# Patient Record
Sex: Male | Born: 1950 | Race: Black or African American | Hispanic: No | Smoking: Former smoker
Health system: Southern US, Community
[De-identification: ages and names within clinical notes are randomized; demographics above are authoritative.]

## PROBLEM LIST (undated history)

## (undated) DIAGNOSIS — F1910 Other psychoactive substance abuse, uncomplicated: Secondary | ICD-10-CM

## (undated) DIAGNOSIS — K635 Polyp of colon: Secondary | ICD-10-CM

## (undated) DIAGNOSIS — I351 Nonrheumatic aortic (valve) insufficiency: Secondary | ICD-10-CM

## (undated) DIAGNOSIS — I639 Cerebral infarction, unspecified: Secondary | ICD-10-CM

## (undated) DIAGNOSIS — I10 Essential (primary) hypertension: Secondary | ICD-10-CM

## (undated) DIAGNOSIS — I219 Acute myocardial infarction, unspecified: Secondary | ICD-10-CM

## (undated) DIAGNOSIS — E785 Hyperlipidemia, unspecified: Secondary | ICD-10-CM

## (undated) DIAGNOSIS — R7303 Prediabetes: Secondary | ICD-10-CM

## (undated) DIAGNOSIS — E119 Type 2 diabetes mellitus without complications: Secondary | ICD-10-CM

## (undated) DIAGNOSIS — J189 Pneumonia, unspecified organism: Secondary | ICD-10-CM

## (undated) HISTORY — PX: COLON SURGERY: SHX602

## (undated) HISTORY — DX: Nonrheumatic aortic (valve) insufficiency: I35.1

## (undated) HISTORY — DX: Other psychoactive substance abuse, uncomplicated: F19.10

## (undated) HISTORY — PX: TONSILLECTOMY: SUR1361

## (undated) HISTORY — DX: Hyperlipidemia, unspecified: E78.5

## (undated) HISTORY — DX: Essential (primary) hypertension: I10

## (undated) HISTORY — PX: CAROTID ENDARTERECTOMY: SUR193

## (undated) HISTORY — DX: Polyp of colon: K63.5

## (undated) HISTORY — DX: Acute myocardial infarction, unspecified: I21.9

---
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
## 2001-05-22 ENCOUNTER — Emergency Department (HOSPITAL_COMMUNITY): Admission: EM | Admit: 2001-05-22 | Discharge: 2001-05-22 | Payer: Self-pay | Admitting: *Deleted

## 2001-05-22 ENCOUNTER — Encounter: Payer: Self-pay | Admitting: *Deleted

## 2001-05-24 ENCOUNTER — Encounter: Payer: Self-pay | Admitting: Emergency Medicine

## 2001-05-24 ENCOUNTER — Emergency Department (HOSPITAL_COMMUNITY): Admission: EM | Admit: 2001-05-24 | Discharge: 2001-05-24 | Payer: Self-pay | Admitting: Emergency Medicine

## 2001-05-25 ENCOUNTER — Emergency Department (HOSPITAL_COMMUNITY): Admission: EM | Admit: 2001-05-25 | Discharge: 2001-05-25 | Payer: Self-pay | Admitting: Emergency Medicine

## 2001-09-09 ENCOUNTER — Emergency Department (HOSPITAL_COMMUNITY): Admission: EM | Admit: 2001-09-09 | Discharge: 2001-09-09 | Payer: Self-pay | Admitting: *Deleted

## 2001-09-21 ENCOUNTER — Emergency Department (HOSPITAL_COMMUNITY): Admission: EM | Admit: 2001-09-21 | Discharge: 2001-09-21 | Payer: Self-pay | Admitting: Emergency Medicine

## 2001-09-22 ENCOUNTER — Emergency Department (HOSPITAL_COMMUNITY): Admission: EM | Admit: 2001-09-22 | Discharge: 2001-09-22 | Payer: Self-pay | Admitting: Internal Medicine

## 2001-09-25 ENCOUNTER — Emergency Department (HOSPITAL_COMMUNITY): Admission: EM | Admit: 2001-09-25 | Discharge: 2001-09-25 | Payer: Self-pay | Admitting: Emergency Medicine

## 2001-10-07 ENCOUNTER — Emergency Department (HOSPITAL_COMMUNITY): Admission: EM | Admit: 2001-10-07 | Discharge: 2001-10-07 | Payer: Self-pay | Admitting: Emergency Medicine

## 2001-10-14 ENCOUNTER — Emergency Department (HOSPITAL_COMMUNITY): Admission: EM | Admit: 2001-10-14 | Discharge: 2001-10-14 | Payer: Self-pay | Admitting: Emergency Medicine

## 2001-10-24 ENCOUNTER — Emergency Department (HOSPITAL_COMMUNITY): Admission: EM | Admit: 2001-10-24 | Discharge: 2001-10-24 | Payer: Self-pay | Admitting: Internal Medicine

## 2001-11-12 ENCOUNTER — Emergency Department (HOSPITAL_COMMUNITY): Admission: EM | Admit: 2001-11-12 | Discharge: 2001-11-12 | Payer: Self-pay | Admitting: Emergency Medicine

## 2001-11-14 ENCOUNTER — Emergency Department (HOSPITAL_COMMUNITY): Admission: EM | Admit: 2001-11-14 | Discharge: 2001-11-14 | Payer: Self-pay | Admitting: Emergency Medicine

## 2001-12-15 ENCOUNTER — Encounter: Payer: Self-pay | Admitting: *Deleted

## 2001-12-15 ENCOUNTER — Emergency Department (HOSPITAL_COMMUNITY): Admission: EM | Admit: 2001-12-15 | Discharge: 2001-12-15 | Payer: Self-pay | Admitting: *Deleted

## 2001-12-25 ENCOUNTER — Emergency Department (HOSPITAL_COMMUNITY): Admission: EM | Admit: 2001-12-25 | Discharge: 2001-12-25 | Payer: Self-pay | Admitting: Internal Medicine

## 2002-01-16 ENCOUNTER — Emergency Department (HOSPITAL_COMMUNITY): Admission: EM | Admit: 2002-01-16 | Discharge: 2002-01-16 | Payer: Self-pay | Admitting: *Deleted

## 2002-01-16 ENCOUNTER — Encounter: Payer: Self-pay | Admitting: *Deleted

## 2002-01-29 ENCOUNTER — Emergency Department (HOSPITAL_COMMUNITY): Admission: EM | Admit: 2002-01-29 | Discharge: 2002-01-29 | Payer: Self-pay | Admitting: Internal Medicine

## 2002-01-29 ENCOUNTER — Encounter: Payer: Self-pay | Admitting: Internal Medicine

## 2002-02-06 ENCOUNTER — Emergency Department (HOSPITAL_COMMUNITY): Admission: EM | Admit: 2002-02-06 | Discharge: 2002-02-06 | Payer: Self-pay | Admitting: Emergency Medicine

## 2002-02-14 ENCOUNTER — Emergency Department (HOSPITAL_COMMUNITY): Admission: EM | Admit: 2002-02-14 | Discharge: 2002-02-14 | Payer: Self-pay | Admitting: Emergency Medicine

## 2002-02-26 ENCOUNTER — Emergency Department (HOSPITAL_COMMUNITY): Admission: EM | Admit: 2002-02-26 | Discharge: 2002-02-26 | Payer: Self-pay | Admitting: Internal Medicine

## 2002-03-19 ENCOUNTER — Emergency Department (HOSPITAL_COMMUNITY): Admission: EM | Admit: 2002-03-19 | Discharge: 2002-03-19 | Payer: Self-pay | Admitting: Emergency Medicine

## 2002-04-09 ENCOUNTER — Emergency Department (HOSPITAL_COMMUNITY): Admission: EM | Admit: 2002-04-09 | Discharge: 2002-04-09 | Payer: Self-pay | Admitting: *Deleted

## 2002-04-17 ENCOUNTER — Emergency Department (HOSPITAL_COMMUNITY): Admission: EM | Admit: 2002-04-17 | Discharge: 2002-04-17 | Payer: Self-pay | Admitting: Internal Medicine

## 2002-04-17 ENCOUNTER — Encounter: Payer: Self-pay | Admitting: Internal Medicine

## 2002-07-09 ENCOUNTER — Emergency Department (HOSPITAL_COMMUNITY): Admission: EM | Admit: 2002-07-09 | Discharge: 2002-07-09 | Payer: Self-pay | Admitting: Internal Medicine

## 2002-07-14 ENCOUNTER — Emergency Department (HOSPITAL_COMMUNITY): Admission: EM | Admit: 2002-07-14 | Discharge: 2002-07-14 | Payer: Self-pay | Admitting: Emergency Medicine

## 2002-07-19 ENCOUNTER — Emergency Department (HOSPITAL_COMMUNITY): Admission: EM | Admit: 2002-07-19 | Discharge: 2002-07-19 | Payer: Self-pay | Admitting: *Deleted

## 2002-08-04 ENCOUNTER — Emergency Department (HOSPITAL_COMMUNITY): Admission: EM | Admit: 2002-08-04 | Discharge: 2002-08-04 | Payer: Self-pay | Admitting: *Deleted

## 2002-08-16 ENCOUNTER — Emergency Department (HOSPITAL_COMMUNITY): Admission: EM | Admit: 2002-08-16 | Discharge: 2002-08-16 | Payer: Self-pay | Admitting: Emergency Medicine

## 2002-08-18 ENCOUNTER — Emergency Department (HOSPITAL_COMMUNITY): Admission: EM | Admit: 2002-08-18 | Discharge: 2002-08-18 | Payer: Self-pay | Admitting: Emergency Medicine

## 2002-08-24 ENCOUNTER — Emergency Department (HOSPITAL_COMMUNITY): Admission: EM | Admit: 2002-08-24 | Discharge: 2002-08-24 | Payer: Self-pay | Admitting: Emergency Medicine

## 2002-09-03 ENCOUNTER — Emergency Department (HOSPITAL_COMMUNITY): Admission: EM | Admit: 2002-09-03 | Discharge: 2002-09-03 | Payer: Self-pay | Admitting: *Deleted

## 2002-09-06 ENCOUNTER — Emergency Department (HOSPITAL_COMMUNITY): Admission: EM | Admit: 2002-09-06 | Discharge: 2002-09-06 | Payer: Self-pay | Admitting: *Deleted

## 2002-09-09 ENCOUNTER — Emergency Department (HOSPITAL_COMMUNITY): Admission: EM | Admit: 2002-09-09 | Discharge: 2002-09-09 | Payer: Self-pay | Admitting: Emergency Medicine

## 2002-09-10 ENCOUNTER — Emergency Department (HOSPITAL_COMMUNITY): Admission: EM | Admit: 2002-09-10 | Discharge: 2002-09-10 | Payer: Self-pay

## 2002-09-13 ENCOUNTER — Emergency Department (HOSPITAL_COMMUNITY): Admission: EM | Admit: 2002-09-13 | Discharge: 2002-09-13 | Payer: Self-pay | Admitting: Emergency Medicine

## 2002-10-11 ENCOUNTER — Emergency Department (HOSPITAL_COMMUNITY): Admission: EM | Admit: 2002-10-11 | Discharge: 2002-10-11 | Payer: Self-pay | Admitting: *Deleted

## 2002-10-12 ENCOUNTER — Emergency Department (HOSPITAL_COMMUNITY): Admission: EM | Admit: 2002-10-12 | Discharge: 2002-10-12 | Payer: Self-pay | Admitting: *Deleted

## 2002-10-14 ENCOUNTER — Emergency Department (HOSPITAL_COMMUNITY): Admission: EM | Admit: 2002-10-14 | Discharge: 2002-10-14 | Payer: Self-pay | Admitting: *Deleted

## 2002-11-05 ENCOUNTER — Emergency Department (HOSPITAL_COMMUNITY): Admission: EM | Admit: 2002-11-05 | Discharge: 2002-11-05 | Payer: Self-pay | Admitting: *Deleted

## 2002-11-07 ENCOUNTER — Emergency Department (HOSPITAL_COMMUNITY): Admission: EM | Admit: 2002-11-07 | Discharge: 2002-11-07 | Payer: Self-pay | Admitting: Emergency Medicine

## 2002-11-07 ENCOUNTER — Encounter: Payer: Self-pay | Admitting: Emergency Medicine

## 2002-11-08 ENCOUNTER — Emergency Department (HOSPITAL_COMMUNITY): Admission: EM | Admit: 2002-11-08 | Discharge: 2002-11-08 | Payer: Self-pay | Admitting: Emergency Medicine

## 2002-11-09 ENCOUNTER — Emergency Department (HOSPITAL_COMMUNITY): Admission: EM | Admit: 2002-11-09 | Discharge: 2002-11-09 | Payer: Self-pay | Admitting: Emergency Medicine

## 2002-11-13 ENCOUNTER — Emergency Department (HOSPITAL_COMMUNITY): Admission: EM | Admit: 2002-11-13 | Discharge: 2002-11-13 | Payer: Self-pay | Admitting: Emergency Medicine

## 2002-12-14 ENCOUNTER — Emergency Department (HOSPITAL_COMMUNITY): Admission: EM | Admit: 2002-12-14 | Discharge: 2002-12-15 | Payer: Self-pay | Admitting: *Deleted

## 2003-01-21 ENCOUNTER — Emergency Department (HOSPITAL_COMMUNITY): Admission: EM | Admit: 2003-01-21 | Discharge: 2003-01-21 | Payer: Self-pay | Admitting: Emergency Medicine

## 2003-01-21 ENCOUNTER — Emergency Department (HOSPITAL_COMMUNITY): Admission: EM | Admit: 2003-01-21 | Discharge: 2003-01-22 | Payer: Self-pay | Admitting: Emergency Medicine

## 2003-02-01 ENCOUNTER — Emergency Department (HOSPITAL_COMMUNITY): Admission: EM | Admit: 2003-02-01 | Discharge: 2003-02-01 | Payer: Self-pay | Admitting: Internal Medicine

## 2003-03-04 ENCOUNTER — Emergency Department (HOSPITAL_COMMUNITY): Admission: EM | Admit: 2003-03-04 | Discharge: 2003-03-04 | Payer: Self-pay | Admitting: *Deleted

## 2003-03-08 ENCOUNTER — Emergency Department (HOSPITAL_COMMUNITY): Admission: EM | Admit: 2003-03-08 | Discharge: 2003-03-08 | Payer: Self-pay | Admitting: Emergency Medicine

## 2003-03-09 ENCOUNTER — Emergency Department (HOSPITAL_COMMUNITY): Admission: EM | Admit: 2003-03-09 | Discharge: 2003-03-09 | Payer: Self-pay | Admitting: Emergency Medicine

## 2003-03-11 ENCOUNTER — Emergency Department (HOSPITAL_COMMUNITY): Admission: EM | Admit: 2003-03-11 | Discharge: 2003-03-11 | Payer: Self-pay | Admitting: Emergency Medicine

## 2003-03-15 ENCOUNTER — Emergency Department (HOSPITAL_COMMUNITY): Admission: EM | Admit: 2003-03-15 | Discharge: 2003-03-15 | Payer: Self-pay | Admitting: *Deleted

## 2003-04-02 ENCOUNTER — Emergency Department (HOSPITAL_COMMUNITY): Admission: EM | Admit: 2003-04-02 | Discharge: 2003-04-02 | Payer: Self-pay | Admitting: Emergency Medicine

## 2003-04-03 ENCOUNTER — Emergency Department (HOSPITAL_COMMUNITY): Admission: EM | Admit: 2003-04-03 | Discharge: 2003-04-03 | Payer: Self-pay | Admitting: *Deleted

## 2003-04-23 ENCOUNTER — Emergency Department (HOSPITAL_COMMUNITY): Admission: EM | Admit: 2003-04-23 | Discharge: 2003-04-24 | Payer: Self-pay | Admitting: *Deleted

## 2003-04-28 ENCOUNTER — Emergency Department (HOSPITAL_COMMUNITY): Admission: EM | Admit: 2003-04-28 | Discharge: 2003-04-28 | Payer: Self-pay | Admitting: Emergency Medicine

## 2003-05-21 ENCOUNTER — Emergency Department (HOSPITAL_COMMUNITY): Admission: EM | Admit: 2003-05-21 | Discharge: 2003-05-21 | Payer: Self-pay | Admitting: Emergency Medicine

## 2003-05-24 ENCOUNTER — Emergency Department (HOSPITAL_COMMUNITY): Admission: EM | Admit: 2003-05-24 | Discharge: 2003-05-25 | Payer: Self-pay | Admitting: Emergency Medicine

## 2003-05-29 ENCOUNTER — Emergency Department (HOSPITAL_COMMUNITY): Admission: EM | Admit: 2003-05-29 | Discharge: 2003-05-29 | Payer: Self-pay | Admitting: *Deleted

## 2003-05-30 ENCOUNTER — Emergency Department (HOSPITAL_COMMUNITY): Admission: EM | Admit: 2003-05-30 | Discharge: 2003-05-31 | Payer: Self-pay | Admitting: *Deleted

## 2003-06-06 ENCOUNTER — Emergency Department (HOSPITAL_COMMUNITY): Admission: EM | Admit: 2003-06-06 | Discharge: 2003-06-06 | Payer: Self-pay | Admitting: Emergency Medicine

## 2003-06-27 ENCOUNTER — Emergency Department (HOSPITAL_COMMUNITY): Admission: EM | Admit: 2003-06-27 | Discharge: 2003-06-27 | Payer: Self-pay | Admitting: Emergency Medicine

## 2003-06-29 ENCOUNTER — Emergency Department (HOSPITAL_COMMUNITY): Admission: EM | Admit: 2003-06-29 | Discharge: 2003-06-29 | Payer: Self-pay | Admitting: *Deleted

## 2003-07-15 ENCOUNTER — Emergency Department (HOSPITAL_COMMUNITY): Admission: EM | Admit: 2003-07-15 | Discharge: 2003-07-15 | Payer: Self-pay | Admitting: Emergency Medicine

## 2003-07-17 ENCOUNTER — Emergency Department (HOSPITAL_COMMUNITY): Admission: EM | Admit: 2003-07-17 | Discharge: 2003-07-17 | Payer: Self-pay

## 2003-07-17 ENCOUNTER — Emergency Department (HOSPITAL_COMMUNITY): Admission: EM | Admit: 2003-07-17 | Discharge: 2003-07-18 | Payer: Self-pay | Admitting: *Deleted

## 2003-07-29 ENCOUNTER — Emergency Department (HOSPITAL_COMMUNITY): Admission: EM | Admit: 2003-07-29 | Discharge: 2003-07-29 | Payer: Self-pay | Admitting: *Deleted

## 2003-08-04 ENCOUNTER — Emergency Department (HOSPITAL_COMMUNITY): Admission: EM | Admit: 2003-08-04 | Discharge: 2003-08-05 | Payer: Self-pay | Admitting: Emergency Medicine

## 2003-08-05 ENCOUNTER — Emergency Department (HOSPITAL_COMMUNITY): Admission: EM | Admit: 2003-08-05 | Discharge: 2003-08-05 | Payer: Self-pay | Admitting: Emergency Medicine

## 2003-08-08 ENCOUNTER — Emergency Department (HOSPITAL_COMMUNITY): Admission: EM | Admit: 2003-08-08 | Discharge: 2003-08-08 | Payer: Self-pay | Admitting: *Deleted

## 2003-08-13 ENCOUNTER — Emergency Department (HOSPITAL_COMMUNITY): Admission: EM | Admit: 2003-08-13 | Discharge: 2003-08-13 | Payer: Self-pay | Admitting: Emergency Medicine

## 2003-08-26 ENCOUNTER — Emergency Department (HOSPITAL_COMMUNITY): Admission: EM | Admit: 2003-08-26 | Discharge: 2003-08-26 | Payer: Self-pay | Admitting: Emergency Medicine

## 2003-08-27 ENCOUNTER — Emergency Department (HOSPITAL_COMMUNITY): Admission: EM | Admit: 2003-08-27 | Discharge: 2003-08-27 | Payer: Self-pay | Admitting: Emergency Medicine

## 2003-08-28 ENCOUNTER — Emergency Department (HOSPITAL_COMMUNITY): Admission: EM | Admit: 2003-08-28 | Discharge: 2003-08-28 | Payer: Self-pay | Admitting: Emergency Medicine

## 2003-09-03 ENCOUNTER — Emergency Department (HOSPITAL_COMMUNITY): Admission: EM | Admit: 2003-09-03 | Discharge: 2003-09-04 | Payer: Self-pay | Admitting: Emergency Medicine

## 2003-09-04 ENCOUNTER — Emergency Department (HOSPITAL_COMMUNITY): Admission: EM | Admit: 2003-09-04 | Discharge: 2003-09-04 | Payer: Self-pay | Admitting: Emergency Medicine

## 2003-09-06 ENCOUNTER — Emergency Department (HOSPITAL_COMMUNITY): Admission: EM | Admit: 2003-09-06 | Discharge: 2003-09-06 | Payer: Self-pay | Admitting: *Deleted

## 2003-09-10 ENCOUNTER — Inpatient Hospital Stay (HOSPITAL_COMMUNITY): Admission: AD | Admit: 2003-09-10 | Discharge: 2003-09-15 | Payer: Self-pay | Admitting: Psychiatry

## 2003-09-16 ENCOUNTER — Emergency Department (HOSPITAL_COMMUNITY): Admission: EM | Admit: 2003-09-16 | Discharge: 2003-09-16 | Payer: Self-pay | Admitting: *Deleted

## 2003-09-19 ENCOUNTER — Emergency Department (HOSPITAL_COMMUNITY): Admission: EM | Admit: 2003-09-19 | Discharge: 2003-09-19 | Payer: Self-pay | Admitting: Emergency Medicine

## 2003-09-26 ENCOUNTER — Emergency Department (HOSPITAL_COMMUNITY): Admission: EM | Admit: 2003-09-26 | Discharge: 2003-09-26 | Payer: Self-pay | Admitting: Emergency Medicine

## 2003-09-29 ENCOUNTER — Emergency Department (HOSPITAL_COMMUNITY): Admission: EM | Admit: 2003-09-29 | Discharge: 2003-09-30 | Payer: Self-pay | Admitting: Emergency Medicine

## 2003-09-29 ENCOUNTER — Emergency Department (HOSPITAL_COMMUNITY): Admission: EM | Admit: 2003-09-29 | Discharge: 2003-09-29 | Payer: Self-pay | Admitting: Emergency Medicine

## 2003-09-30 ENCOUNTER — Emergency Department (HOSPITAL_COMMUNITY): Admission: EM | Admit: 2003-09-30 | Discharge: 2003-10-01 | Payer: Self-pay | Admitting: Emergency Medicine

## 2003-10-02 ENCOUNTER — Emergency Department (HOSPITAL_COMMUNITY): Admission: EM | Admit: 2003-10-02 | Discharge: 2003-10-03 | Payer: Self-pay | Admitting: *Deleted

## 2003-10-03 ENCOUNTER — Emergency Department (HOSPITAL_COMMUNITY): Admission: EM | Admit: 2003-10-03 | Discharge: 2003-10-03 | Payer: Self-pay | Admitting: Emergency Medicine

## 2003-10-04 ENCOUNTER — Emergency Department (HOSPITAL_COMMUNITY): Admission: EM | Admit: 2003-10-04 | Discharge: 2003-10-04 | Payer: Self-pay | Admitting: Emergency Medicine

## 2003-10-07 ENCOUNTER — Emergency Department (HOSPITAL_COMMUNITY): Admission: EM | Admit: 2003-10-07 | Discharge: 2003-10-07 | Payer: Self-pay | Admitting: Emergency Medicine

## 2003-10-08 ENCOUNTER — Emergency Department (HOSPITAL_COMMUNITY): Admission: EM | Admit: 2003-10-08 | Discharge: 2003-10-08 | Payer: Self-pay | Admitting: Emergency Medicine

## 2003-10-08 ENCOUNTER — Emergency Department (HOSPITAL_COMMUNITY): Admission: EM | Admit: 2003-10-08 | Discharge: 2003-10-09 | Payer: Self-pay | Admitting: Emergency Medicine

## 2003-10-24 ENCOUNTER — Emergency Department (HOSPITAL_COMMUNITY): Admission: EM | Admit: 2003-10-24 | Discharge: 2003-10-24 | Payer: Self-pay | Admitting: Emergency Medicine

## 2003-10-25 ENCOUNTER — Emergency Department (HOSPITAL_COMMUNITY): Admission: EM | Admit: 2003-10-25 | Discharge: 2003-10-25 | Payer: Self-pay | Admitting: Emergency Medicine

## 2003-10-29 ENCOUNTER — Emergency Department (HOSPITAL_COMMUNITY): Admission: EM | Admit: 2003-10-29 | Discharge: 2003-10-30 | Payer: Self-pay | Admitting: *Deleted

## 2003-10-29 ENCOUNTER — Emergency Department (HOSPITAL_COMMUNITY): Admission: EM | Admit: 2003-10-29 | Discharge: 2003-10-29 | Payer: Self-pay | Admitting: *Deleted

## 2003-10-31 ENCOUNTER — Emergency Department (HOSPITAL_COMMUNITY): Admission: EM | Admit: 2003-10-31 | Discharge: 2003-10-31 | Payer: Self-pay | Admitting: Emergency Medicine

## 2003-11-01 ENCOUNTER — Emergency Department (HOSPITAL_COMMUNITY): Admission: EM | Admit: 2003-11-01 | Discharge: 2003-11-01 | Payer: Self-pay | Admitting: *Deleted

## 2003-11-02 ENCOUNTER — Emergency Department (HOSPITAL_COMMUNITY): Admission: EM | Admit: 2003-11-02 | Discharge: 2003-11-02 | Payer: Self-pay | Admitting: *Deleted

## 2003-11-02 ENCOUNTER — Emergency Department (HOSPITAL_COMMUNITY): Admission: EM | Admit: 2003-11-02 | Discharge: 2003-11-03 | Payer: Self-pay | Admitting: Emergency Medicine

## 2003-11-03 ENCOUNTER — Emergency Department (HOSPITAL_COMMUNITY): Admission: EM | Admit: 2003-11-03 | Discharge: 2003-11-03 | Payer: Self-pay | Admitting: Emergency Medicine

## 2003-11-05 ENCOUNTER — Emergency Department (HOSPITAL_COMMUNITY): Admission: EM | Admit: 2003-11-05 | Discharge: 2003-11-05 | Payer: Self-pay | Admitting: Emergency Medicine

## 2003-11-12 ENCOUNTER — Emergency Department (HOSPITAL_COMMUNITY): Admission: EM | Admit: 2003-11-12 | Discharge: 2003-11-13 | Payer: Self-pay | Admitting: Emergency Medicine

## 2003-11-14 ENCOUNTER — Emergency Department (HOSPITAL_COMMUNITY): Admission: EM | Admit: 2003-11-14 | Discharge: 2003-11-14 | Payer: Self-pay | Admitting: *Deleted

## 2003-12-03 ENCOUNTER — Emergency Department (HOSPITAL_COMMUNITY): Admission: EM | Admit: 2003-12-03 | Discharge: 2003-12-03 | Payer: Self-pay | Admitting: Emergency Medicine

## 2003-12-04 ENCOUNTER — Emergency Department (HOSPITAL_COMMUNITY): Admission: EM | Admit: 2003-12-04 | Discharge: 2003-12-04 | Payer: Self-pay | Admitting: Emergency Medicine

## 2003-12-08 ENCOUNTER — Emergency Department (HOSPITAL_COMMUNITY): Admission: EM | Admit: 2003-12-08 | Discharge: 2003-12-09 | Payer: Self-pay | Admitting: *Deleted

## 2003-12-23 ENCOUNTER — Emergency Department (HOSPITAL_COMMUNITY): Admission: EM | Admit: 2003-12-23 | Discharge: 2003-12-23 | Payer: Self-pay | Admitting: Emergency Medicine

## 2003-12-24 ENCOUNTER — Emergency Department (HOSPITAL_COMMUNITY): Admission: EM | Admit: 2003-12-24 | Discharge: 2003-12-25 | Payer: Self-pay | Admitting: Emergency Medicine

## 2003-12-24 ENCOUNTER — Emergency Department (HOSPITAL_COMMUNITY): Admission: EM | Admit: 2003-12-24 | Discharge: 2003-12-24 | Payer: Self-pay | Admitting: Emergency Medicine

## 2004-01-05 ENCOUNTER — Emergency Department (HOSPITAL_COMMUNITY): Admission: EM | Admit: 2004-01-05 | Discharge: 2004-01-05 | Payer: Self-pay | Admitting: Emergency Medicine

## 2004-01-07 ENCOUNTER — Emergency Department (HOSPITAL_COMMUNITY): Admission: EM | Admit: 2004-01-07 | Discharge: 2004-01-07 | Payer: Self-pay | Admitting: Emergency Medicine

## 2004-01-08 ENCOUNTER — Emergency Department (HOSPITAL_COMMUNITY): Admission: EM | Admit: 2004-01-08 | Discharge: 2004-01-08 | Payer: Self-pay | Admitting: Emergency Medicine

## 2004-01-10 ENCOUNTER — Emergency Department (HOSPITAL_COMMUNITY): Admission: EM | Admit: 2004-01-10 | Discharge: 2004-01-10 | Payer: Self-pay | Admitting: *Deleted

## 2004-01-12 ENCOUNTER — Emergency Department (HOSPITAL_COMMUNITY): Admission: EM | Admit: 2004-01-12 | Discharge: 2004-01-13 | Payer: Self-pay | Admitting: Emergency Medicine

## 2004-01-13 ENCOUNTER — Emergency Department (HOSPITAL_COMMUNITY): Admission: EM | Admit: 2004-01-13 | Discharge: 2004-01-14 | Payer: Self-pay | Admitting: Emergency Medicine

## 2004-01-16 ENCOUNTER — Emergency Department (HOSPITAL_COMMUNITY): Admission: EM | Admit: 2004-01-16 | Discharge: 2004-01-17 | Payer: Self-pay | Admitting: *Deleted

## 2004-01-18 ENCOUNTER — Emergency Department (HOSPITAL_COMMUNITY): Admission: EM | Admit: 2004-01-18 | Discharge: 2004-01-19 | Payer: Self-pay | Admitting: Emergency Medicine

## 2004-01-20 ENCOUNTER — Emergency Department (HOSPITAL_COMMUNITY): Admission: EM | Admit: 2004-01-20 | Discharge: 2004-01-20 | Payer: Self-pay | Admitting: Emergency Medicine

## 2004-02-21 ENCOUNTER — Emergency Department (HOSPITAL_COMMUNITY): Admission: EM | Admit: 2004-02-21 | Discharge: 2004-02-21 | Payer: Self-pay | Admitting: Emergency Medicine

## 2004-02-29 ENCOUNTER — Ambulatory Visit (HOSPITAL_COMMUNITY): Admission: RE | Admit: 2004-02-29 | Discharge: 2004-02-29 | Payer: Self-pay | Admitting: General Surgery

## 2016-06-21 DIAGNOSIS — I639 Cerebral infarction, unspecified: Secondary | ICD-10-CM

## 2016-06-21 HISTORY — DX: Cerebral infarction, unspecified: I63.9

## 2016-06-24 ENCOUNTER — Emergency Department (HOSPITAL_COMMUNITY): Payer: Medicare HMO

## 2016-06-24 ENCOUNTER — Encounter (HOSPITAL_COMMUNITY): Payer: Self-pay | Admitting: *Deleted

## 2016-06-24 ENCOUNTER — Other Ambulatory Visit: Payer: Self-pay | Admitting: Internal Medicine

## 2016-06-24 ENCOUNTER — Inpatient Hospital Stay (HOSPITAL_COMMUNITY)
Admission: EM | Admit: 2016-06-24 | Discharge: 2016-06-26 | DRG: 065 | Disposition: A | Discharge status: Home Health | Payer: Medicare HMO | Attending: Internal Medicine | Admitting: Internal Medicine

## 2016-06-24 DIAGNOSIS — R42 Dizziness and giddiness: Secondary | ICD-10-CM | POA: Diagnosis not present

## 2016-06-24 DIAGNOSIS — Z823 Family history of stroke: Secondary | ICD-10-CM | POA: Diagnosis not present

## 2016-06-24 DIAGNOSIS — G464 Cerebellar stroke syndrome: Secondary | ICD-10-CM | POA: Diagnosis not present

## 2016-06-24 DIAGNOSIS — E785 Hyperlipidemia, unspecified: Secondary | ICD-10-CM | POA: Diagnosis not present

## 2016-06-24 DIAGNOSIS — I6529 Occlusion and stenosis of unspecified carotid artery: Secondary | ICD-10-CM

## 2016-06-24 DIAGNOSIS — Z87891 Personal history of nicotine dependence: Secondary | ICD-10-CM

## 2016-06-24 DIAGNOSIS — Z803 Family history of malignant neoplasm of breast: Secondary | ICD-10-CM

## 2016-06-24 DIAGNOSIS — I63541 Cerebral infarction due to unspecified occlusion or stenosis of right cerebellar artery: Principal | ICD-10-CM | POA: Diagnosis present

## 2016-06-24 DIAGNOSIS — Z8249 Family history of ischemic heart disease and other diseases of the circulatory system: Secondary | ICD-10-CM

## 2016-06-24 DIAGNOSIS — I6522 Occlusion and stenosis of left carotid artery: Secondary | ICD-10-CM | POA: Diagnosis not present

## 2016-06-24 DIAGNOSIS — I429 Cardiomyopathy, unspecified: Secondary | ICD-10-CM | POA: Diagnosis present

## 2016-06-24 DIAGNOSIS — I639 Cerebral infarction, unspecified: Secondary | ICD-10-CM

## 2016-06-24 DIAGNOSIS — I428 Other cardiomyopathies: Secondary | ICD-10-CM | POA: Diagnosis not present

## 2016-06-24 DIAGNOSIS — I6300 Cerebral infarction due to thrombosis of unspecified precerebral artery: Secondary | ICD-10-CM | POA: Diagnosis not present

## 2016-06-24 DIAGNOSIS — I10 Essential (primary) hypertension: Secondary | ICD-10-CM | POA: Diagnosis present

## 2016-06-24 DIAGNOSIS — I672 Cerebral atherosclerosis: Secondary | ICD-10-CM | POA: Diagnosis present

## 2016-06-24 DIAGNOSIS — I6789 Other cerebrovascular disease: Secondary | ICD-10-CM | POA: Diagnosis not present

## 2016-06-24 DIAGNOSIS — R51 Headache: Secondary | ICD-10-CM | POA: Diagnosis not present

## 2016-06-24 HISTORY — DX: Cerebral infarction, unspecified: I63.9

## 2016-06-24 LAB — URINALYSIS, ROUTINE W REFLEX MICROSCOPIC
BILIRUBIN URINE: NEGATIVE
GLUCOSE, UA: NEGATIVE mg/dL
HGB URINE DIPSTICK: NEGATIVE
KETONES UR: NEGATIVE mg/dL
Leukocytes, UA: NEGATIVE
Nitrite: NEGATIVE
PROTEIN: NEGATIVE mg/dL
Specific Gravity, Urine: 1.014 (ref 1.005–1.030)
pH: 5 (ref 5.0–8.0)

## 2016-06-24 LAB — BASIC METABOLIC PANEL
ANION GAP: 9 (ref 5–15)
BUN: 27 mg/dL — ABNORMAL HIGH (ref 6–20)
CALCIUM: 9.4 mg/dL (ref 8.9–10.3)
CO2: 24 mmol/L (ref 22–32)
CREATININE: 1.24 mg/dL (ref 0.61–1.24)
Chloride: 103 mmol/L (ref 101–111)
GFR calc Af Amer: >60 mL/min (ref >60)
GFR, EST NON AFRICAN AMERICAN: 59 mL/min — AB (ref >60)
GLUCOSE: 118 mg/dL — AB (ref 65–99)
Potassium: 4.2 mmol/L (ref 3.5–5.1)
Sodium: 136 mmol/L (ref 135–145)

## 2016-06-24 LAB — CBC WITH DIFFERENTIAL/PLATELET
BASOS ABS: 0.1 10*3/uL (ref 0.0–0.1)
BASOS PCT: 1 %
EOS PCT: 5 %
Eosinophils Absolute: 0.5 10*3/uL (ref 0.0–0.7)
HCT: 38.2 % — ABNORMAL LOW (ref 39.0–52.0)
Hemoglobin: 13.3 g/dL (ref 13.0–17.0)
LYMPHS PCT: 28 %
Lymphs Abs: 2.6 10*3/uL (ref 0.7–4.0)
MCH: 29.9 pg (ref 26.0–34.0)
MCHC: 34.8 g/dL (ref 30.0–36.0)
MCV: 85.8 fL (ref 78.0–100.0)
MONO ABS: 0.8 10*3/uL (ref 0.1–1.0)
MONOS PCT: 8 %
Neutro Abs: 5.6 10*3/uL (ref 1.7–7.7)
Neutrophils Relative %: 58 %
PLATELETS: 294 10*3/uL (ref 150–400)
RBC: 4.45 MIL/uL (ref 4.22–5.81)
RDW: 13 % (ref 11.5–15.5)
WBC: 9.5 10*3/uL (ref 4.0–10.5)

## 2016-06-24 LAB — GLUCOSE, CAPILLARY: GLUCOSE-CAPILLARY: 111 mg/dL — AB (ref 65–99)

## 2016-06-24 LAB — TROPONIN I: Troponin I: <0.03 ng/mL (ref <0.03)

## 2016-06-24 LAB — APTT: aPTT: 35 seconds (ref 24–36)

## 2016-06-24 LAB — RAPID URINE DRUG SCREEN, HOSP PERFORMED
AMPHETAMINES: NOT DETECTED
BARBITURATES: NOT DETECTED
BENZODIAZEPINES: NOT DETECTED
COCAINE: NOT DETECTED
Opiates: NOT DETECTED
TETRAHYDROCANNABINOL: NOT DETECTED

## 2016-06-24 LAB — TSH: TSH: 3.086 u[IU]/mL (ref 0.350–4.500)

## 2016-06-24 LAB — PROTIME-INR
INR: 0.93
Prothrombin Time: 12.5 seconds (ref 11.4–15.2)

## 2016-06-24 MED ORDER — SENNOSIDES-DOCUSATE SODIUM 8.6-50 MG PO TABS
1.0000 | ORAL_TABLET | Freq: Every evening | ORAL | Status: DC | PRN
  Administered 2016-06-26: 1 via ORAL
  Filled 2016-06-24: qty 1

## 2016-06-24 MED ORDER — ACETAMINOPHEN 325 MG PO TABS: 650.0000 mg | ORAL_TABLET | ORAL | Status: DC | PRN

## 2016-06-24 MED ORDER — INSULIN ASPART 100 UNIT/ML ~~LOC~~ SOLN: 0.0000 [IU] | Freq: Three times a day (TID) | SUBCUTANEOUS | Status: DC

## 2016-06-24 MED ORDER — ASPIRIN 325 MG PO TABS
325.0000 mg | ORAL_TABLET | Freq: Every day | ORAL | Status: DC
  Administered 2016-06-24 – 2016-06-26 (×3): 325 mg via ORAL
  Filled 2016-06-24 (×3): qty 1

## 2016-06-24 MED ORDER — INSULIN ASPART 100 UNIT/ML ~~LOC~~ SOLN: 0.0000 [IU] | Freq: Every day | SUBCUTANEOUS | Status: DC

## 2016-06-24 MED ORDER — ENOXAPARIN SODIUM 40 MG/0.4ML ~~LOC~~ SOLN
40.0000 mg | SUBCUTANEOUS | Status: DC
  Administered 2016-06-25 – 2016-06-26 (×2): 40 mg via SUBCUTANEOUS
  Filled 2016-06-24 (×2): qty 0.4

## 2016-06-24 MED ORDER — ACETAMINOPHEN 650 MG RE SUPP: 650.0000 mg | RECTAL | Status: DC | PRN

## 2016-06-24 MED ORDER — ASPIRIN 300 MG RE SUPP: 300.0000 mg | Freq: Every day | RECTAL | Status: DC

## 2016-06-24 MED ORDER — ACETAMINOPHEN 160 MG/5ML PO SOLN: 650.0000 mg | ORAL | Status: DC | PRN

## 2016-06-24 MED ORDER — SODIUM CHLORIDE 0.9 % IV SOLN
INTRAVENOUS | Status: DC
  Administered 2016-06-24 – 2016-06-25 (×2): via INTRAVENOUS

## 2016-06-24 MED ORDER — CLOPIDOGREL BISULFATE 75 MG PO TABS
75.0000 mg | ORAL_TABLET | Freq: Every day | ORAL | Status: DC
  Administered 2016-06-25 – 2016-06-26 (×3): 75 mg via ORAL
  Filled 2016-06-24 (×3): qty 1

## 2016-06-24 MED ORDER — ATORVASTATIN CALCIUM 40 MG PO TABS
40.0000 mg | ORAL_TABLET | Freq: Every day | ORAL | Status: DC
  Administered 2016-06-24 – 2016-06-26 (×3): 40 mg via ORAL
  Filled 2016-06-24 (×3): qty 1

## 2016-06-24 MED ORDER — STROKE: EARLY STAGES OF RECOVERY BOOK
Freq: Once | Status: AC
  Administered 2016-06-24: 23:00:00

## 2016-06-24 NOTE — Progress Notes (Signed)
Tried to called back to get report from the nurse, but the number will not connect to the RN. The number I received to call back is [phone].

## 2016-06-24 NOTE — ED Notes (Signed)
Report given to [facility] 

## 2016-06-24 NOTE — ED Provider Notes (Addendum)
[facility] DEPT Provider Note   CSN: [id number] Arrival date & time: 06/24/16  1309     History   Chief Complaint Chief Complaint  Patient presents with  . Dizziness    HPI Albert Morris is a 66 y.o. male. Complains of dizziness meaning feeling of off balance when he walks intermittently for the past 2 days. Symptoms last approximately 30 minutes this time and come every few hours he is presently asymptomatic. Other associated symptoms include mild headache above his right eyebrow. No visual changes no difficulty speaking. No tinnitus, nausea or vomiting No visual changes No lightheadedness. No weakness in arms or legs. Nothing makes symptoms better or worse. no treatment prior to coming here HPI  History reviewed. No pertinent past medical history. Past medical history negative. Patient has not seen a primary care physician in several years There are no active problems to display for this patient.   Past Surgical History:  Procedure Laterality Date  . [place]   growth removed  . TONSILLECTOMY         Home Medications    Prior to Admission medications   Not on File    Family History No family history on file.  Social History Social History  Substance Use Topics  . Smoking status: Never Smoker  . Smokeless tobacco: Never Used  . Alcohol use No  Ex-smoker quit 12 years ago no alcohol for several years no illicit drug use   Allergies   Patient has no known allergies.   Review of Systems Review of Systems  Constitutional: Negative.   HENT: Negative.   Respiratory: Positive for cough.        Chronic cough since stopped smoking 12 years ago  Cardiovascular: Negative.   Gastrointestinal: Negative.   Musculoskeletal: Negative.   Skin: Negative.   Neurological: Positive for dizziness and headaches.  Psychiatric/Behavioral: Negative.   All other systems reviewed and are negative.    Physical Exam Updated Vital Signs BP (!) 192/105    Pulse 77   Temp 98.2 F (36.8 C) (Oral)   Resp 16   Ht 6\' 2"  (1.88 m)   Wt 222 lb (100.7 kg)   SpO2 96%   BMI 28.50 kg/m   Physical Exam  Constitutional: He is oriented to person, place, and time. He appears well-developed and well-nourished.  HENT:  Head: Normocephalic and atraumatic.  Eyes: Conjunctivae are normal. Pupils are equal, round, and reactive to light.  Neck: Neck supple. No tracheal deviation present. No thyromegaly present.  Cardiovascular: Normal rate and regular rhythm.   No murmur heard. Pulmonary/Chest: Effort normal and breath sounds normal.  Abdominal: Soft. Bowel sounds are normal. He exhibits no distension. There is no tenderness.  Musculoskeletal: Normal range of motion. He exhibits no edema or tenderness.  Neurological: He is alert and oriented to person, place, and time. Coordination normal.  Gait normal Romberg normal pronator drift normal finger to nose normal DTR symmetric bilaterally at knee jerk ankle jerk and biceps toes or going bilaterally  Skin: Skin is warm and dry. No rash noted.  Psychiatric: He has a normal mood and affect.  Nursing note and vitals reviewed.    ED Treatments / Results  Labs (all labs ordered are listed, but only abnormal results are displayed) Labs Reviewed  CBC WITH DIFFERENTIAL/PLATELET - Abnormal; Notable for the following:       Result Value   HCT 38.2 (*)    All other components within normal limits  BASIC  METABOLIC PANEL - Abnormal; Notable for the following:    Glucose, Bld 118 (*)    BUN 27 (*)    GFR calc non Af Amer 59 (*)    All other components within normal limits  TROPONIN I    EKG  EKG Interpretation  Date/Time:  Tuesday Jun 24 2016 13:14:44 EDT Ventricular Rate:  87 PR Interval:  164 QRS Duration: 108 QT Interval:  372 QTC Calculation: 447 R Axis:   77 Text Interpretation:  Normal sinus rhythm Normal ECG No old tracing to compare Confirmed by Winfred Leeds  MD, Alyson Ki [phone]) on 06/24/2016 1:24:00  PM       Radiology No results found.  Procedures Procedures (including critical care time)  Medications Ordered in ED Medications - No data to display  Results for orders placed or performed during the hospital encounter of 06/24/16  CBC with Differential  Result Value Ref Range   WBC 9.5 4.0 - 10.5 K/uL   RBC 4.45 4.22 - 5.81 MIL/uL   Hemoglobin 13.3 13.0 - 17.0 g/dL   HCT 38.2 (L) 39.0 - 52.0 %   MCV 85.8 78.0 - 100.0 fL   MCH 29.9 26.0 - 34.0 pg   MCHC 34.8 30.0 - 36.0 g/dL   RDW 13.0 11.5 - 15.5 %   Platelets 294 150 - 400 K/uL   Neutrophils Relative % 58 %   Neutro Abs 5.6 1.7 - 7.7 K/uL   Lymphocytes Relative 28 %   Lymphs Abs 2.6 0.7 - 4.0 K/uL   Monocytes Relative 8 %   Monocytes Absolute 0.8 0.1 - 1.0 K/uL   Eosinophils Relative 5 %   Eosinophils Absolute 0.5 0.0 - 0.7 K/uL   Basophils Relative 1 %   Basophils Absolute 0.1 0.0 - 0.1 K/uL  Basic metabolic panel  Result Value Ref Range   Sodium 136 135 - 145 mmol/L   Potassium 4.2 3.5 - 5.1 mmol/L   Chloride 103 101 - 111 mmol/L   CO2 24 22 - 32 mmol/L   Glucose, Bld 118 (H) 65 - 99 mg/dL   BUN 27 (H) 6 - 20 mg/dL   Creatinine, Ser 1.24 0.61 - 1.24 mg/dL   Calcium 9.4 8.9 - 10.3 mg/dL   GFR calc non Af Amer 59 (L) >60 mL/min   GFR calc Af Amer >60 >60 mL/min   Anion gap 9 5 - 15  Troponin I  Result Value Ref Range   Troponin I <0.03 <0.03 ng/mL   Dg Chest 2 View  Result Date: 06/24/2016 CLINICAL DATA:  Dizziness for 2 days.  Imbalance. EXAM: CHEST  2 VIEW COMPARISON:  01/12/2004. FINDINGS: The heart size and mediastinal contours are within normal limits. Both lungs are clear. The visualized skeletal structures are unremarkable. Calcified tortuous aorta. IMPRESSION: No active cardiopulmonary disease.  Stable appearance from priors. Electronically Signed   By: Staci Righter M.D.   On: 06/24/2016 15:39   Mr Jodene Nam Head Wo Contrast  Result Date: 06/24/2016 CLINICAL DATA:  Dizziness and unsteady gait.   Right-sided headache. EXAM: MRI HEAD WITHOUT CONTRAST MRA HEAD WITHOUT CONTRAST TECHNIQUE: Multiplanar, multiecho pulse sequences of the brain and surrounding structures were obtained without intravenous contrast. Angiographic images of the head were obtained using MRA technique without contrast. The examination had to be discontinued prior to completion due to patient discomfort. No susceptibility weighted imaging or coronal T2 weighted imaging was obtained. COMPARISON:  None. FINDINGS: MRI HEAD FINDINGS Brain: There is a shallow sella. There are multiple  foci of diffusion restriction within the right cerebellum, within the PICA territory. No other diffusion restriction. There is hyperintense T2 weighted signal within the cerebellum at the site of the above-described diffusion restriction. There is mild multifocal hyperintense T2-weighted signal within the periventricular white matter, most often seen in the setting of chronic microvascular ischemia. No mass lesion. No hydrocephalus, age advanced atrophy or lobar predominant volume loss. No dural abnormality or extra-axial collection. Skull and upper cervical spine: Cervical spine is incompletely visualized but there appears to be moderate stenosis at the C3-C4 level. Sinuses/Orbits: No fluid levels or advanced mucosal thickening. No mastoid effusion. Normal orbits. MRA HEAD FINDINGS Intracranial internal carotid arteries: Symmetric bilateral narrowing of the internal carotid arteries just proximal to the skullbase is suspected to be artifactual. Otherwise, they are normal. Anterior cerebral arteries: Normal. Middle cerebral arteries: There is mild, diffuse narrowing of the left M1 segment. There is multifocal stenosis within the distal MCA distribution bilaterally. Posterior communicating arteries: Present bilaterally, larger on the right. Posterior cerebral arteries: There is multifocal moderate to severe narrowing of both P2 segments. Basilar artery: There is  multifocal narrowing of the basilar artery. Vertebral arteries: Left dominant. There is limited flow related enhancement seen within the diminutive right vertebral artery, which terminates as it gives rise to the PICA. The left vertebral artery is normal. Superior cerebellar arteries: Normal. Anterior inferior cerebellar arteries: Normal. Posterior inferior cerebellar arteries: The right PICA's flow related enhancement is lost just distal to its origin from the right vertebral artery. The left PICA is normal. IMPRESSION: 1. Multifocal acute ischemia within the right cerebellar hemisphere, located within the the right posterior inferior cerebellar artery territory. Mild edema, but no hemorrhage or mass effect. 2. 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 3. Extensive, multifocal intracranial atherosclerosis with moderate stenoses of multiple distal MCA branches, the basilar artery and the bilateral posterior cerebral artery P2 segments. 4. Incompletely visualized moderate spinal canal stenosis at C3-4. Consider further evaluation with MRI of the cervical spine, if clinically warranted. Electronically Signed   By: Ulyses Jarred M.D.   On: 06/24/2016 15:51   Mr Brain Wo Contrast  Result Date: 06/24/2016 CLINICAL DATA:  Dizziness and unsteady gait.  Right-sided headache. EXAM: MRI HEAD WITHOUT CONTRAST MRA HEAD WITHOUT CONTRAST TECHNIQUE: Multiplanar, multiecho pulse sequences of the brain and surrounding structures were obtained without intravenous contrast. Angiographic images of the head were obtained using MRA technique without contrast. The examination had to be discontinued prior to completion due to patient discomfort. No susceptibility weighted imaging or coronal T2 weighted imaging was obtained. COMPARISON:  None. FINDINGS: MRI HEAD FINDINGS Brain: There is a shallow sella. There are multiple foci of diffusion restriction within the right cerebellum, within the PICA territory. No other diffusion  restriction. There is hyperintense T2 weighted signal within the cerebellum at the site of the above-described diffusion restriction. There is mild multifocal hyperintense T2-weighted signal within the periventricular white matter, most often seen in the setting of chronic microvascular ischemia. No mass lesion. No hydrocephalus, age advanced atrophy or lobar predominant volume loss. No dural abnormality or extra-axial collection. Skull and upper cervical spine: Cervical spine is incompletely visualized but there appears to be moderate stenosis at the C3-C4 level. Sinuses/Orbits: No fluid levels or advanced mucosal thickening. No mastoid effusion. Normal orbits. MRA HEAD FINDINGS Intracranial internal carotid arteries: Symmetric bilateral narrowing of the internal carotid arteries just proximal to the skullbase is suspected to be artifactual. Otherwise, they are normal. Anterior cerebral arteries:  Normal. Middle cerebral arteries: There is mild, diffuse narrowing of the left M1 segment. There is multifocal stenosis within the distal MCA distribution bilaterally. Posterior communicating arteries: Present bilaterally, larger on the right. Posterior cerebral arteries: There is multifocal moderate to severe narrowing of both P2 segments. Basilar artery: There is multifocal narrowing of the basilar artery. Vertebral arteries: Left dominant. There is limited flow related enhancement seen within the diminutive right vertebral artery, which terminates as it gives rise to the PICA. The left vertebral artery is normal. Superior cerebellar arteries: Normal. Anterior inferior cerebellar arteries: Normal. Posterior inferior cerebellar arteries: The right PICA's flow related enhancement is lost just distal to its origin from the right vertebral artery. The left PICA is normal. IMPRESSION: 1. Multifocal acute ischemia within the right cerebellar hemisphere, located within the the right posterior inferior cerebellar artery  territory. Mild edema, but no hemorrhage or mass effect. 2. 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 3. Extensive, multifocal intracranial atherosclerosis with moderate stenoses of multiple distal MCA branches, the basilar artery and the bilateral posterior cerebral artery P2 segments. 4. Incompletely visualized moderate spinal canal stenosis at C3-4. Consider further evaluation with MRI of the cervical spine, if clinically warranted. Electronically Signed   By: Ulyses Jarred M.D.   On: 06/24/2016 15:51   Initial Impression / Assessment and Plan / ED Course  I have reviewed the triage vital signs and the nursing notes.  Pertinent labs & imaging results that were available during my care of the patient were reviewed by me and considered in my medical decision making (see chart for details).     Patient signed out to Dr. Dolly Rias 3:40 PM  Final Clinical Impressions(s) / ED Diagnoses  Diagnosis #1cerebellar stroke #2Elevated blood pressure Final diagnoses:  None    New Prescriptions New Prescriptions   No medications on file     Orlie Dakin, MD 06/24/16 [place], [place], MD 06/24/16 [phone]

## 2016-06-24 NOTE — ED Notes (Addendum)
Called 13M, RN not ready to rcv report, will return call

## 2016-06-24 NOTE — Consult Note (Signed)
Neurology Consultation Reason for Consult: Stroke Referring Physician: Mickey Farber  CC: Dizziness  History is obtained from: Patient  HPI: Albert Morris is a 66 y.o. male who has had 2 episodes of dizziness over the past couple of days. He states that the first last month 30 minutes, slightly longer today. He does not feel that he has hadn't any difficulty walking since that time.  He presented [facility] where an MRI was performed today which demonstrated right cerebellar ischemia. He was therefore transferred to [facility] for further evaluation.   LKW: 5/6 prior to bed tpa given?: no, outside of window    ROS: A 14 point ROS was performed and is negative except as noted in the HPI.   Past Medical History:  Diagnosis Date  . CVA (cerebral vascular accident) ([place]) 06/21/2016     Family History  Problem Relation Age of Onset  . Breast cancer Mother     She is 26 years old, in [place]  . CVA Mother   . CAD Mother   . Other Father     gunshot wound     Social History:  reports that he quit smoking about 12 years ago. He has a 9.00 pack-year smoking history. He has never used smokeless tobacco. He reports that he does not drink alcohol or use drugs.   Exam: Current vital signs: BP (!) 190/87 (BP Location: Right Arm)   Pulse 65   Temp 98.6 F (37 C) (Oral)   Resp 20   Ht 6\' 2"  (1.88 m)   Wt 98.6 kg (217 lb 4.8 oz)   SpO2 99%   BMI 27.90 kg/m  Vital signs in last 24 hours: Temp:  [98 F (36.7 C)-98.6 F (37 C)] 98.6 F (37 C) (05/08 2151) Pulse Rate:  [65-83] 65 (05/08 2151) Resp:  [16-20] 20 (05/08 2151) BP: (153-196)/(81-105) 190/87 (05/08 2151) SpO2:  [96 %-100 %] 99 % (05/08 2151) Weight:  [98.6 kg (217 lb 4.8 oz)-100.7 kg (222 lb)] 98.6 kg (217 lb 4.8 oz) (05/08 2151)   Physical Exam  Constitutional: Appears well-developed and well-nourished.  Psych: Affect appropriate to situation Eyes: No scleral injection HENT: No OP obstrucion Head:  Normocephalic.  Cardiovascular: Normal rate and regular rhythm.  Respiratory: Effort normal and breath sounds normal to anterior ascultation GI: Soft.  No distension. There is no tenderness.  Skin: WDI  Neuro: Mental Status: Patient is awake, alert, oriented to person, place, month, year, and situation. Patient is able to give a clear and coherent history. No signs of aphasia or neglect Cranial Nerves: II: Visual Fields are full. Pupils are equal, round, and reactive to light.   III,IV, VI: EOMI without ptosis or diploplia.  V: Facial sensation is symmetric to temperature VII: Facial movement is symmetric.  VIII: hearing is intact to voice X: Uvula elevates symmetrically XI: Shoulder shrug is symmetric. XII: tongue is midline without atrophy or fasciculations.  Motor: Tone is normal. Bulk is normal. 5/5 strength was present in all four extremities.  Sensory: Sensation is symmetric to light touch and temperature in the arms and legs. Deep Tendon Reflexes: 2+ and symmetric in the biceps and patellae.  Plantars: Toes are downgoing bilaterally.  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 I am not certain if his right PICA is truly occluded or if it is a severe stenosis. Given recurrent events, dual antiplatelet therapy may be indicated.  Recommendations: 1. HgbA1c, fasting lipid panel 2. CTA head and neck 3. Frequent neuro checks 4. Echocardiogram 5. Carotid dopplers are not needed given CTA 6. Prophylactic therapy-Antiplatelet med: Aspirin - dose 325mg  PO or 300mg  PR and plavix 75mg  7. Risk factor modification 8. Telemetry monitoring 9. PT consult, OT consult, Speech consult 10. please  page stroke NP  Or  PA  Or MD  from 8am -4 pm as this patient will be followed by the stroke team at this point.   You can look them up on [email or web address]      Roland Rack, MD [facility] [phone]  If 7pm- 7am, please page neurology on call as listed in [place].

## 2016-06-24 NOTE — H&P (Signed)
History and Physical    Albert Morris VFI:[id number] DOB: [birth year] DOA: 06/24/2016  PCP: Patient, No Pcp Per - has not seen a physician in years Consultants:  None Patient coming from: home - lives alone, not in contact with his children who live out of the area; NOK: cousin, Lindaann Slough, [phone]  Chief Complaint: dizziness  HPI: Albert Morris is a 66 y.o. male with no significant past medical history - has not seen a physician in many years.  Patient started feeling dizzy on Monday when he got up.  He also felt dizzy this AM when he got up.  Symptoms lasted about 30 minutes both days and went away completely.  He didn't have any balance, Monday felt like he was pulling to the left.  That also resolved within an hour and returned this AM and has resolved again.  No dysphagia, dysarthria.  Right foot with numbness and tingling which started yesterday AM and is ongoing.  Slight pain over right eye.  No chest pain.  No nausea.  No vision changes.   ED Course: CT with cerebellar stroke  Review of Systems: As per HPI; otherwise review of systems reviewed and negative.   Ambulatory Status:  ambulates without assistance  Past Medical History:  Diagnosis Date  . CVA (cerebral vascular accident) ([place]) 06/21/2016    Past Surgical History:  Procedure Laterality Date  . [place]   growth removed  . TONSILLECTOMY      Social History   Social History  . Marital status: Divorced    Spouse name: N/A  . Number of children: N/A  . Years of education: N/A   Occupational History  . retired    Social History Main Topics  . Smoking status: Former Smoker    Packs/day: 0.50    Years: 18.00    Quit date: 2006  . Smokeless tobacco: Never Used  . Alcohol use No     Comment: h/o heavy use  . Drug use: No     Comment: remote h/o heavy marijuana use, also used cocaine and others  . Sexual activity: Not on file   Other Topics Concern  . Not on file   Social  History Narrative  . No narrative on file    No Known Allergies  Family History  Problem Relation Age of Onset  . Breast cancer Mother     She is 38 years old, in [place]  . CVA Mother   . CAD Mother   . Other Father     gunshot wound    Prior to Admission medications   Not on File    Physical Exam: Vitals:   06/24/16 2000 06/24/16 2030 06/24/16 2105 06/24/16 2151  BP: (!) 166/86 (!) 156/83 (!) 153/93 (!) 190/87  Pulse: 74 74 74 65  Resp:   18 20  Temp:   98.5 F (36.9 C) 98.6 F (37 C)  TempSrc:   Oral Oral  SpO2: 97% 97% 97% 99%  Weight:    98.6 kg (217 lb 4.8 oz)  Height:    6\' 2"  (1.88 m)     General: Appears calm and comfortable and is NAD Eyes:  PERRL, EOMI, normal lids, iris ENT:  grossly normal hearing, lips & tongue, mmm Neck:  no LAD, masses or thyromegaly Cardiovascular:  RRR, no m/r/g. No LE edema.  Respiratory:  CTA bilaterally, no w/r/r. Normal respiratory effort. Abdomen:  soft, ntnd, NABS Skin:  no rash or induration seen on  limited exam Musculoskeletal:  grossly normal tone BUE/BLE, good ROM, no bony abnormality Psychiatric:  grossly normal mood and affect, speech fluent and appropriate, AOx3 Neurologic:  CN 2-12 grossly intact, moves all extremities in coordinated fashion, sensation intact  Labs on Admission: I have personally reviewed following labs and imaging studies  CBC:  Recent Labs Lab 06/24/16 1320  WBC 9.5  NEUTROABS 5.6  HGB 13.3  HCT 38.2*  MCV 85.8  PLT 539   Basic Metabolic Panel:  Recent Labs Lab 06/24/16 1320  NA 136  K 4.2  CL 103  CO2 24  GLUCOSE 118*  BUN 27*  CREATININE 1.24  CALCIUM 9.4   GFR: Estimated Creatinine Clearance: 68.1 mL/min (by C-G formula based on SCr of 1.24 mg/dL). Liver Function Tests: No results for input(s): AST, ALT, ALKPHOS, BILITOT, PROT, ALBUMIN in the last 168 hours. No results for input(s): LIPASE, AMYLASE in the last 168 hours. No results for input(s): AMMONIA in the last 168  hours. Coagulation Profile:  Recent Labs Lab 06/24/16 1330  INR 0.93   Cardiac Enzymes:  Recent Labs Lab 06/24/16 1320  TROPONINI <0.03   BNP (last 3 results) No results for input(s): PROBNP in the last 8760 hours. HbA1C: No results for input(s): HGBA1C in the last 72 hours. CBG: No results for input(s): GLUCAP in the last 168 hours. Lipid Profile: No results for input(s): CHOL, HDL, LDLCALC, TRIG, CHOLHDL, LDLDIRECT in the last 72 hours. Thyroid Function Tests: No results for input(s): TSH, T4TOTAL, FREET4, T3FREE, THYROIDAB in the last 72 hours. Anemia Panel: No results for input(s): VITAMINB12, FOLATE, FERRITIN, TIBC, IRON, RETICCTPCT in the last 72 hours. Urine analysis:    Component Value Date/Time   COLORURINE STRAW (A) 06/24/2016 1600   APPEARANCEUR CLEAR 06/24/2016 1600   LABSPEC 1.014 06/24/2016 1600   PHURINE 5.0 06/24/2016 1600   GLUCOSEU NEGATIVE 06/24/2016 1600   HGBUR NEGATIVE 06/24/2016 1600   BILIRUBINUR NEGATIVE 06/24/2016 1600   KETONESUR NEGATIVE 06/24/2016 1600   PROTEINUR NEGATIVE 06/24/2016 1600   NITRITE NEGATIVE 06/24/2016 1600   LEUKOCYTESUR NEGATIVE 06/24/2016 1600    Creatinine Clearance: Estimated Creatinine Clearance: 68.1 mL/min (by C-G formula based on SCr of 1.24 mg/dL).  Sepsis Labs: @LABRCNTIP (procalcitonin:4,lacticidven:4) )No results found for this or any previous visit (from the past 240 hour(s)).   Radiological Exams on Admission: Dg Chest 2 View  Result Date: 06/24/2016 CLINICAL DATA:  Dizziness for 2 days.  Imbalance. EXAM: CHEST  2 VIEW COMPARISON:  01/12/2004. FINDINGS: The heart size and mediastinal contours are within normal limits. Both lungs are clear. The visualized skeletal structures are unremarkable. Calcified tortuous aorta. IMPRESSION: No active cardiopulmonary disease.  Stable appearance from priors. Electronically Signed   By: Staci Righter M.D.   On: 06/24/2016 15:39   Mr Jodene Nam Head Wo Contrast  Result Date:  06/24/2016 CLINICAL DATA:  Dizziness and unsteady gait.  Right-sided headache. EXAM: MRI HEAD WITHOUT CONTRAST MRA HEAD WITHOUT CONTRAST TECHNIQUE: Multiplanar, multiecho pulse sequences of the brain and surrounding structures were obtained without intravenous contrast. Angiographic images of the head were obtained using MRA technique without contrast. The examination had to be discontinued prior to completion due to patient discomfort. No susceptibility weighted imaging or coronal T2 weighted imaging was obtained. COMPARISON:  None. FINDINGS: MRI HEAD FINDINGS Brain: There is a shallow sella. There are multiple foci of diffusion restriction within the right cerebellum, within the PICA territory. No other diffusion restriction. There is hyperintense T2 weighted signal within the cerebellum at the  site of the above-described diffusion restriction. There is mild multifocal hyperintense T2-weighted signal within the periventricular white matter, most often seen in the setting of chronic microvascular ischemia. No mass lesion. No hydrocephalus, age advanced atrophy or lobar predominant volume loss. No dural abnormality or extra-axial collection. Skull and upper cervical spine: Cervical spine is incompletely visualized but there appears to be moderate stenosis at the C3-C4 level. Sinuses/Orbits: No fluid levels or advanced mucosal thickening. No mastoid effusion. Normal orbits. MRA HEAD FINDINGS Intracranial internal carotid arteries: Symmetric bilateral narrowing of the internal carotid arteries just proximal to the skullbase is suspected to be artifactual. Otherwise, they are normal. Anterior cerebral arteries: Normal. Middle cerebral arteries: There is mild, diffuse narrowing of the left M1 segment. There is multifocal stenosis within the distal MCA distribution bilaterally. Posterior communicating arteries: Present bilaterally, larger on the right. Posterior cerebral arteries: There is multifocal moderate to severe  narrowing of both P2 segments. Basilar artery: There is multifocal narrowing of the basilar artery. Vertebral arteries: Left dominant. There is limited flow related enhancement seen within the diminutive right vertebral artery, which terminates as it gives rise to the PICA. The left vertebral artery is normal. Superior cerebellar arteries: Normal. Anterior inferior cerebellar arteries: Normal. Posterior inferior cerebellar arteries: The right PICA's flow related enhancement is lost just distal to its origin from the right vertebral artery. The left PICA is normal. IMPRESSION: 1. Multifocal acute ischemia within the right cerebellar hemisphere, located within the the right posterior inferior cerebellar artery territory. Mild edema, but no hemorrhage or mass effect. 2. 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 3. Extensive, multifocal intracranial atherosclerosis with moderate stenoses of multiple distal MCA branches, the basilar artery and the bilateral posterior cerebral artery P2 segments. 4. Incompletely visualized moderate spinal canal stenosis at C3-4. Consider further evaluation with MRI of the cervical spine, if clinically warranted. Electronically Signed   By: Ulyses Jarred M.D.   On: 06/24/2016 15:51   Mr Brain Wo Contrast  Result Date: 06/24/2016 CLINICAL DATA:  Dizziness and unsteady gait.  Right-sided headache. EXAM: MRI HEAD WITHOUT CONTRAST MRA HEAD WITHOUT CONTRAST TECHNIQUE: Multiplanar, multiecho pulse sequences of the brain and surrounding structures were obtained without intravenous contrast. Angiographic images of the head were obtained using MRA technique without contrast. The examination had to be discontinued prior to completion due to patient discomfort. No susceptibility weighted imaging or coronal T2 weighted imaging was obtained. COMPARISON:  None. FINDINGS: MRI HEAD FINDINGS Brain: There is a shallow sella. There are multiple foci of diffusion restriction within the right  cerebellum, within the PICA territory. No other diffusion restriction. There is hyperintense T2 weighted signal within the cerebellum at the site of the above-described diffusion restriction. There is mild multifocal hyperintense T2-weighted signal within the periventricular white matter, most often seen in the setting of chronic microvascular ischemia. No mass lesion. No hydrocephalus, age advanced atrophy or lobar predominant volume loss. No dural abnormality or extra-axial collection. Skull and upper cervical spine: Cervical spine is incompletely visualized but there appears to be moderate stenosis at the C3-C4 level. Sinuses/Orbits: No fluid levels or advanced mucosal thickening. No mastoid effusion. Normal orbits. MRA HEAD FINDINGS Intracranial internal carotid arteries: Symmetric bilateral narrowing of the internal carotid arteries just proximal to the skullbase is suspected to be artifactual. Otherwise, they are normal. Anterior cerebral arteries: Normal. Middle cerebral arteries: There is mild, diffuse narrowing of the left M1 segment. There is multifocal stenosis within the distal MCA distribution bilaterally. Posterior communicating arteries:  Present bilaterally, larger on the right. Posterior cerebral arteries: There is multifocal moderate to severe narrowing of both P2 segments. Basilar artery: There is multifocal narrowing of the basilar artery. Vertebral arteries: Left dominant. There is limited flow related enhancement seen within the diminutive right vertebral artery, which terminates as it gives rise to the PICA. The left vertebral artery is normal. Superior cerebellar arteries: Normal. Anterior inferior cerebellar arteries: Normal. Posterior inferior cerebellar arteries: The right PICA's flow related enhancement is lost just distal to its origin from the right vertebral artery. The left PICA is normal. IMPRESSION: 1. Multifocal acute ischemia within the right cerebellar hemisphere, located within  the the right posterior inferior cerebellar artery territory. Mild edema, but no hemorrhage or mass effect. 2. 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 3. Extensive, multifocal intracranial atherosclerosis with moderate stenoses of multiple distal MCA branches, the basilar artery and the bilateral posterior cerebral artery P2 segments. 4. Incompletely visualized moderate spinal canal stenosis at C3-4. Consider further evaluation with MRI of the cervical spine, if clinically warranted. Electronically Signed   By: Ulyses Jarred M.D.   On: 06/24/2016 15:51    EKG: Independently reviewed.  NSR with rate 87; no evidence of acute ischemia  Assessment/Plan Principal Problem:   CVA (cerebral vascular accident) ([place])   -Very mild symptoms with no appreciable deficits on exam -Markedly abnormal MRI/MRA with high concern for recurrent CVA -Will admit to [facility] for further CVA evaluation -Telemetry monitoring -Carotid dopplers -Echo -Risk stratification with FLP, A1c; will also check TSH and UDS (negative) -ASA daily -PT/OT/ST/Nutrition Consults -Neurology consultation - patient discussed with neuro by ER physician and will be paged to see patient upon arrival at [facility] -May also need neurosurgical and/or IR evaluation -Glucose 118 - will follow with daily labs and check A1c -BUN 24, Creatinine 1.24 - will follow with daily labs; it would not be surprisingly for patient to have CKD from chronic untreated HTN  HTN -Allow permissive HTN -Treat BP only if >220/120, and then with goal of 15% reduction  HLD -Check FLP -Will start Lipitor 40 mg empirically for now   DVT prophylaxis:  Lovenox  Code Status: Full - confirmed with patient; he WOULD NOT desire trach/PEG with long-term life support measures Family Communication: None present; he is estranged from his children and names a cousin as NOK Disposition Plan: To be determined Consults called: Neurology Admission status: Admit - It is my  clinical opinion that admission to INPATIENT is reasonable and necessary because this patient will require at least 2 midnights in the hospital to treat this condition based on the medical complexity of the problems presented.  Given the aforementioned information, the predictability of an adverse outcome is felt to be significant.    Karmen Bongo MD [facility]  If 7PM-7AM, please contact night-coverage [email or web address] Password [email or web address]  06/24/2016, 10:13 PM

## 2016-06-24 NOTE — ED Notes (Signed)
[facility] for transport to [place]

## 2016-06-24 NOTE — ED Notes (Signed)
[facility] arrived to transport pt 

## 2016-06-24 NOTE — ED Triage Notes (Signed)
Pt c/o dizziness x 2 days. Denies any other symptoms. Pt reports his balance is off.

## 2016-06-25 ENCOUNTER — Inpatient Hospital Stay (HOSPITAL_COMMUNITY): Payer: Medicare HMO

## 2016-06-25 DIAGNOSIS — I639 Cerebral infarction, unspecified: Secondary | ICD-10-CM

## 2016-06-25 DIAGNOSIS — I63541 Cerebral infarction due to unspecified occlusion or stenosis of right cerebellar artery: Principal | ICD-10-CM

## 2016-06-25 DIAGNOSIS — I6300 Cerebral infarction due to thrombosis of unspecified precerebral artery: Secondary | ICD-10-CM

## 2016-06-25 LAB — GLUCOSE, CAPILLARY
Glucose-Capillary: 115 mg/dL — ABNORMAL HIGH (ref 65–99)
Glucose-Capillary: 113 mg/dL — ABNORMAL HIGH (ref 65–99)
Glucose-Capillary: 89 mg/dL (ref 65–99)
Glucose-Capillary: 109 mg/dL — ABNORMAL HIGH (ref 65–99)

## 2016-06-25 LAB — LIPID PANEL
Cholesterol: 220 mg/dL — ABNORMAL HIGH (ref 0–200)
HDL: 40 mg/dL — ABNORMAL LOW (ref >40)
LDL CALC: 161 mg/dL — AB (ref 0–99)
Total CHOL/HDL Ratio: 5.5 RATIO
Triglycerides: 95 mg/dL (ref <150)
VLDL: 19 mg/dL (ref 0–40)

## 2016-06-25 MED ORDER — IOPAMIDOL (ISOVUE-370) INJECTION 76%
INTRAVENOUS | Status: AC
  Administered 2016-06-25: 50 mL
  Filled 2016-06-25: qty 50

## 2016-06-25 NOTE — Evaluation (Signed)
Physical Therapy Evaluation and Discharge Summary Patient Details Name: Albert Morris MRN: [id number] DOB: 07-08-1950 Today's Date: 06/25/2016   History of Present Illness  Pt is a 66 y/o male presenting to hospital secondary to two episodes of dizziness over the past couple of days; MRI completed and demonstrates right cerebellar ischemia. No pertinent PMH.  Clinical Impression  Pt presented sitting OOB in recliner chair, awake and willing to participate in therapy session. Prior to admission, pt reported that he was independent with all functional mobility and ADLs. Pt ambulated in hallway with supervision with no instability or LOB. Pt with mild memory deficits when thoroughly questioned. However, no further acute PT needs identified at this time. PT signing off.     Follow Up Recommendations Home health PT;Other (comment) (for one time evaluation for safety)    Equipment Recommendations  None recommended by PT    Recommendations for Other Services       Precautions / Restrictions Precautions Precautions: Fall Restrictions Weight Bearing Restrictions: No      Mobility  Bed Mobility Overal bed mobility: Needs Assistance Bed Mobility: Supine to Sit;Sit to Supine     Supine to sit: Supervision Sit to supine: Supervision   General bed mobility comments: pt sitting OOB in recliner chair when therapist entered  Transfers Overall transfer level: Needs assistance Equipment used: None Transfers: Sit to/from Stand Sit to Stand: Supervision         General transfer comment: no physical assistance needd  Ambulation/Gait Ambulation/Gait assistance: Supervision Ambulation Distance (Feet): 500 Feet Assistive device: None Gait Pattern/deviations: Step-through pattern;WFL(Within Functional Limits) Gait velocity: WFL Gait velocity interpretation: at or above normal speed for age/gender General Gait Details: no instability or LOB noted  Stairs             Wheelchair Mobility    Modified Rankin (Stroke Patients Only) Modified Rankin (Stroke Patients Only) Pre-Morbid Rankin Score: No symptoms Modified Rankin: No significant disability     Balance Overall balance assessment: Needs assistance Sitting-balance support: Feet supported Sitting balance-Leahy Scale: Normal     Standing balance support: During functional activity;No upper extremity supported Standing balance-Leahy Scale: Good                               Pertinent Vitals/Pain Pain Assessment: No/denies pain    Home Living Family/patient expects to be discharged to:: Private residence Living Arrangements: Alone Available Help at Discharge: Other (Comment) Type of Home: House Home Access: Stairs to enter Entrance Stairs-Rails: Right;Left;Can reach both Entrance Stairs-Number of Steps: 3 Home Layout: One level Home Equipment: None      Prior Function Level of Independence: Independent         Comments: drives, retired; completes financial and medication management      Hand Dominance   Dominant Hand: Right    Extremity/Trunk Assessment   Upper Extremity Assessment Upper Extremity Assessment: Defer to OT evaluation    Lower Extremity Assessment Lower Extremity Assessment: Overall WFL for tasks assessed    Cervical / Trunk Assessment Cervical / Trunk Assessment: Normal  Communication   Communication: No difficulties  Cognition Arousal/Alertness: Awake/alert Behavior During Therapy: WFL for tasks assessed/performed Overall Cognitive Status: Impaired/Different from baseline Area of Impairment: Memory                     Memory: Decreased short-term memory Following Commands: Follows multi-step commands inconsistently       General  Comments: requires verbal cues/reminders to recall and complete multi-step commands       General Comments      Exercises     Assessment/Plan    PT Assessment Patent does not need any  further PT services  PT Problem List         PT Treatment Interventions      PT Goals (Current goals can be found in the Care Plan section)  Acute Rehab PT Goals Patient Stated Goal: return home and remain independent    Frequency     Barriers to discharge        Co-evaluation               AM-PAC PT "6 Clicks" Daily Activity  Outcome Measure Difficulty turning over in bed (including adjusting bedclothes, sheets and blankets)?: None Difficulty moving from lying on back to sitting on the side of the bed? : None Difficulty sitting down on and standing up from a chair with arms (e.g., wheelchair, bedside commode, etc,.)?: None Help needed moving to and from a bed to chair (including a wheelchair)?: None Help needed walking in hospital room?: None Help needed climbing 3-5 steps with a railing? : A Little 6 Click Score: 23    End of Session Equipment Utilized During Treatment: Gait belt Activity Tolerance: Patient tolerated treatment well Patient left: in chair;with call bell/phone within reach Nurse Communication: Mobility status PT Visit Diagnosis: Other abnormalities of gait and mobility (R26.89);Other symptoms and signs involving the nervous system (R29.898)    Time: 0300-9233 PT Time Calculation (min) (ACUTE ONLY): 15 min   Charges:   PT Evaluation $PT Eval Low Complexity: 1 Procedure     PT G Codes:        Sherie Don, PT, DPT [place] 06/25/2016, 12:25 PM

## 2016-06-25 NOTE — Progress Notes (Addendum)
STROKE TEAM PROGRESS NOTE   HISTORY OF PRESENT ILLNESS (per record) Albert Morris is a 66 y.o. male who has had 2 episodes of dizziness over the past couple of days. He states that the first one lasted 30 minutes, slightly longer today. He does not feel that he has hadn't any difficulty walking since that time.  He presented [facility] where an MRI was performed which demonstrated right cerebellar ischemia. He was therefore transferred to [facility] for further evaluation.  He was LKW 06/22/16 prior to bed. Patient was not administered IV t-PA secondary to being outside of window. He was admitted for further evaluation and treatment.   SUBJECTIVE (INTERVAL HISTORY) OT is at beside working with pt. He stated that he did not smoke, he eats good and doing exercise. He stated that he is better but still feels leaning to the left on walking.    OBJECTIVE Temp:  [97.9 F (36.6 C)-98.6 F (37 C)] 97.9 F (36.6 C) (05/09 0758) Pulse Rate:  [65-83] 66 (05/09 0758) Cardiac Rhythm: Normal sinus rhythm (05/09 0700) Resp:  [16-20] 16 (05/09 0758) BP: (143-196)/(80-105) 161/96 (05/09 0758) SpO2:  [96 %-100 %] 98 % (05/09 0758) Weight:  [98.6 kg (217 lb 4.8 oz)-100.7 kg (222 lb)] 98.6 kg (217 lb 4.8 oz) (05/08 2151)  CBC:  Recent Labs Lab 06/24/16 1320  WBC 9.5  NEUTROABS 5.6  HGB 13.3  HCT 38.2*  MCV 85.8  PLT 409    Basic Metabolic Panel:  Recent Labs Lab 06/24/16 1320  NA 136  K 4.2  CL 103  CO2 24  GLUCOSE 118*  BUN 27*  CREATININE 1.24  CALCIUM 9.4    Lipid Panel:    Component Value Date/Time   CHOL 220 (H) 06/25/2016 0425   TRIG 95 06/25/2016 0425   HDL 40 (L) 06/25/2016 0425   CHOLHDL 5.5 06/25/2016 0425   VLDL 19 06/25/2016 0425   LDLCALC 161 (H) 06/25/2016 0425   HgbA1c: No results found for: HGBA1C Urine Drug Screen:    Component Value Date/Time   LABOPIA NONE DETECTED 06/24/2016 1600   COCAINSCRNUR NONE DETECTED 06/24/2016 1600   LABBENZ NONE  DETECTED 06/24/2016 1600   AMPHETMU NONE DETECTED 06/24/2016 1600   THCU NONE DETECTED 06/24/2016 1600   LABBARB NONE DETECTED 06/24/2016 1600    Alcohol Level No results found for: [place] I have personally reviewed the radiological images below and agree with the radiology interpretations.  CT head 06/25/2016 1. Foci of hypoattenuation in the right cerebellar hemisphere corresponds to acute infarcts on prior MRI. 2. No evidence for new large territory infarct, acute intracranial hemorrhage, or significant mass effect. 3. Mild chronic microvascular ischemic changes and mild parenchymal volume loss of the brain.   Ct Angio Head W Or Wo Contrast 06/25/2016 1. Severe stenosis/near occlusion of the right V3/V4 junction with severe atherosclerotic disease of the right V4 segment. No definite right PICA identified, either hypoplastic or occluded. 2. Severe mid basilar stenosis. 3. Calcified plaque of bilateral internal carotid arteries with moderate to severe right and moderate left paraclinoid stenosis. 4. 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 5. No large vessel occlusion or aneurysm of the circle of Willis identified.   Ct Angio Neck W Or Wo Contrast 06/25/2016 1. 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 2. Otherwise no large vessel occlusion, aneurysm, or significant stenosis of the carotid and vertebral arteries  of the neck is identified.  Mr Brain 53 Contrast Mr Albert Morris Head Wo Contrast 06/24/2016  1. Multifocal acute ischemia within the right cerebellar hemisphere, located within the the right posterior inferior cerebellar artery territory. Mild edema, but no hemorrhage or mass effect. 2. 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 3. Extensive, multifocal intracranial atherosclerosis with moderate stenoses of multiple distal MCA branches, the basilar artery and the bilateral posterior  cerebral artery P2 segments. 4. Incompletely visualized moderate spinal canal stenosis at C3-4. Consider further evaluation with MRI of the cervical spine, if clinically warranted.   TTE pending   PHYSICAL EXAM  Temp:  [97.9 F (36.6 C)-98.6 F (37 C)] 98.5 F (36.9 C) (05/09 1001) Pulse Rate:  [65-83] 72 (05/09 1001) Resp:  [15-20] 15 (05/09 1001) BP: (142-196)/(80-105) 142/81 (05/09 1001) SpO2:  [96 %-100 %] 98 % (05/09 1001) Weight:  [217 lb 4.8 oz (98.6 kg)-222 lb (100.7 kg)] 217 lb 4.8 oz (98.6 kg) (05/08 2151)  General - Well nourished, well developed, in no apparent distress.  Ophthalmologic - Sharp disc margins OU.   Cardiovascular - Regular rate and rhythm.  Mental Status -  Level of arousal and orientation to time, place, and person were intact. Language including expression, naming, repetition, comprehension was assessed and found intact. Fund of Knowledge was assessed and was intact.  Cranial Nerves II - XII - II - Visual field intact OU. III, IV, VI - Extraocular movements intact. V - Facial sensation intact bilaterally. VII - Facial movement intact bilaterally. VIII - Hearing & vestibular intact bilaterally. X - Palate elevates symmetrically. XI - Chin turning & shoulder shrug intact bilaterally. XII - Tongue protrusion intact.  Motor Strength - The patient's strength was normal in all extremities and pronator drift was absent.  Bulk was normal and fasciculations were absent.   Motor Tone - Muscle tone was assessed at the neck and appendages and was normal.  Reflexes - The patient's reflexes were 1+ in all extremities and he had no pathological reflexes.  Sensory - Light touch, temperature/pinprick were assessed and were symmetrical.    Coordination - The patient had normal movements in the hands and feet with no ataxia or dysmetria.  Tremor was absent.  Gait and Station - deferred.   ASSESSMENT/PLAN Albert Morris is a 66 y.o. male with no  significant past medical history presenting with dizziness. He did not receive IV t-PA due to being outside of the window.   Stroke:  Right cerebellar infarct small patchy in setting of severe intracranial and extracranial atherosclerosis  Resultant  No significant residue deficit  MRI  R cerebellar infarct.   MRA head R PICA occlusion vs stenosis. Extensive intracranial atherosclerosis. Moderate stenosis B siphons, B MCAs, BA and B P2s.   CT head right cerebellar hypoattenuation. Mild small vessel disease and atrophy  CTA head severe stenosis/near occlusion R V3/V4 junction. Severe stenosis R V4. R PICA not seen. Severe mid BA stenosis. Moderate to severe right and moderate left paraclinoid stenosis. Extensive atherosclerosis circle of Willis, greatest L M1 and R P2. Right VA origin stenosis  CTA neck L ICA 70-80% stenosis with mixed plaque.  2D Echo  pending  LDL 161  HgbA1c pending  UDS negative  Lovenox 40 mg sq daily for VTE prophylaxis  Diet heart healthy/carb modified Room service appropriate? Yes; Fluid consistency: Thin  No antithrombotic prior to admission, now on aspirin 325 mg daily and clopidogrel 75 mg daily. Continue DAPT for 3  months and then plavix alone due to severe intracranial stenosis.  Patient counseled to be compliant with his antithrombotic medications  Ongoing aggressive stroke risk factor management  Therapy recommendations:  Pending.   Disposition:  pending   Intracranial stenosis  Multi vessel involvement  severe stenosis/near occlusion R V3/V4 junction. Severe stenosis R V4.   Severe mid BA stenosis.   Moderate to severe right and moderate left paraclinoid stenosis.  L M1 and R P2.   Carotid stenosis  Left ICA 70-80% stenosis  Asymptomatic this time  Need outpt follow up with vascular surgery  Hypertension  No documented history of hypertension. On no meds prior to admission.   As high as 196/101   Stable this  am  Permissive hypertension (OK if < 220/120) but gradually normalize in 5-7 days  Long-term BP goal normotensive  Hyperlipidemia  Home meds:  No statin  LDL 161, goal < 70  Add lipitor 40  Continue statin at discharge  Other Stroke Risk Factors  Advanced age  Former Cigarette smoker   Hospital day # 1  Neurology will sign off. Please call with questions. Pt will follow up with carolyn Hassell Done NP at [facility] in about 6 weeks. Thanks for the consult.  Rosalin Hawking, MD PhD Stroke Neurology 06/25/2016 11:09 AM   To contact Stroke Continuity provider, please refer to [email or web address] After hours, contact General Neurology

## 2016-06-25 NOTE — Care Management Note (Signed)
Case Management Note  Patient Details  Name: Albert Morris MRN: [id number] Date of Birth: [birth year]  Subjective/Objective:    Pt admitted with CVA. He is from home alone. Pt does not have a PCP.                 Action/Plan: CM consulted for [facility] services. CM met with the patient and provided him a list of [facility] agencies. He selected [place]. [name] with [facility] notified and accepted the referral.  Pt asked to have a PCP in [place]. CM was able to obtain an appointment at Dr Thera Flake office in [place]. Patient in agreement with this PCP. Information placed on the AVS. Pt with orders for 3 in 1. Pt states he does not need this DME. CM following.   Expected Discharge Date:                  Expected Discharge Plan:  [place]  In-House Referral:     Discharge planning Services  CM Consult  Post Acute Care Choice:  Durable Medical Equipment, Home Health (pt refused 3 in 1) Choice offered to:  Patient  DME Arranged:    DME Agency:     HH Arranged:  PT, OT HH Agency:  [place]  Status of Service:  Completed, signed off  If discussed at [place] of Stay Meetings, dates discussed:    Additional Comments:  Pollie Friar, RN 06/25/2016, 2:39 PM

## 2016-06-25 NOTE — Progress Notes (Signed)
Patient's belongings money and driver's licence locked up in at the security office.

## 2016-06-25 NOTE — Progress Notes (Deleted)
RN came to room to assess patient, patient asleep. RN spoke with patient's mother, pt s mother requesting letter for court stating patient is in the hospital and cannot testify at court tomorrow, RN stated patient would be in hospital for another night, neurology wanted to record seizure like activity. Patient began twitching upper body, eyes closed, patient not responding to RN or patient's mother. EEG button pressed for recording. Activity lasted from 1506 to 1508. Patient's mother came to patient's side at beginning of seizure like activity at 1506, began crying and kissing patient, crying. Patient's bp 116/68, SaO2 remained at 100% for duration of seizure like activity, pulse 102. No bowel or bladder incontinence. md paged.

## 2016-06-25 NOTE — Evaluation (Signed)
Speech Language Pathology Evaluation Patient Details Name: Albert Morris MRN: [id number] DOB: 1950-02-21 Today's Date: 06/25/2016 Time: 8016-5537 SLP Time Calculation (min) (ACUTE ONLY): 27 min  Problem List:  Patient Active Problem List   Diagnosis Date Noted  . CVA (cerebral vascular accident) ([place]) 06/24/2016   Past Medical History:  Past Medical History:  Diagnosis Date  . CVA (cerebral vascular accident) ([place]) 06/21/2016   Past Surgical History:  Past Surgical History:  Procedure Laterality Date  . [place]   growth removed  . TONSILLECTOMY     HPI:  66 yo male adm to [facility] with dizziness and left sided weakness.  Pt found to have right cerebellar ischemia.  Pt PMH per chart + for ETOH, marijuana use, cocaine use, remote smoker, PSH + for tonsillectomy, colon surgery.     Assessment / Plan / Recommendation Clinical Impression  Pt with fluent speech and language with no focal CN deficits.  Of note, pt's voice is hoarse which he reports is baseline.  MOCA 7.2 administered with pt scoring 22/30 points indicating mild deficits.  Areas of strengths included attention, language, abstract thought and orientation.  Word recall was challenging for pt - pt recalled 0/5 words independently, 1/5 with category cue and recognized 4/5 from choice of three.  Pt reports there may be "some memory difficulties" but he attributes this to age related deficits.  SlP provided pt with memory compensation strategies - verbally and in writing for his use.  As all education completed, SLP will sign off.  If pt notes deficits in functional environment at home, [facility] SLP may be helpful but uncertain if he would desire to participate.      SLP Assessment  SLP Recommendation/Assessment: Patient does not need any further Speech Lanaguage Pathology Services    Follow Up Recommendations  None    Frequency and Duration   n/a        SLP Evaluation Cognition  Overall Cognitive Status:  Impaired/Different from baseline Arousal/Alertness: Awake/alert Orientation Level: Oriented X4 Attention: Focused;Sustained;Selective Focused Attention: Appears intact Sustained Attention: Appears intact Selective Attention: Appears intact Memory: Impaired Memory Impairment: Retrieval deficit (pt recalled 0/5 words independently, 1/5 with category cue and recognized 4/5 from choice of 3) Awareness: Appears intact Problem Solving: Impaired Problem Solving Impairment: Functional complex Safety/Judgment: Appears intact       Comprehension  Auditory Comprehension Overall Auditory Comprehension: Appears within functional limits for tasks assessed Yes/No Questions: Not tested Commands: Within Functional Limits Conversation: Complex Visual Recognition/Discrimination Discrimination: Within Function Limits Reading Comprehension Reading Status: Not tested    Expression Expression Primary Mode of Expression: Verbal Verbal Expression Overall Verbal Expression: Appears within functional limits for tasks assessed Initiation: No impairment Repetition: No impairment Naming: No impairment Pragmatics: No impairment Written Expression Dominant Hand: Right   Oral / Motor  Oral Motor/Sensory Function Overall Oral Motor/Sensory Function: Within functional limits Motor Speech Overall Motor Speech: Appears within functional limits for tasks assessed Respiration: Within functional limits Phonation: Low vocal intensity;Hoarse (pt reports this to be normal) Resonance: Within functional limits Articulation: Within functional limitis Intelligibility: Intelligible Motor Planning: Witnin functional limits   GO                    Claudie Fisherman, [facility] SLP [phone]

## 2016-06-25 NOTE — Progress Notes (Signed)
Nutrition Consult/Brief Note  RD consulted for CVA.  Wt Readings from Last 15 Encounters:  06/24/16 217 lb 4.8 oz (98.6 kg)   Body mass index is 27.9 kg/m. Patient meets criteria for Overweight based on current BMI.   Current diet order is Heart Healthy/Carbohydrate Modified.  Pt reports a good appetite.  Consumed 100% of breakfast this AM.  Labs and medications reviewed.  CBG's 111-115.  Nutrition focused physical exam completed.  No muscle or subcutaneous fat depletion noticed.  No nutrition interventions warranted at this time. If nutrition issues arise, please consult RD.   Arthur Holms, RD, LDN Pager #: [phone] After-Hours Pager #: [phone]

## 2016-06-25 NOTE — Progress Notes (Signed)
PT Cancellation Note  Patient Details Name: Albert Morris MRN: [id number] DOB: [birth year]   Cancelled Treatment:    Reason Eval/Treat Not Completed: Medical issues which prohibited therapy. Pt currently with bed rest orders in place. PT will continue to f/u with pt as activity orders are updated.   [place] 06/25/2016, 8:13 AM

## 2016-06-25 NOTE — Progress Notes (Signed)
OT Cancellation Note  Patient Details Name: Albert Morris MRN: [id number] DOB: 06-18-1950   Cancelled Treatment:    Reason Eval/Treat Not Completed: Other (comment). Pt on bedrest. Please update activity orders to initiate therapy.Thanks  [place], OT/L  [phone] 06/25/2016 06/25/2016, 8:18 AM

## 2016-06-25 NOTE — Evaluation (Addendum)
Occupational Therapy Evaluation Patient Details Name: Albert Morris MRN: [id number] DOB: February 04, 1951 Today's Date: 06/25/2016    History of Present Illness Pt is a 66 y/o male presenting to hospital secondary to two episodes of dizziness over the past couple of days; MRI completed and demonstrates right cerebellar ischemia. No pertinent PMH.   Clinical Impression   Pt is a 66 y/o M who presents with the above. Pt lives alone and at baseline completes all ADLs independently. Prior to admission Pt was still driving and completing his IADLs including cooking, financial and medication management. Pt is currently [employer] to supervision for completing ADLs.  Pt with some STM deficits noted during session, with increased difficulty completing multi-step directions. Pt will benefit from continued OT services to address cognition, increase safety and independence with ADLs, IADLs, and functional mobility. Goals are for independent to ModI.     Follow Up Recommendations  Home health OT    Equipment Recommendations  3 in 1 bedside commode           Precautions / Restrictions Precautions Precautions: Fall Restrictions Weight Bearing Restrictions: No      Mobility Bed Mobility Overal bed mobility: Needs Assistance Bed Mobility: Supine to Sit;Sit to Supine     Supine to sit: Supervision Sit to supine: Supervision   General bed mobility comments: pt sitting OOB in recliner chair when therapist entered  Transfers Overall transfer level: Needs assistance Equipment used: None Transfers: Sit to/from Stand Sit to Stand: Supervision         General transfer comment: no physical assistance needd    Balance Overall balance assessment: Needs assistance Sitting-balance support: Feet supported Sitting balance-Leahy Scale: Normal     Standing balance support: During functional activity;No upper extremity supported Standing balance-Leahy Scale: Good                    Standardized Balance Assessment Standardized Balance Assessment : Dynamic Gait Index   Dynamic Gait Index Level Surface: Normal Change in Gait Speed: Normal Gait with Horizontal Head Turns: Normal Gait with Vertical Head Turns: Normal Gait and Pivot Turn: Normal Step Over Obstacle: Normal Step Around Obstacles: Normal Steps: Normal Total Score: 24     ADL either performed or assessed with clinical judgement   ADL Overall ADL's : Needs assistance/impaired Eating/Feeding: Independent;Sitting   Grooming: Wash/dry hands;Supervision/safety;Standing   Upper Body Bathing: Supervision/ safety;Sitting   Lower Body Bathing: Min guard;Sit to/from stand   Upper Body Dressing : Set up;Sitting   Lower Body Dressing: Min guard;Sit to/from stand   Toilet Transfer: Min guard;Ambulation;Regular Museum/gallery exhibitions officer and Hygiene: Min guard;Sit to/from stand       Functional mobility during ADLs: Min guard General ADL Comments: Pt completed room and hallway level      Vision Baseline Vision/History: Wears glasses Wears Glasses: Reading only Eye Alignment: Within Functional Limits     Perception     Praxis      Pertinent Vitals/Pain Pain Assessment: No/denies pain     Hand Dominance Right   Extremity/Trunk Assessment Upper Extremity Assessment Upper Extremity Assessment: Defer to OT evaluation   Lower Extremity Assessment Lower Extremity Assessment: Overall WFL for tasks assessed   Cervical / Trunk Assessment Cervical / Trunk Assessment: Normal   Communication Communication Communication: No difficulties   Cognition Arousal/Alertness: Awake/alert Behavior During Therapy: WFL for tasks assessed/performed Overall Cognitive Status: Impaired/Different from baseline Area of Impairment: Memory  Memory: Decreased short-term memory Following Commands: Follows multi-step commands inconsistently       General Comments:  requires verbal cues/reminders to recall and complete multi-step commands    General Comments                  Home Living Family/patient expects to be discharged to:: Private residence Living Arrangements: Alone Available Help at Discharge: Other (Comment) Type of Home: House Home Access: Stairs to enter [employer] of Steps: 3 Entrance Stairs-Rails: Right;Left;Can reach both Home Layout: One level     Bathroom Shower/Tub: Corporate investment banker: Standard Bathroom Accessibility: Yes How Accessible: Accessible via walker Home Equipment: None      Lives With: Alone    Prior Functioning/Environment Level of Independence: Independent        Comments: drives, retired; completes Museum/gallery curator and medication management         OT Problem List: Decreased cognition      OT Treatment/Interventions: Cognitive remediation/compensation;Self-care/ADL training;Therapeutic activities    OT Goals(Current goals can be found in the care plan section) Acute Rehab OT Goals Patient Stated Goal: return home and remain independent OT Goal Formulation: With patient Time For Goal Achievement: 07/02/16 Potential to Achieve Goals: Good ADL Goals Pt Will Perform Lower Body Dressing: with modified independence;sit to/from stand Pt Will Perform Tub/Shower Transfer: Tub transfer;with modified independence;ambulating;3 in 1 Additional ADL Goal #1: Pt will independently identify three strategies for reducing risk of falls during ADL completion.  Additional ADL Goal #2: Pt will independently identifiy at least three strategies to compensate for STM deficits during ADL/IADL task completion.   OT Frequency: Min 2X/week   Barriers to D/C: Decreased caregiver support                        AM-PAC PT "6 Clicks" Daily Activity     Outcome Measure Help from another person eating meals?: None Help from another person taking care of personal grooming?:  None Help from another person toileting, which includes using toliet, bedpan, or urinal?: A Little Help from another person bathing (including washing, rinsing, drying)?: A Little Help from another person to put on and taking off regular upper body clothing?: None Help from another person to put on and taking off regular lower body clothing?: A Little 6 Click Score: 21   End of Session Equipment Utilized During Treatment: Gait belt Nurse Communication: Mobility status  Activity Tolerance: Patient tolerated treatment well Patient left: in chair;with call bell/phone within reach;with nursing/sitter in room (MD in room )  OT Visit Diagnosis: Other symptoms and signs involving cognitive function                Time: 5176-1607 OT Time Calculation (min): 39 min Charges:  OT General Charges $OT Visit: 1 Procedure OT Evaluation $OT Eval Moderate Complexity: 1 Procedure OT Treatments $Self Care/Home Management : 8-22 mins G-Codes:     Lou Cal, OT Pager [phone] 06/25/2016   Raymondo Band 06/25/2016, 1:50 PM

## 2016-06-25 NOTE — Progress Notes (Signed)
[facility] PROGRESS NOTE  Albert Morris DGL:[id number] DOB: 1950-04-17 DOA: 06/24/2016   PCP: Patient, No Pcp Per     Assessment/Plan: Principal Problem:   CVA (cerebral vascular accident) ([place])   66 y.o.malewho has had 2 episodes of dizziness over the past couple of days. He states that the first one lasted 30 minutes, slightly longer today. He does not feel that he has hadn't any difficulty walking since that time.  He presented [facility] where an MRI was performed which demonstrated right cerebellar ischemia. He was therefore transferred to [facility] for further evaluation.  Assessment and plan Stroke:  Right cerebellar infarct small patchy in setting of severe intracranial and extracranial atherosclerosis  Resultant  No significant residue deficit  MRI  R cerebellar infarct.   MRA head R PICA occlusion vs stenosis. Extensive intracranial atherosclerosis. Moderate stenosis B siphons, B MCAs, BA and B P2s.   CT head right cerebellar hypoattenuation. Mild small vessel disease and atrophy  CTA head severe stenosis/near occlusion R V3/V4 junction. Severe stenosis R V4. R PICA not seen. Severe mid BA stenosis. Moderate to severe right and moderate left paraclinoid stenosis. Extensive atherosclerosis circle of Willis, greatest L M1 and R P2. Right VA origin stenosis  CTA neck L ICA 70-80% stenosis with mixed plaque.  2D Echo  pending  LDL 161  HgbA1c pending  UDS negative  Lovenox 40 mg sq daily for VTE prophylaxis  Diet heart healthy/carb modified Room service appropriate? Yes; Fluid consistency: Thin  No antithrombotic prior to admission, now on aspirin 325 mg daily and clopidogrel 75 mg daily. Continue DAPT for 3 months and then plavix alone due to severe intracranial stenosis.  Home health recommended Pt will follow up with carolyn Hassell Done NP at [facility] in about 6 weeks    HTN -Allow permissive HTN -Treat BP only if >220/120, and then with goal  of 15% reduction  HLD LDL 161 Continue Lipitor     DVT prophylaxsis Lovenox  Code Status:  Full code   Family Communication: Discussed in detail with the patient, all imaging results, lab results explained to the patient   Disposition Plan:  Anticipate discharge in one to 2 days     Consultants:  Neurology  Procedures:  None  Antibiotics: Anti-infectives    None         HPI/Subjective: Still had episodes of imbalance while walking   Objective: Vitals:   06/25/16 0400 06/25/16 0600 06/25/16 0758 06/25/16 1001  BP: (!) 143/80 (!) 147/86 (!) 161/96 (!) 142/81  Pulse: 73 77 66 72  Resp:   16 15  Temp:   97.9 F (36.6 C) 98.5 F (36.9 C)  TempSrc:   Oral Oral  SpO2: 97% 99% 98% 98%  Weight:      Height:        Intake/Output Summary (Last 24 hours) at 06/25/16 1143 Last data filed at 06/25/16 0600  Gross per 24 hour  Intake              100 ml  Output                1 ml  Net               99 ml    Exam:  Examination:  General exam: Appears calm and comfortable  Respiratory system: Clear to auscultation. Respiratory effort normal. Cardiovascular system: S1 & S2 heard, RRR. No JVD, murmurs, rubs, gallops or clicks.  No pedal edema. Gastrointestinal system: Abdomen is nondistended, soft and nontender. No organomegaly or masses felt. Normal bowel sounds heard. Central nervous system: Alert and oriented. No focal neurological deficits. Extremities: Symmetric 5 x 5 power. Skin: No rashes, lesions or ulcers Psychiatry: Judgement and insight appear normal. Mood & affect appropriate.     Data Reviewed: I have personally reviewed following labs and imaging studies  Micro Results No results found for this or any previous visit (from the past 240 hour(s)).  Radiology Reports Ct Angio Head W Or Wo Contrast  Result Date: 06/25/2016 CLINICAL DATA:  66 y/o  M; stroke. EXAM: CT ANGIOGRAPHY HEAD AND NECK TECHNIQUE: Multidetector CT imaging of the head and  neck was performed using the standard protocol during bolus administration of intravenous contrast. Multiplanar CT image reconstructions and MIPs were obtained to evaluate the vascular anatomy. Carotid stenosis measurements (when applicable) are obtained utilizing NASCET criteria, using the distal internal carotid diameter as the denominator. CONTRAST:  50 cc Isovue 370 COMPARISON:  06/24/2016 MRI of the head and MRA of the head. FINDINGS: CT HEAD FINDINGS Brain: Small foci of hypoattenuation within the right cerebellar hemisphere correspond areas of infarction on the prior MRI of the brain. There is no evidence for new large territory infarct, acute intracranial hemorrhage, or focal mass effect. Scattered foci of hypoattenuation in periventricular and subcortical white matter are nonspecific but compatible with mild chronic microvascular ischemic changes. Mild diffuse brain parenchymal volume loss. Vascular: As below. Skull: Normal. Negative for fracture or focal lesion. Sinuses: Imaged portions are clear. Orbits: No acute finding. Review of the MIP images confirms the above findings CTA NECK FINDINGS Aortic arch: Bovine arch, normal variant. Mild calcific atherosclerosis of the arch. Right carotid system: No evidence of dissection, stenosis (50% or greater) or occlusion. Scattered calcified plaque of the common carotid artery and moderate mixed plaque of the carotid bifurcation without significant stenosis. Left carotid system: Moderate to severe mixed plaque of the left carotid bifurcation with severe proximal ICA 70-80% stenosis (series 9, image 161). Vertebral arteries: Left dominant. No evidence of dissection, stenosis (50% or greater) or occlusion. Skeleton: Mild to moderate cervical spondylosis. No high-grade bony canal stenosis. Multiple periapical cysts predominantly of the residual maxillary teeth compatible with odontogenic disease. Other neck: Negative. Upper chest: Negative. Review of the MIP images  confirms the above findings CTA HEAD FINDINGS Anterior circulation: Extensive calcified plaque of the ICA cavernous and paraclinoid segments with moderate to severe right and moderate left paraclinoid stenosis (series 9 image 96). Long segment of moderate left M1 stenosis. Bilateral A1, A2, and right M1 segment irregularity with areas of mild stenosis. Otherwise no large vessel occlusion, aneurysm, or significant stenosis is identified. Posterior circulation: Severe stenosis/ near occlusion of the right V3 4 junction (series 7, image 167) with lumen irregularity and multiple areas of stenosis of the residual right V4 segment. No definite right PICA identified, hypoplastic or occluded. Mild calcified plaque of the left V4 segment with areas of mild stenosis. Severe mid basilar stenosis. Diminutive right P1 segment with large right posterior communicating artery consistent with persistent fetal circulation. Bilateral P2 segments are irregular with multiple areas of mild-to-moderate left and up to severe right stenosis. Otherwise no large vessel occlusion, aneurysm, or significant stenosis is identified. Venous sinuses: As permitted by contrast timing, patent. Anatomic variants: Bilateral posterior communicating arteries and the anterior communicating artery are identified. Delayed phase: No abnormal intracranial enhancement. Review of the MIP images confirms the above findings IMPRESSION: CT  head: 1. Foci of hypoattenuation in the right cerebellar hemisphere corresponds to acute infarcts on prior MRI. 2. No evidence for new large territory infarct, acute intracranial hemorrhage, or significant mass effect. 3. Mild chronic microvascular ischemic changes and mild parenchymal volume loss of the brain. CTA neck: 1. 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 2. Otherwise no large vessel occlusion, aneurysm, or significant stenosis of the carotid and vertebral arteries of the neck is identified. CTA  head: 1. Severe stenosis/near occlusion of the right V3/V4 junction with severe atherosclerotic disease of the right V4 segment. No definite right PICA identified, either hypoplastic or occluded. 2. Severe mid basilar stenosis. 3. Calcified plaque of bilateral internal carotid arteries with moderate to severe right and moderate left paraclinoid stenosis. 4. 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 5. No large vessel occlusion or aneurysm of the circle of Willis identified. Electronically Signed   By: Kristine Garbe M.D.   On: 06/25/2016 02:03   Dg Chest 2 View  Result Date: 06/24/2016 CLINICAL DATA:  Dizziness for 2 days.  Imbalance. EXAM: CHEST  2 VIEW COMPARISON:  01/12/2004. FINDINGS: The heart size and mediastinal contours are within normal limits. Both lungs are clear. The visualized skeletal structures are unremarkable. Calcified tortuous aorta. IMPRESSION: No active cardiopulmonary disease.  Stable appearance from priors. Electronically Signed   By: Staci Righter M.D.   On: 06/24/2016 15:39   Ct Angio Neck W Or Wo Contrast  Result Date: 06/25/2016 CLINICAL DATA:  66 y/o  M; stroke. EXAM: CT ANGIOGRAPHY HEAD AND NECK TECHNIQUE: Multidetector CT imaging of the head and neck was performed using the standard protocol during bolus administration of intravenous contrast. Multiplanar CT image reconstructions and MIPs were obtained to evaluate the vascular anatomy. Carotid stenosis measurements (when applicable) are obtained utilizing NASCET criteria, using the distal internal carotid diameter as the denominator. CONTRAST:  50 cc Isovue 370 COMPARISON:  06/24/2016 MRI of the head and MRA of the head. FINDINGS: CT HEAD FINDINGS Brain: Small foci of hypoattenuation within the right cerebellar hemisphere correspond areas of infarction on the prior MRI of the brain. There is no evidence for new large territory infarct, acute  intracranial hemorrhage, or focal mass effect. Scattered foci of hypoattenuation in periventricular and subcortical white matter are nonspecific but compatible with mild chronic microvascular ischemic changes. Mild diffuse brain parenchymal volume loss. Vascular: As below. Skull: Normal. Negative for fracture or focal lesion. Sinuses: Imaged portions are clear. Orbits: No acute finding. Review of the MIP images confirms the above findings CTA NECK FINDINGS Aortic arch: Bovine arch, normal variant. Mild calcific atherosclerosis of the arch. Right carotid system: No evidence of dissection, stenosis (50% or greater) or occlusion. Scattered calcified plaque of the common carotid artery and moderate mixed plaque of the carotid bifurcation without significant stenosis. Left carotid system: Moderate to severe mixed plaque of the left carotid bifurcation with severe proximal ICA 70-80% stenosis (series 9, image 161). Vertebral arteries: Left dominant. No evidence of dissection, stenosis (50% or greater) or occlusion. Skeleton: Mild to moderate cervical spondylosis. No high-grade bony canal stenosis. Multiple periapical cysts predominantly of the residual maxillary teeth compatible with odontogenic disease. Other neck: Negative. Upper chest: Negative. Review of the MIP images confirms the above findings CTA HEAD FINDINGS Anterior circulation: Extensive calcified plaque of the ICA cavernous and paraclinoid segments with moderate to severe right and moderate left paraclinoid stenosis (series 9 image  96). Long segment of moderate left M1 stenosis. Bilateral A1, A2, and right M1 segment irregularity with areas of mild stenosis. Otherwise no large vessel occlusion, aneurysm, or significant stenosis is identified. Posterior circulation: Severe stenosis/ near occlusion of the right V3 4 junction (series 7, image 167) with lumen irregularity and multiple areas of stenosis of the residual right V4 segment. No definite right PICA  identified, hypoplastic or occluded. Mild calcified plaque of the left V4 segment with areas of mild stenosis. Severe mid basilar stenosis. Diminutive right P1 segment with large right posterior communicating artery consistent with persistent fetal circulation. Bilateral P2 segments are irregular with multiple areas of mild-to-moderate left and up to severe right stenosis. Otherwise no large vessel occlusion, aneurysm, or significant stenosis is identified. Venous sinuses: As permitted by contrast timing, patent. Anatomic variants: Bilateral posterior communicating arteries and the anterior communicating artery are identified. Delayed phase: No abnormal intracranial enhancement. Review of the MIP images confirms the above findings IMPRESSION: CT head: 1. Foci of hypoattenuation in the right cerebellar hemisphere corresponds to acute infarcts on prior MRI. 2. No evidence for new large territory infarct, acute intracranial hemorrhage, or significant mass effect. 3. Mild chronic microvascular ischemic changes and mild parenchymal volume loss of the brain. CTA neck: 1. 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 2. Otherwise no large vessel occlusion, aneurysm, or significant stenosis of the carotid and vertebral arteries of the neck is identified. CTA head: 1. Severe stenosis/near occlusion of the right V3/V4 junction with severe atherosclerotic disease of the right V4 segment. No definite right PICA identified, either hypoplastic or occluded. 2. Severe mid basilar stenosis. 3. Calcified plaque of bilateral internal carotid arteries with moderate to severe right and moderate left paraclinoid stenosis. 4. 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 5. No large vessel occlusion or aneurysm of the circle of Willis identified. Electronically Signed   By: Kristine Garbe M.D.   On: 06/25/2016 02:03   Mr Jodene Nam Head Wo  Contrast  Result Date: 06/24/2016 CLINICAL DATA:  Dizziness and unsteady gait.  Right-sided headache. EXAM: MRI HEAD WITHOUT CONTRAST MRA HEAD WITHOUT CONTRAST TECHNIQUE: Multiplanar, multiecho pulse sequences of the brain and surrounding structures were obtained without intravenous contrast. Angiographic images of the head were obtained using MRA technique without contrast. The examination had to be discontinued prior to completion due to patient discomfort. No susceptibility weighted imaging or coronal T2 weighted imaging was obtained. COMPARISON:  None. FINDINGS: MRI HEAD FINDINGS Brain: There is a shallow sella. There are multiple foci of diffusion restriction within the right cerebellum, within the PICA territory. No other diffusion restriction. There is hyperintense T2 weighted signal within the cerebellum at the site of the above-described diffusion restriction. There is mild multifocal hyperintense T2-weighted signal within the periventricular white matter, most often seen in the setting of chronic microvascular ischemia. No mass lesion. No hydrocephalus, age advanced atrophy or lobar predominant volume loss. No dural abnormality or extra-axial collection. Skull and upper cervical spine: Cervical spine is incompletely visualized but there appears to be moderate stenosis at the C3-C4 level. Sinuses/Orbits: No fluid levels or advanced mucosal thickening. No mastoid effusion. Normal orbits. MRA HEAD FINDINGS Intracranial internal carotid arteries: Symmetric bilateral narrowing of the internal carotid arteries just proximal to the skullbase is suspected to be artifactual. Otherwise, they are normal. Anterior cerebral arteries: Normal. Middle cerebral arteries: There is mild, diffuse narrowing of the left M1 segment.  There is multifocal stenosis within the distal MCA distribution bilaterally. Posterior communicating arteries: Present bilaterally, larger on the right. Posterior cerebral arteries: There is  multifocal moderate to severe narrowing of both P2 segments. Basilar artery: There is multifocal narrowing of the basilar artery. Vertebral arteries: Left dominant. There is limited flow related enhancement seen within the diminutive right vertebral artery, which terminates as it gives rise to the PICA. The left vertebral artery is normal. Superior cerebellar arteries: Normal. Anterior inferior cerebellar arteries: Normal. Posterior inferior cerebellar arteries: The right PICA's flow related enhancement is lost just distal to its origin from the right vertebral artery. The left PICA is normal. IMPRESSION: 1. Multifocal acute ischemia within the right cerebellar hemisphere, located within the the right posterior inferior cerebellar artery territory. Mild edema, but no hemorrhage or mass effect. 2. 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 3. Extensive, multifocal intracranial atherosclerosis with moderate stenoses of multiple distal MCA branches, the basilar artery and the bilateral posterior cerebral artery P2 segments. 4. Incompletely visualized moderate spinal canal stenosis at C3-4. Consider further evaluation with MRI of the cervical spine, if clinically warranted. Electronically Signed   By: Ulyses Jarred M.D.   On: 06/24/2016 15:51   Mr Brain Wo Contrast  Result Date: 06/24/2016 CLINICAL DATA:  Dizziness and unsteady gait.  Right-sided headache. EXAM: MRI HEAD WITHOUT CONTRAST MRA HEAD WITHOUT CONTRAST TECHNIQUE: Multiplanar, multiecho pulse sequences of the brain and surrounding structures were obtained without intravenous contrast. Angiographic images of the head were obtained using MRA technique without contrast. The examination had to be discontinued prior to completion due to patient discomfort. No susceptibility weighted imaging or coronal T2 weighted imaging was obtained. COMPARISON:  None. FINDINGS: MRI HEAD FINDINGS Brain: There is a shallow sella. There are multiple foci of diffusion  restriction within the right cerebellum, within the PICA territory. No other diffusion restriction. There is hyperintense T2 weighted signal within the cerebellum at the site of the above-described diffusion restriction. There is mild multifocal hyperintense T2-weighted signal within the periventricular white matter, most often seen in the setting of chronic microvascular ischemia. No mass lesion. No hydrocephalus, age advanced atrophy or lobar predominant volume loss. No dural abnormality or extra-axial collection. Skull and upper cervical spine: Cervical spine is incompletely visualized but there appears to be moderate stenosis at the C3-C4 level. Sinuses/Orbits: No fluid levels or advanced mucosal thickening. No mastoid effusion. Normal orbits. MRA HEAD FINDINGS Intracranial internal carotid arteries: Symmetric bilateral narrowing of the internal carotid arteries just proximal to the skullbase is suspected to be artifactual. Otherwise, they are normal. Anterior cerebral arteries: Normal. Middle cerebral arteries: There is mild, diffuse narrowing of the left M1 segment. There is multifocal stenosis within the distal MCA distribution bilaterally. Posterior communicating arteries: Present bilaterally, larger on the right. Posterior cerebral arteries: There is multifocal moderate to severe narrowing of both P2 segments. Basilar artery: There is multifocal narrowing of the basilar artery. Vertebral arteries: Left dominant. There is limited flow related enhancement seen within the diminutive right vertebral artery, which terminates as it gives rise to the PICA. The left vertebral artery is normal. Superior cerebellar arteries: Normal. Anterior inferior cerebellar arteries: Normal. Posterior inferior cerebellar arteries: The right PICA's flow related enhancement is lost just distal to its origin from the right vertebral artery. The left PICA is normal. IMPRESSION: 1. Multifocal acute ischemia within the right cerebellar  hemisphere, located within the the right posterior inferior cerebellar artery territory. Mild edema, but no hemorrhage or mass effect.  2. 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 3. Extensive, multifocal intracranial atherosclerosis with moderate stenoses of multiple distal MCA branches, the basilar artery and the bilateral posterior cerebral artery P2 segments. 4. Incompletely visualized moderate spinal canal stenosis at C3-4. Consider further evaluation with MRI of the cervical spine, if clinically warranted. Electronically Signed   By: Ulyses Jarred M.D.   On: 06/24/2016 15:51     CBC  Recent Labs Lab 06/24/16 1320  WBC 9.5  HGB 13.3  HCT 38.2*  PLT 294  MCV 85.8  MCH 29.9  MCHC 34.8  RDW 13.0  LYMPHSABS 2.6  MONOABS 0.8  EOSABS 0.5  BASOSABS 0.1    Chemistries   Recent Labs Lab 06/24/16 1320  NA 136  K 4.2  CL 103  CO2 24  GLUCOSE 118*  BUN 27*  CREATININE 1.24  CALCIUM 9.4   ------------------------------------------------------------------------------------------------------------------ estimated creatinine clearance is 68.1 mL/min (by C-G formula based on SCr of 1.24 mg/dL). ------------------------------------------------------------------------------------------------------------------ No results for input(s): HGBA1C in the last 72 hours. ------------------------------------------------------------------------------------------------------------------  Recent Labs  06/25/16 0425  CHOL 220*  HDL 40*  LDLCALC 161*  TRIG 95  CHOLHDL 5.5   ------------------------------------------------------------------------------------------------------------------  Recent Labs  06/24/16 2249  TSH 3.086   ------------------------------------------------------------------------------------------------------------------ No results for input(s): VITAMINB12, FOLATE, FERRITIN, TIBC, IRON, RETICCTPCT in the last 72 hours.  Coagulation profile  Recent  Labs Lab 06/24/16 1330  INR 0.93    No results for input(s): DDIMER in the last 72 hours.  Cardiac Enzymes  Recent Labs Lab 06/24/16 1320  TROPONINI <0.03   ------------------------------------------------------------------------------------------------------------------ Invalid input(s): POCBNP   CBG:  Recent Labs Lab 06/24/16 2216 06/25/16 0612 06/25/16 1134  GLUCAP 111* 115* 113*       Studies: Ct Angio Head W Or Wo Contrast  Result Date: 06/25/2016 CLINICAL DATA:  66 y/o  M; stroke. EXAM: CT ANGIOGRAPHY HEAD AND NECK TECHNIQUE: Multidetector CT imaging of the head and neck was performed using the standard protocol during bolus administration of intravenous contrast. Multiplanar CT image reconstructions and MIPs were obtained to evaluate the vascular anatomy. Carotid stenosis measurements (when applicable) are obtained utilizing NASCET criteria, using the distal internal carotid diameter as the denominator. CONTRAST:  50 cc Isovue 370 COMPARISON:  06/24/2016 MRI of the head and MRA of the head. FINDINGS: CT HEAD FINDINGS Brain: Small foci of hypoattenuation within the right cerebellar hemisphere correspond areas of infarction on the prior MRI of the brain. There is no evidence for new large territory infarct, acute intracranial hemorrhage, or focal mass effect. Scattered foci of hypoattenuation in periventricular and subcortical white matter are nonspecific but compatible with mild chronic microvascular ischemic changes. Mild diffuse brain parenchymal volume loss. Vascular: As below. Skull: Normal. Negative for fracture or focal lesion. Sinuses: Imaged portions are clear. Orbits: No acute finding. Review of the MIP images confirms the above findings CTA NECK FINDINGS Aortic arch: Bovine arch, normal variant. Mild calcific atherosclerosis of the arch. Right carotid system: No evidence of dissection, stenosis (50% or greater) or occlusion. Scattered calcified plaque of the common  carotid artery and moderate mixed plaque of the carotid bifurcation without significant stenosis. Left carotid system: Moderate to severe mixed plaque of the left carotid bifurcation with severe proximal ICA 70-80% stenosis (series 9, image 161). Vertebral arteries: Left dominant. No evidence of dissection, stenosis (50% or greater) or occlusion. Skeleton: Mild to moderate cervical spondylosis. No high-grade bony canal stenosis. Multiple periapical cysts predominantly of the residual maxillary teeth compatible with odontogenic  disease. Other neck: Negative. Upper chest: Negative. Review of the MIP images confirms the above findings CTA HEAD FINDINGS Anterior circulation: Extensive calcified plaque of the ICA cavernous and paraclinoid segments with moderate to severe right and moderate left paraclinoid stenosis (series 9 image 96). Long segment of moderate left M1 stenosis. Bilateral A1, A2, and right M1 segment irregularity with areas of mild stenosis. Otherwise no large vessel occlusion, aneurysm, or significant stenosis is identified. Posterior circulation: Severe stenosis/ near occlusion of the right V3 4 junction (series 7, image 167) with lumen irregularity and multiple areas of stenosis of the residual right V4 segment. No definite right PICA identified, hypoplastic or occluded. Mild calcified plaque of the left V4 segment with areas of mild stenosis. Severe mid basilar stenosis. Diminutive right P1 segment with large right posterior communicating artery consistent with persistent fetal circulation. Bilateral P2 segments are irregular with multiple areas of mild-to-moderate left and up to severe right stenosis. Otherwise no large vessel occlusion, aneurysm, or significant stenosis is identified. Venous sinuses: As permitted by contrast timing, patent. Anatomic variants: Bilateral posterior communicating arteries and the anterior communicating artery are identified. Delayed phase: No abnormal intracranial  enhancement. Review of the MIP images confirms the above findings IMPRESSION: CT head: 1. Foci of hypoattenuation in the right cerebellar hemisphere corresponds to acute infarcts on prior MRI. 2. No evidence for new large territory infarct, acute intracranial hemorrhage, or significant mass effect. 3. Mild chronic microvascular ischemic changes and mild parenchymal volume loss of the brain. CTA neck: 1. 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 2. Otherwise no large vessel occlusion, aneurysm, or significant stenosis of the carotid and vertebral arteries of the neck is identified. CTA head: 1. Severe stenosis/near occlusion of the right V3/V4 junction with severe atherosclerotic disease of the right V4 segment. No definite right PICA identified, either hypoplastic or occluded. 2. Severe mid basilar stenosis. 3. Calcified plaque of bilateral internal carotid arteries with moderate to severe right and moderate left paraclinoid stenosis. 4. 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 5. No large vessel occlusion or aneurysm of the circle of Willis identified. Electronically Signed   By: Kristine Garbe M.D.   On: 06/25/2016 02:03   Dg Chest 2 View  Result Date: 06/24/2016 CLINICAL DATA:  Dizziness for 2 days.  Imbalance. EXAM: CHEST  2 VIEW COMPARISON:  01/12/2004. FINDINGS: The heart size and mediastinal contours are within normal limits. Both lungs are clear. The visualized skeletal structures are unremarkable. Calcified tortuous aorta. IMPRESSION: No active cardiopulmonary disease.  Stable appearance from priors. Electronically Signed   By: Staci Righter M.D.   On: 06/24/2016 15:39   Ct Angio Neck W Or Wo Contrast  Result Date: 06/25/2016 CLINICAL DATA:  66 y/o  M; stroke. EXAM: CT ANGIOGRAPHY HEAD AND NECK TECHNIQUE: Multidetector CT imaging of the head and neck was performed using the standard protocol  during bolus administration of intravenous contrast. Multiplanar CT image reconstructions and MIPs were obtained to evaluate the vascular anatomy. Carotid stenosis measurements (when applicable) are obtained utilizing NASCET criteria, using the distal internal carotid diameter as the denominator. CONTRAST:  50 cc Isovue 370 COMPARISON:  06/24/2016 MRI of the head and MRA of the head. FINDINGS: CT HEAD FINDINGS Brain: Small foci of hypoattenuation within the right cerebellar hemisphere correspond areas of infarction on the prior MRI of the brain. There is no evidence for new large territory infarct, acute  intracranial hemorrhage, or focal mass effect. Scattered foci of hypoattenuation in periventricular and subcortical white matter are nonspecific but compatible with mild chronic microvascular ischemic changes. Mild diffuse brain parenchymal volume loss. Vascular: As below. Skull: Normal. Negative for fracture or focal lesion. Sinuses: Imaged portions are clear. Orbits: No acute finding. Review of the MIP images confirms the above findings CTA NECK FINDINGS Aortic arch: Bovine arch, normal variant. Mild calcific atherosclerosis of the arch. Right carotid system: No evidence of dissection, stenosis (50% or greater) or occlusion. Scattered calcified plaque of the common carotid artery and moderate mixed plaque of the carotid bifurcation without significant stenosis. Left carotid system: Moderate to severe mixed plaque of the left carotid bifurcation with severe proximal ICA 70-80% stenosis (series 9, image 161). Vertebral arteries: Left dominant. No evidence of dissection, stenosis (50% or greater) or occlusion. Skeleton: Mild to moderate cervical spondylosis. No high-grade bony canal stenosis. Multiple periapical cysts predominantly of the residual maxillary teeth compatible with odontogenic disease. Other neck: Negative. Upper chest: Negative. Review of the MIP images confirms the above findings CTA HEAD FINDINGS  Anterior circulation: Extensive calcified plaque of the ICA cavernous and paraclinoid segments with moderate to severe right and moderate left paraclinoid stenosis (series 9 image 96). Long segment of moderate left M1 stenosis. Bilateral A1, A2, and right M1 segment irregularity with areas of mild stenosis. Otherwise no large vessel occlusion, aneurysm, or significant stenosis is identified. Posterior circulation: Severe stenosis/ near occlusion of the right V3 4 junction (series 7, image 167) with lumen irregularity and multiple areas of stenosis of the residual right V4 segment. No definite right PICA identified, hypoplastic or occluded. Mild calcified plaque of the left V4 segment with areas of mild stenosis. Severe mid basilar stenosis. Diminutive right P1 segment with large right posterior communicating artery consistent with persistent fetal circulation. Bilateral P2 segments are irregular with multiple areas of mild-to-moderate left and up to severe right stenosis. Otherwise no large vessel occlusion, aneurysm, or significant stenosis is identified. Venous sinuses: As permitted by contrast timing, patent. Anatomic variants: Bilateral posterior communicating arteries and the anterior communicating artery are identified. Delayed phase: No abnormal intracranial enhancement. Review of the MIP images confirms the above findings IMPRESSION: CT head: 1. Foci of hypoattenuation in the right cerebellar hemisphere corresponds to acute infarcts on prior MRI. 2. No evidence for new large territory infarct, acute intracranial hemorrhage, or significant mass effect. 3. Mild chronic microvascular ischemic changes and mild parenchymal volume loss of the brain. CTA neck: 1. 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 2. Otherwise no large vessel occlusion, aneurysm, or significant stenosis of the carotid and vertebral arteries of the neck is identified. CTA head: 1. Severe stenosis/near occlusion of  the right V3/V4 junction with severe atherosclerotic disease of the right V4 segment. No definite right PICA identified, either hypoplastic or occluded. 2. Severe mid basilar stenosis. 3. Calcified plaque of bilateral internal carotid arteries with moderate to severe right and moderate left paraclinoid stenosis. 4. 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 5. No large vessel occlusion or aneurysm of the circle of Willis identified. Electronically Signed   By: Kristine Garbe M.D.   On: 06/25/2016 02:03   Mr Jodene Nam Head Wo Contrast  Result Date: 06/24/2016 CLINICAL DATA:  Dizziness and unsteady gait.  Right-sided headache. EXAM: MRI HEAD WITHOUT CONTRAST MRA HEAD WITHOUT CONTRAST TECHNIQUE: Multiplanar, multiecho pulse sequences of the brain and surrounding  structures were obtained without intravenous contrast. Angiographic images of the head were obtained using MRA technique without contrast. The examination had to be discontinued prior to completion due to patient discomfort. No susceptibility weighted imaging or coronal T2 weighted imaging was obtained. COMPARISON:  None. FINDINGS: MRI HEAD FINDINGS Brain: There is a shallow sella. There are multiple foci of diffusion restriction within the right cerebellum, within the PICA territory. No other diffusion restriction. There is hyperintense T2 weighted signal within the cerebellum at the site of the above-described diffusion restriction. There is mild multifocal hyperintense T2-weighted signal within the periventricular white matter, most often seen in the setting of chronic microvascular ischemia. No mass lesion. No hydrocephalus, age advanced atrophy or lobar predominant volume loss. No dural abnormality or extra-axial collection. Skull and upper cervical spine: Cervical spine is incompletely visualized but there appears to be moderate stenosis at the C3-C4 level. Sinuses/Orbits: No fluid  levels or advanced mucosal thickening. No mastoid effusion. Normal orbits. MRA HEAD FINDINGS Intracranial internal carotid arteries: Symmetric bilateral narrowing of the internal carotid arteries just proximal to the skullbase is suspected to be artifactual. Otherwise, they are normal. Anterior cerebral arteries: Normal. Middle cerebral arteries: There is mild, diffuse narrowing of the left M1 segment. There is multifocal stenosis within the distal MCA distribution bilaterally. Posterior communicating arteries: Present bilaterally, larger on the right. Posterior cerebral arteries: There is multifocal moderate to severe narrowing of both P2 segments. Basilar artery: There is multifocal narrowing of the basilar artery. Vertebral arteries: Left dominant. There is limited flow related enhancement seen within the diminutive right vertebral artery, which terminates as it gives rise to the PICA. The left vertebral artery is normal. Superior cerebellar arteries: Normal. Anterior inferior cerebellar arteries: Normal. Posterior inferior cerebellar arteries: The right PICA's flow related enhancement is lost just distal to its origin from the right vertebral artery. The left PICA is normal. IMPRESSION: 1. Multifocal acute ischemia within the right cerebellar hemisphere, located within the the right posterior inferior cerebellar artery territory. Mild edema, but no hemorrhage or mass effect. 2. 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 3. Extensive, multifocal intracranial atherosclerosis with moderate stenoses of multiple distal MCA branches, the basilar artery and the bilateral posterior cerebral artery P2 segments. 4. Incompletely visualized moderate spinal canal stenosis at C3-4. Consider further evaluation with MRI of the cervical spine, if clinically warranted. Electronically Signed   By: Ulyses Jarred M.D.   On: 06/24/2016 15:51   Mr Brain Wo Contrast  Result Date: 06/24/2016 CLINICAL DATA:  Dizziness and  unsteady gait.  Right-sided headache. EXAM: MRI HEAD WITHOUT CONTRAST MRA HEAD WITHOUT CONTRAST TECHNIQUE: Multiplanar, multiecho pulse sequences of the brain and surrounding structures were obtained without intravenous contrast. Angiographic images of the head were obtained using MRA technique without contrast. The examination had to be discontinued prior to completion due to patient discomfort. No susceptibility weighted imaging or coronal T2 weighted imaging was obtained. COMPARISON:  None. FINDINGS: MRI HEAD FINDINGS Brain: There is a shallow sella. There are multiple foci of diffusion restriction within the right cerebellum, within the PICA territory. No other diffusion restriction. There is hyperintense T2 weighted signal within the cerebellum at the site of the above-described diffusion restriction. There is mild multifocal hyperintense T2-weighted signal within the periventricular white matter, most often seen in the setting of chronic microvascular ischemia. No mass lesion. No hydrocephalus, age advanced atrophy or lobar predominant volume loss. No dural abnormality or extra-axial collection. Skull and upper cervical spine: Cervical spine is  incompletely visualized but there appears to be moderate stenosis at the C3-C4 level. Sinuses/Orbits: No fluid levels or advanced mucosal thickening. No mastoid effusion. Normal orbits. MRA HEAD FINDINGS Intracranial internal carotid arteries: Symmetric bilateral narrowing of the internal carotid arteries just proximal to the skullbase is suspected to be artifactual. Otherwise, they are normal. Anterior cerebral arteries: Normal. Middle cerebral arteries: There is mild, diffuse narrowing of the left M1 segment. There is multifocal stenosis within the distal MCA distribution bilaterally. Posterior communicating arteries: Present bilaterally, larger on the right. Posterior cerebral arteries: There is multifocal moderate to severe narrowing of both P2 segments. Basilar  artery: There is multifocal narrowing of the basilar artery. Vertebral arteries: Left dominant. There is limited flow related enhancement seen within the diminutive right vertebral artery, which terminates as it gives rise to the PICA. The left vertebral artery is normal. Superior cerebellar arteries: Normal. Anterior inferior cerebellar arteries: Normal. Posterior inferior cerebellar arteries: The right PICA's flow related enhancement is lost just distal to its origin from the right vertebral artery. The left PICA is normal. IMPRESSION: 1. Multifocal acute ischemia within the right cerebellar hemisphere, located within the the right posterior inferior cerebellar artery territory. Mild edema, but no hemorrhage or mass effect. 2. 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 3. Extensive, multifocal intracranial atherosclerosis with moderate stenoses of multiple distal MCA branches, the basilar artery and the bilateral posterior cerebral artery P2 segments. 4. Incompletely visualized moderate spinal canal stenosis at C3-4. Consider further evaluation with MRI of the cervical spine, if clinically warranted. Electronically Signed   By: Ulyses Jarred M.D.   On: 06/24/2016 15:51      No results found for: HGBA1C Lab Results  Component Value Date   LDLCALC 161 (H) 06/25/2016   CREATININE 1.24 06/24/2016       Scheduled Meds: . aspirin  300 mg Rectal Daily   Or  . aspirin  325 mg Oral Daily  . atorvastatin  40 mg Oral q1800  . clopidogrel  75 mg Oral Daily  . enoxaparin (LOVENOX) injection  40 mg Subcutaneous Q24H  . insulin aspart  0-15 Units Subcutaneous TID WC  . insulin aspart  0-5 Units Subcutaneous QHS   Continuous Infusions: . sodium chloride 50 mL/hr at 06/24/16 2257     LOS: 1 day    Time spent: >30 MINS    Reyne Dumas  [facility] Pager [phone]. If 7PM-7AM, please contact night-coverage at [email or web address], password [facility] 06/25/2016, 11:43 AM  LOS: 1 day

## 2016-06-26 ENCOUNTER — Other Ambulatory Visit: Payer: Self-pay | Admitting: Cardiology

## 2016-06-26 ENCOUNTER — Inpatient Hospital Stay (HOSPITAL_COMMUNITY): Payer: Medicare HMO

## 2016-06-26 ENCOUNTER — Encounter (HOSPITAL_COMMUNITY): Payer: Self-pay | Admitting: Cardiology

## 2016-06-26 DIAGNOSIS — I428 Other cardiomyopathies: Secondary | ICD-10-CM

## 2016-06-26 DIAGNOSIS — I6522 Occlusion and stenosis of left carotid artery: Secondary | ICD-10-CM

## 2016-06-26 DIAGNOSIS — I6789 Other cerebrovascular disease: Secondary | ICD-10-CM

## 2016-06-26 DIAGNOSIS — I429 Cardiomyopathy, unspecified: Secondary | ICD-10-CM

## 2016-06-26 DIAGNOSIS — I6529 Occlusion and stenosis of unspecified carotid artery: Secondary | ICD-10-CM

## 2016-06-26 DIAGNOSIS — R42 Dizziness and giddiness: Secondary | ICD-10-CM

## 2016-06-26 LAB — COMPREHENSIVE METABOLIC PANEL
ALBUMIN: 3.5 g/dL (ref 3.5–5.0)
ALK PHOS: 58 U/L (ref 38–126)
ALT: 21 U/L (ref 17–63)
AST: 30 U/L (ref 15–41)
Anion gap: 10 (ref 5–15)
BILIRUBIN TOTAL: 0.5 mg/dL (ref 0.3–1.2)
BUN: 16 mg/dL (ref 6–20)
CO2: 23 mmol/L (ref 22–32)
CREATININE: 1.11 mg/dL (ref 0.61–1.24)
Calcium: 9 mg/dL (ref 8.9–10.3)
Chloride: 102 mmol/L (ref 101–111)
GFR calc Af Amer: >60 mL/min (ref >60)
GFR calc non Af Amer: >60 mL/min (ref >60)
GLUCOSE: 113 mg/dL — AB (ref 65–99)
POTASSIUM: 4.1 mmol/L (ref 3.5–5.1)
SODIUM: 135 mmol/L (ref 135–145)
TOTAL PROTEIN: 6.8 g/dL (ref 6.5–8.1)

## 2016-06-26 LAB — GLUCOSE, CAPILLARY
Glucose-Capillary: 109 mg/dL — ABNORMAL HIGH (ref 65–99)
Glucose-Capillary: 90 mg/dL (ref 65–99)
Glucose-Capillary: 81 mg/dL (ref 65–99)

## 2016-06-26 LAB — CBC
HCT: 35.3 % — ABNORMAL LOW (ref 39.0–52.0)
Hemoglobin: 12.4 g/dL — ABNORMAL LOW (ref 13.0–17.0)
MCH: 30.1 pg (ref 26.0–34.0)
MCHC: 35.1 g/dL (ref 30.0–36.0)
MCV: 85.7 fL (ref 78.0–100.0)
PLATELETS: 271 10*3/uL (ref 150–400)
RBC: 4.12 MIL/uL — AB (ref 4.22–5.81)
RDW: 13.2 % (ref 11.5–15.5)
WBC: 6.4 10*3/uL (ref 4.0–10.5)

## 2016-06-26 LAB — ECHOCARDIOGRAM COMPLETE
Ao-asc: 37 cm
CHL CUP MV DEC (S): 317
E decel time: 317 msec
EERAT: 7.39
FS: 33 % (ref 28–44)
Height: 74 in
IVS/LV PW RATIO, ED: 1.35
LA ID, A-P, ES: 27 mm
LA diam end sys: 27 mm
LA vol index: 18.9 mL/m2
LADIAMINDEX: 1.2 cm/m2
LAVOL: 42.5 mL
LAVOLA4C: 42.7 mL
LV E/e'average: 7.39
LV TDI E'LATERAL: 7.37
LVEEMED: 7.39
LVELAT: 7.37 cm/s
LVOT VTI: 23.2 cm
LVOT area: 3.14 cm2
LVOT diameter: 20 mm
LVOT peak vel: 106 cm/s
LVOTSV: 73 mL
MVPKAVEL: 84.8 m/s
MVPKEVEL: 54.5 m/s
P 1/2 time: 583 ms
PW: 12.4 mm — AB (ref 0.6–1.1)
RV LATERAL S' VELOCITY: 15 cm/s
RV sys press: 26 mmHg
Reg peak vel: 238 cm/s
TAPSE: 23.8 mm
TDI e' medial: 3.75
TR max vel: 238 cm/s
Weight: 3476.8 oz

## 2016-06-26 LAB — HEMOGLOBIN A1C
Hgb A1c MFr Bld: 6.4 % — ABNORMAL HIGH (ref 4.8–5.6)
Mean Plasma Glucose: 137 mg/dL

## 2016-06-26 MED ORDER — ASPIRIN 325 MG PO TABS: 325.0000 mg | ORAL_TABLET | Freq: Every day | ORAL | 3 refills | Status: DC

## 2016-06-26 MED ORDER — PANTOPRAZOLE SODIUM 40 MG PO TBEC
40.0000 mg | DELAYED_RELEASE_TABLET | Freq: Every day | ORAL | 1 refills | Status: DC
Start: 2016-06-26 — End: 2016-07-08

## 2016-06-26 MED ORDER — LISINOPRIL 5 MG PO TABS
5.0000 mg | ORAL_TABLET | Freq: Every day | ORAL | Status: DC
  Administered 2016-06-26: 5 mg via ORAL
  Filled 2016-06-26: qty 1

## 2016-06-26 MED ORDER — LISINOPRIL 5 MG PO TABS: 5.0000 mg | ORAL_TABLET | Freq: Every day | ORAL | 1 refills | Status: DC

## 2016-06-26 MED ORDER — ATORVASTATIN CALCIUM 40 MG PO TABS: 40.0000 mg | ORAL_TABLET | Freq: Every day | ORAL | 3 refills | Status: DC

## 2016-06-26 MED ORDER — CARVEDILOL 3.125 MG PO TABS
3.1250 mg | ORAL_TABLET | Freq: Two times a day (BID) | ORAL | Status: DC
  Administered 2016-06-26: 3.125 mg via ORAL
  Filled 2016-06-26: qty 1

## 2016-06-26 MED ORDER — CLOPIDOGREL BISULFATE 75 MG PO TABS: 75.0000 mg | ORAL_TABLET | Freq: Every day | ORAL | 3 refills | Status: DC

## 2016-06-26 MED ORDER — CARVEDILOL 3.125 MG PO TABS: 3.1250 mg | ORAL_TABLET | Freq: Two times a day (BID) | ORAL | 1 refills | Status: DC

## 2016-06-26 NOTE — Consult Note (Signed)
[facility] CM Primary Care Navigator  06/26/2016  Albert Morris Oct 07, 1950 [id number]    Met with patient at the bedside to identify possible discharge needs. Patientreports having dizziness, loss of balance and cramping to right leg that had led to this admission. Patient has not seen a physician in years. Inpatient CM has obtained an appointment at [facility] office in [place]. RN reports that patient will follow-up with Dr. Raylene Everts at [facility] as his primary care provider.   [facility] [place] [facility] without any problem.   Patient states that he was not on any regular medications prior to admission. He verbalized that he will be managing his medications at home and plans to use a "pill box" to organize.   Patient mentioned that he was driving prior to admission and hopes to do the same after discharge, however, his neighbor Romie Minus) canprovidetransportation tohisdoctors' appointments when needed.  [employer] transportation benefits discussed with patient as well.  Patient lives alone and independent with self care before this admission. He mentioned that he can depend on his cousin Lindaann Slough) to assist with care needs at home if needed.  Anticipated discharge plan is homewith home health services per therapist recommendation.  Patient voiced understanding to follow-up with primary care providerwhen he returns home,for a post hospital visit within a week or sooner if needed.Patient letter (with PCP's contact number) was provided as a reminder.  Explained to patient about [facility] CM services available for health management at home. Patient verbally agreedand optedEMMI strokecalls to follow-up and help with his recovery.   Referral was made for EMMI stroke calls after discharge.  For questions, please contact:  Dannielle Huh, BSN, RN- [facility] Primary Care Navigator  Telephone:  [phone] [place]

## 2016-06-26 NOTE — Progress Notes (Signed)
Pt discharged at this time.  He has all belongings, including belongings from security with him.  He has discharge instructions and verbalizes that he understands all instructions including medications, and follow-up appointments.  Signed Release of Information given to Medical Records to obtain his records to go to [facility].

## 2016-06-26 NOTE — Care Management Note (Signed)
Case Management Note  Patient Details  Name: Albert Morris MRN: [id number] Date of Birth: [birth year]  Subjective/Objective:                    Action/Plan: Pt discharging home today with [facility] services. Pt already set up with [facility]. Pt refused 3 in 1 and PCP appointment arranged. No further needs per CM.  Expected Discharge Date:  06/26/16               Expected Discharge Plan:  [place]  In-House Referral:     Discharge planning Services  CM Consult  Post Acute Care Choice:  Durable Medical Equipment, Home Health (pt refused 3 in 1) Choice offered to:  Patient  DME Arranged:    DME Agency:     HH Arranged:  PT, OT HH Agency:  [facility]  Status of Service:  Completed, signed off  If discussed at [place] of Stay Meetings, dates discussed:    Additional Comments:  Pollie Friar, RN 06/26/2016, 1:24 PM

## 2016-06-26 NOTE — Progress Notes (Signed)
   Per patient request, Advanced Directive (AD) documentation left at bedside.  If/when patient decides to move forward with AD, please page on-call chaplain or contact the spiritual care department (Mon-Fri 9AM-3PM).   Will follow, as needed.   - Rev. [place] MDiv ThM

## 2016-06-26 NOTE — Consult Note (Signed)
Cardiology Consult    Patient ID: Albert Morris MRN: [id number], DOB/AGE: 09/07/1950   Admit date: 06/24/2016 Date of Consult: 06/26/2016  Primary Physician: Patient, No Pcp Per Reason for Consult: New LV dysfunction Primary Cardiologist: New Requesting Provider: Dr Allyson Sabal  History of Present Illness    Albert Morris is a 66 y.o. male who is being seen today for the evaluation of new LV dysfunction with EF 45-50% at the request of Dr. Allyson Sabal. The patient has no known medical history. He has not had any medical care since about 2004. He has now been set up for primary care post discharge.   He presented to [facility] for evaluation of dizziness. An MRI demonstrated right cerebellar ischemia and he was transferred to [facility] for further evaluation. He was started on dual antiplatelet therapy with plans to continue aspirin 325 mg and Plavix 75 mg for 3 months then Plavix alone due to severe intracranial stenosis and managed by the stroke team.   He has never been diagnosed with hypertension, high cholesterol, heart disease, diabetes, or previous stroke that he knows of. He smoked about a 1/2 PPD from age 25 to 89. He was a heavy alcohol drinker until age 66. Prior to admission he was walking in the park at least a mile a day without any exertional chest discomfort or dyspnea. He denies any orthopnea, PND, palpitations, or edema. He is concerned that he has had high blood pressure, but has not been seen for this. He has no known family cardiac history, his mother is 49 yrs old and has had cardiac issues only in recently in her upper 25's. His father died of a GSW early in life. His 2 half brothers died of cancer.   CTA of the neck showed Severe 70-80% left proximal ICA stenosis secondary to mixed plaque. Otherwise no large vessel occlusion, aneurysm, or significant stenosis of the carotid and vertebral arteries of the neck is identified.  Echocardiogram showed LVEF 45-50%,  moderate LVH, akinesis and aneurysmal deformity of the basalinferoseptal myocardium, grade 1 DD  Troponin on admission ws negative.  Chest Xray showed no active cardiopulmonary disease   Past Medical History   Past Medical History:  Diagnosis Date  . CVA (cerebral vascular accident) ([place]) 06/21/2016    Past Surgical History:  Procedure Laterality Date  . [place]   growth removed  . TONSILLECTOMY       Allergies  No Known Allergies  Inpatient Medications    . aspirin  300 mg Rectal Daily   Or  . aspirin  325 mg Oral Daily  . atorvastatin  40 mg Oral q1800  . carvedilol  3.125 mg Oral BID WC  . clopidogrel  75 mg Oral Daily  . enoxaparin (LOVENOX) injection  40 mg Subcutaneous Q24H  . insulin aspart  0-15 Units Subcutaneous TID WC  . insulin aspart  0-5 Units Subcutaneous QHS  . lisinopril  5 mg Oral Daily    Family History    Family History  Problem Relation Age of Onset  . Breast cancer Mother        She is 54 years old, in [place]  . CVA Mother   . CAD Mother   . Other Father        gunshot wound  . Cancer Brother   . Cancer Brother      Social History    Social History   Social History  . Marital status:  Divorced    Spouse name: N/A  . Number of children: N/A  . Years of education: N/A   Occupational History  . retired    Social History Main Topics  . Smoking status: Former Smoker    Packs/day: 0.50    Years: 18.00    Quit date: 2006  . Smokeless tobacco: Never Used  . Alcohol use No     Comment: h/o heavy use  . Drug use: No     Comment: remote h/o heavy marijuana use, also used cocaine and others  . Sexual activity: Not on file   Other Topics Concern  . Not on file   Social History Narrative  . No narrative on file     Review of Systems    General:  No chills, fever, night sweats or weight changes.  Cardiovascular:  No chest pain, dyspnea on exertion, edema, orthopnea, palpitations, paroxysmal nocturnal  dyspnea. Dermatological: No rash, lesions/masses Respiratory: No cough, dyspnea Urologic: No hematuria, dysuria Abdominal:   No nausea, vomiting, diarrhea, bright red blood per rectum, melena, or hematemesis Neurologic:  No visual changes, wkns, changes in mental status. All other systems reviewed and are otherwise negative except as noted above.  Physical Exam    Blood pressure (!) 147/89, pulse 77, temperature 97.9 F (36.6 C), temperature source Axillary, resp. rate 18, height 6\' 2"  (1.88 m), weight 217 lb 4.8 oz (98.6 kg), SpO2 98 %.  General: Pleasant, NAD Psych: Normal affect. Neuro: Alert and oriented X 3. Moves all extremities spontaneously. HEENT: Normal  Neck: Supple without bruits or JVD. Lungs:  Resp regular and unlabored, CTA. Heart: RRR no s3, s4, or murmurs. Abdomen: Soft, non-tender, non-distended, BS + x 4.  Extremities: No clubbing, cyanosis or edema. DP/PT/Radials 2+ and equal bilaterally.  Labs    Troponin (Point of Care Test) No results for input(s): TROPIPOC in the last 72 hours.  Recent Labs  06/24/16 1320  TROPONINI <0.03   Lab Results  Component Value Date   WBC 6.4 06/26/2016   HGB 12.4 (L) 06/26/2016   HCT 35.3 (L) 06/26/2016   MCV 85.7 06/26/2016   PLT 271 06/26/2016     Recent Labs Lab 06/26/16 0322  NA 135  K 4.1  CL 102  CO2 23  BUN 16  CREATININE 1.11  CALCIUM 9.0  PROT 6.8  BILITOT 0.5  ALKPHOS 58  ALT 21  AST 30  GLUCOSE 113*   Lab Results  Component Value Date   CHOL 220 (H) 06/25/2016   HDL 40 (L) 06/25/2016   LDLCALC 161 (H) 06/25/2016   TRIG 95 06/25/2016   No results found for: [facility]   Radiology Studies    Ct Angio Head W Or Wo Contrast  Result Date: 06/25/2016 CLINICAL DATA:  66 y/o  M; stroke. EXAM: CT ANGIOGRAPHY HEAD AND NECK TECHNIQUE: Multidetector CT imaging of the head and neck was performed using the standard protocol during bolus administration of intravenous contrast. Multiplanar CT image  reconstructions and MIPs were obtained to evaluate the vascular anatomy. Carotid stenosis measurements (when applicable) are obtained utilizing NASCET criteria, using the distal internal carotid diameter as the denominator. CONTRAST:  50 cc Isovue 370 COMPARISON:  06/24/2016 MRI of the head and MRA of the head. FINDINGS: CT HEAD FINDINGS Brain: Small foci of hypoattenuation within the right cerebellar hemisphere correspond areas of infarction on the prior MRI of the brain. There is no evidence for new large territory infarct, acute intracranial hemorrhage, or focal mass effect.  Scattered foci of hypoattenuation in periventricular and subcortical white matter are nonspecific but compatible with mild chronic microvascular ischemic changes. Mild diffuse brain parenchymal volume loss. Vascular: As below. Skull: Normal. Negative for fracture or focal lesion. Sinuses: Imaged portions are clear. Orbits: No acute finding. Review of the MIP images confirms the above findings CTA NECK FINDINGS Aortic arch: Bovine arch, normal variant. Mild calcific atherosclerosis of the arch. Right carotid system: No evidence of dissection, stenosis (50% or greater) or occlusion. Scattered calcified plaque of the common carotid artery and moderate mixed plaque of the carotid bifurcation without significant stenosis. Left carotid system: Moderate to severe mixed plaque of the left carotid bifurcation with severe proximal ICA 70-80% stenosis (series 9, image 161). Vertebral arteries: Left dominant. No evidence of dissection, stenosis (50% or greater) or occlusion. Skeleton: Mild to moderate cervical spondylosis. No high-grade bony canal stenosis. Multiple periapical cysts predominantly of the residual maxillary teeth compatible with odontogenic disease. Other neck: Negative. Upper chest: Negative. Review of the MIP images confirms the above findings CTA HEAD FINDINGS Anterior circulation: Extensive calcified plaque of the ICA cavernous and  paraclinoid segments with moderate to severe right and moderate left paraclinoid stenosis (series 9 image 96). Long segment of moderate left M1 stenosis. Bilateral A1, A2, and right M1 segment irregularity with areas of mild stenosis. Otherwise no large vessel occlusion, aneurysm, or significant stenosis is identified. Posterior circulation: Severe stenosis/ near occlusion of the right V3 4 junction (series 7, image 167) with lumen irregularity and multiple areas of stenosis of the residual right V4 segment. No definite right PICA identified, hypoplastic or occluded. Mild calcified plaque of the left V4 segment with areas of mild stenosis. Severe mid basilar stenosis. Diminutive right P1 segment with large right posterior communicating artery consistent with persistent fetal circulation. Bilateral P2 segments are irregular with multiple areas of mild-to-moderate left and up to severe right stenosis. Otherwise no large vessel occlusion, aneurysm, or significant stenosis is identified. Venous sinuses: As permitted by contrast timing, patent. Anatomic variants: Bilateral posterior communicating arteries and the anterior communicating artery are identified. Delayed phase: No abnormal intracranial enhancement. Review of the MIP images confirms the above findings IMPRESSION: CT head: 1. Foci of hypoattenuation in the right cerebellar hemisphere corresponds to acute infarcts on prior MRI. 2. No evidence for new large territory infarct, acute intracranial hemorrhage, or significant mass effect. 3. Mild chronic microvascular ischemic changes and mild parenchymal volume loss of the brain. CTA neck: 1. 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 2. Otherwise no large vessel occlusion, aneurysm, or significant stenosis of the carotid and vertebral arteries of the neck is identified. CTA head: 1. Severe stenosis/near occlusion of the right V3/V4 junction with severe atherosclerotic disease of the right V4  segment. No definite right PICA identified, either hypoplastic or occluded. 2. Severe mid basilar stenosis. 3. Calcified plaque of bilateral internal carotid arteries with moderate to severe right and moderate left paraclinoid stenosis. 4. 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 5. No large vessel occlusion or aneurysm of the circle of Willis identified. Electronically Signed   By: Kristine Garbe M.D.   On: 06/25/2016 02:03   Dg Chest 2 View  Result Date: 06/24/2016 CLINICAL DATA:  Dizziness for 2 days.  Imbalance. EXAM: CHEST  2 VIEW COMPARISON:  01/12/2004. FINDINGS: The heart size and mediastinal contours are within normal limits. Both lungs are clear. The visualized skeletal structures  are unremarkable. Calcified tortuous aorta. IMPRESSION: No active cardiopulmonary disease.  Stable appearance from priors. Electronically Signed   By: Staci Righter M.D.   On: 06/24/2016 15:39   Ct Angio Neck W Or Wo Contrast  Result Date: 06/25/2016 CLINICAL DATA:  66 y/o  M; stroke. EXAM: CT ANGIOGRAPHY HEAD AND NECK TECHNIQUE: Multidetector CT imaging of the head and neck was performed using the standard protocol during bolus administration of intravenous contrast. Multiplanar CT image reconstructions and MIPs were obtained to evaluate the vascular anatomy. Carotid stenosis measurements (when applicable) are obtained utilizing NASCET criteria, using the distal internal carotid diameter as the denominator. CONTRAST:  50 cc Isovue 370 COMPARISON:  06/24/2016 MRI of the head and MRA of the head. FINDINGS: CT HEAD FINDINGS Brain: Small foci of hypoattenuation within the right cerebellar hemisphere correspond areas of infarction on the prior MRI of the brain. There is no evidence for new large territory infarct, acute intracranial hemorrhage, or focal mass effect. Scattered foci of hypoattenuation in periventricular and subcortical white matter  are nonspecific but compatible with mild chronic microvascular ischemic changes. Mild diffuse brain parenchymal volume loss. Vascular: As below. Skull: Normal. Negative for fracture or focal lesion. Sinuses: Imaged portions are clear. Orbits: No acute finding. Review of the MIP images confirms the above findings CTA NECK FINDINGS Aortic arch: Bovine arch, normal variant. Mild calcific atherosclerosis of the arch. Right carotid system: No evidence of dissection, stenosis (50% or greater) or occlusion. Scattered calcified plaque of the common carotid artery and moderate mixed plaque of the carotid bifurcation without significant stenosis. Left carotid system: Moderate to severe mixed plaque of the left carotid bifurcation with severe proximal ICA 70-80% stenosis (series 9, image 161). Vertebral arteries: Left dominant. No evidence of dissection, stenosis (50% or greater) or occlusion. Skeleton: Mild to moderate cervical spondylosis. No high-grade bony canal stenosis. Multiple periapical cysts predominantly of the residual maxillary teeth compatible with odontogenic disease. Other neck: Negative. Upper chest: Negative. Review of the MIP images confirms the above findings CTA HEAD FINDINGS Anterior circulation: Extensive calcified plaque of the ICA cavernous and paraclinoid segments with moderate to severe right and moderate left paraclinoid stenosis (series 9 image 96). Long segment of moderate left M1 stenosis. Bilateral A1, A2, and right M1 segment irregularity with areas of mild stenosis. Otherwise no large vessel occlusion, aneurysm, or significant stenosis is identified. Posterior circulation: Severe stenosis/ near occlusion of the right V3 4 junction (series 7, image 167) with lumen irregularity and multiple areas of stenosis of the residual right V4 segment. No definite right PICA identified, hypoplastic or occluded. Mild calcified plaque of the left V4 segment with areas of mild stenosis. Severe mid basilar  stenosis. Diminutive right P1 segment with large right posterior communicating artery consistent with persistent fetal circulation. Bilateral P2 segments are irregular with multiple areas of mild-to-moderate left and up to severe right stenosis. Otherwise no large vessel occlusion, aneurysm, or significant stenosis is identified. Venous sinuses: As permitted by contrast timing, patent. Anatomic variants: Bilateral posterior communicating arteries and the anterior communicating artery are identified. Delayed phase: No abnormal intracranial enhancement. Review of the MIP images confirms the above findings IMPRESSION: CT head: 1. Foci of hypoattenuation in the right cerebellar hemisphere corresponds to acute infarcts on prior MRI. 2. No evidence for new large territory infarct, acute intracranial hemorrhage, or significant mass effect. 3. Mild chronic microvascular ischemic changes and mild parenchymal volume loss of the brain. CTA neck: 1. Severe 70-80% left proximal ICA stenosis secondary  to mixed plaque. 2. Otherwise no large vessel occlusion, aneurysm, or significant stenosis of the carotid and vertebral arteries of the neck is identified. CTA head: 1. Severe stenosis/near occlusion of the right V3/V4 junction with severe atherosclerotic disease of the right V4 segment. No definite right PICA identified, either hypoplastic or occluded. 2. Severe mid basilar stenosis. 3. Calcified plaque of bilateral internal carotid arteries with moderate to severe right and moderate left paraclinoid stenosis. 4. 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 5. No large vessel occlusion or aneurysm of the circle of Willis identified. Electronically Signed   By: Kristine Garbe M.D.   On: 06/25/2016 02:03   Mr Jodene Nam Head Wo Contrast  Result Date: 06/24/2016 CLINICAL DATA:  Dizziness and unsteady gait.  Right-sided headache. EXAM: MRI HEAD WITHOUT CONTRAST MRA  HEAD WITHOUT CONTRAST TECHNIQUE: Multiplanar, multiecho pulse sequences of the brain and surrounding structures were obtained without intravenous contrast. Angiographic images of the head were obtained using MRA technique without contrast. The examination had to be discontinued prior to completion due to patient discomfort. No susceptibility weighted imaging or coronal T2 weighted imaging was obtained. COMPARISON:  None. FINDINGS: MRI HEAD FINDINGS Brain: There is a shallow sella. There are multiple foci of diffusion restriction within the right cerebellum, within the PICA territory. No other diffusion restriction. There is hyperintense T2 weighted signal within the cerebellum at the site of the above-described diffusion restriction. There is mild multifocal hyperintense T2-weighted signal within the periventricular white matter, most often seen in the setting of chronic microvascular ischemia. No mass lesion. No hydrocephalus, age advanced atrophy or lobar predominant volume loss. No dural abnormality or extra-axial collection. Skull and upper cervical spine: Cervical spine is incompletely visualized but there appears to be moderate stenosis at the C3-C4 level. Sinuses/Orbits: No fluid levels or advanced mucosal thickening. No mastoid effusion. Normal orbits. MRA HEAD FINDINGS Intracranial internal carotid arteries: Symmetric bilateral narrowing of the internal carotid arteries just proximal to the skullbase is suspected to be artifactual. Otherwise, they are normal. Anterior cerebral arteries: Normal. Middle cerebral arteries: There is mild, diffuse narrowing of the left M1 segment. There is multifocal stenosis within the distal MCA distribution bilaterally. Posterior communicating arteries: Present bilaterally, larger on the right. Posterior cerebral arteries: There is multifocal moderate to severe narrowing of both P2 segments. Basilar artery: There is multifocal narrowing of the basilar artery. Vertebral  arteries: Left dominant. There is limited flow related enhancement seen within the diminutive right vertebral artery, which terminates as it gives rise to the PICA. The left vertebral artery is normal. Superior cerebellar arteries: Normal. Anterior inferior cerebellar arteries: Normal. Posterior inferior cerebellar arteries: The right PICA's flow related enhancement is lost just distal to its origin from the right vertebral artery. The left PICA is normal. IMPRESSION: 1. Multifocal acute ischemia within the right cerebellar hemisphere, located within the the right posterior inferior cerebellar artery territory. Mild edema, but no hemorrhage or mass effect. 2. 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 3. Extensive, multifocal intracranial atherosclerosis with moderate stenoses of multiple distal MCA branches, the basilar artery and the bilateral posterior cerebral artery P2 segments. 4. Incompletely visualized moderate spinal canal stenosis at C3-4. Consider further evaluation with MRI of the cervical spine, if clinically warranted. Electronically Signed   By: Ulyses Jarred M.D.   On: 06/24/2016 15:51   Mr Brain Wo Contrast  Result Date: 06/24/2016 CLINICAL DATA:  Dizziness and unsteady gait.  Right-sided headache. EXAM: MRI HEAD WITHOUT CONTRAST MRA HEAD WITHOUT CONTRAST TECHNIQUE: Multiplanar, multiecho pulse sequences of the brain and surrounding structures were obtained without intravenous contrast. Angiographic images of the head were obtained using MRA technique without contrast. The examination had to be discontinued prior to completion due to patient discomfort. No susceptibility weighted imaging or coronal T2 weighted imaging was obtained. COMPARISON:  None. FINDINGS: MRI HEAD FINDINGS Brain: There is a shallow sella. There are multiple foci of diffusion restriction within the right cerebellum, within the PICA territory. No other diffusion restriction. There is hyperintense T2 weighted signal  within the cerebellum at the site of the above-described diffusion restriction. There is mild multifocal hyperintense T2-weighted signal within the periventricular white matter, most often seen in the setting of chronic microvascular ischemia. No mass lesion. No hydrocephalus, age advanced atrophy or lobar predominant volume loss. No dural abnormality or extra-axial collection. Skull and upper cervical spine: Cervical spine is incompletely visualized but there appears to be moderate stenosis at the C3-C4 level. Sinuses/Orbits: No fluid levels or advanced mucosal thickening. No mastoid effusion. Normal orbits. MRA HEAD FINDINGS Intracranial internal carotid arteries: Symmetric bilateral narrowing of the internal carotid arteries just proximal to the skullbase is suspected to be artifactual. Otherwise, they are normal. Anterior cerebral arteries: Normal. Middle cerebral arteries: There is mild, diffuse narrowing of the left M1 segment. There is multifocal stenosis within the distal MCA distribution bilaterally. Posterior communicating arteries: Present bilaterally, larger on the right. Posterior cerebral arteries: There is multifocal moderate to severe narrowing of both P2 segments. Basilar artery: There is multifocal narrowing of the basilar artery. Vertebral arteries: Left dominant. There is limited flow related enhancement seen within the diminutive right vertebral artery, which terminates as it gives rise to the PICA. The left vertebral artery is normal. Superior cerebellar arteries: Normal. Anterior inferior cerebellar arteries: Normal. Posterior inferior cerebellar arteries: The right PICA's flow related enhancement is lost just distal to its origin from the right vertebral artery. The left PICA is normal. IMPRESSION: 1. Multifocal acute ischemia within the right cerebellar hemisphere, located within the the right posterior inferior cerebellar artery territory. Mild edema, but no hemorrhage or mass effect. 2.  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 3. Extensive, multifocal intracranial atherosclerosis with moderate stenoses of multiple distal MCA branches, the basilar artery and the bilateral posterior cerebral artery P2 segments. 4. Incompletely visualized moderate spinal canal stenosis at C3-4. Consider further evaluation with MRI of the cervical spine, if clinically warranted. Electronically Signed   By: Ulyses Jarred M.D.   On: 06/24/2016 15:51    EKG & Cardiac Imaging   EKG: NSR 87 bpm  Echocardiogram:  ------------------------------------------------------------------- Study Conclusions  - Left ventricle: The cavity size was normal. Wall thickness was increased in a pattern of moderate LVH. Systolic function was mildly reduced. The estimated ejection fraction was in the range of 45% to 50%. There is akinesis and aneurysmal deformity of the basalinferoseptal myocardium. Doppler parameters are consistent with abnormal left ventricular relaxation (grade 1 diastolic dysfunction). - Aortic valve: There was mild regurgitation directed towards the mitral anterior leaflet. - Aorta: Aortic root dimension: 45 mm (ED).  Impressions:  - No cardiac source of emboli was indentified.   Assessment & Plan    Cardiomyopathy -EF 06-26%, grade 1 diastolic dysfunction  -Started on low dose coreg and ACE-I, titrate to blood pressure control as outpatient. Statin initiated. -CAD risk factors include prior smoker, untreated hypertension, hyperlipidemia, possible diabetes -Patient has no symptoms of  fluid overload or activity intolerance, walks at least 1 mile a day without ischemic symptoms. -With newly discovered extensive atherosclerosis and decreased EF with wall motion abnormality, the patient should be evaluated for coronary atherosclerosis with an out patient cardiac perfusion study once he is fully recovered from the stroke. Will arrange for follow up in our [place]  office.   CVA -Right cerebellar infarct -No significant residual deficit -per review of chart: MRA headR PICA occlusion vs stenosis. Extensive intracranial atherosclerosis. Moderate stenosis B siphons, B MCAs, BA and B P2s.  -CTA head severe stenosis/near occlusion R V3/V4 junction. Severe stenosis R V4. R PICA not seen. Severe mid BA stenosis. Moderate to severe right and moderate left paraclinoid stenosis. Extensive atherosclerosis circle of Willis, greatest L M1 and R P2. Right VA origin stenosis -CTA neck L ICA 70-80% stenosis with mixed plaque. May need vascular surgical consult for evaluation. -DAPT with aspirin 325 mg and Plavix 75 mg for 3 months, then Plavix alone due to severe intracranial stenosis  Hypertension -Allowing permissive hypertension, currently controlled. -Titrate medications as outpatient  Hyperlipidemia -TC 220, LDL 161  -Started on Lipitor 40 mg daily  Borderline diabetes -A1c 6.4 -Follow up with PCP  Signed, Daune Perch, NP-C 06/26/2016, 3:15 PM Pager: [phone]

## 2016-06-26 NOTE — Progress Notes (Signed)
  Echocardiogram 2D Echocardiogram has been performed.  Bobbye Charleston 06/26/2016, 11:29 AM

## 2016-06-26 NOTE — Discharge Summary (Addendum)
Physician Discharge Summary  Albert Morris MRN: [id number] DOB/AGE: 1950/09/19 66 y.o.  PCP: Patient, No Pcp Per   Admit date: 06/24/2016 Discharge date: 06/26/2016  Discharge Diagnoses:    Principal Problem:   CVA (cerebral vascular accident) [facility])    Follow-up recommendations Follow-up with PCP in 3-5 days , including all  additional recommended appointments as below Follow-up CBC, CMP in 3-5 days Repeat hemoglobin A1c in 3 months outpatient lexiscan myoview to assess for ischemia and possible prior inferior infarct     Current Discharge Medication List    START taking these medications   Details  aspirin 325 MG tablet Take 1 tablet (325 mg total) by mouth daily. Qty: 30 tablet, Refills: 3    atorvastatin (LIPITOR) 40 MG tablet Take 1 tablet (40 mg total) by mouth daily at 6 PM. Qty: 30 tablet, Refills: 3    carvedilol (COREG) 3.125 MG tablet Take 1 tablet (3.125 mg total) by mouth 2 (two) times daily with a meal. Qty: 60 tablet, Refills: 1    clopidogrel (PLAVIX) 75 MG tablet Take 1 tablet (75 mg total) by mouth daily. Qty: 30 tablet, Refills: 3    lisinopril (PRINIVIL,ZESTRIL) 5 MG tablet Take 1 tablet (5 mg total) by mouth daily. Qty: 30 tablet, Refills: 1    pantoprazole (PROTONIX) 40 MG tablet Take 1 tablet (40 mg total) by mouth daily. Qty: 30 tablet, Refills: 1        Discharge Condition: Stable   Discharge Instructions Get Medicines reviewed and adjusted: Please take all your medications with you for your next visit with your Primary MD  Please request your Primary MD to go over all hospital tests and procedure/radiological results at the follow up, please ask your Primary MD to get all Hospital records sent to his/her office.  If you experience worsening of your admission symptoms, develop shortness of breath, life threatening emergency, suicidal or homicidal thoughts you must seek medical attention immediately by calling 911 or calling your MD  immediately if symptoms less severe.  You must read complete instructions/literature along with all the possible adverse reactions/side effects for all the Medicines you take and that have been prescribed to you. Take any new Medicines after you have completely understood and accpet all the possible adverse reactions/side effects.   Do not drive when taking Pain medications.   Do not take more than prescribed Pain, Sleep and Anxiety Medications  Special Instructions: If you have smoked or chewed Tobacco in the last 2 yrs please stop smoking, stop any regular Alcohol and or any Recreational drug use.  Wear Seat belts while driving.  Please note  You were cared for by a hospitalist during your hospital stay. Once you are discharged, your primary care physician will handle any further medical issues. Please note that NO REFILLS for any discharge medications will be authorized once you are discharged, as it is imperative that you return to your primary care physician (or establish a relationship with a primary care physician if you do not have one) for your aftercare needs so that they can reassess your need for medications and monitor your lab values.  Discharge Instructions    Ambulatory referral to Neurology    Complete by:  As directed    Follow up with stroke clinic Cecille Rubin preferred, if not available, then consider Caesar Chestnut, [facility] or Jaynee Eagles whoever is available) at [facility] in about 6-8 weeks. Thanks.   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 Asymptomatic.       No Known Allergies    Disposition:  home with home health   Consults:  Neurology    Significant Diagnostic Studies:  Ct Angio Head W Or Wo Contrast  Result Date: 06/25/2016 CLINICAL DATA:  66 y/o  M; stroke. EXAM: CT ANGIOGRAPHY HEAD AND NECK TECHNIQUE: Multidetector CT imaging of the head and neck was performed using the standard protocol during bolus  administration of intravenous contrast. Multiplanar CT image reconstructions and MIPs were obtained to evaluate the vascular anatomy. Carotid stenosis measurements (when applicable) are obtained utilizing NASCET criteria, using the distal internal carotid diameter as the denominator. CONTRAST:  50 cc Isovue 370 COMPARISON:  06/24/2016 MRI of the head and MRA of the head. FINDINGS: CT HEAD FINDINGS Brain: Small foci of hypoattenuation within the right cerebellar hemisphere correspond areas of infarction on the prior MRI of the brain. There is no evidence for new large territory infarct, acute intracranial hemorrhage, or focal mass effect. Scattered foci of hypoattenuation in periventricular and subcortical white matter are nonspecific but compatible with mild chronic microvascular ischemic changes. Mild diffuse brain parenchymal volume loss. Vascular: As below. Skull: Normal. Negative for fracture or focal lesion. Sinuses: Imaged portions are clear. Orbits: No acute finding. Review of the MIP images confirms the above findings CTA NECK FINDINGS Aortic arch: Bovine arch, normal variant. Mild calcific atherosclerosis of the arch. Right carotid system: No evidence of dissection, stenosis (50% or greater) or occlusion. Scattered calcified plaque of the common carotid artery and moderate mixed plaque of the carotid bifurcation without significant stenosis. Left carotid system: Moderate to severe mixed plaque of the left carotid bifurcation with severe proximal ICA 70-80% stenosis (series 9, image 161). Vertebral arteries: Left dominant. No evidence of dissection, stenosis (50% or greater) or occlusion. Skeleton: Mild to moderate cervical spondylosis. No high-grade bony canal stenosis. Multiple periapical cysts predominantly of the residual maxillary teeth compatible with odontogenic disease. Other neck: Negative. Upper chest: Negative. Review of the MIP images confirms the above findings CTA HEAD FINDINGS Anterior  circulation: Extensive calcified plaque of the ICA cavernous and paraclinoid segments with moderate to severe right and moderate left paraclinoid stenosis (series 9 image 96). Long segment of moderate left M1 stenosis. Bilateral A1, A2, and right M1 segment irregularity with areas of mild stenosis. Otherwise no large vessel occlusion, aneurysm, or significant stenosis is identified. Posterior circulation: Severe stenosis/ near occlusion of the right V3 4 junction (series 7, image 167) with lumen irregularity and multiple areas of stenosis of the residual right V4 segment. No definite right PICA identified, hypoplastic or occluded. Mild calcified plaque of the left V4 segment with areas of mild stenosis. Severe mid basilar stenosis. Diminutive right P1 segment with large right posterior communicating artery consistent with persistent fetal circulation. Bilateral P2 segments are irregular with multiple areas of mild-to-moderate left and up to severe right stenosis. Otherwise no large vessel occlusion, aneurysm, or significant stenosis is identified. Venous sinuses: As permitted by contrast timing, patent. Anatomic variants: Bilateral posterior communicating arteries and the anterior communicating artery are identified. Delayed phase: No abnormal intracranial enhancement. Review of the MIP images confirms the above findings IMPRESSION: CT head: 1. Foci of hypoattenuation in the right cerebellar hemisphere corresponds to acute infarcts on prior MRI. 2. No evidence for new large territory infarct, acute intracranial hemorrhage, or significant mass effect. 3. Mild chronic microvascular ischemic changes and mild parenchymal volume loss of the brain. CTA  neck: 1. 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 2. Otherwise no large vessel occlusion, aneurysm, or significant stenosis of the carotid and vertebral arteries of the neck is identified. CTA head: 1. Severe stenosis/near occlusion of the right  V3/V4 junction with severe atherosclerotic disease of the right V4 segment. No definite right PICA identified, either hypoplastic or occluded. 2. Severe mid basilar stenosis. 3. Calcified plaque of bilateral internal carotid arteries with moderate to severe right and moderate left paraclinoid stenosis. 4. 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 5. No large vessel occlusion or aneurysm of the circle of Willis identified. Electronically Signed   By: Kristine Garbe M.D.   On: 06/25/2016 02:03   Dg Chest 2 View  Result Date: 06/24/2016 CLINICAL DATA:  Dizziness for 2 days.  Imbalance. EXAM: CHEST  2 VIEW COMPARISON:  01/12/2004. FINDINGS: The heart size and mediastinal contours are within normal limits. Both lungs are clear. The visualized skeletal structures are unremarkable. Calcified tortuous aorta. IMPRESSION: No active cardiopulmonary disease.  Stable appearance from priors. Electronically Signed   By: Staci Righter M.D.   On: 06/24/2016 15:39   Ct Angio Neck W Or Wo Contrast  Result Date: 06/25/2016 CLINICAL DATA:  66 y/o  M; stroke. EXAM: CT ANGIOGRAPHY HEAD AND NECK TECHNIQUE: Multidetector CT imaging of the head and neck was performed using the standard protocol during bolus administration of intravenous contrast. Multiplanar CT image reconstructions and MIPs were obtained to evaluate the vascular anatomy. Carotid stenosis measurements (when applicable) are obtained utilizing NASCET criteria, using the distal internal carotid diameter as the denominator. CONTRAST:  50 cc Isovue 370 COMPARISON:  06/24/2016 MRI of the head and MRA of the head. FINDINGS: CT HEAD FINDINGS Brain: Small foci of hypoattenuation within the right cerebellar hemisphere correspond areas of infarction on the prior MRI of the brain. There is no evidence for new large territory infarct, acute intracranial hemorrhage, or focal mass effect. Scattered foci of  hypoattenuation in periventricular and subcortical white matter are nonspecific but compatible with mild chronic microvascular ischemic changes. Mild diffuse brain parenchymal volume loss. Vascular: As below. Skull: Normal. Negative for fracture or focal lesion. Sinuses: Imaged portions are clear. Orbits: No acute finding. Review of the MIP images confirms the above findings CTA NECK FINDINGS Aortic arch: Bovine arch, normal variant. Mild calcific atherosclerosis of the arch. Right carotid system: No evidence of dissection, stenosis (50% or greater) or occlusion. Scattered calcified plaque of the common carotid artery and moderate mixed plaque of the carotid bifurcation without significant stenosis. Left carotid system: Moderate to severe mixed plaque of the left carotid bifurcation with severe proximal ICA 70-80% stenosis (series 9, image 161). Vertebral arteries: Left dominant. No evidence of dissection, stenosis (50% or greater) or occlusion. Skeleton: Mild to moderate cervical spondylosis. No high-grade bony canal stenosis. Multiple periapical cysts predominantly of the residual maxillary teeth compatible with odontogenic disease. Other neck: Negative. Upper chest: Negative. Review of the MIP images confirms the above findings CTA HEAD FINDINGS Anterior circulation: Extensive calcified plaque of the ICA cavernous and paraclinoid segments with moderate to severe right and moderate left paraclinoid stenosis (series 9 image 96). Long segment of moderate left M1 stenosis. Bilateral A1, A2, and right M1 segment irregularity with areas of mild stenosis. Otherwise no large vessel occlusion, aneurysm, or significant stenosis is identified. Posterior circulation: Severe stenosis/ near occlusion of the right V3 4 junction (series 7, image  167) with lumen irregularity and multiple areas of stenosis of the residual right V4 segment. No definite right PICA identified, hypoplastic or occluded. Mild calcified plaque of the  left V4 segment with areas of mild stenosis. Severe mid basilar stenosis. Diminutive right P1 segment with large right posterior communicating artery consistent with persistent fetal circulation. Bilateral P2 segments are irregular with multiple areas of mild-to-moderate left and up to severe right stenosis. Otherwise no large vessel occlusion, aneurysm, or significant stenosis is identified. Venous sinuses: As permitted by contrast timing, patent. Anatomic variants: Bilateral posterior communicating arteries and the anterior communicating artery are identified. Delayed phase: No abnormal intracranial enhancement. Review of the MIP images confirms the above findings IMPRESSION: CT head: 1. Foci of hypoattenuation in the right cerebellar hemisphere corresponds to acute infarcts on prior MRI. 2. No evidence for new large territory infarct, acute intracranial hemorrhage, or significant mass effect. 3. Mild chronic microvascular ischemic changes and mild parenchymal volume loss of the brain. CTA neck: 1. 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 2. Otherwise no large vessel occlusion, aneurysm, or significant stenosis of the carotid and vertebral arteries of the neck is identified. CTA head: 1. Severe stenosis/near occlusion of the right V3/V4 junction with severe atherosclerotic disease of the right V4 segment. No definite right PICA identified, either hypoplastic or occluded. 2. Severe mid basilar stenosis. 3. Calcified plaque of bilateral internal carotid arteries with moderate to severe right and moderate left paraclinoid stenosis. 4. 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 5. No large vessel occlusion or aneurysm of the circle of Willis identified. Electronically Signed   By: Kristine Garbe M.D.   On: 06/25/2016 02:03   Mr Jodene Nam Head Wo Contrast  Result Date: 06/24/2016 CLINICAL DATA:  Dizziness and unsteady  gait.  Right-sided headache. EXAM: MRI HEAD WITHOUT CONTRAST MRA HEAD WITHOUT CONTRAST TECHNIQUE: Multiplanar, multiecho pulse sequences of the brain and surrounding structures were obtained without intravenous contrast. Angiographic images of the head were obtained using MRA technique without contrast. The examination had to be discontinued prior to completion due to patient discomfort. No susceptibility weighted imaging or coronal T2 weighted imaging was obtained. COMPARISON:  None. FINDINGS: MRI HEAD FINDINGS Brain: There is a shallow sella. There are multiple foci of diffusion restriction within the right cerebellum, within the PICA territory. No other diffusion restriction. There is hyperintense T2 weighted signal within the cerebellum at the site of the above-described diffusion restriction. There is mild multifocal hyperintense T2-weighted signal within the periventricular white matter, most often seen in the setting of chronic microvascular ischemia. No mass lesion. No hydrocephalus, age advanced atrophy or lobar predominant volume loss. No dural abnormality or extra-axial collection. Skull and upper cervical spine: Cervical spine is incompletely visualized but there appears to be moderate stenosis at the C3-C4 level. Sinuses/Orbits: No fluid levels or advanced mucosal thickening. No mastoid effusion. Normal orbits. MRA HEAD FINDINGS Intracranial internal carotid arteries: Symmetric bilateral narrowing of the internal carotid arteries just proximal to the skullbase is suspected to be artifactual. Otherwise, they are normal. Anterior cerebral arteries: Normal. Middle cerebral arteries: There is mild, diffuse narrowing of the left M1 segment. There is multifocal stenosis within the distal MCA distribution bilaterally. Posterior communicating arteries: Present bilaterally, larger on the right. Posterior cerebral arteries: There is multifocal moderate to severe narrowing of both P2 segments. Basilar artery:  There is multifocal narrowing of the basilar artery. Vertebral arteries: Left  dominant. There is limited flow related enhancement seen within the diminutive right vertebral artery, which terminates as it gives rise to the PICA. The left vertebral artery is normal. Superior cerebellar arteries: Normal. Anterior inferior cerebellar arteries: Normal. Posterior inferior cerebellar arteries: The right PICA's flow related enhancement is lost just distal to its origin from the right vertebral artery. The left PICA is normal. IMPRESSION: 1. Multifocal acute ischemia within the right cerebellar hemisphere, located within the the right posterior inferior cerebellar artery territory. Mild edema, but no hemorrhage or mass effect. 2. 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 3. Extensive, multifocal intracranial atherosclerosis with moderate stenoses of multiple distal MCA branches, the basilar artery and the bilateral posterior cerebral artery P2 segments. 4. Incompletely visualized moderate spinal canal stenosis at C3-4. Consider further evaluation with MRI of the cervical spine, if clinically warranted. Electronically Signed   By: Ulyses Jarred M.D.   On: 06/24/2016 15:51   Mr Brain Wo Contrast  Result Date: 06/24/2016 CLINICAL DATA:  Dizziness and unsteady gait.  Right-sided headache. EXAM: MRI HEAD WITHOUT CONTRAST MRA HEAD WITHOUT CONTRAST TECHNIQUE: Multiplanar, multiecho pulse sequences of the brain and surrounding structures were obtained without intravenous contrast. Angiographic images of the head were obtained using MRA technique without contrast. The examination had to be discontinued prior to completion due to patient discomfort. No susceptibility weighted imaging or coronal T2 weighted imaging was obtained. COMPARISON:  None. FINDINGS: MRI HEAD FINDINGS Brain: There is a shallow sella. There are multiple foci of diffusion restriction within the right cerebellum, within the PICA territory. No other  diffusion restriction. There is hyperintense T2 weighted signal within the cerebellum at the site of the above-described diffusion restriction. There is mild multifocal hyperintense T2-weighted signal within the periventricular white matter, most often seen in the setting of chronic microvascular ischemia. No mass lesion. No hydrocephalus, age advanced atrophy or lobar predominant volume loss. No dural abnormality or extra-axial collection. Skull and upper cervical spine: Cervical spine is incompletely visualized but there appears to be moderate stenosis at the C3-C4 level. Sinuses/Orbits: No fluid levels or advanced mucosal thickening. No mastoid effusion. Normal orbits. MRA HEAD FINDINGS Intracranial internal carotid arteries: Symmetric bilateral narrowing of the internal carotid arteries just proximal to the skullbase is suspected to be artifactual. Otherwise, they are normal. Anterior cerebral arteries: Normal. Middle cerebral arteries: There is mild, diffuse narrowing of the left M1 segment. There is multifocal stenosis within the distal MCA distribution bilaterally. Posterior communicating arteries: Present bilaterally, larger on the right. Posterior cerebral arteries: There is multifocal moderate to severe narrowing of both P2 segments. Basilar artery: There is multifocal narrowing of the basilar artery. Vertebral arteries: Left dominant. There is limited flow related enhancement seen within the diminutive right vertebral artery, which terminates as it gives rise to the PICA. The left vertebral artery is normal. Superior cerebellar arteries: Normal. Anterior inferior cerebellar arteries: Normal. Posterior inferior cerebellar arteries: The right PICA's flow related enhancement is lost just distal to its origin from the right vertebral artery. The left PICA is normal. IMPRESSION: 1. Multifocal acute ischemia within the right cerebellar hemisphere, located within the the right posterior inferior cerebellar  artery territory. Mild edema, but no hemorrhage or mass effect. 2. 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 3. Extensive, multifocal intracranial atherosclerosis with moderate stenoses of multiple distal MCA branches, the basilar artery and the bilateral posterior cerebral artery P2 segments. 4. Incompletely visualized moderate spinal canal stenosis at C3-4. Consider further evaluation with MRI  of the cervical spine, if clinically warranted. Electronically Signed   By: Ulyses Jarred M.D.   On: 06/24/2016 15:51    echocardiogram  LV EF: 45% -   50%  ------------------------------------------------------------------- Indications:      CVA 436.  ------------------------------------------------------------------- History:   PMH:  Dizziness. Former alcohol use.  Risk factors: Former tobacco use. Hypertension.  ------------------------------------------------------------------- Study Conclusions  - Left ventricle: The cavity size was normal. Wall thickness was   increased in a pattern of moderate LVH. Systolic function was   mildly reduced. The estimated ejection fraction was in the range   of 45% to 50%. There is akinesis and aneurysmal deformity of the   basalinferoseptal myocardium. Doppler parameters are consistent   with abnormal left ventricular relaxation (grade 1 diastolic   dysfunction). - Aortic valve: There was mild regurgitation directed towards the   mitral anterior leaflet. - Aorta: Aortic root dimension: 45 mm (ED).  Impressions:  - No cardiac source of emboli was indentified.      Filed Weights   06/24/16 1315 06/24/16 2151  Weight: 100.7 kg (222 lb) 98.6 kg (217 lb 4.8 oz)        Labs: Results for orders placed or performed during the hospital encounter of 06/24/16 (from the past 48 hour(s))  CBC with Differential     Status: Abnormal   Collection Time: 06/24/16  1:20 PM  Result Value Ref Range   WBC 9.5 4.0 - 10.5 K/uL   RBC 4.45 4.22 -  5.81 MIL/uL   Hemoglobin 13.3 13.0 - 17.0 g/dL   HCT 38.2 (L) 39.0 - 52.0 %   MCV 85.8 78.0 - 100.0 fL   MCH 29.9 26.0 - 34.0 pg   MCHC 34.8 30.0 - 36.0 g/dL   RDW 13.0 11.5 - 15.5 %   Platelets 294 150 - 400 K/uL   Neutrophils Relative % 58 %   Neutro Abs 5.6 1.7 - 7.7 K/uL   Lymphocytes Relative 28 %   Lymphs Abs 2.6 0.7 - 4.0 K/uL   Monocytes Relative 8 %   Monocytes Absolute 0.8 0.1 - 1.0 K/uL   Eosinophils Relative 5 %   Eosinophils Absolute 0.5 0.0 - 0.7 K/uL   Basophils Relative 1 %   Basophils Absolute 0.1 0.0 - 0.1 K/uL  Basic metabolic panel     Status: Abnormal   Collection Time: 06/24/16  1:20 PM  Result Value Ref Range   Sodium 136 135 - 145 mmol/L   Potassium 4.2 3.5 - 5.1 mmol/L   Chloride 103 101 - 111 mmol/L   CO2 24 22 - 32 mmol/L   Glucose, Bld 118 (H) 65 - 99 mg/dL   BUN 27 (H) 6 - 20 mg/dL   Creatinine, Ser 1.24 0.61 - 1.24 mg/dL   Calcium 9.4 8.9 - 10.3 mg/dL   GFR calc non Af Amer 59 (L) >60 mL/min   GFR calc Af Amer >60 >60 mL/min    Comment: (NOTE) The eGFR has been calculated using the CKD EPI equation. This calculation has not been validated in all clinical situations. eGFR's persistently <60 mL/min signify possible Chronic Kidney Disease.    Anion gap 9 5 - 15  Troponin I     Status: None   Collection Time: 06/24/16  1:20 PM  Result Value Ref Range   Troponin I <0.03 <0.03 ng/mL  Protime-INR     Status: None   Collection Time: 06/24/16  1:30 PM  Result Value Ref Range   Prothrombin  Time 12.5 11.4 - 15.2 seconds   INR 0.93   APTT     Status: None   Collection Time: 06/24/16  1:30 PM  Result Value Ref Range   aPTT 35 24 - 36 seconds  Urine rapid drug screen (hosp performed)not at [facility]     Status: None   Collection Time: 06/24/16  4:00 PM  Result Value Ref Range   Opiates NONE DETECTED NONE DETECTED   Cocaine NONE DETECTED NONE DETECTED   Benzodiazepines NONE DETECTED NONE DETECTED   Amphetamines NONE DETECTED NONE DETECTED    Tetrahydrocannabinol NONE DETECTED NONE DETECTED   Barbiturates NONE DETECTED NONE DETECTED    Comment:        DRUG SCREEN FOR MEDICAL PURPOSES ONLY.  IF CONFIRMATION IS NEEDED FOR ANY PURPOSE, NOTIFY LAB WITHIN 5 DAYS.        LOWEST DETECTABLE LIMITS FOR URINE DRUG SCREEN Drug Class       Cutoff (ng/mL) Amphetamine      1000 Barbiturate      200 Benzodiazepine   789 Tricyclics       381 Opiates          300 Cocaine          300 THC              50   Urinalysis, Routine w reflex microscopic     Status: Abnormal   Collection Time: 06/24/16  4:00 PM  Result Value Ref Range   Color, Urine STRAW (A) YELLOW   APPearance CLEAR CLEAR   Specific Gravity, Urine 1.014 1.005 - 1.030   pH 5.0 5.0 - 8.0   Glucose, UA NEGATIVE NEGATIVE mg/dL   Hgb urine dipstick NEGATIVE NEGATIVE   Bilirubin Urine NEGATIVE NEGATIVE   Ketones, ur NEGATIVE NEGATIVE mg/dL   Protein, ur NEGATIVE NEGATIVE mg/dL   Nitrite NEGATIVE NEGATIVE   Leukocytes, UA NEGATIVE NEGATIVE  Glucose, capillary     Status: Abnormal   Collection Time: 06/24/16 10:16 PM  Result Value Ref Range   Glucose-Capillary 111 (H) 65 - 99 mg/dL   Comment 1 Notify RN    Comment 2 Document in Chart   TSH     Status: None   Collection Time: 06/24/16 10:49 PM  Result Value Ref Range   TSH 3.086 0.350 - 4.500 uIU/mL    Comment: Performed by a 3rd Generation assay with a functional sensitivity of <=0.01 uIU/mL.  Lipid panel     Status: Abnormal   Collection Time: 06/25/16  4:25 AM  Result Value Ref Range   Cholesterol 220 (H) 0 - 200 mg/dL   Triglycerides 95 <150 mg/dL   HDL 40 (L) >40 mg/dL   Total CHOL/HDL Ratio 5.5 RATIO   VLDL 19 0 - 40 mg/dL   LDL Cholesterol 161 (H) 0 - 99 mg/dL    Comment:        Total Cholesterol/HDL:CHD Risk Coronary Heart Disease Risk Table                     Men   Women  1/2 Average Risk   3.4   3.3  Average Risk       5.0   4.4  2 X Average Risk   9.6   7.1  3 X Average Risk  23.4   11.0         Use the calculated Patient Ratio above and the CHD Risk Table to determine the patient's CHD Risk.  ATP III CLASSIFICATION (LDL):  <100     mg/dL   Optimal  100-129  mg/dL   Near or Above                    Optimal  130-159  mg/dL   Borderline  160-189  mg/dL   High  >190     mg/dL   Very High   Hemoglobin A1c     Status: Abnormal   Collection Time: 06/25/16  4:25 AM  Result Value Ref Range   Hgb A1c MFr Bld 6.4 (H) 4.8 - 5.6 %    Comment: (NOTE)         Pre-diabetes: 5.7 - 6.4         Diabetes: >6.4         Glycemic control for adults with diabetes: <7.0    Mean Plasma Glucose 137 mg/dL    Comment: (NOTE) Performed At: [facility] [place], [place] [id number] Lindon Romp MD IO:[id number]   Glucose, capillary     Status: Abnormal   Collection Time: 06/25/16  6:12 AM  Result Value Ref Range   Glucose-Capillary 115 (H) 65 - 99 mg/dL   Comment 1 Notify RN    Comment 2 Document in Chart   Glucose, capillary     Status: Abnormal   Collection Time: 06/25/16 11:34 AM  Result Value Ref Range   Glucose-Capillary 113 (H) 65 - 99 mg/dL  Glucose, capillary     Status: None   Collection Time: 06/25/16  4:29 PM  Result Value Ref Range   Glucose-Capillary 89 65 - 99 mg/dL  Glucose, capillary     Status: Abnormal   Collection Time: 06/25/16  9:20 PM  Result Value Ref Range   Glucose-Capillary 109 (H) 65 - 99 mg/dL   Comment 1 Notify RN    Comment 2 Document in Chart   CBC     Status: Abnormal   Collection Time: 06/26/16  3:22 AM  Result Value Ref Range   WBC 6.4 4.0 - 10.5 K/uL   RBC 4.12 (L) 4.22 - 5.81 MIL/uL   Hemoglobin 12.4 (L) 13.0 - 17.0 g/dL   HCT 35.3 (L) 39.0 - 52.0 %   MCV 85.7 78.0 - 100.0 fL   MCH 30.1 26.0 - 34.0 pg   MCHC 35.1 30.0 - 36.0 g/dL   RDW 13.2 11.5 - 15.5 %   Platelets 271 150 - 400 K/uL  Comprehensive metabolic panel     Status: Abnormal   Collection Time: 06/26/16  3:22 AM  Result Value Ref Range   Sodium  135 135 - 145 mmol/L   Potassium 4.1 3.5 - 5.1 mmol/L   Chloride 102 101 - 111 mmol/L   CO2 23 22 - 32 mmol/L   Glucose, Bld 113 (H) 65 - 99 mg/dL   BUN 16 6 - 20 mg/dL   Creatinine, Ser 1.11 0.61 - 1.24 mg/dL   Calcium 9.0 8.9 - 10.3 mg/dL   Total Protein 6.8 6.5 - 8.1 g/dL   Albumin 3.5 3.5 - 5.0 g/dL   AST 30 15 - 41 U/L   ALT 21 17 - 63 U/L   Alkaline Phosphatase 58 38 - 126 U/L   Total Bilirubin 0.5 0.3 - 1.2 mg/dL   GFR calc non Af Amer >60 >60 mL/min   GFR calc Af Amer >60 >60 mL/min    Comment: (NOTE) The eGFR has been calculated using the CKD EPI equation. This  calculation has not been validated in all clinical situations. eGFR's persistently <60 mL/min signify possible Chronic Kidney Disease.    Anion gap 10 5 - 15  Glucose, capillary     Status: Abnormal   Collection Time: 06/26/16  6:03 AM  Result Value Ref Range   Glucose-Capillary 109 (H) 65 - 99 mg/dL   Comment 1 Notify RN    Comment 2 Document in Chart      Lipid Panel     Component Value Date/Time   CHOL 220 (H) 06/25/2016 0425   TRIG 95 06/25/2016 0425   HDL 40 (L) 06/25/2016 0425   CHOLHDL 5.5 06/25/2016 0425   VLDL 19 06/25/2016 0425   LDLCALC 161 (H) 06/25/2016 0425     Lab Results  Component Value Date   HGBA1C 6.4 (H) 06/25/2016       HPI :  66 y.o. male with no significant past medical history - has not seen a physician in many years.  Patient started feeling dizzy on Monday when he got up.  He also felt dizzy this AM when he got up.  Symptoms lasted about 30 minutes both days and went away completely.  He didn't have any balance issues.He presented [facility] where an MRI was performed which demonstrated right cerebellar ischemia. He was therefore transferred to [facility] for further evaluation.He was LKW 06/22/16 prior to bed. Patient was not administered IV t-PA secondary to being outside of window. He was admitted for further evaluation and treatment.  HOSPITAL COURSE:   Stroke: Right  cerebellarinfarct small patchy in setting of severe intracranial and extracranial atherosclerosis  Resultant No significant residue deficit  MRIR cerebellar infarct.   MRA headR PICA occlusion vs stenosis. Extensive intracranial atherosclerosis. Moderate stenosis B siphons, B MCAs, BA and B P2s.   CT head right cerebellar hypoattenuation.Mild small vessel disease and atrophy  CTA head severe stenosis/near occlusion R V3/V4 junction. Severe stenosis R V4. R PICA not seen. Severe mid BA stenosis. Moderate to severe right and moderate left paraclinoid stenosis. Extensive atherosclerosis circle of Willis, greatest L M1 and R P2. Right VA origin stenosis  CTA neck L ICA 70-80% stenosis with mixed plaque.  2D Echoresults as above  LDL161  HgbA1c 6.4  UDS negative  Lovenox 40 mg sq daily for VTE prophylaxis  Diet heart healthy/carb modified Room service appropriate? Yes; Fluid consistency: Thin  No antithromboticprior to admission, now on aspirin 325 mg daily and clopidogrel 75 mg daily. Continue DAPT for 3 months and then plavix alone due to severe intracranial stenosis.  Home health recommended Pt will follow up with carolyn Hassell Done [facility] in about 6 weeks    HTN -Allow permissive HTN, currently controlled    HLD LDL 161 Continue Lipitor  cardiomyopathy Ef reduced 45-50% Will start low dose coreg and ACE and cardiology consulted prior to dc  Cards recommend outpatient lexiscan myoview to assess for ischemia and possible prior inferior infarct   Discharge Exam:  Blood pressure 136/78, pulse 75, temperature 98.5 F (36.9 C), temperature source Oral, resp. rate 20, height 6' 2"  (1.88 m), weight 98.6 kg (217 lb 4.8 oz), SpO2 98 %.  General exam: Appears calm and comfortable  Respiratory system: Clear to auscultation. Respiratory effort normal. Cardiovascular system: S1 & S2 heard, RRR. No JVD, murmurs, rubs, gallops or clicks. No pedal  edema. Gastrointestinal system: Abdomen is nondistended, soft and nontender. No organomegaly or masses felt. Normal bowel sounds heard. Central nervous system: Alert and oriented. No  focal neurological deficits. Extremities: Symmetric 5 x 5 power. Skin: No rashes, lesions or ulcers Psychiatry: Judgement and insight appear normal. Mood & affect appropriate.      Follow-up Information    Dennie Bible, NP. Schedule an appointment as soon as possible for a visit in 6 week(s).   Specialty:  Family Medicine Contact information: [address] [place] [address] [phone]        Raylene Everts, MD Follow up on 07/08/2016.   Specialty:  Family Medicine Why:  Your appointment time is 3 pm.  Contact information: [address] [place] [address] [phone]           Signed: Reyne Dumas 06/26/2016, 9:26 AM        Time spent >45 mins

## 2016-06-26 NOTE — Discharge Instructions (Signed)
° °  You have a Stress Test scheduled at [facility]. Your doctor has ordered this test to check the blood flow in your heart arteries. ° °Please arrive 15 minutes early for paperwork. The whole test will take several hours. You may want to bring reading material to remain occupied while undergoing different parts of the test. ° °Instructions: °· No food/drink after midnight the night before. °· It is OK to take your morning meds with a sip of water EXCEPT for those types of medicines listed below or otherwise instructed. °· No caffeine/decaf products 24 hours before, including medicines such as Excedrin or Goody Powders. Call if there are any questions.  °· Wear comfortable clothes and shoes.  ° °Special Medication Instructions: °· Beta blockers such as metoprolol (Lopressor/Toprol XL), atenolol (Tenormin), carvedilol (Coreg), nebivolol (Bystolic), bisoprolol (Zebeta), propranolol (Inderal) should not be taken for 24 hours before the test. °· Calcium channel blockers such as diltiazem (Cardizem) or verapmil (Calan) should not be taken for 24 hours before the test. °· Remove nitroglycerin patches and do not take nitrate preparations such as Imdur/isosorbide the day of your test. °· No Persantine/Theophylline or Aggrenox medicines should be used within 24 hours of the test.  °· If you are diabetic, please ask which medications to hold the day of the test ° °What To Expect: °When you arrive in the lab, the technician will inject a small amount of radioactive tracer into your arm through an IV while you are resting quietly. This helps us to form pictures of your heart. You will likely only feel a sting from the IV. After a waiting period, resting pictures will be obtained under a big camera. These are the "before" pictures. ° °Next, you will be prepped for the stress portion of the test. This may include either walking on a treadmill or receiving a medicine that helps to dilate blood vessels in  your heart to simulate the effect of exercise on your heart. If you are walking on a treadmill, you will walk at different paces to try to get your heart rate to a goal number that is based on your age. If your doctor has chosen the pharmacologic test, then you will receive a medicine through your IV that may cause temporary nausea, flushing, shortness of breath and sometimes chest discomfort or vomiting. This is typically short-lived and usually resolves quickly. If you experience symptoms, that does not automatically mean the test is abnormal. Some patients do not experience any symptoms at all. Your blood pressure and heart rate will be monitored, and we will be watching your EKG on a computer screen for any changes. During this portion of the test, the radiologist will inject another small amount of radioactive tracer into your IV. After a waiting period, you will undergo a second set of pictures. These are the "after" pictures. ° °The doctor reading the test will compare the before-and-after images to look for evidence of heart blockages or heart weakness. The test usually takes 1 day to complete, but in certain instances (for example, if a patient is over a certain weight limit), the test may be done over the span of 2 days. ° ° °

## 2016-06-26 NOTE — Clinical Social Work Note (Signed)
CSW consulted for SNF placement. P/T rec HHPT and O/T rec HHOT. RNCM aware. CSW signing off as no further SW needs identified. Please reconsult if new SW needs arise.   Oretha Ellis, [place], [place] Work [phone]

## 2016-06-26 NOTE — Progress Notes (Signed)
Patient slept well last pm.  No complaints of pain or discomfort voiced.  Cardiac rhythm remained unchanged.  No neurological changes noted.  Pt stated he usually experiences dizziness in the morning that lasts for a short period then resolves.  He denied experiencing  dizziness as the day progressed.  All questions and concerns addressed.  Will continue to monitor.

## 2016-06-27 ENCOUNTER — Other Ambulatory Visit: Payer: Self-pay

## 2016-07-01 ENCOUNTER — Other Ambulatory Visit: Payer: Self-pay

## 2016-07-01 DIAGNOSIS — R079 Chest pain, unspecified: Secondary | ICD-10-CM

## 2016-07-03 ENCOUNTER — Encounter (HOSPITAL_COMMUNITY): Payer: Self-pay

## 2016-07-03 ENCOUNTER — Encounter (HOSPITAL_BASED_OUTPATIENT_CLINIC_OR_DEPARTMENT_OTHER)
Admission: RE | Admit: 2016-07-03 | Discharge: 2016-07-03 | Disposition: A | Discharge status: Home / Self Care | Payer: Medicare HMO | Source: Ambulatory Visit | Attending: Cardiology | Admitting: Cardiology

## 2016-07-03 ENCOUNTER — Encounter (HOSPITAL_COMMUNITY)
Admission: RE | Admit: 2016-07-03 | Discharge: 2016-07-03 | Disposition: A | Discharge status: Home / Self Care | Payer: Medicare HMO | Source: Ambulatory Visit | Attending: Cardiology | Admitting: Cardiology

## 2016-07-03 DIAGNOSIS — R079 Chest pain, unspecified: Secondary | ICD-10-CM | POA: Insufficient documentation

## 2016-07-03 LAB — NM MYOCAR MULTI W/SPECT W/WALL MOTION / EF
LV dias vol: 142 mL (ref 62–150)
LV sys vol: 74 mL
Peak HR: 84 {beats}/min
RATE: 0.35
Rest HR: 59 {beats}/min
SDS: 1
SRS: 10
SSS: 10
TID: 0.97

## 2016-07-03 MED ORDER — REGADENOSON 0.4 MG/5ML IV SOLN
INTRAVENOUS | Status: AC
  Administered 2016-07-03: 0.4 mg via INTRAVENOUS
  Filled 2016-07-03: qty 5

## 2016-07-03 MED ORDER — TECHNETIUM TC 99M TETROFOSMIN IV KIT
10.0000 | PACK | Freq: Once | INTRAVENOUS | Status: AC | PRN
  Administered 2016-07-03: 10 via INTRAVENOUS

## 2016-07-03 MED ORDER — SODIUM CHLORIDE 0.9% FLUSH
INTRAVENOUS | Status: AC
  Administered 2016-07-03: 10 mL via INTRAVENOUS
  Filled 2016-07-03: qty 10

## 2016-07-03 MED ORDER — TECHNETIUM TC 99M TETROFOSMIN IV KIT
30.0000 | PACK | Freq: Once | INTRAVENOUS | Status: AC | PRN
  Administered 2016-07-03: 30 via INTRAVENOUS

## 2016-07-07 ENCOUNTER — Ambulatory Visit (INDEPENDENT_AMBULATORY_CARE_PROVIDER_SITE_OTHER): Payer: Medicare HMO | Admitting: Cardiology

## 2016-07-07 ENCOUNTER — Encounter: Payer: Self-pay | Admitting: Cardiology

## 2016-07-07 VITALS — BP 138/82 | HR 69 | Ht 74.0 in | Wt 216.0 lb

## 2016-07-07 DIAGNOSIS — I10 Essential (primary) hypertension: Secondary | ICD-10-CM

## 2016-07-07 DIAGNOSIS — R42 Dizziness and giddiness: Secondary | ICD-10-CM

## 2016-07-07 DIAGNOSIS — I639 Cerebral infarction, unspecified: Secondary | ICD-10-CM

## 2016-07-07 DIAGNOSIS — I255 Ischemic cardiomyopathy: Secondary | ICD-10-CM

## 2016-07-07 DIAGNOSIS — E785 Hyperlipidemia, unspecified: Secondary | ICD-10-CM

## 2016-07-07 DIAGNOSIS — I6522 Occlusion and stenosis of left carotid artery: Secondary | ICD-10-CM

## 2016-07-07 NOTE — Assessment & Plan Note (Signed)
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

## 2016-07-07 NOTE — Progress Notes (Signed)
07/07/2016 Albert Morris   [birth year]  [id number]  Primary Physician Raylene Everts, MD Primary Cardiologist: Einar Grad cardiologist  HPI:  Albert Morris is a 66 yo AA male with little medical care in the past 10+ years. 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 He has was noted to be hypertensive on admission and was a former smoker, but quit 12 years ago. CTA of the neck showed 70-80% left proximal ICA stenosis -it was not felt to be causing his symptoms but needs to be addressed by vascular surgery as an outpatient. An echo was performed, which shows an EF of 45-50% with inferior akinesis. He denies prior MI. No history of chest pain. The pt was placed on Coreg, Lisinopril, Lipitor, ASA, and Plavix. He is to see the neurologist in a few weeks, the plan was for duel anti DAPT for at least three months, then Plavix alone.   He is in the office today for cardiology follow up. He denies any chest pain or unusual dyspnea. He is still dizzy, though not as bad. He had a Myoview 07/03/16 that showed inferior scar without ischemia.    Current Outpatient Prescriptions  Medication Sig Dispense Refill  . aspirin 325 MG tablet Take 1 tablet (325 mg total) by mouth daily. 30 tablet 3  . atorvastatin (LIPITOR) 40 MG tablet Take 1 tablet (40 mg total) by mouth daily at 6 PM. 30 tablet 3  . carvedilol (COREG) 3.125 MG tablet Take 1 tablet (3.125 mg total) by mouth 2 (two) times daily with a meal. 60 tablet 1  . clopidogrel (PLAVIX) 75 MG tablet Take 1 tablet (75 mg total) by mouth daily. 30 tablet 3  . lisinopril (PRINIVIL,ZESTRIL) 5 MG tablet Take 1 tablet (5 mg total) by mouth daily. 30 tablet 1  . pantoprazole (PROTONIX) 40 MG tablet Take 1 tablet (40 mg total) by mouth daily. 30 tablet 1   No current facility-administered medications for this visit.     No Known  Allergies  Past Medical History:  Diagnosis Date  . CVA (cerebral vascular accident) ([place]) 06/21/2016  . Hypertension     Social History   Social History  . Marital status: Divorced    Spouse name: N/A  . Number of children: N/A  . Years of education: N/A   Occupational History  . retired    Social History Main Topics  . Smoking status: Former Smoker    Packs/day: 0.50    Years: 18.00    Quit date: 2006  . Smokeless tobacco: Never Used  . Alcohol use No     Comment: h/o heavy use  . Drug use: No     Comment: remote h/o heavy marijuana use, also used cocaine and others  . Sexual activity: Not on file   Other Topics Concern  . Not on file   Social History Narrative  . No narrative on file     Family History  Problem Relation Age of Onset  . Breast cancer Mother        She is 16 years old, in [place]  . CVA Mother   . CAD Mother   . Other Father        gunshot wound  . Cancer Brother   . Cancer Brother      Review of Systems: General: negative for chills, fever, night sweats or weight changes.  Cardiovascular:  negative for chest pain, dyspnea on exertion, edema, orthopnea, palpitations, paroxysmal nocturnal dyspnea or shortness of breath Dermatological: negative for rash Respiratory: negative for cough or wheezing Urologic: negative for hematuria Abdominal: negative for nausea, vomiting, diarrhea, bright red blood per rectum, melena, or hematemesis Neurologic: negative for visual changes, syncope All other systems reviewed and are otherwise negative except as noted above.    Blood pressure 138/82, pulse 69, height 6\' 2"  (1.88 m), weight 216 lb (98 kg), SpO2 93 %.  General appearance: alert, cooperative and no distress Neck: no JVD and LCA bruit Lungs: clear to auscultation bilaterally Heart: regular rate and rhythm Extremities: extremities normal, atraumatic, no cyanosis or edema Skin: Skin color, texture, turgor normal. No rashes or lesions Neurologic:  Grossly normal   ASSESSMENT AND PLAN:   Cerebellar stroke ([place]) Admitted with dizziness and "falling to the left" 06/24/16. Work up revealed multifocal acute ischemia within the right cerebellar hemisphere with occlusion versus severe stenosis of the right PICA and extensive, multifocal intracranial atherosclerosis with moderate stenoses of multiple distal MCA branches.  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 Not as bad as when he was admitted   PLAN  Same cardiac Rx. Consider decreasing ASA to 81 mg daily- will defer to Neurology. I will facilitate vascular evaluation. No further cardiac work up unless he develops angina. F/U with MD in [place] in 6 weeks or so.   Kerin Ransom PA-C 07/07/2016 3:27 PM

## 2016-07-07 NOTE — Assessment & Plan Note (Signed)
Persistent dizziness, he thought it may be from medication (listed side effect) but I suspect it's secondary to his posterior circulation disease. Not as bad as when he was admitted

## 2016-07-07 NOTE — Assessment & Plan Note (Signed)
BP under better control.

## 2016-07-07 NOTE — Patient Instructions (Signed)
Your physician recommends that you schedule a follow-up appointment in: 3 Months  Your physician recommends that you continue on your current medications as directed. Please refer to the Current Medication list given to you today.  You have been referred to Vascular Surgery   If you need a refill on your cardiac medications before your next appointment, please call your pharmacy.  Thank you for choosing [facility]!

## 2016-07-07 NOTE — Assessment & Plan Note (Signed)
Admitted with dizziness and "falling to the left" 06/24/16. Work up revealed multifocal acute ischemia within the right cerebellar hemisphere with occlusion versus severe stenosis of the right PICA and extensive, multifocal intracranial atherosclerosis with moderate stenoses of multiple distal MCA branches.

## 2016-07-07 NOTE — Assessment & Plan Note (Signed)
89-38% LICA by CTA, not felt to be symptomatic at this time but further vascular evaluation recomended

## 2016-07-08 ENCOUNTER — Ambulatory Visit (INDEPENDENT_AMBULATORY_CARE_PROVIDER_SITE_OTHER): Payer: Medicare HMO | Admitting: Family Medicine

## 2016-07-08 ENCOUNTER — Encounter: Payer: Self-pay | Admitting: Family Medicine

## 2016-07-08 VITALS — BP 136/76 | HR 68 | Temp 97.5°F | Resp 16 | Ht 74.0 in | Wt 216.0 lb

## 2016-07-08 DIAGNOSIS — F1911 Other psychoactive substance abuse, in remission: Secondary | ICD-10-CM

## 2016-07-08 DIAGNOSIS — Z87898 Personal history of other specified conditions: Secondary | ICD-10-CM | POA: Diagnosis not present

## 2016-07-08 DIAGNOSIS — I25118 Atherosclerotic heart disease of native coronary artery with other forms of angina pectoris: Secondary | ICD-10-CM | POA: Insufficient documentation

## 2016-07-08 DIAGNOSIS — R7303 Prediabetes: Secondary | ICD-10-CM | POA: Diagnosis not present

## 2016-07-08 DIAGNOSIS — I2510 Atherosclerotic heart disease of native coronary artery without angina pectoris: Secondary | ICD-10-CM | POA: Diagnosis not present

## 2016-07-08 DIAGNOSIS — I252 Old myocardial infarction: Secondary | ICD-10-CM | POA: Diagnosis not present

## 2016-07-08 DIAGNOSIS — R399 Unspecified symptoms and signs involving the genitourinary system: Secondary | ICD-10-CM | POA: Diagnosis not present

## 2016-07-08 DIAGNOSIS — Z1159 Encounter for screening for other viral diseases: Secondary | ICD-10-CM

## 2016-07-08 NOTE — Patient Instructions (Signed)
You are due for some health testing that we will do over the next year as you recover You need shots for tetanus, pneumonia, and in the fall a flu shot You can get shingles shot at the pharmacy  STOP the pantoprazole.  This is for heartburn  Need blood work in three months See me after blood testing

## 2016-07-08 NOTE — Progress Notes (Signed)
Chief Complaint  Patient presents with  . Hypertension  . Diabetes   Quit going to the doctor in 2004 because of insurance difficulty. Prior alcoholic and substance abuse stopped in 2006 He was unaware he had blood pressure or cholesterol issues until he had a stroke. hospitalized for dizziness and found to have posterior circulation blockage and occipital stroke  Admit date: 06/24/2016 Discharge date: 06/26/2016  Discharge Diagnoses:    Principal Problem:   CVA (cerebral vascular accident) [facility])  He was found to have cerebrovascular disease, cardiovascular disease, MI, pre diabetes, CVA, hypertension and dyslipidemia.  He is home recovering Following medical instructions Trying to eat a heart healthy diet Walking every day to tolerance Follow up with cardiology yesterday Follow up with neurology and vascular surgery confirmed.  No health maintenance in years Due for: Colonoscopy AAA screen Immunizations-tetanus, flu, pneumoniaX2, shingles Eye exam   Patient Active Problem List   Diagnosis Date Noted  . History of MI (myocardial infarction) 07/08/2016  . CAD in native artery 07/08/2016  . Pre-diabetes 07/08/2016  . History of substance abuse 07/08/2016  . Essential hypertension 07/07/2016  . Dyslipidemia 07/07/2016  . Dizziness   . Stenosis of left carotid artery   . Cardiomyopathy ([place])   . Cerebellar stroke ([place]) 06/24/2016    Outpatient Encounter Prescriptions as of 07/08/2016  Medication Sig  . aspirin 325 MG tablet Take 1 tablet (325 mg total) by mouth daily.  [name] atorvastatin (LIPITOR) 40 MG tablet Take 1 tablet (40 mg total) by mouth daily at 6 PM.  . carvedilol (COREG) 3.125 MG tablet Take 1 tablet (3.125 mg total) by mouth 2 (two) times daily with a meal.  . clopidogrel (PLAVIX) 75 MG tablet Take 1 tablet (75 mg total) by mouth daily.  [name] lisinopril (PRINIVIL,ZESTRIL) 5 MG tablet Take 1 tablet (5 mg total) by mouth daily.  . [DISCONTINUED] pantoprazole  (PROTONIX) 40 MG tablet Take 1 tablet (40 mg total) by mouth daily.   No facility-administered encounter medications on file as of 07/08/2016.     Past Medical History:  Diagnosis Date  . CVA (cerebral vascular accident) ([place]) 06/21/2016  . Hyperlipidemia   . Hypertension   . Myocardial infarction ([place])   . Substance abuse     Past Surgical History:  Procedure Laterality Date  . [place]   growth removed  . TONSILLECTOMY      Social History   Social History  . Marital status: Divorced    Spouse name: N/A  . Number of children: 2  . Years of education: 12   Occupational History  . disabled     alcoholic   Social History Main Topics  . Smoking status: Former Smoker    Packs/day: 0.50    Years: 29.00    Quit date: 2006  . Smokeless tobacco: Never Used  . Alcohol use No     Comment: quit 2006  . Drug use: No     Comment: remote h/o heavy marijuana use, also used cocaine and others  . Sexual activity: Not Currently   Other Topics Concern  . Not on file   Social History Narrative   Divorced   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army for 8 years, EMT certified      Lives alone   Likes to walk      2 sons -in [place] and [place] (does not see)       Family History  Problem Relation Age of Onset  . Breast cancer  Mother        She is 9 years old, in [place]  . CVA Mother   . CAD Mother   . Arthritis Mother   . Cancer Mother   . Hypertension Mother   . Other Father        gunshot wound  . Early death Father        GSW  . Cancer Brother   . Cancer Brother   . Stroke Maternal Aunt   . Alcohol abuse Maternal Uncle   . Stroke Maternal Grandfather     Review of Systems  Constitutional: Positive for malaise/fatigue. Negative for chills, fever and weight loss.  HENT: Negative for congestion and hearing loss.   Eyes: Negative for blurred vision and pain.  Respiratory: Positive for cough. Negative for shortness of breath.        Some white sputum - for years    Cardiovascular: Negative for chest pain and leg swelling.  Gastrointestinal: Negative for abdominal pain, constipation, diarrhea and heartburn.  Genitourinary: Negative for dysuria and frequency.  Musculoskeletal: Negative for falls, joint pain and myalgias.  Neurological: Positive for dizziness and weakness. Negative for speech change, focal weakness, seizures and headaches.  Psychiatric/Behavioral: Negative for depression. The patient is not nervous/anxious and does not have insomnia.    BP 136/76 (BP Location: Right Arm, Patient Position: Sitting, Cuff Size: Large)   Pulse 68   Temp 97.5 F (36.4 C) (Temporal)   Resp 16   Ht 6\' 2"  (1.88 m)   Wt 216 lb (98 kg)   SpO2 97%   BMI 27.73 kg/m   Physical Exam  Constitutional: He is oriented to person, place, and time. He appears well-developed and well-nourished.  HENT:  Head: Normocephalic and atraumatic.  Right Ear: External ear normal.  Left Ear: External ear normal.  Mouth/Throat: Oropharynx is clear and moist.  Plaque and periodontal disease  Eyes: Conjunctivae are normal. Pupils are equal, round, and reactive to light.  Neck: Normal range of motion. Neck supple.  Cardiovascular: Normal rate, regular rhythm and normal heart sounds.   Pulmonary/Chest: Effort normal. No respiratory distress.  Scattered rhonchi  Abdominal: Soft. Bowel sounds are normal.  Musculoskeletal: Normal range of motion. He exhibits no edema.  Lymphadenopathy:    He has no cervical adenopathy.  Neurological: He is alert and oriented to person, place, and time.  Gait normal  Skin: Skin is warm and dry.  Psychiatric: He has a normal mood and affect. Thought content normal.  Poor eye contact  Nursing note and vitals reviewed.  ASSESSMENT/PLAN:  HOSPITAL DISCHARGE FOLLOW UP 1. History of MI (myocardial infarction) UNDER CARE CARDIOLOGY.  NEEDS ADDITIONAL TESTING.  ASYMPTOMATIC  2. CAD in native artery - CBC - COMPLETE METABOLIC PANEL WITH GFR -  Lipid panel  3. Pre-diabetes DISCUSSED DIET - Hemoglobin A1c  4. History of substance abuse QUIT - VITAMIN D 25 Hydroxy (Vit-D Deficiency, Fractures)  5. Lower urinary tract symptoms (LUTS) DISCUSSED PSA TESTING.  HE DESIRES PSA - Urinalysis, Routine w reflex microscopic - PSA  6. Need for hepatitis C screening test - Hepatitis C antibody Greater than 50% of this visit was spent in counseling and coordinating care.  Total face to face time:  60 minutes .  Reviewed the hospital tests and results.  The diagnoses and recommendations.  Medicines reconciled. Health maintenance schedule.  Patient Instructions  You are due for some health testing that we will do over the next year as you recover You need  shots for tetanus, pneumonia, and in the fall a flu shot You can get shingles shot at the pharmacy  STOP the pantoprazole.  This is for heartburn  Need blood work in three months See me after blood testing   Raylene Everts, MD

## 2016-07-11 ENCOUNTER — Other Ambulatory Visit: Payer: Self-pay

## 2016-07-11 NOTE — Patient Outreach (Signed)
[place] [facility]) Care Management  07/11/2016  Albert Morris [birth year] [id number]     EMMI-STROKE RED ON EMMI ALERT Day # 13 Date: 07/10/16 Red Alert Reason: "Feeling worse overall? Yes"      Outreach attempt # 1 to patient. No answer at present. RN CM left HIPAA compliant voicemail message along with contact info.     Plan: RN CM will make outreach attempt to patient within three business days.    Enzo Montgomery, RN,BSN,CCM [place] Management Telephonic Care Management Coordinator Direct Phone: [phone] Toll Free: [phone] Fax: [phone]

## 2016-07-15 ENCOUNTER — Other Ambulatory Visit: Payer: Self-pay

## 2016-07-15 NOTE — Patient Outreach (Signed)
[place] [facility]) Care Management  07/15/2016  Albert Morris October 02, 1950 [id number]   EMMI-STROKE RED ON EMMI ALERT Day # 13 Date: 07/10/16 Red Alert Reason: "Feeling worse overall? Yes"    Outreach attempt #2 to patient. Spoke with patient. Reviewed and addressed red alert. Patient states that machine often times misunderstood and recorded his responses incorrectly. He voices that he is not feeling worse. States he has been doing well since discharge home. He reports that he has some tiredness in the mornings when first waking up. Patient states he read up on his new med and noted that this was a normal side effect and is hopeful that this will improve once his body gets used to med. He has completed MD f/u appt. Denies any issues with transportation and/ or meds. Patient has completed EMMI-Stroke post discharge automated calls. He voices no further RN CM needs or concerns at this time.    Plan: RN CM will notify [facility] administrative assistant of case status.   Enzo Montgomery, RN,BSN,CCM [place] Management Telephonic Care Management Coordinator Direct Phone: [phone] Toll Free: [phone] Fax: [phone]

## 2016-07-21 ENCOUNTER — Encounter: Payer: Self-pay | Admitting: Vascular Surgery

## 2016-07-23 ENCOUNTER — Ambulatory Visit (HOSPITAL_COMMUNITY)
Admission: RE | Admit: 2016-07-23 | Discharge: 2016-07-23 | Disposition: A | Discharge status: Home / Self Care | Payer: Medicare HMO | Source: Ambulatory Visit | Attending: Vascular Surgery | Admitting: Vascular Surgery

## 2016-07-23 ENCOUNTER — Encounter: Payer: Self-pay | Admitting: Vascular Surgery

## 2016-07-23 ENCOUNTER — Other Ambulatory Visit: Payer: Self-pay | Admitting: Vascular Surgery

## 2016-07-23 ENCOUNTER — Telehealth: Payer: Self-pay | Admitting: Vascular Surgery

## 2016-07-23 ENCOUNTER — Ambulatory Visit (INDEPENDENT_AMBULATORY_CARE_PROVIDER_SITE_OTHER): Payer: Medicare HMO | Admitting: Vascular Surgery

## 2016-07-23 VITALS — BP 158/87 | HR 69 | Temp 97.8°F | Resp 20 | Ht 74.0 in | Wt 215.5 lb

## 2016-07-23 DIAGNOSIS — I6522 Occlusion and stenosis of left carotid artery: Secondary | ICD-10-CM

## 2016-07-23 DIAGNOSIS — I6523 Occlusion and stenosis of bilateral carotid arteries: Secondary | ICD-10-CM | POA: Insufficient documentation

## 2016-07-23 NOTE — Telephone Encounter (Signed)
Faxed form for cardiac clearance to [facility] [phone] for patient seen today by Dr.Dickson. He is tentatively scheduled for a L CEA on 08/01/16. awt

## 2016-07-23 NOTE — Progress Notes (Signed)
Patient name: Albert Morris MRN: [id number] DOB: August 31, 1950 Sex: male   REASON FOR CONSULT:    Left carotid stenosis. The consult is requested by Albert Ransom, PA  HPI:   Albert Morris is a 66 y.o. male, who was referred for evaluation of a left carotid stenosis. He presented to [facility] 06/24/16 for dizziness and was found to have right cerebellar ischemia and posterior circulation disease. He has was noted to be hypertensive on admission. CTA of the neck showed 70-80% left proximal ICA stenosis -it was not felt to be causing his symptoms but needs to be addressed by vascular surgery as an outpatient.   The patient is right-handed. He developed sudden dizziness prior to his admission which resolved and then returned. He was therefore admitted and underwent extensive workup. Prior to this event he was not on any medication. He was started on aspirin, Plavix, and a statin. He denies any focal weakness or paresthesias. He denies expressive or receptive aphasia or amaurosis fugax.  His risk factors for peripheral vascular disease include hypertension, hypercholesterolemia, and a remote history of tobacco use.  He tells that he had a silent myocardial infarction in the remote past. He denies any recent chest pain or chest pressure.  Past Medical History:  Diagnosis Date  . CVA (cerebral vascular accident) ([place]) 06/21/2016  . Hyperlipidemia   . Hypertension   . Myocardial infarction ([place])   . Substance abuse     Family History  Problem Relation Age of Onset  . Breast cancer Mother        She is 75 years old, in [place]  . CVA Mother   . CAD Mother   . Arthritis Mother   . Cancer Mother   . Hypertension Mother   . Other Father        gunshot wound  . Early death Father        GSW  . Cancer Brother   . Cancer Brother   . Stroke Maternal Aunt   . Alcohol abuse Maternal Uncle   . Stroke Maternal Grandfather     SOCIAL HISTORY: Social History   Social History  .  Marital status: Divorced    Spouse name: N/A  . Number of children: 2  . Years of education: 12   Occupational History  . disabled     alcoholic   Social History Main Topics  . Smoking status: Former Smoker    Packs/day: 0.50    Years: 29.00    Quit date: 2006  . Smokeless tobacco: Never Used  . Alcohol use No     Comment: quit 2006  . Drug use: No     Comment: remote h/o heavy marijuana use, also used cocaine and others  . Sexual activity: Not Currently   Other Topics Concern  . Not on file   Social History Narrative   Divorced   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army for 8 years, EMT certified      Lives alone   Likes to walk      2 sons -in [place]       No Known Allergies  Current Outpatient Prescriptions  Medication Sig Dispense Refill  . aspirin 325 MG tablet Take 1 tablet (325 mg total) by mouth daily. 30 tablet 3  . atorvastatin (LIPITOR) 40 MG tablet Take 1 tablet (40 mg total) by mouth daily at 6 PM. 30 tablet 3  . carvedilol (COREG) 3.125 MG tablet Take 1 tablet (3.125  mg total) by mouth 2 (two) times daily with a meal. 60 tablet 1  . clopidogrel (PLAVIX) 75 MG tablet Take 1 tablet (75 mg total) by mouth daily. 30 tablet 3  . lisinopril (PRINIVIL,ZESTRIL) 5 MG tablet Take 1 tablet (5 mg total) by mouth daily. 30 tablet 1   No current facility-administered medications for this visit.     REVIEW OF SYSTEMS:  [X]  denotes positive finding, [ ]  denotes negative finding Cardiac  Comments:  Chest pain or chest pressure:    Shortness of breath upon exertion:    Short of breath when lying flat:    Irregular heart rhythm:        Vascular    Pain in calf, thigh, or hip brought on by ambulation:    Pain in feet at night that wakes you up from your sleep:     Blood clot in your veins:    Leg swelling:         Pulmonary    Oxygen at home:    Productive cough:     Wheezing:         Neurologic    Sudden weakness in arms or legs:     Sudden numbness in arms or  legs:     Sudden onset of difficulty speaking or slurred speech:    Temporary loss of vision in one eye:     Problems with dizziness:  X       Gastrointestinal    Blood in stool:     Vomited blood:         Genitourinary    Burning when urinating:     Blood in urine:        Psychiatric    Major depression:         Hematologic    Bleeding problems:    Problems with blood clotting too easily:        Skin    Rashes or ulcers:        Constitutional    Fever or chills:     PHYSICAL EXAM:   Vitals:   07/23/16 1027 07/23/16 1028  BP: (!) 161/91 (!) 158/87  Pulse: 69   Resp: 20   Temp: 97.8 F (36.6 C)   TempSrc: Oral   SpO2: 96%   Weight: 215 lb 8 oz (97.8 kg)   Height: 6\' 2"  (1.88 m)     GENERAL: The patient is a well-nourished male, in no acute distress. The vital signs are documented above. CARDIAC: There is a regular rate and rhythm.  VASCULAR: He does have a left carotid bruit. On the right side he has a palpable femoral and popliteal pulse. I cannot palpate pedal pulses. On the left side, he has a palpable femoral, popliteal, and posterior tibial pulse. He has no significant lower extremity swelling.  PULMONARY: There is good air exchange bilaterally without wheezing or rales. ABDOMEN: Soft and non-tender with normal pitched bowel sounds.  MUSCULOSKELETAL: There are no major deformities or cyanosis. NEUROLOGIC: No focal weakness or paresthesias are detected. SKIN: There are no ulcers or rashes noted. PSYCHIATRIC: The patient has a normal affect.  DATA:    CAROTID DUPLEX: I haven't been interpreted his carotid duplex scan today.  On the right side, he has a less than 39% stenosis.  On the left side, he has a greater than 80% stenosis.  CT ANGIOGRAM NECK: This showed a 70-80% proximal left internal carotid artery stenosis. I have reviewed these images, there is a  calcific plaque in both carotid arteries. These plaques are heavily calcified  MRI BRAIN: I  have reviewed the MRI of the brain that was done on 06/24/2016. This showed multifocal acute ischemic infarcts in the right cerebellar hemisphere within the right posterior inferior cerebellar artery distribution. The patient was also noted to have extensive multifocal intracranial atherosclerotic disease with moderate stenosis of multiple distal middle cerebral artery branches. There was also spinal canal stenosis at C3-C4.  ECHO: This patient had a neck on 06/26/2016. This showed that the left ventricular cavity size was normal. The patient had moderate left ventricular hypertrophy. Systolic function was mildly reduced. Estimated ejection fraction was 45-50%. 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 The patient had grade 1 diastolic dysfunction. There was mild regurgitation of the aortic valve. The aortic root measured 4.5 cm.  MYOCARDIAL PERFUSION STUDY: This study is pending.  MEDICAL ISSUES:   BILATERAL CAROTID STENOSES:   By CT angiogram, this patient had bilateral carotid plaques. The stenosis on the left was estimated at 70-80%. However, because of the calcific disease It is sometimes difficult to accurately determine the severity of the stenosis by CT scan. For this reason I obtained a carotid duplex scan in the office today. This shows a greater than 80% left carotid stenosis. I have recommended left carotid endarterectomy in order to lower his risk of future stroke. I have also reviewed the potential complications of surgery, including but not limited to: bleeding, stroke (perioperative risk 1-2%), MI, nerve injury of other unpredictable medical problems. All of the patients questions were answered and they are agreeable to proceed with surgery.  Given his abnormal ECHO and his pending myocardial perfusion study, we will obtain preoperative cardiac clearance before scheduling his surgery. He will stay on his aspirin and Plavix and statin until then. I  would then like to hold his Plavix for 5 days prior to his surgery.  Deitra Mayo [facility] [phone]

## 2016-07-24 ENCOUNTER — Telehealth: Payer: Self-pay | Admitting: Physician Assistant

## 2016-07-24 ENCOUNTER — Telehealth: Payer: Self-pay | Admitting: *Deleted

## 2016-07-24 NOTE — Telephone Encounter (Signed)
Spoke with Hailey at [facility] who states that Kerin Ransom PA-C is out of the office until 08/04/16 and will not be able to complete the pt's surgical clearance form. Nurse called and notified pt of appt on Mon. 07/28/16 @ 2:00 pm with Dr. Dwana Curd. Pt voiced understanding and states that his mother is not doing well at this time. Pt does not know if he will still have surgery done. Notified pt. That he would receive a phone call on Friday to remind him of his appt.

## 2016-07-24 NOTE — Telephone Encounter (Signed)
Informed my our nursing staff regarding the need for preoperative clearance. Patient was seen by Kerin Ransom, PA-C, he was stable at the time with recommendation of no further cardiac workup given no ischemia. I called the patient myself, he says there have been no changes. He denies any recent chest pain and SOB, he has been working in the yard without any exertional issue. I have discussed with Dr. Bronson Ing, given mildly low EF with possible prior MI, he is an intermediate risk patient for carotid endarterectomy. Otherwise he is cleared from cardiac perspective without further workup.  Hilbert Corrigan PA Pager: [phone]

## 2016-07-24 NOTE — Telephone Encounter (Signed)
Surgical Clearance faxed to [phone]

## 2016-07-25 ENCOUNTER — Encounter: Payer: Self-pay | Admitting: Cardiology

## 2016-07-25 ENCOUNTER — Other Ambulatory Visit: Payer: Self-pay

## 2016-07-28 ENCOUNTER — Ambulatory Visit: Payer: Medicare HMO | Admitting: Cardiovascular Disease

## 2016-07-30 ENCOUNTER — Encounter (HOSPITAL_COMMUNITY): Payer: Self-pay

## 2016-07-30 ENCOUNTER — Encounter (HOSPITAL_COMMUNITY)
Admission: RE | Admit: 2016-07-30 | Discharge: 2016-07-30 | Disposition: A | Discharge status: Home / Self Care | Payer: Medicare HMO | Source: Ambulatory Visit | Attending: Vascular Surgery | Admitting: Vascular Surgery

## 2016-07-30 DIAGNOSIS — Z8249 Family history of ischemic heart disease and other diseases of the circulatory system: Secondary | ICD-10-CM | POA: Diagnosis not present

## 2016-07-30 DIAGNOSIS — I429 Cardiomyopathy, unspecified: Secondary | ICD-10-CM | POA: Diagnosis not present

## 2016-07-30 DIAGNOSIS — E785 Hyperlipidemia, unspecified: Secondary | ICD-10-CM | POA: Diagnosis present

## 2016-07-30 DIAGNOSIS — Z79899 Other long term (current) drug therapy: Secondary | ICD-10-CM | POA: Diagnosis not present

## 2016-07-30 DIAGNOSIS — I10 Essential (primary) hypertension: Secondary | ICD-10-CM | POA: Diagnosis present

## 2016-07-30 DIAGNOSIS — Z7982 Long term (current) use of aspirin: Secondary | ICD-10-CM | POA: Diagnosis not present

## 2016-07-30 DIAGNOSIS — Z8261 Family history of arthritis: Secondary | ICD-10-CM | POA: Diagnosis not present

## 2016-07-30 DIAGNOSIS — Z803 Family history of malignant neoplasm of breast: Secondary | ICD-10-CM | POA: Diagnosis not present

## 2016-07-30 DIAGNOSIS — I6523 Occlusion and stenosis of bilateral carotid arteries: Secondary | ICD-10-CM | POA: Diagnosis present

## 2016-07-30 DIAGNOSIS — Z8673 Personal history of transient ischemic attack (TIA), and cerebral infarction without residual deficits: Secondary | ICD-10-CM | POA: Diagnosis not present

## 2016-07-30 DIAGNOSIS — Z823 Family history of stroke: Secondary | ICD-10-CM | POA: Diagnosis not present

## 2016-07-30 DIAGNOSIS — I2510 Atherosclerotic heart disease of native coronary artery without angina pectoris: Secondary | ICD-10-CM | POA: Diagnosis not present

## 2016-07-30 DIAGNOSIS — Z7902 Long term (current) use of antithrombotics/antiplatelets: Secondary | ICD-10-CM | POA: Diagnosis not present

## 2016-07-30 DIAGNOSIS — I6522 Occlusion and stenosis of left carotid artery: Secondary | ICD-10-CM | POA: Diagnosis not present

## 2016-07-30 DIAGNOSIS — I252 Old myocardial infarction: Secondary | ICD-10-CM | POA: Diagnosis not present

## 2016-07-30 DIAGNOSIS — Z87891 Personal history of nicotine dependence: Secondary | ICD-10-CM | POA: Diagnosis not present

## 2016-07-30 LAB — COMPREHENSIVE METABOLIC PANEL
ALK PHOS: 66 U/L (ref 38–126)
ALT: 21 U/L (ref 17–63)
ANION GAP: 6 (ref 5–15)
AST: 25 U/L (ref 15–41)
Albumin: 4.2 g/dL (ref 3.5–5.0)
BILIRUBIN TOTAL: 0.5 mg/dL (ref 0.3–1.2)
BUN: 18 mg/dL (ref 6–20)
CALCIUM: 9.5 mg/dL (ref 8.9–10.3)
CO2: 27 mmol/L (ref 22–32)
Chloride: 104 mmol/L (ref 101–111)
Creatinine, Ser: 1.2 mg/dL (ref 0.61–1.24)
GLUCOSE: 108 mg/dL — AB (ref 65–99)
POTASSIUM: 4.1 mmol/L (ref 3.5–5.1)
Sodium: 137 mmol/L (ref 135–145)
TOTAL PROTEIN: 7.6 g/dL (ref 6.5–8.1)

## 2016-07-30 LAB — CBC
HEMATOCRIT: 39.4 % (ref 39.0–52.0)
HEMOGLOBIN: 12.9 g/dL — AB (ref 13.0–17.0)
MCH: 28.7 pg (ref 26.0–34.0)
MCHC: 32.7 g/dL (ref 30.0–36.0)
MCV: 87.6 fL (ref 78.0–100.0)
Platelets: 278 10*3/uL (ref 150–400)
RBC: 4.5 MIL/uL (ref 4.22–5.81)
RDW: 13.2 % (ref 11.5–15.5)
WBC: 7.7 10*3/uL (ref 4.0–10.5)

## 2016-07-30 LAB — URINALYSIS, ROUTINE W REFLEX MICROSCOPIC
Bilirubin Urine: NEGATIVE
GLUCOSE, UA: NEGATIVE mg/dL
Hgb urine dipstick: NEGATIVE
Ketones, ur: NEGATIVE mg/dL
Leukocytes, UA: NEGATIVE
NITRITE: NEGATIVE
PH: 5 (ref 5.0–8.0)
PROTEIN: NEGATIVE mg/dL
Specific Gravity, Urine: 1.021 (ref 1.005–1.030)

## 2016-07-30 LAB — NO BLOOD PRODUCTS

## 2016-07-30 LAB — PROTIME-INR
INR: 1.04
PROTHROMBIN TIME: 13.6 s (ref 11.4–15.2)

## 2016-07-30 LAB — SURGICAL PCR SCREEN
MRSA, PCR: NEGATIVE
Staphylococcus aureus: NEGATIVE

## 2016-07-30 LAB — APTT: APTT: 35 s (ref 24–36)

## 2016-07-30 NOTE — Pre-Procedure Instructions (Addendum)
KEDRICK MCNAMEE  07/30/2016      [facility] [place], [place] - [place] [address] [place] [address] Phone: [phone] Fax: [phone]    Your procedure is scheduled on Fri. June 15  Report to [facility] Admitting at 5:30 A.M.  Call this number if you have problems the morning of surgery:  [phone]   Remember:  Do not eat food or drink liquids after midnight.  Take these medicines the morning of surgery with A SIP OF WATER : carvedilol (coreg), aspirin,             Stop: aleve, ibuprofen, motrin, BC Powders,Goody's, vitamins/herbal medicines   Do not wear jewelry.  Do not wear lotions, powders, or perfumes, or deoderant.  Do not shave 48 hours prior to surgery.  Men may shave face and neck.  Do not bring valuables to the hospital.  [facility] is not responsible for any belongings or valuables.  Contacts, dentures or bridgework may not be worn into surgery.  Leave your suitcase in the car.  After surgery it may be brought to your room.  For patients admitted to the hospital, discharge time will be determined by your treatment team.  Patients discharged the day of surgery will not be allowed to drive home.    Special instructions:   [facility]- Preparing For Surgery  Before surgery, you can play an important role. Because skin is not sterile, your skin needs to be as free of germs as possible. You can reduce the number of germs on your skin by washing with CHG (chlorahexidine gluconate) Soap before surgery.  CHG is an antiseptic cleaner which kills germs and bonds with the skin to continue killing germs even after washing.  Please do not use if you have an allergy to CHG or antibacterial soaps. If your skin becomes reddened/irritated stop using the CHG.  Do not shave (including legs and underarms) for at least 48 hours prior to first CHG shower. It is OK to shave your face.  Please follow these instructions  carefully.   1. Shower the NIGHT BEFORE SURGERY and the MORNING OF SURGERY with CHG.   2. If you chose to wash your hair, wash your hair first as usual with your normal shampoo.  3. After you shampoo, rinse your hair and body thoroughly to remove the shampoo.  4. Use CHG as you would any other liquid soap. You can apply CHG directly to the skin and wash gently with a scrungie or a clean washcloth.   5. Apply the CHG Soap to your body ONLY FROM THE NECK DOWN.  Do not use on open wounds or open sores. Avoid contact with your eyes, ears, mouth and genitals (private parts). Wash genitals (private parts) with your normal soap.  6. Wash thoroughly, paying special attention to the area where your surgery will be performed.  7. Thoroughly rinse your body with warm water from the neck down.  8. DO NOT shower/wash with your normal soap after using and rinsing off the CHG Soap.  9. Pat yourself dry with a CLEAN TOWEL.   10. Wear CLEAN PAJAMAS   11. Place CLEAN SHEETS on your bed the night of your first shower and DO NOT SLEEP WITH PETS.    Day of Surgery: Do not apply any deodorants/lotions. Please wear clean clothes to the hospital/surgery center.      Please read over the following fact sheets that you were  given. Coughing and Deep Breathing, MRSA Information and Surgical Site Infection Prevention

## 2016-07-30 NOTE — Progress Notes (Addendum)
PCP:Dr. Lysle Morales  Cardiologist: Kerin Ransom, PA  Pt. Stopped plavix 07/26/16. Instructed to continue aspirin.  Pt. Refusing to have any blood products except albumin. It's not  Religous, but personal preference. Gave information to [employer] at Dr. Nicole Cella office and she will inform him.

## 2016-07-31 NOTE — Progress Notes (Signed)
Anesthesia Chart Review: Patient is a 66 year old male scheduled for left carotid endarterectomy on 08/01/16 by Dr. Scot Dock.  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   PCP is Dr. Raylene Everts. Cardiologist is with [facility] (newly seen). He was seen by Kerin Ransom, PA-C on 07/07/16 for hospital follow-up. It was suspected that patient has ischemic cardiomyopathy based on inferior scar and decreased EF on recent stress test. No further cardiac testing recommended unless patient develops angina. On 07/24/16, Fannie Knee, PA wrote, "I have discussed with Dr. Bronson Ing, given mildly low EF with possible prior MI, he is an intermediate risk patient for carotid endarterectomy. Otherwise he is cleared from cardiac perspective without further workup."  Meds include ASA 325 mg, Plavix (held starting 07/26/16), Lipitor, Coreg, lisinopril.    BP (!) 172/84   Pulse 69   Temp 36.8 C   Resp 20   Ht 6\' 2"  (1.88 m)   Wt 214 lb 8 oz (97.3 kg)   SpO2 98%   BMI 27.54 kg/m   EKG 06/24/16: NSR.  Echo 06/26/16: Study Conclusions - Left ventricle: The cavity size was normal. Wall thickness was increased in a pattern of moderate LVH. Systolic function was mildly reduced. The estimated ejection fraction was in the range of 45% to 50%. There is akinesis and aneurysmal deformity of the basalinferoseptal myocardium. Doppler parameters are consistent with abnormal left ventricular relaxation (grade 1 diastolic dysfunction). - Aortic valve: There was mild regurgitation directed towards the mitral anterior leaflet. - Aorta: Aortic root dimension: 45 mm (ED). Impressions: - No cardiac source of emboli was indentified.  Nuclear stress test 07/03/16:  No diagnostic ST segment changes over baseline abnormalities. Rare  PVC.  Medium sized, moderate intensity, inferior defect from apex to base that is fixed, more prominent on rest imaging than stress. Most consistent with scar, although there could be a component of soft tissue attenuation. There are no large ischemic territories noted.  This is an intermediate risk study.  Nuclear stress EF: 48%.  Carotid U/S 07/23/16: Impression: 1. Doppler velocities suggest 1-39% right proximal ICA stenosis. Doppler velocities suggest 80-99% left proximal ICA stenosis.  CTA neck/head 06/25/16: CTA neck: 1. 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 2. Otherwise no large vessel occlusion, aneurysm, or significant stenosis of the carotid and vertebral arteries of the neck is identified. CTA head: 1. Severe stenosis/near occlusion of the right V3/V4 junction with severe atherosclerotic disease of the right V4 segment. No definite right PICA identified, either hypoplastic or occluded. 2. Severe mid basilar stenosis. 3. Calcified plaque of bilateral internal carotid arteries with moderate to severe right and moderate left paraclinoid stenosis. 4. 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 5. No large vessel occlusion or aneurysm of the circle of Willis identified.  CXR 06/24/16: IMPRESSION: No active cardiopulmonary disease.  Stable appearance from priors.  Preoperative labs noted. Cr 1.20. Glucose 108. H/H 12.9/39.4. While at PAT, he refused blood products except albumin (for personal, not religious preference). PAT RN notified [facility] RN Arbie Cookey. A1c 6.4 on 06/25/16.   If no acute changes then I would anticipate that he can proceed as planned.  [facility] Short Stay Center/Anesthesiology Phone [phone] 07/31/2016 10:03 AM

## 2016-08-01 ENCOUNTER — Telehealth: Payer: Self-pay | Admitting: Vascular Surgery

## 2016-08-01 ENCOUNTER — Encounter (HOSPITAL_COMMUNITY): Payer: Self-pay | Admitting: *Deleted

## 2016-08-01 ENCOUNTER — Inpatient Hospital Stay (HOSPITAL_COMMUNITY): Payer: Medicare HMO | Admitting: Certified Registered"

## 2016-08-01 ENCOUNTER — Encounter (HOSPITAL_COMMUNITY)
Admission: RE | Disposition: A | Discharge status: Home / Self Care | Payer: Self-pay | Source: Ambulatory Visit | Attending: Vascular Surgery

## 2016-08-01 ENCOUNTER — Inpatient Hospital Stay (HOSPITAL_COMMUNITY): Payer: Medicare HMO | Admitting: Vascular Surgery

## 2016-08-01 ENCOUNTER — Inpatient Hospital Stay (HOSPITAL_COMMUNITY)
Admission: RE | Admit: 2016-08-01 | Discharge: 2016-08-03 | DRG: 039 | Disposition: A | Discharge status: Home / Self Care | Payer: Medicare HMO | Source: Ambulatory Visit | Attending: Vascular Surgery | Admitting: Vascular Surgery

## 2016-08-01 DIAGNOSIS — Z8673 Personal history of transient ischemic attack (TIA), and cerebral infarction without residual deficits: Secondary | ICD-10-CM | POA: Diagnosis not present

## 2016-08-01 DIAGNOSIS — Z8249 Family history of ischemic heart disease and other diseases of the circulatory system: Secondary | ICD-10-CM

## 2016-08-01 DIAGNOSIS — I252 Old myocardial infarction: Secondary | ICD-10-CM

## 2016-08-01 DIAGNOSIS — Z803 Family history of malignant neoplasm of breast: Secondary | ICD-10-CM

## 2016-08-01 DIAGNOSIS — Z87891 Personal history of nicotine dependence: Secondary | ICD-10-CM

## 2016-08-01 DIAGNOSIS — I6522 Occlusion and stenosis of left carotid artery: Secondary | ICD-10-CM | POA: Diagnosis not present

## 2016-08-01 DIAGNOSIS — Z7982 Long term (current) use of aspirin: Secondary | ICD-10-CM | POA: Diagnosis not present

## 2016-08-01 DIAGNOSIS — Z79899 Other long term (current) drug therapy: Secondary | ICD-10-CM | POA: Diagnosis not present

## 2016-08-01 DIAGNOSIS — Z823 Family history of stroke: Secondary | ICD-10-CM | POA: Diagnosis not present

## 2016-08-01 DIAGNOSIS — Z8261 Family history of arthritis: Secondary | ICD-10-CM | POA: Diagnosis not present

## 2016-08-01 DIAGNOSIS — I6523 Occlusion and stenosis of bilateral carotid arteries: Secondary | ICD-10-CM | POA: Diagnosis present

## 2016-08-01 DIAGNOSIS — I10 Essential (primary) hypertension: Secondary | ICD-10-CM | POA: Diagnosis present

## 2016-08-01 DIAGNOSIS — E785 Hyperlipidemia, unspecified: Secondary | ICD-10-CM | POA: Diagnosis present

## 2016-08-01 DIAGNOSIS — Z7902 Long term (current) use of antithrombotics/antiplatelets: Secondary | ICD-10-CM

## 2016-08-01 HISTORY — PX: ENDARTERECTOMY: SHX5162

## 2016-08-01 HISTORY — PX: PATCH ANGIOPLASTY: SHX6230

## 2016-08-01 SURGERY — ENDARTERECTOMY, CAROTID
Anesthesia: General | Site: Neck | Laterality: Left

## 2016-08-01 MED ORDER — HEPARIN SODIUM (PORCINE) 1000 UNIT/ML IJ SOLN
INTRAMUSCULAR | Status: DC | PRN
  Administered 2016-08-01: 9000 [IU] via INTRAVENOUS

## 2016-08-01 MED ORDER — LACTATED RINGERS IV SOLN
INTRAVENOUS | Status: DC | PRN
  Administered 2016-08-01 (×2): via INTRAVENOUS

## 2016-08-01 MED ORDER — DEXTRAN 40 IN SALINE 10-0.9 % IV SOLN
INTRAVENOUS | Status: AC
  Filled 2016-08-01: qty 500

## 2016-08-01 MED ORDER — ONDANSETRON HCL 4 MG/2ML IJ SOLN
INTRAMUSCULAR | Status: AC
  Filled 2016-08-01: qty 2

## 2016-08-01 MED ORDER — ALUM & MAG HYDROXIDE-SIMETH 200-200-20 MG/5ML PO SUSP: 15.0000 mL | ORAL | Status: DC | PRN

## 2016-08-01 MED ORDER — MAGNESIUM SULFATE 2 GM/50ML IV SOLN
2.0000 g | Freq: Every day | INTRAVENOUS | Status: DC | PRN
  Filled 2016-08-01: qty 50

## 2016-08-01 MED ORDER — ROCURONIUM BROMIDE 100 MG/10ML IV SOLN
INTRAVENOUS | Status: DC | PRN
  Administered 2016-08-01: 20 mg via INTRAVENOUS
  Administered 2016-08-01: 50 mg via INTRAVENOUS

## 2016-08-01 MED ORDER — LABETALOL HCL 5 MG/ML IV SOLN: 10.0000 mg | INTRAVENOUS | Status: DC | PRN

## 2016-08-01 MED ORDER — CLOPIDOGREL BISULFATE 75 MG PO TABS
75.0000 mg | ORAL_TABLET | Freq: Every day | ORAL | Status: DC
  Administered 2016-08-02 – 2016-08-03 (×2): 75 mg via ORAL
  Filled 2016-08-01 (×2): qty 1

## 2016-08-01 MED ORDER — POTASSIUM CHLORIDE CRYS ER 20 MEQ PO TBCR: 20.0000 meq | EXTENDED_RELEASE_TABLET | Freq: Every day | ORAL | Status: DC | PRN

## 2016-08-01 MED ORDER — ONDANSETRON HCL 4 MG/2ML IJ SOLN
INTRAMUSCULAR | Status: DC | PRN
  Administered 2016-08-01: 4 mg via INTRAVENOUS

## 2016-08-01 MED ORDER — PHENOL 1.4 % MT LIQD: 1.0000 | OROMUCOSAL | Status: DC | PRN

## 2016-08-01 MED ORDER — PROPOFOL 10 MG/ML IV BOLUS
INTRAVENOUS | Status: DC | PRN
  Administered 2016-08-01: 50 mg via INTRAVENOUS
  Administered 2016-08-01: 150 mg via INTRAVENOUS

## 2016-08-01 MED ORDER — PHENYLEPHRINE HCL 10 MG/ML IJ SOLN
INTRAVENOUS | Status: DC | PRN
  Administered 2016-08-01: 30 ug/min via INTRAVENOUS

## 2016-08-01 MED ORDER — BISACODYL 10 MG RE SUPP: 10.0000 mg | Freq: Every day | RECTAL | Status: DC | PRN

## 2016-08-01 MED ORDER — 0.9 % SODIUM CHLORIDE (POUR BTL) OPTIME
TOPICAL | Status: DC | PRN
  Administered 2016-08-01: 2000 mL

## 2016-08-01 MED ORDER — SUGAMMADEX SODIUM 200 MG/2ML IV SOLN
INTRAVENOUS | Status: DC | PRN
  Administered 2016-08-01: 200 mg via INTRAVENOUS

## 2016-08-01 MED ORDER — DOCUSATE SODIUM 100 MG PO CAPS
100.0000 mg | ORAL_CAPSULE | Freq: Every day | ORAL | Status: DC
  Administered 2016-08-02 – 2016-08-03 (×2): 100 mg via ORAL
  Filled 2016-08-01 (×2): qty 1

## 2016-08-01 MED ORDER — ACETAMINOPHEN 325 MG RE SUPP
325.0000 mg | RECTAL | Status: DC | PRN
  Filled 2016-08-01: qty 2

## 2016-08-01 MED ORDER — LISINOPRIL 5 MG PO TABS: 5.0000 mg | ORAL_TABLET | Freq: Every day | ORAL | Status: DC

## 2016-08-01 MED ORDER — SODIUM CHLORIDE 0.9 % IV SOLN
500.0000 mL | Freq: Once | INTRAVENOUS | Status: AC | PRN
  Administered 2016-08-01 (×2): 500 mL via INTRAVENOUS

## 2016-08-01 MED ORDER — ATORVASTATIN CALCIUM 40 MG PO TABS
40.0000 mg | ORAL_TABLET | Freq: Every day | ORAL | Status: DC
  Administered 2016-08-01 – 2016-08-02 (×2): 40 mg via ORAL
  Filled 2016-08-01: qty 1

## 2016-08-01 MED ORDER — SUCCINYLCHOLINE CHLORIDE 200 MG/10ML IV SOSY
PREFILLED_SYRINGE | INTRAVENOUS | Status: AC
  Filled 2016-08-01: qty 10

## 2016-08-01 MED ORDER — ASPIRIN 325 MG PO TABS
325.0000 mg | ORAL_TABLET | Freq: Every day | ORAL | Status: DC
  Administered 2016-08-02 – 2016-08-03 (×2): 325 mg via ORAL
  Filled 2016-08-01 (×2): qty 1

## 2016-08-01 MED ORDER — GLYCOPYRROLATE 0.2 MG/ML IJ SOLN
INTRAMUSCULAR | Status: DC | PRN
  Administered 2016-08-01: 0.2 mg via INTRAVENOUS

## 2016-08-01 MED ORDER — ONDANSETRON HCL 4 MG/2ML IJ SOLN: 4.0000 mg | Freq: Once | INTRAMUSCULAR | Status: DC | PRN

## 2016-08-01 MED ORDER — THROMBIN 20000 UNITS EX SOLR
CUTANEOUS | Status: AC
  Filled 2016-08-01: qty 20000

## 2016-08-01 MED ORDER — MORPHINE SULFATE (PF) 2 MG/ML IV SOLN: 2.0000 mg | INTRAVENOUS | Status: DC | PRN

## 2016-08-01 MED ORDER — METOPROLOL TARTRATE 5 MG/5ML IV SOLN: 2.0000 mg | INTRAVENOUS | Status: DC | PRN

## 2016-08-01 MED ORDER — PROPOFOL 10 MG/ML IV BOLUS
INTRAVENOUS | Status: AC
Start: 2016-08-01 — End: ?
  Filled 2016-08-01: qty 40

## 2016-08-01 MED ORDER — LIDOCAINE HCL (PF) 1 % IJ SOLN
INTRAMUSCULAR | Status: AC
  Filled 2016-08-01: qty 30

## 2016-08-01 MED ORDER — ACETAMINOPHEN 325 MG PO TABS: 325.0000 mg | ORAL_TABLET | ORAL | Status: DC | PRN

## 2016-08-01 MED ORDER — PANTOPRAZOLE SODIUM 40 MG PO TBEC
40.0000 mg | DELAYED_RELEASE_TABLET | Freq: Every day | ORAL | Status: DC
  Administered 2016-08-01 – 2016-08-03 (×3): 40 mg via ORAL
  Filled 2016-08-01 (×3): qty 1

## 2016-08-01 MED ORDER — LIDOCAINE-EPINEPHRINE (PF) 1 %-1:200000 IJ SOLN
INTRAMUSCULAR | Status: AC
  Filled 2016-08-01: qty 30

## 2016-08-01 MED ORDER — HYDROMORPHONE HCL 1 MG/ML IJ SOLN
0.2500 mg | INTRAMUSCULAR | Status: DC | PRN
  Administered 2016-08-01: 0.5 mg via INTRAVENOUS

## 2016-08-01 MED ORDER — DEXTRAN 40 IN SALINE 10-0.9 % IV SOLN
INTRAVENOUS | Status: AC | PRN
  Administered 2016-08-01: 500 mL

## 2016-08-01 MED ORDER — CARVEDILOL 3.125 MG PO TABS
3.1250 mg | ORAL_TABLET | Freq: Two times a day (BID) | ORAL | Status: DC
  Administered 2016-08-01: 3.125 mg via ORAL
  Filled 2016-08-01: qty 1

## 2016-08-01 MED ORDER — SODIUM CHLORIDE 0.9 % IV SOLN
INTRAVENOUS | Status: DC
  Administered 2016-08-01 – 2016-08-02 (×2): via INTRAVENOUS

## 2016-08-01 MED ORDER — HYDRALAZINE HCL 20 MG/ML IJ SOLN
INTRAMUSCULAR | Status: AC
  Administered 2016-08-01: 5 mg via INTRAVENOUS
  Filled 2016-08-01: qty 1

## 2016-08-01 MED ORDER — LIDOCAINE HCL (CARDIAC) 20 MG/ML IV SOLN
INTRAVENOUS | Status: DC | PRN
  Administered 2016-08-01: 100 mg via INTRAVENOUS

## 2016-08-01 MED ORDER — DOPAMINE-DEXTROSE 3.2-5 MG/ML-% IV SOLN
2.0000 ug/kg/min | INTRAVENOUS | Status: DC
  Filled 2016-08-01: qty 250

## 2016-08-01 MED ORDER — OXYCODONE-ACETAMINOPHEN 5-325 MG PO TABS: 1.0000 | ORAL_TABLET | ORAL | Status: DC | PRN

## 2016-08-01 MED ORDER — MEPERIDINE HCL 25 MG/ML IJ SOLN: 6.2500 mg | INTRAMUSCULAR | Status: DC | PRN

## 2016-08-01 MED ORDER — HYDROMORPHONE HCL 1 MG/ML IJ SOLN
INTRAMUSCULAR | Status: AC
  Filled 2016-08-01: qty 0.5

## 2016-08-01 MED ORDER — PROTAMINE SULFATE 10 MG/ML IV SOLN
INTRAVENOUS | Status: DC | PRN
  Administered 2016-08-01: 40 mg via INTRAVENOUS

## 2016-08-01 MED ORDER — ONDANSETRON HCL 4 MG/2ML IJ SOLN: 4.0000 mg | Freq: Four times a day (QID) | INTRAMUSCULAR | Status: DC | PRN

## 2016-08-01 MED ORDER — POLYETHYLENE GLYCOL 3350 17 G PO PACK: 17.0000 g | PACK | Freq: Every day | ORAL | Status: DC | PRN

## 2016-08-01 MED ORDER — CEFUROXIME SODIUM 1.5 G IV SOLR
1.5000 g | Freq: Two times a day (BID) | INTRAVENOUS | Status: AC
  Administered 2016-08-01 – 2016-08-02 (×2): 1.5 g via INTRAVENOUS
  Filled 2016-08-01 (×2): qty 1.5

## 2016-08-01 MED ORDER — GUAIFENESIN-DM 100-10 MG/5ML PO SYRP: 15.0000 mL | ORAL_SOLUTION | ORAL | Status: DC | PRN

## 2016-08-01 MED ORDER — LIDOCAINE-EPINEPHRINE (PF) 1 %-1:200000 IJ SOLN
INTRAMUSCULAR | Status: DC | PRN
  Administered 2016-08-01: 30 mL

## 2016-08-01 MED ORDER — HYDRALAZINE HCL 20 MG/ML IJ SOLN
5.0000 mg | INTRAMUSCULAR | Status: AC | PRN
  Administered 2016-08-01 (×2): 5 mg via INTRAVENOUS

## 2016-08-01 MED ORDER — FENTANYL CITRATE (PF) 250 MCG/5ML IJ SOLN
INTRAMUSCULAR | Status: AC
  Filled 2016-08-01: qty 5

## 2016-08-01 MED ORDER — OXYCODONE-ACETAMINOPHEN 5-325 MG PO TABS: 1.0000 | ORAL_TABLET | Freq: Four times a day (QID) | ORAL | 0 refills | Status: DC | PRN

## 2016-08-01 MED ORDER — EPHEDRINE SULFATE 50 MG/ML IJ SOLN
INTRAMUSCULAR | Status: DC | PRN
  Administered 2016-08-01: 10 mg via INTRAVENOUS
  Administered 2016-08-01: 5 mg via INTRAVENOUS
  Administered 2016-08-01: 10 mg via INTRAVENOUS

## 2016-08-01 MED ORDER — DEXTROSE 5 % IV SOLN
1.5000 g | INTRAVENOUS | Status: AC
  Administered 2016-08-01: 1.5 g via INTRAVENOUS
  Filled 2016-08-01: qty 1.5

## 2016-08-01 MED ORDER — LIDOCAINE 2% (20 MG/ML) 5 ML SYRINGE
INTRAMUSCULAR | Status: AC
  Filled 2016-08-01: qty 5

## 2016-08-01 MED ORDER — ROCURONIUM BROMIDE 10 MG/ML (PF) SYRINGE
PREFILLED_SYRINGE | INTRAVENOUS | Status: AC
  Filled 2016-08-01: qty 5

## 2016-08-01 MED ORDER — SODIUM CHLORIDE 0.9 % IV SOLN
INTRAVENOUS | Status: DC | PRN
  Administered 2016-08-01: 07:00:00

## 2016-08-01 MED ORDER — FENTANYL CITRATE (PF) 100 MCG/2ML IJ SOLN
INTRAMUSCULAR | Status: DC | PRN
  Administered 2016-08-01: 100 ug via INTRAVENOUS
  Administered 2016-08-01: 50 ug via INTRAVENOUS

## 2016-08-01 SURGICAL SUPPLY — 42 items
ADH SKN CLS APL DERMABOND .7 (GAUZE/BANDAGES/DRESSINGS) ×1
BAG DECANTER FOR FLEXI CONT (MISCELLANEOUS) ×2 IMPLANT
CANISTER SUCT 3000ML PPV (MISCELLANEOUS) ×2 IMPLANT
CANNULA VESSEL 3MM 2 BLNT TIP (CANNULA) ×6 IMPLANT
CATH ROBINSON RED A/P 18FR (CATHETERS) ×2 IMPLANT
CLIP TI MEDIUM 24 (CLIP) ×2 IMPLANT
CLIP TI WIDE RED SMALL 24 (CLIP) ×2 IMPLANT
CRADLE DONUT ADULT HEAD (MISCELLANEOUS) ×2 IMPLANT
DERMABOND ADVANCED (GAUZE/BANDAGES/DRESSINGS) ×1
DERMABOND ADVANCED .7 DNX12 (GAUZE/BANDAGES/DRESSINGS) ×1 IMPLANT
ELECT REM PT RETURN 9FT ADLT (ELECTROSURGICAL) ×2
ELECTRODE REM PT RTRN 9FT ADLT (ELECTROSURGICAL) ×1 IMPLANT
GLOVE BIO SURGEON STRL SZ 6.5 (GLOVE) ×4 IMPLANT
GLOVE BIO SURGEON STRL SZ7.5 (GLOVE) ×2 IMPLANT
GLOVE BIOGEL PI IND STRL 6.5 (GLOVE) IMPLANT
GLOVE BIOGEL PI IND STRL 8 (GLOVE) ×1 IMPLANT
GLOVE BIOGEL PI INDICATOR 6.5 (GLOVE) ×4
GLOVE BIOGEL PI INDICATOR 8 (GLOVE) ×1
GLOVE SURG SS PI 7.0 STRL IVOR (GLOVE) ×1 IMPLANT
GOWN STRL REUS W/ TWL LRG LVL3 (GOWN DISPOSABLE) ×3 IMPLANT
GOWN STRL REUS W/ TWL XL LVL3 (GOWN DISPOSABLE) IMPLANT
GOWN STRL REUS W/TWL LRG LVL3 (GOWN DISPOSABLE) ×8
GOWN STRL REUS W/TWL XL LVL3 (GOWN DISPOSABLE) ×2
KIT BASIN OR (CUSTOM PROCEDURE TRAY) ×2 IMPLANT
KIT ROOM TURNOVER OR (KITS) ×2 IMPLANT
KIT SHUNT ARGYLE CAROTID ART 6 (VASCULAR PRODUCTS) ×1 IMPLANT
NDL HYPO 25GX1X1/2 BEV (NEEDLE) ×1 IMPLANT
NEEDLE HYPO 25GX1X1/2 BEV (NEEDLE) ×2 IMPLANT
NS IRRIG 1000ML POUR BTL (IV SOLUTION) ×4 IMPLANT
PACK CAROTID (CUSTOM PROCEDURE TRAY) ×2 IMPLANT
PAD ARMBOARD 7.5X6 YLW CONV (MISCELLANEOUS) ×4 IMPLANT
PATCH VASC XENOSURE 1CMX6CM (Vascular Products) ×2 IMPLANT
PATCH VASC XENOSURE 1X6 (Vascular Products) IMPLANT
SUT PROLENE 6 0 BV (SUTURE) ×3 IMPLANT
SUT SILK 3 0 (SUTURE) ×2
SUT SILK 3-0 18XBRD TIE 12 (SUTURE) IMPLANT
SUT VIC AB 3-0 SH 27 (SUTURE) ×2
SUT VIC AB 3-0 SH 27X BRD (SUTURE) ×1 IMPLANT
SUT VICRYL 4-0 PS2 18IN ABS (SUTURE) ×2 IMPLANT
SYR 20CC LL (SYRINGE) ×2 IMPLANT
SYR CONTROL 10ML LL (SYRINGE) ×2 IMPLANT
WATER STERILE IRR 1000ML POUR (IV SOLUTION) ×2 IMPLANT

## 2016-08-01 NOTE — Anesthesia Postprocedure Evaluation (Signed)
Anesthesia Post Note  Patient: Albert Morris  Procedure(s) Performed: Procedure(s) (LRB): ENDARTERECTOMY LEFT CAROTID (Left) PATCH ANGIOPLASTY LEFT CAROTID ARTERY USING ZENOSURE BIOLOGIC PATCH (Left)     Anesthesia Post Evaluation  Last Vitals:  Vitals:   08/01/16 1500 08/01/16 1530  BP: (!) 97/56 110/60  Pulse: (!) 53 (!) 53  Resp: 15 15  Temp:      Last Pain:  Vitals:   08/01/16 1445  TempSrc:   PainSc: 0-No pain                 Dinia Joynt DAVID

## 2016-08-01 NOTE — Progress Notes (Signed)
Late Entry  Patient arrived from PACU at 1425 with BP 88/55, MAP 65, HR 57. Patient states he feels lightheaded and very sleepy; had just rec'd prn dilaudid prior to transfer. Patient had already rec'd both 500cc NS boluses in PACU so no additional bolus was available. Patient also noted to have a VERY slight left lip droop, but only while smiling/frowning with mouth closed. Dr. Scot Dock notified of both findings.   When Dr. Scot Dock arrived to unit the patient's BP had already increased to 97/56, MAP 69. New orders received for dopamine drip with parameters. Patient's BP was already maintaining above listed parameters so dopamine drip not started at this time. Will continue to monitor q1h.  Joellen Jersey, RN

## 2016-08-01 NOTE — Transfer of Care (Signed)
Immediate Anesthesia Transfer of Care Note  Patient: Albert Morris  Procedure(s) Performed: Procedure(s): ENDARTERECTOMY LEFT CAROTID (Left) PATCH ANGIOPLASTY LEFT CAROTID ARTERY USING ZENOSURE BIOLOGIC PATCH (Left)  Patient Location: PACU  Anesthesia Type:General  Level of Consciousness: awake, alert , oriented and patient cooperative  Airway & Oxygen Therapy: Patient Spontanous Breathing  Post-op Assessment: Report given to RN and Post -op Vital signs reviewed and stable  Post vital signs: Reviewed and stable  Last Vitals:  Vitals:   08/01/16 0553 08/01/16 0555  BP: (!) 211/110 (!) 214/106  Pulse: 67   Resp: 20   Temp: 36.6 C     Last Pain:  Vitals:   08/01/16 0636  TempSrc:   PainSc: 3          Complications: No apparent anesthesia complications

## 2016-08-01 NOTE — H&P (View-Only) (Signed)
Patient name: Albert Morris MRN: [id number] DOB: March 11, 1950 Sex: male   REASON FOR CONSULT:    Left carotid stenosis. The consult is requested by Kerin Ransom, PA  HPI:   Albert Morris is a 66 y.o. male, who was referred for evaluation of a left carotid stenosis. He presented to [facility] 06/24/16 for dizziness and was found to have right cerebellar ischemia and posterior circulation disease. He has was noted to be hypertensive on admission. CTA of the neck showed 70-80% left proximal ICA stenosis -it was not felt to be causing his symptoms but needs to be addressed by vascular surgery as an outpatient.   The patient is right-handed. He developed sudden dizziness prior to his admission which resolved and then returned. He was therefore admitted and underwent extensive workup. Prior to this event he was not on any medication. He was started on aspirin, Plavix, and a statin. He denies any focal weakness or paresthesias. He denies expressive or receptive aphasia or amaurosis fugax.  His risk factors for peripheral vascular disease include hypertension, hypercholesterolemia, and a remote history of tobacco use.  He tells that he had a silent myocardial infarction in the remote past. He denies any recent chest pain or chest pressure.  Past Medical History:  Diagnosis Date  . CVA (cerebral vascular accident) ([place]) 06/21/2016  . Hyperlipidemia   . Hypertension   . Myocardial infarction ([place])   . Substance abuse     Family History  Problem Relation Age of Onset  . Breast cancer Mother        She is 33 years old, in [place]  . CVA Mother   . CAD Mother   . Arthritis Mother   . Cancer Mother   . Hypertension Mother   . Other Father        gunshot wound  . Early death Father        GSW  . Cancer Brother   . Cancer Brother   . Stroke Maternal Aunt   . Alcohol abuse Maternal Uncle   . Stroke Maternal Grandfather     SOCIAL HISTORY: Social History   Social History  .  Marital status: Divorced    Spouse name: N/A  . Number of children: 2  . Years of education: 12   Occupational History  . disabled     alcoholic   Social History Main Topics  . Smoking status: Former Smoker    Packs/day: 0.50    Years: 29.00    Quit date: 2006  . Smokeless tobacco: Never Used  . Alcohol use No     Comment: quit 2006  . Drug use: No     Comment: remote h/o heavy marijuana use, also used cocaine and others  . Sexual activity: Not Currently   Other Topics Concern  . Not on file   Social History Narrative   Divorced   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army for 8 years, EMT certified      Lives alone   Likes to walk      2 sons -in [place]       No Known Allergies  Current Outpatient Prescriptions  Medication Sig Dispense Refill  . aspirin 325 MG tablet Take 1 tablet (325 mg total) by mouth daily. 30 tablet 3  . atorvastatin (LIPITOR) 40 MG tablet Take 1 tablet (40 mg total) by mouth daily at 6 PM. 30 tablet 3  . carvedilol (COREG) 3.125 MG tablet Take 1 tablet (3.125  mg total) by mouth 2 (two) times daily with a meal. 60 tablet 1  . clopidogrel (PLAVIX) 75 MG tablet Take 1 tablet (75 mg total) by mouth daily. 30 tablet 3  . lisinopril (PRINIVIL,ZESTRIL) 5 MG tablet Take 1 tablet (5 mg total) by mouth daily. 30 tablet 1   No current facility-administered medications for this visit.     REVIEW OF SYSTEMS:  [X]  denotes positive finding, [ ]  denotes negative finding Cardiac  Comments:  Chest pain or chest pressure:    Shortness of breath upon exertion:    Short of breath when lying flat:    Irregular heart rhythm:        Vascular    Pain in calf, thigh, or hip brought on by ambulation:    Pain in feet at night that wakes you up from your sleep:     Blood clot in your veins:    Leg swelling:         Pulmonary    Oxygen at home:    Productive cough:     Wheezing:         Neurologic    Sudden weakness in arms or legs:     Sudden numbness in arms or  legs:     Sudden onset of difficulty speaking or slurred speech:    Temporary loss of vision in one eye:     Problems with dizziness:  X       Gastrointestinal    Blood in stool:     Vomited blood:         Genitourinary    Burning when urinating:     Blood in urine:        Psychiatric    Major depression:         Hematologic    Bleeding problems:    Problems with blood clotting too easily:        Skin    Rashes or ulcers:        Constitutional    Fever or chills:     PHYSICAL EXAM:   Vitals:   07/23/16 1027 07/23/16 1028  BP: (!) 161/91 (!) 158/87  Pulse: 69   Resp: 20   Temp: 97.8 F (36.6 C)   TempSrc: Oral   SpO2: 96%   Weight: 215 lb 8 oz (97.8 kg)   Height: 6\' 2"  (1.88 m)     GENERAL: The patient is a well-nourished male, in no acute distress. The vital signs are documented above. CARDIAC: There is a regular rate and rhythm.  VASCULAR: He does have a left carotid bruit. On the right side he has a palpable femoral and popliteal pulse. I cannot palpate pedal pulses. On the left side, he has a palpable femoral, popliteal, and posterior tibial pulse. He has no significant lower extremity swelling.  PULMONARY: There is good air exchange bilaterally without wheezing or rales. ABDOMEN: Soft and non-tender with normal pitched bowel sounds.  MUSCULOSKELETAL: There are no major deformities or cyanosis. NEUROLOGIC: No focal weakness or paresthesias are detected. SKIN: There are no ulcers or rashes noted. PSYCHIATRIC: The patient has a normal affect.  DATA:    CAROTID DUPLEX: I haven't been interpreted his carotid duplex scan today.  On the right side, he has a less than 39% stenosis.  On the left side, he has a greater than 80% stenosis.  CT ANGIOGRAM NECK: This showed a 70-80% proximal left internal carotid artery stenosis. I have reviewed these images, there is a  calcific plaque in both carotid arteries. These plaques are heavily calcified  MRI BRAIN: I  have reviewed the MRI of the brain that was done on 06/24/2016. This showed multifocal acute ischemic infarcts in the right cerebellar hemisphere within the right posterior inferior cerebellar artery distribution. The patient was also noted to have extensive multifocal intracranial atherosclerotic disease with moderate stenosis of multiple distal middle cerebral artery branches. There was also spinal canal stenosis at C3-C4.  ECHO: This patient had a neck on 06/26/2016. This showed that the left ventricular cavity size was normal. The patient had moderate left ventricular hypertrophy. Systolic function was mildly reduced. Estimated ejection fraction was 45-50%. 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 The patient had grade 1 diastolic dysfunction. There was mild regurgitation of the aortic valve. The aortic root measured 4.5 cm.  MYOCARDIAL PERFUSION STUDY: This study is pending.  MEDICAL ISSUES:   BILATERAL CAROTID STENOSES:   By CT angiogram, this patient had bilateral carotid plaques. The stenosis on the left was estimated at 70-80%. However, because of the calcific disease It is sometimes difficult to accurately determine the severity of the stenosis by CT scan. For this reason I obtained a carotid duplex scan in the office today. This shows a greater than 80% left carotid stenosis. I have recommended left carotid endarterectomy in order to lower his risk of future stroke. I have also reviewed the potential complications of surgery, including but not limited to: bleeding, stroke (perioperative risk 1-2%), MI, nerve injury of other unpredictable medical problems. All of the patients questions were answered and they are agreeable to proceed with surgery.  Given his abnormal ECHO and his pending myocardial perfusion study, we will obtain preoperative cardiac clearance before scheduling his surgery. He will stay on his aspirin and Plavix and statin until then. I  would then like to hold his Plavix for 5 days prior to his surgery.  Deitra Mayo [facility] [phone]

## 2016-08-01 NOTE — Anesthesia Postprocedure Evaluation (Signed)
Anesthesia Post Note  Patient: BERNARDO BRAYMAN  Procedure(s) Performed: Procedure(s) (LRB): ENDARTERECTOMY LEFT CAROTID (Left) PATCH ANGIOPLASTY LEFT CAROTID ARTERY USING ZENOSURE BIOLOGIC PATCH (Left)     Patient location during evaluation: PACU Anesthesia Type: General Level of consciousness: awake and alert Pain management: pain level controlled Vital Signs Assessment: post-procedure vital signs reviewed and stable Respiratory status: spontaneous breathing, nonlabored ventilation, respiratory function stable and patient connected to nasal cannula oxygen Cardiovascular status: blood pressure returned to baseline and stable Postop Assessment: no signs of nausea or vomiting Anesthetic complications: no    Last Vitals:  Vitals:   08/01/16 1500 08/01/16 1530  BP: (!) 97/56 110/60  Pulse: (!) 53 (!) 53  Resp: 15 15  Temp:      Last Pain:  Vitals:   08/01/16 1445  TempSrc:   PainSc: 0-No pain                 Tani Virgo DAVID

## 2016-08-01 NOTE — Interval H&P Note (Signed)
History and Physical Interval Note:  08/01/2016 7:21 AM  Albert Morris  has presented today for surgery, with the diagnosis of Left Internal Carotid Artery Stenosis  I65.22  The various methods of treatment have been discussed with the patient and family. After consideration of risks, benefits and other options for treatment, the patient has consented to  Procedure(s): ENDARTERECTOMY LEFT CAROTID (Left) as a surgical intervention .  The patient's history has been reviewed, patient examined, no change in status, stable for surgery.  I have reviewed the patient's chart and labs.  Questions were answered to the patient's satisfaction.     Deitra Mayo

## 2016-08-01 NOTE — Op Note (Signed)
    NAME: Albert Morris    MRN: [id number] DOB: [birth year]    DATE OF OPERATION: 08/01/2016  PREOP DIAGNOSIS:    Greater than 80% left carotid stenosis  POSTOP DIAGNOSIS:    Same  PROCEDURE:    LEFT CAROTID ENDARTERECTOMY WITH BOVINE PERICARDIAL PATCH ANGIOPLASTY   SURGEON: Judeth Cornfield. Scot Dock, MD, FACS  ASSIST: Leontine Locket, PA  ANESTHESIA: Gen.   EBL: Minimal  INDICATIONS:    Albert Morris is a 67 y.o. male who was admitted in May with a right cerebellar and posterior circulation infarct. He was found to have a 70-80% left carotid stenosis and was referred for vascular consultation. Our duplex suggest a greater than 80% left carotid stenosis and left carotid endarterectomy was recommended in order to lower his risk of future stroke. He had a less than 39% right carotid stenosis.  FINDINGS:    Greater than 80% left carotid stenosis  TECHNIQUE:    The patient was taken to the operating room and received a general anesthetic. The neck and upper chest were prepped and draped in the usual sterile fashion. An incision was made along the anterior border of the sternocleidomastoid and the dissection carried down to the common carotid artery which was dissected free and controlled with Rummel tourniquet. Of note there was significant inflammation around the artery which was unusual. I was able to identify a soft spot on the common carotid artery. There were multiple branches of the facial vein which were all individually suture ligated and divided. This allowed exposure of the carotid bifurcation. The internal carotid artery was controlled above the plaque and the superior thyroid artery and external carotid arteries were controlled. The patient was heparinized.  Clamps were then placed on the internal then the external then the common carotid artery. A longitudinal arteriotomy was made in the common carotid artery and this was extended through the plaque into the internal  carotid artery. A 10 shunt was placed into the internal carotid artery, backbled and then placed into the common carotid artery and secured with Rummel tourniquet. Flow was reestablished through the shunt. An endarterectomy plane was established proximally and the plaque was sharply divided. Eversion endarterectomy was performed of the external carotid artery. Distally gives a nice taper and the plaque and no tacking sutures were required. The artery was irrigated with Heparin and Dextran and All Loose Debris Removed. A Bovine Pericardial Patch Was Then Sewn Using Continuous 6-0 Prolene Suture.  Prior to completing the patch closure, the shunt was removed, the artery was backbled and flushed appropriately and the anastomosis completed. Flow was reestablished first to the external carotid artery and into the internal carotid artery. At the completion there was an excellent Doppler signal distal to the patch with good diastolic flow. Hemostasis was obtained in the wound and the heparin was partially reversed with protamine. The wound was closed the deep layer 3-0 Vicryl, the platysma was closed with running 3-0 Vicryl, the skin was closed with 4-0 Vicryl. Dermabond was applied. The patient tolerated the procedure well and awoke neurologically intact. All needle and sponge counts were correct. He was transferred to the recovery room in stable condition.  Deitra Mayo, MD, FACS [facility]  DATE OF DICTATION:   08/01/2016

## 2016-08-01 NOTE — Telephone Encounter (Signed)
Sched appt 08/28/16 at 8:30. Lm on hm# for pt to confirm appt.

## 2016-08-01 NOTE — Discharge Summary (Signed)
Discharge Summary     Albert Morris [birth year] 66 y.o. 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 male who was referred for evaluation of a left carotid stenosis. 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 He has was noted to be hypertensive on admission. CTA of the neck showed 70-80% left proximal ICA stenosis -it was not felt to be causing his symptoms butneeds to be addressed by vascular surgery as an outpatient.   The patient is right-handed. He developed sudden dizziness prior to his admission which resolved and then returned. He was therefore admitted and underwent extensive workup. Prior to this event he was not on any medication. He was started on aspirin, Plavix, and a statin. He denies any focal weakness or paresthesias. He denies expressive or receptive aphasia or amaurosis fugax.  His risk factors for peripheral vascular disease include hypertension, hypercholesterolemia, and a remote history of tobacco use.  He tells that he had a silent myocardial infarction in the remote past. He denies any recent chest pain or chest pressure.  Hospital Course:  The patient was admitted to the hospital and taken to the operating room on 08/01/2016 and underwent left carotid endarterectomy.  The pt tolerated the procedure well and was transported to the PACU in good condition.   By POD 1, the pt neuro status was in tact.  He was feeling weak all over.  His blood pressure was a little soft and his am BP meds were held.   He was transferred to the telemetry floor.    On POD 2, he was still weak but no focal deficits.  He all of his breakfast without difficulty.  He was discharged home later that afternoon.  The remainder of the  hospital course consisted of increasing mobilization and increasing intake of solids without difficulty.    Recent Labs  07/30/16 1405  NA 137  K 4.1  CL 104  CO2 27  GLUCOSE 108*  BUN 18  CALCIUM 9.5    Recent Labs  07/30/16 1405  WBC 7.7  HGB 12.9*  HCT 39.4  PLT 278    Recent Labs  07/30/16 1405  INR 1.04     Discharge Instructions    CAROTID Sugery: Call MD for difficulty swallowing or speaking; weakness in arms or legs that is a new symtom; severe headache.  If you have increased swelling in the neck and/or  are having difficulty breathing, CALL 911    Complete by:  As directed    Call MD for:  redness, tenderness, or signs of infection (pain, swelling, bleeding, redness, odor or green/yellow discharge around incision site)    Complete by:  As directed    Call MD for:  severe or increased pain, loss or decreased feeling  in affected limb(s)    Complete by:  As directed    Call MD for:  temperature >100.5    Complete by:  As directed    Discharge wound care:    Complete by:  As directed    Shower daily with soap and water starting 08/03/16   Driving Restrictions    Complete by:  As directed    No driving for 2 weeks & while taking pain medication.   Lifting restrictions  Complete by:  As directed    No heavy lifting for 2 weeks   Resume previous diet    Complete by:  As directed       Discharge Diagnosis:  Left Internal Carotid Artery Stenosis  I65.22  Secondary Diagnosis: Patient Active Problem List   Diagnosis Date Noted  . Left carotid artery stenosis 08/01/2016  . History of MI (myocardial infarction) 07/08/2016  . CAD in native artery 07/08/2016  . Pre-diabetes 07/08/2016  . History of substance abuse 07/08/2016  . Essential hypertension 07/07/2016  . Dyslipidemia 07/07/2016  . Dizziness   . Stenosis of left carotid artery   . Cardiomyopathy ([place])   . Cerebellar stroke ([place]) 06/24/2016   Past Medical History:  Diagnosis Date  . CVA  (cerebral vascular accident) ([place]) 06/21/2016  . Hyperlipidemia   . Hypertension   . Myocardial infarction ([place])   . Substance abuse     Allergies as of 08/01/2016      Reactions   No Known Allergies       Medication List    TAKE these medications   aspirin 325 MG tablet Take 1 tablet (325 mg total) by mouth daily.   atorvastatin 40 MG tablet Commonly known as:  LIPITOR Take 1 tablet (40 mg total) by mouth daily at 6 PM.   carvedilol 3.125 MG tablet Commonly known as:  COREG Take 1 tablet (3.125 mg total) by mouth 2 (two) times daily with a meal.   clopidogrel 75 MG tablet Commonly known as:  PLAVIX Take 1 tablet (75 mg total) by mouth daily.   lisinopril 5 MG tablet Commonly known as:  PRINIVIL,ZESTRIL Take 1 tablet (5 mg total) by mouth daily.   oxyCODONE-acetaminophen 5-325 MG tablet Commonly known as:  ROXICET Take 1 tablet by mouth every 6 (six) hours as needed for severe pain.       Prescriptions given: Oxycodone #8 No Refill  Instructions: 1.  No driving x 2 weeks & while taking pain medication 2.  No heavy lifting x 2 weeks 3.  Shower daily with soap and water starting 08/03/16  Disposition: home  Patient's condition: is Good  Follow up: 1. Dr. Scot Dock in 2 weeks.   Leontine Locket, PA-C [facility] [phone]   --- For [place] use ---   Modified Rankin score at D/C (0-6): 0  IV medication needed for:  1. Hypertension: No 2. Hypotension: No  Post-op Complications: No  1. Post-op CVA or TIA: No  If yes: Event classification (right eye, left eye, right cortical, left cortical, verterobasilar, other): n/a  If yes: Timing of event (intra-op, <6 hrs post-op, >=6 hrs post-op, unknown): n/a  2. CN injury: No  If yes: CN n/a injuried   3. Myocardial infarction: No  If yes: Dx by (EKG or clinical, Troponin): n/a  4.  CHF: No  5.  Dysrhythmia (new): No  6. Wound infection: No  7. Reperfusion symptoms:  No  8. Return to OR: No  If yes: return to OR for (bleeding, neurologic, other CEA incision, other): n/a  Discharge medications: Statin use:  Yes   If No:   ASA use:  Yes  If No:   Beta blocker use:  Yes ACE-Inhibitor use:  Yes  ARB use:  No CCB use: No P2Y12 Antagonist use: Yes, [x ] Plavix, [ ]  Plasugrel, [ ]  Ticlopinine, [ ]  Ticagrelor, [ ]  Other, [ ]  No for medical reason, [ ]  Non-compliant, [ ]  Not-indicated Anti-coagulant use:  No, [ ]  Warfarin, [ ]  Rivaroxaban, [ ]  Dabigatran,

## 2016-08-01 NOTE — Anesthesia Preprocedure Evaluation (Signed)
Anesthesia Evaluation  Patient identified by MRN, date of birth, ID band Patient awake    Reviewed: Allergy & Precautions, NPO status , Patient's Chart, lab work & pertinent test results  Airway Mallampati: I  TM Distance: >3 FB Neck ROM: Full    Dental   Pulmonary former smoker,    Pulmonary exam normal        Cardiovascular hypertension, Pt. on medications + Past MI  Normal cardiovascular exam     Neuro/Psych    GI/Hepatic   Endo/Other    Renal/GU      Musculoskeletal   Abdominal   Peds  Hematology   Anesthesia Other Findings   Reproductive/Obstetrics                             Anesthesia Physical Anesthesia Plan  ASA: III  Anesthesia Plan: General   Post-op Pain Management:    Induction: Intravenous  PONV Risk Score and Plan: 2 and Ondansetron and Dexamethasone  Airway Management Planned: Oral ETT  Additional Equipment: Arterial line  Intra-op Plan:   Post-operative Plan: Extubation in OR  Informed Consent: I have reviewed the patients History and Physical, chart, labs and discussed the procedure including the risks, benefits and alternatives for the proposed anesthesia with the patient or authorized representative who has indicated his/her understanding and acceptance.     Plan Discussed with: CRNA and Surgeon  Anesthesia Plan Comments:         Anesthesia Quick Evaluation

## 2016-08-01 NOTE — Telephone Encounter (Signed)
-----   Message from Mena Goes, RN sent at 08/01/2016 10:35 AM EDT ----- Regarding: 2-3 weeks   ----- Message ----- From: Gabriel Earing, PA-C Sent: 08/01/2016   9:39 AM To: Vvs Charge Pool  Left CEA 08/01/16.  F/u with Dr. Scot Dock in 2-3 weeks.  Thanks

## 2016-08-01 NOTE — Anesthesia Procedure Notes (Addendum)
Procedure Name: Intubation Date/Time: 08/01/2016 7:42 AM Performed by: Lance Coon Pre-anesthesia Checklist: Patient identified, Emergency Drugs available, Suction available, Patient being monitored and Timeout performed Patient Re-evaluated:Patient Re-evaluated prior to inductionOxygen Delivery Method: Circle system utilized Preoxygenation: Pre-oxygenation with 100% oxygen Intubation Type: IV induction Ventilation: Mask ventilation without difficulty Laryngoscope Size: Mac and 4 Grade View: Grade IV Tube type: Oral Tube size: 7.5 mm Number of attempts: 2 Airway Equipment and Method: Stylet Placement Confirmation: ETT inserted through vocal cords under direct vision,  positive ETCO2 and breath sounds checked- equal and bilateral Secured at: 22 cm Tube secured with: Tape Dental Injury: Teeth and Oropharynx as per pre-operative assessment  Comments: SRNA attempt x1, Dr Conrad Garrett attempt x1 successful

## 2016-08-01 NOTE — Care Management Note (Signed)
Case Management Note  Patient Details  Name: Albert Morris MRN: [id number] Date of Birth: [birth year]  Subjective/Objective:  From home alone, s/p L CEA, patient for dc , no needs.  He has [employer]  PCP   Cristela Blue               Action/Plan: NCM will follow for dc needs.   Expected Discharge Date:  08/01/16               Expected Discharge Plan:  Home/Self Care  In-House Referral:     Discharge planning Services  CM Consult  Post Acute Care Choice:    Choice offered to:     DME Arranged:    DME Agency:     HH Arranged:    HH Agency:     Status of Service:  Completed, signed off  If discussed at [employer] of Stay Meetings, dates discussed:    Additional Comments:  Zenon Mayo, RN 08/01/2016, 3:30 PM

## 2016-08-01 NOTE — Progress Notes (Signed)
   VASCULAR SURGERY POSTOP CHECK:   Stable post op  BP responded to fluid. BP now 116/60  Anticipate discharge in AM.   SUBJECTIVE:   No complaints.   PHYSICAL EXAM:   Vitals:   08/01/16 1426 08/01/16 1445 08/01/16 1455 08/01/16 1500  BP: (!) 88/55 (!) 88/54 (!) 89/54 (!) 97/56  Pulse: (!) 57 (!) 53 (!) 55 (!) 53  Resp: 18 (!) 23 15 15   Temp: 98.8 F (37.1 C)     TempSrc: Oral     SpO2: 97% 98% 97% 96%  Weight: 214 lb 1.1 oz (97.1 kg)     Height: 6\' 2"  (1.88 m)      Neuro intact Incision looks fine.   LABS:   Lab Results  Component Value Date   WBC 7.7 07/30/2016   HGB 12.9 (L) 07/30/2016   HCT 39.4 07/30/2016   MCV 87.6 07/30/2016   PLT 278 07/30/2016   Lab Results  Component Value Date   CREATININE 1.20 07/30/2016   Lab Results  Component Value Date   INR 1.04 07/30/2016    PROBLEM LIST:    Active Problems:   Left carotid artery stenosis   CURRENT MEDS:   . [START ON 08/02/2016] aspirin  325 mg Oral Daily  . atorvastatin  40 mg Oral q1800  . carvedilol  3.125 mg Oral BID WC  . [START ON 08/02/2016] clopidogrel  75 mg Oral Daily  . [START ON 08/02/2016] docusate sodium  100 mg Oral Daily  . HYDROmorphone      . lisinopril  5 mg Oral Daily  . pantoprazole  40 mg Oral Daily    Gae Gallop Beeper: [phone] Office: [phone] 08/01/2016

## 2016-08-02 LAB — CBC
HEMATOCRIT: 30.7 % — AB (ref 39.0–52.0)
Hemoglobin: 10.3 g/dL — ABNORMAL LOW (ref 13.0–17.0)
MCH: 29.1 pg (ref 26.0–34.0)
MCHC: 33.6 g/dL (ref 30.0–36.0)
MCV: 86.7 fL (ref 78.0–100.0)
Platelets: 213 10*3/uL (ref 150–400)
RBC: 3.54 MIL/uL — ABNORMAL LOW (ref 4.22–5.81)
RDW: 13.1 % (ref 11.5–15.5)
WBC: 8.7 10*3/uL (ref 4.0–10.5)

## 2016-08-02 LAB — BASIC METABOLIC PANEL
ANION GAP: 7 (ref 5–15)
BUN: 18 mg/dL (ref 6–20)
CALCIUM: 8 mg/dL — AB (ref 8.9–10.3)
CO2: 22 mmol/L (ref 22–32)
Chloride: 106 mmol/L (ref 101–111)
Creatinine, Ser: 1.2 mg/dL (ref 0.61–1.24)
GFR calc Af Amer: >60 mL/min (ref >60)
GLUCOSE: 109 mg/dL — AB (ref 65–99)
Potassium: 3.8 mmol/L (ref 3.5–5.1)
SODIUM: 135 mmol/L (ref 135–145)

## 2016-08-02 NOTE — Progress Notes (Signed)
Called report to RN on [facility], I will let pt eat lunch then transfer in wheelchair on tele.

## 2016-08-02 NOTE — Progress Notes (Signed)
  Progress Note    08/02/2016 7:21 AM 1 Day Post-Op  Subjective:  "I'm feeling weak all over and I feel warm".  (When specifically asked if he felt weak on one side of the body, he said no, I just feel weak all over).  Denies any difficulty swallowing.  Tm 99.7 now 99.5 HR 50's-70's  55'H-741'U systolic (98 systolic this am) 38% RA   Vitals:   08/02/16 0000 08/02/16 0416  BP: 124/70 131/81  Pulse: 76 76  Resp: (!) 21 (!) 21  Temp: 99.7 F (37.6 C) 99.5 F (37.5 C)     Physical Exam: Neuro:  In tact and tongue is midline; moving all extremities equally. Lungs:  Non labored Incision:  Clean and dry without hematoma  CBC    Component Value Date/Time   WBC 8.7 08/02/2016 0348   RBC 3.54 (L) 08/02/2016 0348   HGB 10.3 (L) 08/02/2016 0348   HCT 30.7 (L) 08/02/2016 0348   PLT 213 08/02/2016 0348   MCV 86.7 08/02/2016 0348   MCH 29.1 08/02/2016 0348   MCHC 33.6 08/02/2016 0348   RDW 13.1 08/02/2016 0348   LYMPHSABS 2.6 06/24/2016 1320   MONOABS 0.8 06/24/2016 1320   EOSABS 0.5 06/24/2016 1320   BASOSABS 0.1 06/24/2016 1320    BMET    Component Value Date/Time   NA 135 08/02/2016 0348   K 3.8 08/02/2016 0348   CL 106 08/02/2016 0348   CO2 22 08/02/2016 0348   GLUCOSE 109 (H) 08/02/2016 0348   BUN 18 08/02/2016 0348   CREATININE 1.20 08/02/2016 0348   CALCIUM 8.0 (L) 08/02/2016 0348   GFRNONAA >60 08/02/2016 0348   GFRAA >60 08/02/2016 0348     Intake/Output Summary (Last 24 hours) at 08/02/16 0721 Last data filed at 08/02/16 0700  Gross per 24 hour  Intake          3041.67 ml  Output             1150 ml  Net          1891.67 ml     Assessment/Plan:  This is a 66 y.o. male who is s/p left CEA 1 Day Post-Op  -pt feels weak this am-his weakness is generalized and not one side of the body; he is neurologically intact, tongue is midline and moving all extremities equally. -his systolic BP is in the 45'X this am.  Will hold his am BP medications and  especially his coreg given his HR is in the 50's.   -he has not ambulated this morning. -he is voiding without difficulty -he may need another day and possibly PT today    Leontine Locket, PA-C [facility] [phone]

## 2016-08-03 NOTE — Progress Notes (Signed)
Subjective: Interval History: none.. Reports that he still feels weak all over. 8 all of his breakfast without difficulty.  Objective: Vital signs in last 24 hours: Temp:  [98.4 F (36.9 C)-98.9 F (37.2 C)] 98.5 F (36.9 C) (06/17 0440) Pulse Rate:  [55-67] 67 (06/17 0440) Resp:  [11-20] 18 (06/17 0440) BP: (100-137)/(48-75) 137/75 (06/17 0440) SpO2:  [95 %-99 %] 97 % (06/17 0440)  Intake/Output from previous day: 06/16 0701 - 06/17 0700 In: 1111.7 [P.O.:960; I.V.:151.7] Out: 1900 [Urine:1900] Intake/Output this shift: No intake/output data recorded.  neck incision healing nicely. Neurologically intact.  Lab Results:  Recent Labs  08/02/16 0348  WBC 8.7  HGB 10.3*  HCT 30.7*  PLT 213   BMET  Recent Labs  08/02/16 0348  NA 135  K 3.8  CL 106  CO2 22  GLUCOSE 109*  BUN 18  CREATININE 1.20  CALCIUM 8.0*    Studies/Results: No results found. Anti-infectives: Anti-infectives    Start     Dose/Rate Route Frequency Ordered Stop   08/01/16 2000  cefUROXime (ZINACEF) 1.5 g in dextrose 5 % 50 mL IVPB     1.5 g 100 mL/hr over 30 Minutes Intravenous Every 12 hours 08/01/16 0951 08/02/16 0932   08/01/16 0623  cefUROXime (ZINACEF) 1.5 g in dextrose 5 % 50 mL IVPB     1.5 g 100 mL/hr over 30 Minutes Intravenous 30 min pre-op 08/01/16 0623 08/01/16 0750      Assessment/Plan: s/p Procedure(s): ENDARTERECTOMY LEFT CAROTID (Left) PATCH ANGIOPLASTY LEFT CAROTID ARTERY USING ZENOSURE BIOLOGIC PATCH (Left) stable overall. Postop day 2 from carotid. Reports she still feels weak all over. No focal deficits. We'll mobilize furtrning. Possible home this afternoon if comfortable   LOS: 2 days   Albert Morris 08/03/2016, 9:30 AM

## 2016-08-04 ENCOUNTER — Encounter (HOSPITAL_COMMUNITY): Payer: Self-pay | Admitting: Vascular Surgery

## 2016-08-04 DIAGNOSIS — I252 Old myocardial infarction: Secondary | ICD-10-CM | POA: Diagnosis not present

## 2016-08-04 DIAGNOSIS — I2510 Atherosclerotic heart disease of native coronary artery without angina pectoris: Secondary | ICD-10-CM | POA: Diagnosis not present

## 2016-08-04 DIAGNOSIS — I10 Essential (primary) hypertension: Secondary | ICD-10-CM | POA: Diagnosis not present

## 2016-08-04 DIAGNOSIS — Z48812 Encounter for surgical aftercare following surgery on the circulatory system: Secondary | ICD-10-CM | POA: Diagnosis not present

## 2016-08-08 ENCOUNTER — Telehealth: Payer: Self-pay | Admitting: *Deleted

## 2016-08-08 DIAGNOSIS — I10 Essential (primary) hypertension: Secondary | ICD-10-CM | POA: Diagnosis not present

## 2016-08-08 DIAGNOSIS — I252 Old myocardial infarction: Secondary | ICD-10-CM | POA: Diagnosis not present

## 2016-08-08 DIAGNOSIS — Z48812 Encounter for surgical aftercare following surgery on the circulatory system: Secondary | ICD-10-CM | POA: Diagnosis not present

## 2016-08-08 DIAGNOSIS — I2510 Atherosclerotic heart disease of native coronary artery without angina pectoris: Secondary | ICD-10-CM | POA: Diagnosis not present

## 2016-08-08 NOTE — Telephone Encounter (Signed)
Joelene Millin, [facility] nurse [phone]), called to report that she has not been able to reach patient and start care. In the past 2 days, she has made numerous phone calls. She has been to the house several times but no one answers the door. Patient had a left CEA on 08-01-16 by Dr. Scot Dock. She will continue to try and contact patient.

## 2016-08-11 ENCOUNTER — Encounter: Payer: Self-pay | Admitting: Nurse Practitioner

## 2016-08-11 ENCOUNTER — Ambulatory Visit (INDEPENDENT_AMBULATORY_CARE_PROVIDER_SITE_OTHER): Payer: Medicare HMO | Admitting: Nurse Practitioner

## 2016-08-11 VITALS — BP 138/85 | HR 70 | Ht 74.0 in | Wt 210.8 lb

## 2016-08-11 DIAGNOSIS — E785 Hyperlipidemia, unspecified: Secondary | ICD-10-CM

## 2016-08-11 DIAGNOSIS — I10 Essential (primary) hypertension: Secondary | ICD-10-CM

## 2016-08-11 DIAGNOSIS — I639 Cerebral infarction, unspecified: Secondary | ICD-10-CM

## 2016-08-11 NOTE — Progress Notes (Signed)
[facility]  PATIENT: Albert Morris DOB: 03-16-1950   REASON FOR VISIT: Hospital follow-up for right cerebellar infarct HISTORY FROM: Patient    HISTORY OF PRESENT ILLNESS:FROM RECORDWallace L Gallowayis a 66 y.o.malewho has had 2 episodes of dizziness over the past couple of days. He states that the first one lasted 30 minutes, slightly longer today. He does not feel that he has hadn't any difficulty walking since that time.  He presented [facility] where an MRI was performed which demonstrated right cerebellar ischemia. He was therefore transferred to [facility] for further evaluation.  He was LKW 06/22/16 prior to bed. Patient was not administered IV t-PA secondary to being outside of window. He was admitted for further evaluation and treatment. MRI right cerebellar infarct. MRA right PICA occlusion versus stenosis. Extensive intracranial atherosclerosis. CT of the head mild small vessel disease and atrophy and right cerebellar attenuation. CTA of the head severe stenosis or occlusion right V3/V4  Junction. Severe mid BA stenosis. Moderate to severe right and moderate left paraclinoid stenosis. Extensive atherosclerosis circle of Willis. CTA of the neck left ICA 70-80% stenosis with mixed plaque. 2-D echo 45-50% EF. LDL 161. Hemoglobin A1c 6.4 Interval history 06/25/2018CM Albert Morris, 66 year old male returns for follow-up stroke clinic today after an admission for stroke right cerebellar infarct in May 2018. He is currently on Plavix and aspirin for secondary stroke prevention for 3 months post discharge. He was not on anti- thrombotic prior to admission He is also on Lipitor for hyperlipidemia without complaints of myalgias.  Blood pressure in the office today 138/85 . He had left carotid endarterectomy on 08/01/2016 by Dr. Scot Dock. He is no longer taking pain medication and is back to driving. He returns for reevaluation    REVIEW OF SYSTEMS: Full 14  system review of systems performed and notable only for those listed, all others are neg:  Constitutional: neg  Cardiovascular: neg Ear/Nose/Throat: neg  Skin: neg Eyes: neg Respiratory: neg Gastroitestinal: neg  Hematology/Lymphatic: neg  Endocrine: neg Musculoskeletal:neg Allergy/Immunology: neg Neurological: neg Psychiatric: neg Sleep : neg   ALLERGIES: Allergies  Allergen Reactions  . No Known Allergies     HOME MEDICATIONS: Outpatient Medications Prior to Visit  Medication Sig Dispense Refill  . aspirin 325 MG tablet Take 1 tablet (325 mg total) by mouth daily. 30 tablet 3  . atorvastatin (LIPITOR) 40 MG tablet Take 1 tablet (40 mg total) by mouth daily at 6 PM. 30 tablet 3  . carvedilol (COREG) 3.125 MG tablet Take 1 tablet (3.125 mg total) by mouth 2 (two) times daily with a meal. 60 tablet 1  . clopidogrel (PLAVIX) 75 MG tablet Take 1 tablet (75 mg total) by mouth daily. 30 tablet 3  . lisinopril (PRINIVIL,ZESTRIL) 5 MG tablet Take 1 tablet (5 mg total) by mouth daily. 30 tablet 1  . oxyCODONE-acetaminophen (ROXICET) 5-325 MG tablet Take 1 tablet by mouth every 6 (six) hours as needed for severe pain. 8 tablet 0   No facility-administered medications prior to visit.     PAST MEDICAL HISTORY: Past Medical History:  Diagnosis Date  . CVA (cerebral vascular accident) ([place]) 06/21/2016  . Hyperlipidemia   . Hypertension   . Myocardial infarction ([place])   . Substance abuse     PAST SURGICAL HISTORY: Past Surgical History:  Procedure Laterality Date  . [place]   growth removed  . ENDARTERECTOMY Left 08/01/2016   Procedure: ENDARTERECTOMY LEFT CAROTID;  Surgeon: [name],  MD;  Location: [place];  Service: Vascular;  Laterality: Left;  . PATCH ANGIOPLASTY Left 08/01/2016   Procedure: PATCH ANGIOPLASTY LEFT CAROTID ARTERY USING ZENOSURE BIOLOGIC PATCH;  Surgeon: [name], MD;  Location: [place];  Service: Vascular;  Laterality:  Left;  . TONSILLECTOMY      FAMILY HISTORY: Family History  Problem Relation Age of Onset  . Breast cancer Mother        She is 71 years old, in [place]  . CVA Mother   . CAD Mother   . Arthritis Mother   . Cancer Mother   . Hypertension Mother   . Other Father        gunshot wound  . Early death Father        GSW  . Cancer Brother   . Cancer Brother   . Stroke Maternal Aunt   . Alcohol abuse Maternal Uncle   . Stroke Maternal Grandfather     SOCIAL HISTORY: Social History   Social History  . Marital status: Divorced    Spouse name: N/A  . Number of children: 2  . Years of education: 12   Occupational History  . disabled     alcoholic   Social History Main Topics  . Smoking status: Former Smoker    Packs/day: 0.50    Years: 29.00    Quit date: 2006  . Smokeless tobacco: Never Used  . Alcohol use No     Comment: quit 2006  . Drug use: No     Comment: remote h/o heavy marijuana use, also used cocaine and others  . Sexual activity: Not Currently   Other Topics Concern  . Not on file   Social History Narrative   Divorced   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army for 8 years, EMT certified      Lives alone   Likes to walk      2 sons -in [place] and [place]:   08/11/16 1341  BP: 138/85  Pulse: 70  Weight: 210 lb 12.8 oz (95.6 kg)  Height: 6\' 2"  (1.88 m)   Body mass index is 27.07 kg/m.  Generalized: Well developed, in no acute distress  Head: normocephalic and atraumatic,. Oropharynx benign  Neck: Supple, no carotid bruits Left carotid endarterectomy incision clean and dry without signs of infection. Cardiac: Regular rate rhythm, no murmur  Musculoskeletal: No deformity   Neurological examination   Mentation: Alert oriented to time, place, history taking. Attention span and concentration appropriate. Recent and remote memory intact.  Follows all commands speech and language fluent.   Cranial nerve II-XII: Pupils were equal round  reactive to light extraocular movements were full, visual field were full on confrontational test. Facial sensation and strength were normal. hearing was intact to finger rubbing bilaterally. Uvula tongue midline. head turning and shoulder shrug were normal and symmetric.Tongue protrusion into cheek strength was normal. Motor: normal bulk and tone, full strength in the BUE, BLE, fine finger movements normal, no pronator drift. No focal weakness Sensory: normal and symmetric to light touch, pinprick, and  Vibration, in the upper and lower extremities Coordination: finger-nose-finger, heel-to-shin bilaterally, no dysmetria, no tremor Reflexes: 1+ upper lower and symmetric plantar responses were flexor bilaterally. Gait and Station: Rising up from seated position without assistance, normal stance,  moderate stride, good arm swing, smooth turning, able to perform tiptoe, and heel walking without difficulty. Tandem gait is steady  DIAGNOSTIC DATA (LABS, IMAGING,  TESTING) - I reviewed patient records, labs, notes, testing and imaging myself where available.  Lab Results  Component Value Date   WBC 8.7 08/02/2016   HGB 10.3 (L) 08/02/2016   HCT 30.7 (L) 08/02/2016   MCV 86.7 08/02/2016   PLT 213 08/02/2016      Component Value Date/Time   NA 135 08/02/2016 0348   K 3.8 08/02/2016 0348   CL 106 08/02/2016 0348   CO2 22 08/02/2016 0348   GLUCOSE 109 (H) 08/02/2016 0348   BUN 18 08/02/2016 0348   CREATININE 1.20 08/02/2016 0348   CALCIUM 8.0 (L) 08/02/2016 0348   PROT 7.6 07/30/2016 1405   ALBUMIN 4.2 07/30/2016 1405   AST 25 07/30/2016 1405   ALT 21 07/30/2016 1405   ALKPHOS 66 07/30/2016 1405   BILITOT 0.5 07/30/2016 1405   GFRNONAA >60 08/02/2016 0348   GFRAA >60 08/02/2016 0348   Lab Results  Component Value Date   CHOL 220 (H) 06/25/2016   HDL 40 (L) 06/25/2016   LDLCALC 161 (H) 06/25/2016   TRIG 95 06/25/2016   CHOLHDL 5.5 06/25/2016   Lab Results  Component Value Date    HGBA1C 6.4 (H) 06/25/2016    Lab Results  Component Value Date   TSH 3.086 06/24/2016      ASSESSMENT AND PLAN  66 y.o. year old male  has a past medical history of CVA (cerebral vascular accident) ([place]) (06/21/2016); Hyperlipidemia; Hypertension; Myocardial infarction [facility]); and Substance abuse. here for hospital follow-up for right cerebellar infarct.MRA right PICA occlusion versus stenosis. Extensive intracranial atherosclerosis. CT of the head mild small vessel disease and atrophy and right cerebellar attenuation. CTA of the head severe stenosis or occlusion right V3/V4  Junction. Severe mid BA stenosis. Moderate to severe right and moderate left paraclinoid stenosis. Extensive atherosclerosis circle of Willis. CTA of the neck left ICA 70-80% stenosis with mixed plaque. 2-D echo 45-50% EF. LDL 161. Hemoglobin A1c 6.4  PLAN: Stressed the importance of management of risk factors to prevent further stroke Continue Aspirin and Plavix for 3 months then Plavix alone for secondary stroke prevention around 09/24/16 Maintain strict control of hypertension with blood pressure goal below 130/90, today's reading 138/85 continue antihypertensive medications Control of diabetes with hemoglobin A1c below 6.5 followed by primary care most recent hemoglobin A1c 6.4 Cholesterol with LDL cholesterol less than 70, followed by primary care,  most recent 161 continue statin drugs continue Lipitor Exercise by walking, slowly increase , Eat healthy diet with whole grains,  fresh fruits and vegetables F/U in 6 months Continue to follow up with primary care regarding stroke risk factor management Discussed risk for recurrent stroke/ TIA and answered additional questions This was a visit requiring 30 minutes of medical decision making of high complexity with extensive review of history, hospital chart, counseling and answering questions Dennie Bible, [facility], [facility], APRN  [facility] [address], [place], [place] [address] [phone]

## 2016-08-11 NOTE — Patient Instructions (Signed)
Stressed the importance of management of risk factors to prevent further stroke Continue Aspirin and Plavix for 3 months then Plavix alone for secondary stroke prevention around 09/24/16 Maintain strict control of hypertension with blood pressure goal below 130/90, today's reading 138/85 continue antihypertensive medications Control of diabetes with hemoglobin A1c below 6.5 followed by primary care most recent hemoglobin A1c 6.4 Cholesterol with LDL cholesterol less than 70, followed by primary care,  most recent 161 continue statin drugs continue Lipitor Exercise by walking, slowly increase , Eat healthy diet with whole grains,  fresh fruits and vegetables F/U in 6 months

## 2016-08-12 DIAGNOSIS — I10 Essential (primary) hypertension: Secondary | ICD-10-CM | POA: Diagnosis not present

## 2016-08-12 DIAGNOSIS — I252 Old myocardial infarction: Secondary | ICD-10-CM | POA: Diagnosis not present

## 2016-08-12 DIAGNOSIS — I2510 Atherosclerotic heart disease of native coronary artery without angina pectoris: Secondary | ICD-10-CM | POA: Diagnosis not present

## 2016-08-12 DIAGNOSIS — Z48812 Encounter for surgical aftercare following surgery on the circulatory system: Secondary | ICD-10-CM | POA: Diagnosis not present

## 2016-08-13 NOTE — Progress Notes (Signed)
I reviewed above note and agree with the assessment and plan.  Rosalin Hawking, MD PhD Stroke Neurology 08/13/2016 7:15 AM

## 2016-08-18 ENCOUNTER — Encounter: Payer: Self-pay | Admitting: Vascular Surgery

## 2016-08-19 ENCOUNTER — Other Ambulatory Visit: Payer: Self-pay

## 2016-08-19 DIAGNOSIS — Z48812 Encounter for surgical aftercare following surgery on the circulatory system: Secondary | ICD-10-CM | POA: Diagnosis not present

## 2016-08-19 DIAGNOSIS — I252 Old myocardial infarction: Secondary | ICD-10-CM | POA: Diagnosis not present

## 2016-08-19 DIAGNOSIS — I2510 Atherosclerotic heart disease of native coronary artery without angina pectoris: Secondary | ICD-10-CM | POA: Diagnosis not present

## 2016-08-19 DIAGNOSIS — I10 Essential (primary) hypertension: Secondary | ICD-10-CM | POA: Diagnosis not present

## 2016-08-19 MED ORDER — ATORVASTATIN CALCIUM 40 MG PO TABS: 40.0000 mg | ORAL_TABLET | Freq: Every day | ORAL | 1 refills | Status: DC

## 2016-08-19 MED ORDER — LISINOPRIL 5 MG PO TABS: 5.0000 mg | ORAL_TABLET | Freq: Every day | ORAL | 1 refills | Status: DC

## 2016-08-19 MED ORDER — CLOPIDOGREL BISULFATE 75 MG PO TABS: 75.0000 mg | ORAL_TABLET | Freq: Every day | ORAL | 0 refills | Status: DC

## 2016-08-19 MED ORDER — CARVEDILOL 3.125 MG PO TABS: 3.1250 mg | ORAL_TABLET | Freq: Two times a day (BID) | ORAL | 1 refills | Status: DC

## 2016-08-19 NOTE — Telephone Encounter (Signed)
Seen 6 25 18 

## 2016-08-28 ENCOUNTER — Encounter: Payer: Self-pay | Admitting: Vascular Surgery

## 2016-08-28 ENCOUNTER — Ambulatory Visit (INDEPENDENT_AMBULATORY_CARE_PROVIDER_SITE_OTHER): Payer: Self-pay | Admitting: Vascular Surgery

## 2016-08-28 VITALS — BP 159/92 | HR 66 | Temp 97.3°F | Resp 18 | Ht 74.0 in | Wt 208.0 lb

## 2016-08-28 DIAGNOSIS — I6522 Occlusion and stenosis of left carotid artery: Secondary | ICD-10-CM

## 2016-08-28 NOTE — Progress Notes (Signed)
   Patient name: Albert Morris MRN: [id number] DOB: 05-16-1950 Sex: male  REASON FOR VISIT:    Follow up after left carotid endarterectomy  HPI:   Albert Morris is a pleasant 66 y.o. male who was admitted in May with a right cerebellar and posterior circulation infarct. He was found to have a 70-80% left carotid stenosis and duplex suggested a greater than 80% stenosis. Left carotid endarterectomy was recommended in order to lower his risk of future stroke. On 08/01/2016, he underwent a left carotid endarterectomy with bovine pericardial patch angioplasty. He did well postoperatively and returns for his first postoperative visit.  He denies any focal weakness or paresthesias. He has no problems swallowing and denies hoarseness. His blood pressure has been slightly elevated as was a death in the family right before his surgery and he has been under some stress.  He is on aspirin, Plavix, and a statin.  Current Outpatient Prescriptions  Medication Sig Dispense Refill  . aspirin 325 MG tablet Take 1 tablet (325 mg total) by mouth daily. 30 tablet 3  . atorvastatin (LIPITOR) 40 MG tablet Take 1 tablet (40 mg total) by mouth daily at 6 PM. 90 tablet 1  . carvedilol (COREG) 3.125 MG tablet Take 1 tablet (3.125 mg total) by mouth 2 (two) times daily with a meal. 180 tablet 1  . clopidogrel (PLAVIX) 75 MG tablet Take 1 tablet (75 mg total) by mouth daily. 30 tablet 0  . lisinopril (PRINIVIL,ZESTRIL) 5 MG tablet Take 1 tablet (5 mg total) by mouth daily. 90 tablet 1   No current facility-administered medications for this visit.     REVIEW OF SYSTEMS:  [X]  denotes positive finding, [ ]  denotes negative finding Cardiac  Comments:  Chest pain or chest pressure:    Shortness of breath upon exertion:    Short of breath when lying flat:    Irregular heart rhythm:    Constitutional    Fever or chills:     PHYSICAL EXAM:   Vitals:   08/28/16 0829 08/28/16 0830 08/28/16 0832  BP: (!)  174/92 (!) 176/96 (!) 159/92  Pulse: 66 66 66  Resp: 18    Temp: (!) 97.3 F (36.3 C)    SpO2: 97%    Weight: 208 lb (94.3 kg)    Height: 6\' 2"  (1.88 m)      GENERAL: The patient is a well-nourished male, in no acute distress. The vital signs are documented above. CARDIOVASCULAR: There is a regular rate and rhythm. PULMONARY: There is good air exchange bilaterally without wheezing or rales. His left neck incision looks fine. NEURO: He has no focal weakness or paresthesias.  DATA:   No new data.  MEDICAL ISSUES:   STATUS POST LEFT CAROTID ENDARTERECTOMY: The patient is doing well status post left carotid endarterectomy. He is on aspirin, Plavix, and a statin. He is not a smoker. He has a less than 39% right carotid stenosis. I ordered a follow up carotid duplex scan in 6 months I'll see him back that time. He knows to call sooner if he has problems.  Deitra Mayo [facility] [phone]

## 2016-08-28 NOTE — Progress Notes (Signed)
Vitals:   08/28/16 0829 08/28/16 0830  BP: (!) 174/92 (!) 176/96  Pulse: 66 66  Resp: 18   Temp: (!) 97.3 F (36.3 C)   SpO2: 97%   Weight: 208 lb (94.3 kg)   Height: 6\' 2"  (1.88 m)

## 2016-08-29 DIAGNOSIS — Z48812 Encounter for surgical aftercare following surgery on the circulatory system: Secondary | ICD-10-CM | POA: Diagnosis not present

## 2016-08-29 DIAGNOSIS — I2510 Atherosclerotic heart disease of native coronary artery without angina pectoris: Secondary | ICD-10-CM | POA: Diagnosis not present

## 2016-08-29 DIAGNOSIS — I252 Old myocardial infarction: Secondary | ICD-10-CM | POA: Diagnosis not present

## 2016-08-29 DIAGNOSIS — I10 Essential (primary) hypertension: Secondary | ICD-10-CM | POA: Diagnosis not present

## 2016-09-01 NOTE — Addendum Note (Signed)
Addended by: Lianne Cure A on: 09/01/2016 03:30 PM   Modules accepted: Orders

## 2016-09-17 DIAGNOSIS — Z87898 Personal history of other specified conditions: Secondary | ICD-10-CM | POA: Diagnosis not present

## 2016-09-17 DIAGNOSIS — Z1159 Encounter for screening for other viral diseases: Secondary | ICD-10-CM | POA: Diagnosis not present

## 2016-09-17 DIAGNOSIS — I2510 Atherosclerotic heart disease of native coronary artery without angina pectoris: Secondary | ICD-10-CM | POA: Diagnosis not present

## 2016-09-17 DIAGNOSIS — R399 Unspecified symptoms and signs involving the genitourinary system: Secondary | ICD-10-CM | POA: Diagnosis not present

## 2016-09-17 DIAGNOSIS — E559 Vitamin D deficiency, unspecified: Secondary | ICD-10-CM | POA: Diagnosis not present

## 2016-09-17 DIAGNOSIS — R7303 Prediabetes: Secondary | ICD-10-CM | POA: Diagnosis not present

## 2016-09-17 LAB — COMPLETE METABOLIC PANEL WITH GFR
ALK PHOS: 75 U/L (ref 40–115)
ALT: 17 U/L (ref 9–46)
AST: 17 U/L (ref 10–35)
Albumin: 4 g/dL (ref 3.6–5.1)
BILIRUBIN TOTAL: 0.5 mg/dL (ref 0.2–1.2)
BUN: 17 mg/dL (ref 7–25)
CALCIUM: 9 mg/dL (ref 8.6–10.3)
CO2: 24 mmol/L (ref 20–31)
CREATININE: 1.17 mg/dL (ref 0.70–1.25)
Chloride: 103 mmol/L (ref 98–110)
GFR, EST AFRICAN AMERICAN: 74 mL/min (ref >=60)
GFR, Est Non African American: 64 mL/min (ref >=60)
Glucose, Bld: 103 mg/dL — ABNORMAL HIGH (ref 65–99)
Potassium: 4.8 mmol/L (ref 3.5–5.3)
Sodium: 138 mmol/L (ref 135–146)
TOTAL PROTEIN: 7 g/dL (ref 6.1–8.1)

## 2016-09-17 LAB — URINALYSIS, ROUTINE W REFLEX MICROSCOPIC
BILIRUBIN URINE: NEGATIVE
Glucose, UA: NEGATIVE
Hgb urine dipstick: NEGATIVE
Ketones, ur: NEGATIVE
LEUKOCYTES UA: NEGATIVE
Nitrite: NEGATIVE
PROTEIN: NEGATIVE
Specific Gravity, Urine: 1.015 (ref 1.001–1.035)
pH: 6 (ref 5.0–8.0)

## 2016-09-17 LAB — LIPID PANEL
Cholesterol: 154 mg/dL (ref <200)
HDL: 49 mg/dL (ref >40)
LDL Cholesterol: 92 mg/dL (ref <100)
Total CHOL/HDL Ratio: 3.1 Ratio (ref <5.0)
Triglycerides: 64 mg/dL (ref <150)
VLDL: 13 mg/dL (ref <30)

## 2016-09-17 LAB — CBC
HEMATOCRIT: 36.8 % — AB (ref 38.5–50.0)
HEMOGLOBIN: 12.7 g/dL — AB (ref 13.2–17.1)
MCH: 29.6 pg (ref 27.0–33.0)
MCHC: 34.5 g/dL (ref 32.0–36.0)
MCV: 85.8 fL (ref 80.0–100.0)
MPV: 8.5 fL (ref 7.5–12.5)
Platelets: 307 10*3/uL (ref 140–400)
RBC: 4.29 MIL/uL (ref 4.20–5.80)
RDW: 13.7 % (ref 11.0–15.0)
WBC: 6.4 10*3/uL (ref 3.8–10.8)

## 2016-09-17 LAB — HEPATITIS C ANTIBODY: HCV Ab: NONREACTIVE

## 2016-09-18 ENCOUNTER — Encounter: Payer: Self-pay | Admitting: Family Medicine

## 2016-09-18 LAB — HEMOGLOBIN A1C
HEMOGLOBIN A1C: 6.2 % — AB (ref <5.7)
MEAN PLASMA GLUCOSE: 131 mg/dL

## 2016-09-18 LAB — VITAMIN D 25 HYDROXY (VIT D DEFICIENCY, FRACTURES): VIT D 25 HYDROXY: 15 ng/mL — AB (ref 30–100)

## 2016-09-18 LAB — PSA: PSA: 1.6 ng/mL (ref <=4.0)

## 2016-09-30 ENCOUNTER — Other Ambulatory Visit: Payer: Self-pay

## 2016-09-30 MED ORDER — CLOPIDOGREL BISULFATE 75 MG PO TABS
75.0000 mg | ORAL_TABLET | Freq: Every day | ORAL | 3 refills | Status: DC
Start: 2016-09-30 — End: 2017-04-17

## 2016-09-30 MED ORDER — CLOPIDOGREL BISULFATE 75 MG PO TABS: 75.0000 mg | ORAL_TABLET | Freq: Every day | ORAL | 0 refills | Status: DC

## 2016-09-30 NOTE — Telephone Encounter (Signed)
How long should pt be taking this?

## 2016-09-30 NOTE — Telephone Encounter (Signed)
Long term stroke prevention

## 2016-09-30 NOTE — Addendum Note (Signed)
Addended by: Ova Freshwater on: 09/30/2016 10:04 AM   Modules accepted: Orders

## 2016-10-07 ENCOUNTER — Encounter: Payer: Self-pay | Admitting: Cardiovascular Disease

## 2016-10-07 ENCOUNTER — Ambulatory Visit (INDEPENDENT_AMBULATORY_CARE_PROVIDER_SITE_OTHER): Payer: Medicare HMO | Admitting: Cardiovascular Disease

## 2016-10-07 VITALS — BP 136/84 | HR 69 | Ht 74.0 in | Wt 212.0 lb

## 2016-10-07 DIAGNOSIS — I25118 Atherosclerotic heart disease of native coronary artery with other forms of angina pectoris: Secondary | ICD-10-CM

## 2016-10-07 DIAGNOSIS — E785 Hyperlipidemia, unspecified: Secondary | ICD-10-CM

## 2016-10-07 DIAGNOSIS — I739 Peripheral vascular disease, unspecified: Secondary | ICD-10-CM

## 2016-10-07 DIAGNOSIS — I10 Essential (primary) hypertension: Secondary | ICD-10-CM | POA: Diagnosis not present

## 2016-10-07 DIAGNOSIS — I255 Ischemic cardiomyopathy: Secondary | ICD-10-CM

## 2016-10-07 DIAGNOSIS — I639 Cerebral infarction, unspecified: Secondary | ICD-10-CM | POA: Diagnosis not present

## 2016-10-07 MED ORDER — ATORVASTATIN CALCIUM 80 MG PO TABS: 80.0000 mg | ORAL_TABLET | Freq: Every day | ORAL | 3 refills | Status: DC

## 2016-10-07 NOTE — Progress Notes (Signed)
SUBJECTIVE: 66 yr old male with a h/o CVA in 06/2016, left CEA in 07/2016, cardiomyopathy (LVEF 45-50% with basal inferoseptal akinesis, mild aortic regurgitation, grade 1 diastolic dysfunction), with abnormal nuclear stress test on 07/03/16 demonstrating inferior infarct and no large ischemic zones.   This is my first time meeting him.  The patient denies any symptoms of chest pain, palpitations, shortness of breath, lightheadedness, dizziness, leg swelling, orthopnea, PND, and syncope.  Quit smoking 12 yrs ago. Never hospitalized for COPD.  Lipids 09/17/16: LDL 92 (down from 161), TC 154 (down from 220), TG 64, LDL 49 (up from 40).  He walks more than one mile daily.  He was told by neurology on 08/11/16 to stop ASA on 09/24/16 and to continue Plavix.   Soc Hx: Retired Curator.    Review of Systems: As per "subjective", otherwise negative.  Allergies  Allergen Reactions  . No Known Allergies     Current Outpatient Prescriptions  Medication Sig Dispense Refill  . aspirin 325 MG tablet Take 1 tablet (325 mg total) by mouth daily. 30 tablet 3  . atorvastatin (LIPITOR) 40 MG tablet Take 1 tablet (40 mg total) by mouth daily at 6 PM. 90 tablet 1  . carvedilol (COREG) 3.125 MG tablet Take 1 tablet (3.125 mg total) by mouth 2 (two) times daily with a meal. 180 tablet 1  . clopidogrel (PLAVIX) 75 MG tablet Take 1 tablet (75 mg total) by mouth daily. 90 tablet 3  . lisinopril (PRINIVIL,ZESTRIL) 5 MG tablet Take 1 tablet (5 mg total) by mouth daily. 90 tablet 1   No current facility-administered medications for this visit.     Past Medical History:  Diagnosis Date  . CVA (cerebral vascular accident) ([place]) 06/21/2016  . Hyperlipidemia   . Hypertension   . Myocardial infarction ([place])   . Substance abuse     Past Surgical History:  Procedure Laterality Date  . CAROTID ENDARTERECTOMY Left   . [place]   growth removed  . ENDARTERECTOMY Left 08/01/2016   Procedure:  ENDARTERECTOMY LEFT CAROTID;  Surgeon: [name], MD;  Location: [place];  Service: Vascular;  Laterality: Left;  . PATCH ANGIOPLASTY Left 08/01/2016   Procedure: PATCH ANGIOPLASTY LEFT CAROTID ARTERY USING ZENOSURE BIOLOGIC PATCH;  Surgeon: [name], MD;  Location: [place];  Service: Vascular;  Laterality: Left;  . TONSILLECTOMY      Social History   Social History  . Marital status: Divorced    Spouse name: N/A  . Number of children: 2  . Years of education: 12   Occupational History  . disabled     alcoholic   Social History Main Topics  . Smoking status: Former Smoker    Packs/day: 0.50    Years: 29.00    Quit date: 2006  . Smokeless tobacco: Never Used  . Alcohol use No     Comment: quit 2006  . Drug use: No     Comment: remote h/o heavy marijuana use, also used cocaine and others  . Sexual activity: Not Currently   Other Topics Concern  . Not on file   Social History Narrative   Divorced   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army for 8 years, EMT certified      Lives alone   Likes to walk      2 sons -in [place] and [place]:   10/07/16 0823  BP: 136/84  Pulse: 69  SpO2: 96%  Weight: 212 lb (96.2 kg)  Height: 6\' 2"  (1.88 m)    Wt Readings from Last 3 Encounters:  10/07/16 212 lb (96.2 kg)  08/28/16 208 lb (94.3 kg)  08/11/16 210 lb 12.8 oz (95.6 kg)     PHYSICAL EXAM General: NAD HEENT: Normal. Neck: No JVD, no thyromegaly. Lungs: Clear to auscultation bilaterally with normal respiratory effort. CV: Nondisplaced PMI.  Regular rate and rhythm, normal S1/S2, no S3/S4, no murmur. No pretibial or periankle edema.  No carotid bruit.   Abdomen: Soft, nontender, no distention.  Neurologic: Alert and oriented.  Psych: Normal affect. Skin: Normal. Musculoskeletal: No gross deformities.    ECG: Most recent ECG reviewed.   Labs: Lab Results  Component Value Date/Time   K 4.8 09/17/2016 07:20 AM   BUN 17 09/17/2016 07:20 AM    CREATININE 1.17 09/17/2016 07:20 AM   ALT 17 09/17/2016 07:20 AM   TSH 3.086 06/24/2016 10:49 PM   HGB 12.7 (L) 09/17/2016 07:20 AM     Lipids: Lab Results  Component Value Date/Time   LDLCALC 92 09/17/2016 07:20 AM   CHOL 154 09/17/2016 07:20 AM   TRIG 64 09/17/2016 07:20 AM   HDL 49 09/17/2016 07:20 AM       ASSESSMENT AND PLAN: 1. CAD: Symptomatically stable. He was told by neurology on 08/11/16 to stop ASA on 09/24/16 and to continue Plavix. He is still taking both ASA 325 mg and Plavix. I will stop ASA as per neurology's recommendations. I will increase Lipitor to 80 mg. Continue Coreg and lisinopril.  2. HTN: Controlled. No changes.  3. HLD: Lipids reviewed above. Will aim to reduce LDL further by increasing Lipitor to 80 mg.  4. CVA: He was told by neurology on 08/11/16 to stop ASA on 09/24/16 and to continue Plavix. He is still taking both ASA 325 mg and Plavix. I will stop ASA as per neurology's recommendations.  5. PVD: Continue antiplatelet therapy and statin.    Disposition: Follow up 6 months.  Time spent: 40 minutes, of which greater than 50% was spent reviewing symptoms, relevant blood tests and studies, and discussing management plan with the patient.    Kate Sable, M.D., F.A.C.C.

## 2016-10-07 NOTE — Patient Instructions (Signed)
Your physician wants you to follow-up in: 6 months with Dr Virgina Jock will receive a reminder letter in the mail two months in advance. If you don't receive a letter, please call our office to schedule the follow-up appointment.      STOP Aspirin   INCREASE Lipitor to 80 mg daily    If you need a refill on your cardiac medications before your next appointment, please call your pharmacy.       Thank you for choosing [facility] !

## 2016-10-14 ENCOUNTER — Encounter: Payer: Self-pay | Admitting: Family Medicine

## 2016-10-14 ENCOUNTER — Ambulatory Visit (INDEPENDENT_AMBULATORY_CARE_PROVIDER_SITE_OTHER): Payer: Medicare HMO | Admitting: Family Medicine

## 2016-10-14 VITALS — BP 150/92 | HR 68 | Temp 96.9°F | Resp 18 | Ht 74.0 in | Wt 210.1 lb

## 2016-10-14 DIAGNOSIS — I10 Essential (primary) hypertension: Secondary | ICD-10-CM

## 2016-10-14 DIAGNOSIS — E785 Hyperlipidemia, unspecified: Secondary | ICD-10-CM | POA: Diagnosis not present

## 2016-10-14 DIAGNOSIS — I2510 Atherosclerotic heart disease of native coronary artery without angina pectoris: Secondary | ICD-10-CM

## 2016-10-14 DIAGNOSIS — R7303 Prediabetes: Secondary | ICD-10-CM | POA: Diagnosis not present

## 2016-10-14 DIAGNOSIS — E559 Vitamin D deficiency, unspecified: Secondary | ICD-10-CM | POA: Diagnosis not present

## 2016-10-14 DIAGNOSIS — Z9889 Other specified postprocedural states: Secondary | ICD-10-CM | POA: Diagnosis not present

## 2016-10-14 MED ORDER — LISINOPRIL 10 MG PO TABS: 10.0000 mg | ORAL_TABLET | Freq: Every day | ORAL | 3 refills | Status: DC

## 2016-10-14 NOTE — Progress Notes (Signed)
Chief Complaint  Patient presents with  . Follow-up    3 month  he had a carotid endarterectomy and recovered well. BP not well controlled.  Discussed increasing the lisinopril and he agrees He takes  His BP at home and it has been up. He refuses flu shot and prevnar today.  Reasons for immunization and safety reviewed,  Shots are reccommended. He is on lipitor 80.  Last LDL is 94. Is walking daily.  Is encouraged to continue Denies chest pain or DOE.  No calf pain. Recent cardiology note reviewed with patient Recent neck MRI reviewed as well as he expresses concern about finding of DJD cervical spine.  Minimal pain and no limitation.  I explained it is not unusual for his age. Recent labs reviewed.  A1c is up.  Lipids are OK.  Vitamin D low.  Patient Active Problem List   Diagnosis Date Noted  . Vitamin D deficiency 10/14/2016  . History of carotid endarterectomy 10/14/2016  . Left carotid artery stenosis 08/01/2016  . History of MI (myocardial infarction) 07/08/2016  . CAD in native artery 07/08/2016  . Pre-diabetes 07/08/2016  . History of substance abuse 07/08/2016  . Essential hypertension 07/07/2016  . Dyslipidemia 07/07/2016  . Dizziness   . Stenosis of left carotid artery   . Cardiomyopathy ([place])   . 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 atorvastatin (LIPITOR) 80 MG tablet Take 1 tablet (80 mg total) by mouth daily.  . carvedilol (COREG) 3.125 MG tablet Take 1 tablet (3.125 mg total) by mouth 2 (two) times daily with a meal.  . clopidogrel (PLAVIX) 75 MG tablet Take 1 tablet (75 mg total) by mouth daily.  [name] lisinopril (PRINIVIL,ZESTRIL) 10 MG tablet Take 1 tablet (10 mg total) by mouth daily.  . [DISCONTINUED] lisinopril (PRINIVIL,ZESTRIL) 5 MG tablet Take 1 tablet (5 mg total) by mouth daily.   No facility-administered encounter medications on file as of 10/14/2016.     Allergies  Allergen  Reactions  . No Known Allergies     Review of Systems  Constitutional: Negative for activity change, appetite change and unexpected weight change.  HENT: Negative for congestion and dental problem.   Eyes: Negative for photophobia and visual disturbance.  Respiratory: Negative for cough and shortness of breath.   Cardiovascular: Negative for chest pain, palpitations and leg swelling.  Gastrointestinal: Negative for abdominal distention, abdominal pain, constipation and diarrhea.  Genitourinary: Negative for difficulty urinating and frequency.  Musculoskeletal: Positive for neck pain. Negative for arthralgias and back pain.       Occasional left neck pain and popping  Neurological: Negative for dizziness and headaches.  Psychiatric/Behavioral: Negative for decreased concentration. The patient is not nervous/anxious.     BP (!) 150/92   Pulse 68   Temp (!) 96.9 F (36.1 C) (Temporal)   Resp 18   Ht 6\' 2"  (1.88 m)   Wt 210 lb 1.9 oz (95.3 kg)   SpO2 96%   BMI 26.98 kg/m   Physical Exam  Constitutional: He is oriented to person, place, and time. He appears well-developed and well-nourished.  HENT:  Head: Normocephalic and atraumatic.  Right Ear: External ear normal.  Left Ear: External ear normal.  Mouth/Throat: Oropharynx is clear and moist.  Plaque and periodontal disease  Eyes: Pupils are equal, round, and reactive to light. Conjunctivae are normal.  Neck: Normal range of motion. Neck supple.  Well healed neck  scar.  No bruit.  Neck FROM and nontender  Cardiovascular: Normal rate, regular rhythm and normal heart sounds.   Pulmonary/Chest: Effort normal. No respiratory distress.  Scattered rhonchi  Abdominal: Soft. Bowel sounds are normal.  Musculoskeletal: Normal range of motion. He exhibits no edema.  Lymphadenopathy:    He has no cervical adenopathy.  Neurological: He is alert and oriented to person, place, and time.  Gait normal  Skin: Skin is warm and dry.    Psychiatric: He has a normal mood and affect. Thought content normal.  Poor eye contact  Nursing note and vitals reviewed.   ASSESSMENT/PLAN:  1. Vitamin D deficiency Discussed replacement and diet changes  2. CAD in native artery Sees cardiology.  No symptoms  3. Pre-diabetes Discussed diet, sweets, carbohydrates  4. Essential hypertension Not controlled.  Increase th elisinopril  5. Dyslipidemia LDL 94.  Optimal for secondary prevention would be under 70  Discussed diet.  6. History of carotid endarterectomy Healed well   Patient Instructions  Increase the lisinopril to 10 mg a day Continue other medicines Walk every day that you are able I do recommend a flu shot  See me in 6 months Call sooner for problems   Raylene Everts, MD

## 2016-10-14 NOTE — Patient Instructions (Signed)
Increase the lisinopril to 10 mg a day Continue other medicines Walk every day that you are able I do recommend a flu shot  See me in 6 months Call sooner for problems

## 2016-10-24 ENCOUNTER — Telehealth: Payer: Self-pay | Admitting: Family Medicine

## 2016-10-24 NOTE — Telephone Encounter (Signed)
Called [facility], pt has refills, [place] aware.

## 2016-10-24 NOTE — Telephone Encounter (Signed)
Patient is requesting a refill for clopidogrel 75mg ,  [facility]   Cb#: [phone]

## 2017-03-04 ENCOUNTER — Ambulatory Visit (HOSPITAL_COMMUNITY)
Admission: RE | Admit: 2017-03-04 | Discharge: 2017-03-04 | Disposition: A | Discharge status: Home / Self Care | Payer: Medicare HMO | Source: Ambulatory Visit | Attending: Vascular Surgery | Admitting: Vascular Surgery

## 2017-03-04 ENCOUNTER — Ambulatory Visit: Payer: Medicare HMO | Admitting: Vascular Surgery

## 2017-03-04 ENCOUNTER — Encounter: Payer: Self-pay | Admitting: Vascular Surgery

## 2017-03-04 VITALS — BP 142/85 | HR 66 | Temp 98.5°F | Resp 16 | Ht 74.0 in | Wt 215.0 lb

## 2017-03-04 DIAGNOSIS — Z9889 Other specified postprocedural states: Secondary | ICD-10-CM | POA: Diagnosis not present

## 2017-03-04 DIAGNOSIS — I6521 Occlusion and stenosis of right carotid artery: Secondary | ICD-10-CM | POA: Insufficient documentation

## 2017-03-04 DIAGNOSIS — I6522 Occlusion and stenosis of left carotid artery: Secondary | ICD-10-CM

## 2017-03-04 DIAGNOSIS — Z48812 Encounter for surgical aftercare following surgery on the circulatory system: Secondary | ICD-10-CM | POA: Diagnosis not present

## 2017-03-04 LAB — VAS US CAROTID
LCCAPSYS: 98 cm/s
LEFT ECA DIAS: -9 cm/s
LEFT VERTEBRAL DIAS: -23 cm/s
LICAPDIAS: -33 cm/s
LICAPSYS: -72 cm/s
Left CCA dist dias: 19 cm/s
Left CCA dist sys: 64 cm/s
Left CCA prox dias: 28 cm/s
Left ICA dist dias: -38 cm/s
Left ICA dist sys: -82 cm/s
RCCAPDIAS: 20 cm/s
RCCAPSYS: 92 cm/s
RIGHT CCA MID DIAS: 18 cm/s
RIGHT ECA DIAS: -9 cm/s
RIGHT VERTEBRAL DIAS: -1 cm/s
Right cca dist sys: -72 cm/s

## 2017-03-04 NOTE — Progress Notes (Signed)
HISTORY AND PHYSICAL     CC:  Follow up after carotid surgery Requesting Provider:  Raylene Everts, MD  HPI: This is a 67 y.o. male who was admitted in May with a right cerebellar and posterior circulation infarct. He was found to have a 70-80% left carotid stenosis and duplex suggested a greater than 80% stenosis. Left carotid endarterectomy was recommended in order to lower his risk of future stroke. On 08/01/2016, he underwent a left carotid endarterectomy with bovine pericardial patch angioplasty.  He states that he has done well since we saw him in June.  He denies any weakness/numbness/clumsiness in his extremities.  He denies any amaurosis fugax or speech difficulty.  He has not had any chest pain or pain in his legs.   The pt is on a beta blocker and ACEI for blood pressure control.  He takes Plavix daily.  The pt is on a statin for cholesterol management.   He quit smoking 11 years ago.  Past Medical History:  Diagnosis Date  . CVA (cerebral vascular accident) ([employer]) 06/21/2016  . Hyperlipidemia   . Hypertension   . Myocardial infarction ([place])   . Substance abuse [facility])     Past Surgical History:  Procedure Laterality Date  . CAROTID ENDARTERECTOMY Left   . [place]   growth removed  . ENDARTERECTOMY Left 08/01/2016   Procedure: ENDARTERECTOMY LEFT CAROTID;  Surgeon: [name], MD;  Location: [place];  Service: Vascular;  Laterality: Left;  . PATCH ANGIOPLASTY Left 08/01/2016   Procedure: PATCH ANGIOPLASTY LEFT CAROTID ARTERY USING ZENOSURE BIOLOGIC PATCH;  Surgeon: [name], MD;  Location: [place];  Service: Vascular;  Laterality: Left;  . TONSILLECTOMY      Allergies  Allergen Reactions  . No Known Allergies     Current Outpatient Medications  Medication Sig Dispense Refill  . carvedilol (COREG) 3.125 MG tablet Take 1 tablet (3.125 mg total) by mouth 2 (two) times daily with a meal. 180 tablet 1  . clopidogrel (PLAVIX) 75 MG  tablet Take 1 tablet (75 mg total) by mouth daily. 90 tablet 3  . lisinopril (PRINIVIL,ZESTRIL) 10 MG tablet Take 1 tablet (10 mg total) by mouth daily. 90 tablet 3  . atorvastatin (LIPITOR) 80 MG tablet Take 1 tablet (80 mg total) by mouth daily. 90 tablet 3   No current facility-administered medications for this visit.     Family History  Problem Relation Age of Onset  . Breast cancer Mother        She is 51 years old, in [place]  . CVA Mother   . CAD Mother   . Arthritis Mother   . Cancer Mother   . Hypertension Mother   . Other Father        gunshot wound  . Early death Father        GSW  . Cancer Brother   . Cancer Brother   . Stroke Maternal Aunt   . Alcohol abuse Maternal Uncle   . Stroke Maternal Grandfather     Social History   Socioeconomic History  . Marital status: Divorced    Spouse name: Not on file  . Number of children: 2  . Years of education: 1  . Highest education level: Not on file  Social Needs  . Financial resource strain: Not on file  . Food insecurity - worry: Not on file  . Food insecurity - inability: Not on file  . Transportation needs - medical:  Not on file  . Transportation needs - non-medical: Not on file  Occupational History  . Occupation: disabled    Comment: alcoholic  Tobacco Use  . Smoking status: Former Smoker    Packs/day: 0.50    Years: 29.00    Pack years: 14.50    Last attempt to quit: 2006    Years since quitting: 13.0  . Smokeless tobacco: Never Used  Substance and Sexual Activity  . Alcohol use: No    Comment: quit 2006  . Drug use: No    Comment: remote h/o heavy marijuana use, also used cocaine and others  . Sexual activity: Not Currently  Other Topics Concern  . Not on file  Social History Narrative   Divorced   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army for 8 years, EMT certified      Lives alone   Likes to walk      2 sons -in [place] and [place]:   [X]  denotes positive finding, [ ]  denotes negative  finding Cardiac  Comments:  Chest pain or chest pressure:    Shortness of breath upon exertion:    Short of breath when lying flat:    Irregular heart rhythm:        Vascular    Pain in calf, thigh, or hip brought on by ambulation:    Pain in feet at night that wakes you up from your sleep:     Blood clot in your veins:    Leg swelling:         Pulmonary    Oxygen at home:    Productive cough:     Wheezing:         Neurologic    Sudden weakness in arms or legs:     Sudden numbness in arms or legs:     Sudden onset of difficulty speaking or slurred speech:    Temporary loss of vision in one eye:     Problems with dizziness:         Gastrointestinal    Blood in stool:     Vomited blood:         Genitourinary    Burning when urinating:     Blood in urine:        Psychiatric    Major depression:         Hematologic    Bleeding problems:    Problems with blood clotting too easily:        Skin    Rashes or ulcers:        Constitutional    Fever or chills:      PHYSICAL EXAMINATION:  Vitals:   03/04/17 1302  BP: (!) 154/88  Pulse: 66  Resp: 16  Temp: 98.5 F (36.9 C)  SpO2: 97%   Body mass index is 27.6 kg/m.  General:  WDWN in NAD; vital signs documented above Gait: Not observed HENT: WNL, normocephalic Pulmonary: normal non-labored breathing , without Rales, rhonchi,  wheezing Cardiac: regular HR, without  Murmurs, rubs or gallops; without carotid bruits Abdomen: soft, NT, no masses Skin: without rashes; well healed left neck scar Vascular Exam/Pulses:  Right Left  Radial 2+ (normal) 2+ (normal)  Ulnar 1+ (weak) 1+ (weak)  DP 1+ (weak) 1+ (weak)  PT Unable to palpate  Unable to palpate   Extremities: without ischemic changes, without Gangrene , without cellulitis; without open wounds;  Musculoskeletal: no muscle wasting or atrophy  Neurologic: A&O X 3;  No focal weakness or paresthesias are detected Psychiatric:  The pt has flat  affect.   Non-Invasive Vascular Imaging:   Carotid Duplex on 03/04/17: 1.  1-39% right ICA stenosis 2.  Patent left endarterectomy site with no evidence for restenosis  3.  multiphasic subclavian arteries  Pt meds includes: Statin:  Yes.   Beta Blocker:  Yes.   Aspirin:  No. ACEI:  Yes.   ARB:  No. CCB use:  No Other Antiplatelet/Anticoagulant:  No   ASSESSMENT/PLAN:: 67 y.o. male who is s/p left carotid artery stenosis here for 6 month follow up   -pt has done well since surgery and has been asymptomatic.  He is not on aspirin, but does take plavix daily.  Continue statin. -his carotid duplex today reveals that he has a left patent left carotid endarterectomy site and the right side is 1-39%.  We will see him back in 6 months and if no change, then yearly.  He will contact us if he has any issues.    Leontine Locket, PA-C [facility] [phone]  Clinic MD:  Pt seen and examined with Dr. Scot Dock

## 2017-03-09 ENCOUNTER — Telehealth: Payer: Self-pay | Admitting: Family Medicine

## 2017-03-09 MED ORDER — CARVEDILOL 3.125 MG PO TABS: 3.1250 mg | ORAL_TABLET | Freq: Two times a day (BID) | ORAL | 1 refills | Status: DC

## 2017-03-09 NOTE — Telephone Encounter (Signed)
Patient requesting carvedilol refill [facility] in [place] Cb#: [phone]

## 2017-03-10 NOTE — Addendum Note (Signed)
Addended by: Lianne Cure A on: 03/10/2017 11:12 AM   Modules accepted: Orders

## 2017-04-16 ENCOUNTER — Encounter: Payer: Self-pay | Admitting: Family Medicine

## 2017-04-16 ENCOUNTER — Other Ambulatory Visit: Payer: Self-pay

## 2017-04-16 ENCOUNTER — Ambulatory Visit (INDEPENDENT_AMBULATORY_CARE_PROVIDER_SITE_OTHER): Payer: Medicare HMO | Admitting: Family Medicine

## 2017-04-16 VITALS — BP 132/82 | HR 72 | Temp 97.7°F | Resp 16 | Ht 74.0 in | Wt 219.1 lb

## 2017-04-16 DIAGNOSIS — I2510 Atherosclerotic heart disease of native coronary artery without angina pectoris: Secondary | ICD-10-CM | POA: Diagnosis not present

## 2017-04-16 DIAGNOSIS — Z1211 Encounter for screening for malignant neoplasm of colon: Secondary | ICD-10-CM

## 2017-04-16 DIAGNOSIS — R399 Unspecified symptoms and signs involving the genitourinary system: Secondary | ICD-10-CM | POA: Diagnosis not present

## 2017-04-16 DIAGNOSIS — Z87891 Personal history of nicotine dependence: Secondary | ICD-10-CM

## 2017-04-16 DIAGNOSIS — R7303 Prediabetes: Secondary | ICD-10-CM

## 2017-04-16 DIAGNOSIS — I10 Essential (primary) hypertension: Secondary | ICD-10-CM

## 2017-04-16 DIAGNOSIS — Z23 Encounter for immunization: Secondary | ICD-10-CM | POA: Diagnosis not present

## 2017-04-16 DIAGNOSIS — E559 Vitamin D deficiency, unspecified: Secondary | ICD-10-CM

## 2017-04-16 NOTE — Progress Notes (Signed)
Chief Complaint  Patient presents with  . Follow-up    6 months   Patient is here for routine follow-up. He is due for health maintenance. He has never had an aorta screen for aneurysm.  Prior smoker.  Known cerebrovascular disease. He had a colon cancer screen at approximately age 67.  None since.  It was negative.  He does not want another colonoscopy.  He will agree to a cologuard.  He understands if his cologuard is positive that he would need a colonoscopy.  He is agreeable. He refuses all immunizations.  This is discussed with him.  He does not feel like he needs them.  He feels like they are going to make him sick.  I tried to educate him on the risks and benefits of vaccinations.  He chooses not to get shots. He is compliant with his Lipitor. He walks a mile and a half every day.  He asked if he can start some light weight lifting.  I told him I am certain that that would be a good idea. He has good blood pressure. History of vitamin D deficiency.  He is not taking vitamin D.  We will check his current status.  Patient Active Problem List   Diagnosis Date Noted  . Vitamin D deficiency 10/14/2016  . History of carotid endarterectomy 10/14/2016  . Left carotid artery stenosis 08/01/2016  . History of MI (myocardial infarction) 07/08/2016  . CAD in native artery 07/08/2016  . Pre-diabetes 07/08/2016  . History of substance abuse 07/08/2016  . Essential hypertension 07/07/2016  . Dyslipidemia 07/07/2016  . Dizziness   . Stenosis of left carotid artery   . Cardiomyopathy ([place])   . 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 atorvastatin (LIPITOR) 80 MG tablet Take 1 tablet (80 mg total) by mouth daily.  . carvedilol (COREG) 3.125 MG tablet Take 1 tablet (3.125 mg total) by mouth 2 (two) times daily with a meal.  . clopidogrel (PLAVIX) 75 MG tablet Take 1 tablet (75 mg total) by mouth daily. (Patient taking  differently: Take 80 mg by mouth daily. )  . lisinopril (PRINIVIL,ZESTRIL) 10 MG tablet Take 1 tablet (10 mg total) by mouth daily.   No facility-administered encounter medications on file as of 04/16/2017.     Allergies  Allergen Reactions  . No Known Allergies     Review of Systems  Constitutional: Negative for activity change, appetite change and unexpected weight change.  HENT: Negative for congestion and dental problem.   Eyes: Negative for photophobia and visual disturbance.  Respiratory: Negative for cough and shortness of breath.   Cardiovascular: Negative for chest pain, palpitations and leg swelling.  Gastrointestinal: Negative for abdominal distention, abdominal pain, constipation and diarrhea.  Genitourinary: Positive for difficulty urinating. Negative for frequency.       Some nocturia, occasional difficulty emptying bladder  Musculoskeletal: Positive for neck pain. Negative for arthralgias, back pain and gait problem.  Neurological: Negative for dizziness, weakness and headaches.  Psychiatric/Behavioral: Negative for decreased concentration. The patient is not nervous/anxious.     BP 132/82 (BP Location: Left Arm, Patient Position: Sitting, Cuff Size: Large)   Pulse 72   Temp 97.7 F (36.5 C) (Temporal)   Resp 16   Ht 6\' 2"  (1.88 m)   Wt 219 lb 1.9 oz (99.4 kg)   SpO2 96%   BMI 28.13 kg/m   Physical Exam  Constitutional: He is  oriented to person, place, and time. He appears well-developed and well-nourished.  HENT:  Head: Normocephalic and atraumatic.  Right Ear: External ear normal.  Left Ear: External ear normal.  Mouth/Throat: Oropharynx is clear and moist.  Plaque and periodontal disease  Eyes: Conjunctivae are normal. Pupils are equal, round, and reactive to light.  Neck: Normal range of motion. Neck supple.  Well healed neck scar.  No bruit.  Neck FROM and nontender  Cardiovascular: Normal rate, regular rhythm and normal heart sounds.    Pulmonary/Chest: Effort normal. No respiratory distress.  Scattered rhonchi  Abdominal: Soft. Bowel sounds are normal.  Musculoskeletal: Normal range of motion. He exhibits no edema.  Lymphadenopathy:    He has no cervical adenopathy.  Neurological: He is alert and oriented to person, place, and time.  Gait normal  Skin: Skin is warm and dry.  Psychiatric: He has a normal mood and affect. Thought content normal.  Poor eye contact  Nursing note and vitals reviewed.   ASSESSMENT/PLAN:  1. Need for influenza vaccination Discussed.  Declined - Flu Vaccine QUAD 36+ mos IM  2. Vitamin D deficiency Not on replacement  3. CAD in native artery No chest pain or DOE - Lipid panel  4. Essential hypertension Well-controlled  5. History of prior cigarette smoking Discussed recommendations - VAS [place] AAA DUPLEX; Future  6. Screen for colon cancer Discussed need for colonoscopy if cologuard is positive - Cologuard  7. Pre-diabetes Discussed importance of diet and exercise - COMPLETE METABOLIC PANEL WITH GFR - CBC - Hemoglobin A1c - Urinalysis, Routine w reflex microscopic  8. Lower urinary tract symptoms (LUTS) Discussed the choice of whether or not to get a PSA.  PSA is very accurate, but leads to increased instrumentation.  Patient is interested in a PSA.  No prostate cancer in his family.  He does admit to some urinary symptoms. - PSA Greater than 50% of this visit was spent in counseling and coordinating care.  Total face to face time:   40 minutes spent discussing issues addressed at the HPI   Patient Instructions  Need colon cancer test I will order a cologuard Need to check the aorta in your abdomen I have ordered an ultrasound Watch the sweets and carbohydrates in your diet ( bread and pasta ) Continue your good exercise habits  Need blood work and follow up in 6 months  You will see Dr Mannie Stabile in follow up     Raylene Everts, MD

## 2017-04-16 NOTE — Patient Instructions (Addendum)
Need colon cancer test I will order a cologuard Need to check the aorta in your abdomen I have ordered an ultrasound Watch the sweets and carbohydrates in your diet ( bread and pasta ) Continue your good exercise habits  Need blood work and follow up in 6 months  You will see Dr Mannie Stabile in follow up

## 2017-04-17 ENCOUNTER — Encounter: Payer: Self-pay | Admitting: Cardiovascular Disease

## 2017-04-17 ENCOUNTER — Ambulatory Visit: Payer: Medicare HMO | Admitting: Cardiovascular Disease

## 2017-04-17 VITALS — BP 138/78 | HR 66 | Ht 75.0 in | Wt 221.0 lb

## 2017-04-17 DIAGNOSIS — I6522 Occlusion and stenosis of left carotid artery: Secondary | ICD-10-CM | POA: Diagnosis not present

## 2017-04-17 DIAGNOSIS — I255 Ischemic cardiomyopathy: Secondary | ICD-10-CM

## 2017-04-17 DIAGNOSIS — I739 Peripheral vascular disease, unspecified: Secondary | ICD-10-CM

## 2017-04-17 DIAGNOSIS — I25118 Atherosclerotic heart disease of native coronary artery with other forms of angina pectoris: Secondary | ICD-10-CM

## 2017-04-17 DIAGNOSIS — I639 Cerebral infarction, unspecified: Secondary | ICD-10-CM | POA: Diagnosis not present

## 2017-04-17 DIAGNOSIS — I10 Essential (primary) hypertension: Secondary | ICD-10-CM

## 2017-04-17 DIAGNOSIS — E785 Hyperlipidemia, unspecified: Secondary | ICD-10-CM | POA: Diagnosis not present

## 2017-04-17 MED ORDER — ASPIRIN EC 81 MG PO TBEC: 81.0000 mg | DELAYED_RELEASE_TABLET | Freq: Every day | ORAL | 3 refills | Status: AC

## 2017-04-17 NOTE — Patient Instructions (Signed)
Medication Instructions:  START ASPIRIN 81 MG DAILY   Labwork: FASTING LIPIDS   Testing/Procedures: NONE  Follow-Up: Your physician wants you to follow-up in: 1 YEAR.  You will receive a reminder letter in the mail two months in advance. If you don't receive a letter, please call our office to schedule the follow-up appointment.   Any Other Special Instructions Will Be Listed Below (If Applicable).     If you need a refill on your cardiac medications before your next appointment, please call your pharmacy.

## 2017-04-17 NOTE — Progress Notes (Signed)
SUBJECTIVE: The patient presents for routine follow-up.  He has a history of CVA in 06/2016, left CEA in 07/2016, cardiomyopathy (LVEF 45-50% with basal inferoseptal akinesis, mild aortic regurgitation, grade 1 diastolic dysfunction), with abnormal nuclear stress test on 07/03/16 demonstrating inferior infarct and no large ischemic zones.  He had been on Plavix as prescribed by neurology at his last visit with me in August 2018 but is now no longer on either aspirin or Plavix.  The patient denies any symptoms of chest pain, palpitations, shortness of breath, lightheadedness, dizziness, leg swelling, orthopnea, PND, and syncope.  He walks a mile and a half on most days of the week.  He would like to start lifting light weights.  He has questions about supplemental testosterone.    Soc Hx: Retired Curator.     Review of Systems: As per "subjective", otherwise negative.  Allergies  Allergen Reactions  . No Known Allergies     Current Outpatient Medications  Medication Sig Dispense Refill  . atorvastatin (LIPITOR) 80 MG tablet Take 1 tablet (80 mg total) by mouth daily. 90 tablet 3  . carvedilol (COREG) 3.125 MG tablet Take 1 tablet (3.125 mg total) by mouth 2 (two) times daily with a meal. 180 tablet 1  . lisinopril (PRINIVIL,ZESTRIL) 10 MG tablet Take 10 mg by mouth daily.     No current facility-administered medications for this visit.     Past Medical History:  Diagnosis Date  . CVA (cerebral vascular accident) ([place]) 06/21/2016  . Hyperlipidemia   . Hypertension   . Myocardial infarction ([place])   . Substance abuse [facility])     Past Surgical History:  Procedure Laterality Date  . CAROTID ENDARTERECTOMY Left   . [place]   growth removed  . ENDARTERECTOMY Left 08/01/2016   Procedure: ENDARTERECTOMY LEFT CAROTID;  Surgeon: [name], MD;  Location: [place];  Service: Vascular;  Laterality: Left;  . PATCH ANGIOPLASTY Left 08/01/2016   Procedure:  PATCH ANGIOPLASTY LEFT CAROTID ARTERY USING ZENOSURE BIOLOGIC PATCH;  Surgeon: [name], MD;  Location: [place];  Service: Vascular;  Laterality: Left;  . TONSILLECTOMY      Social History   Socioeconomic History  . Marital status: Divorced    Spouse name: Not on file  . Number of children: 2  . Years of education: 49  . Highest education level: Not on file  Social Needs  . Financial resource strain: Not on file  . Food insecurity - worry: Not on file  . Food insecurity - inability: Not on file  . Transportation needs - medical: Not on file  . Transportation needs - non-medical: Not on file  Occupational History  . Occupation: disabled    Comment: alcoholic  Tobacco Use  . Smoking status: Former Smoker    Packs/day: 0.50    Years: 29.00    Pack years: 14.50    Last attempt to quit: 2006    Years since quitting: 13.1  . Smokeless tobacco: Never Used  Substance and Sexual Activity  . Alcohol use: No    Comment: quit 2006  . Drug use: No    Comment: remote h/o heavy marijuana use, also used cocaine and others  . Sexual activity: Not Currently  Other Topics Concern  . Not on file  Social History Narrative   Divorced   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army for 8 years, EMT certified      Lives alone   Likes to walk  2 sons -in [place]:   04/17/17 1307  BP: 138/78  Pulse: 66  SpO2: 97%  Weight: 221 lb (100.2 kg)  Height: 6\' 3"  (1.905 m)    Wt Readings from Last 3 Encounters:  04/17/17 221 lb (100.2 kg)  04/16/17 219 lb 1.9 oz (99.4 kg)  03/04/17 215 lb (97.5 kg)     PHYSICAL EXAM General: NAD HEENT: Normal. Neck: No JVD, no thyromegaly. Lungs: Clear to auscultation bilaterally with normal respiratory effort. CV: Regular rate and rhythm, normal S1/S2, no E0/P2, soft systolic murmur over right upper sternal border. No pretibial or periankle edema.  No carotid bruit.   Abdomen: Soft, nontender, no distention.  Neurologic: Alert and  oriented.  Psych: Normal affect. Skin: Normal. Musculoskeletal: No gross deformities.    ECG: Most recent ECG reviewed.   Labs: Lab Results  Component Value Date/Time   K 4.8 09/17/2016 07:20 AM   BUN 17 09/17/2016 07:20 AM   CREATININE 1.17 09/17/2016 07:20 AM   ALT 17 09/17/2016 07:20 AM   TSH 3.086 06/24/2016 10:49 PM   HGB 12.7 (L) 09/17/2016 07:20 AM     Lipids: Lab Results  Component Value Date/Time   LDLCALC 92 09/17/2016 07:20 AM   CHOL 154 09/17/2016 07:20 AM   TRIG 64 09/17/2016 07:20 AM   HDL 49 09/17/2016 07:20 AM       ASSESSMENT AND PLAN:  1.  Coronary artery disease with ischemic cardiomyopathy: Symptomatically stable.  I told him to start taking aspirin 81 mg daily as he is not on any form of antiplatelet therapy and requires it given both presumed coronary artery disease as well as history of CVA and peripheral vascular disease.  Continue carvedilol, Lipitor, and lisinopril.  I will obtain a lipid panel to see if LDL is at goal.  2.  Hypertension: Controlled on present therapy which includes carvedilol and lisinopril.  No changes.  3.  Hyperlipidemia: I will obtain a lipid panel to see if LDL is at goal (less than 70).  Continue Lipitor 80 mg.  4. CVA: I told him to start taking aspirin 81 mg daily as he is not on any form of antiplatelet therapy and requires it given both presumed coronary artery disease as well as history of CVA and peripheral vascular disease.  Continue Lipitor 80 mg.  5.  Peripheral vascular disease: Continue Lipitor 80 mg.  I instructed him to start taking aspirin 81 mg daily.     Disposition: Follow up 1 year.   Kate Sable, M.D., F.A.C.C.

## 2017-04-22 ENCOUNTER — Other Ambulatory Visit: Payer: Self-pay | Admitting: Cardiovascular Disease

## 2017-04-27 ENCOUNTER — Encounter: Payer: Self-pay | Admitting: Family Medicine

## 2017-04-29 ENCOUNTER — Other Ambulatory Visit: Payer: Self-pay

## 2017-04-29 ENCOUNTER — Telehealth: Payer: Self-pay | Admitting: Family Medicine

## 2017-04-29 MED ORDER — CARVEDILOL 3.125 MG PO TABS: 3.1250 mg | ORAL_TABLET | Freq: Two times a day (BID) | ORAL | 0 refills | Status: DC

## 2017-04-29 NOTE — Telephone Encounter (Signed)
Patient states he lost is carvedilol, he is requesting a new prescription. Cb#: [phone] Pharmacy: [facility] [address]

## 2017-04-29 NOTE — Telephone Encounter (Signed)
Submitted in for a 90 day supply

## 2017-04-30 DIAGNOSIS — Z1211 Encounter for screening for malignant neoplasm of colon: Secondary | ICD-10-CM | POA: Diagnosis not present

## 2017-04-30 DIAGNOSIS — Z1212 Encounter for screening for malignant neoplasm of rectum: Secondary | ICD-10-CM | POA: Diagnosis not present

## 2017-05-09 LAB — COLOGUARD: COLOGUARD: POSITIVE

## 2017-05-12 ENCOUNTER — Other Ambulatory Visit: Payer: Self-pay

## 2017-05-12 ENCOUNTER — Telehealth: Payer: Self-pay

## 2017-05-12 DIAGNOSIS — Z1211 Encounter for screening for malignant neoplasm of colon: Secondary | ICD-10-CM

## 2017-05-12 NOTE — Telephone Encounter (Signed)
Left message asking patient to call back for recent testing results.

## 2017-05-12 NOTE — Progress Notes (Signed)
ref

## 2017-05-13 ENCOUNTER — Telehealth: Payer: Self-pay | Admitting: Family Medicine

## 2017-05-13 NOTE — Telephone Encounter (Signed)
Returned patients call regarding recent cologuard results. Asked patient to call back for results.

## 2017-05-13 NOTE — Telephone Encounter (Signed)
Please call the pt with test results from Cologuard

## 2017-05-14 ENCOUNTER — Encounter: Payer: Self-pay | Admitting: Family Medicine

## 2017-05-21 ENCOUNTER — Encounter (INDEPENDENT_AMBULATORY_CARE_PROVIDER_SITE_OTHER): Payer: Self-pay | Admitting: Internal Medicine

## 2017-05-21 ENCOUNTER — Ambulatory Visit: Payer: Medicare HMO | Admitting: Family Medicine

## 2017-05-21 ENCOUNTER — Ambulatory Visit (INDEPENDENT_AMBULATORY_CARE_PROVIDER_SITE_OTHER): Payer: Medicare HMO | Admitting: Internal Medicine

## 2017-05-21 VITALS — BP 180/90 | HR 60 | Temp 98.0°F | Ht 74.0 in | Wt 219.3 lb

## 2017-05-21 DIAGNOSIS — R195 Other fecal abnormalities: Secondary | ICD-10-CM | POA: Diagnosis not present

## 2017-05-21 NOTE — Patient Instructions (Signed)
The risks of bleeding, perforation and infection were reviewed with patient.  

## 2017-05-21 NOTE — Progress Notes (Signed)
   Subjective:    Patient ID: Albert Morris, male    DOB: 15-Jan-1951, 67 y.o.   MRN: [id number]  HPI Referred by Dr. Meda Coffee for positive Cologuard. His last colonoscopy was at age 61yrs in [place] and was normal per patient. He has not seen any change in his stools. No melena or BRRB. He ha a BM daily or twice a day. Appetite is good. No weight loss. No abdominal pain. No GI complaints.    Hx of CVA and MI and maintained on Plavix and ASA. Hx of left carotid endarterectomy.  Hx of colon surgery when he was 3 yrs for a growth in the 32s.     Review of Systems  Past Medical History:  Diagnosis Date  . CVA (cerebral vascular accident) ([place]) 06/21/2016  . Hyperlipidemia   . Hypertension   . Myocardial infarction ([place])   . Substance abuse [facility])     Past Surgical History:  Procedure Laterality Date  . CAROTID ENDARTERECTOMY Left   . [place]   growth removed  . ENDARTERECTOMY Left 08/01/2016   Procedure: ENDARTERECTOMY LEFT CAROTID;  Surgeon: [name], MD;  Location: [place];  Service: Vascular;  Laterality: Left;  . PATCH ANGIOPLASTY Left 08/01/2016   Procedure: PATCH ANGIOPLASTY LEFT CAROTID ARTERY USING ZENOSURE BIOLOGIC PATCH;  Surgeon: [name], MD;  Location: [facility];  Service: Vascular;  Laterality: Left;  . TONSILLECTOMY      Allergies  Allergen Reactions  . No Known Allergies     Current Outpatient Medications on File Prior to Visit  Medication Sig Dispense Refill  . aspirin EC 81 MG tablet Take 1 tablet (81 mg total) by mouth daily. 90 tablet 3  . atorvastatin (LIPITOR) 80 MG tablet TAKE 1 TABLET BY MOUTH EVERY DAY 90 tablet 0  . carvedilol (COREG) 3.125 MG tablet Take 1 tablet (3.125 mg total) by mouth 2 (two) times daily with a meal. 180 tablet 0  . clopidogrel (PLAVIX) 75 MG tablet Take 75 mg by mouth daily.    [name] lisinopril (PRINIVIL,ZESTRIL) 10 MG tablet Take 10 mg by mouth daily.     No current  facility-administered medications on file prior to visit.         Objective:   Physical Exam Blood pressure (!) 180/90, pulse 60, temperature 98 F (36.7 C), height 6\' 2"  (1.88 m), weight 219 lb 4.8 oz (99.5 kg). Alert and oriented. Skin warm and dry. Oral mucosa is moist.   . Sclera anicteric, conjunctivae is pink. Thyroid not enlarged. No cervical lymphadenopathy. Lungs clear. Heart regular rate and rhythm.  Abdomen is soft. Bowel sounds are positive. No hepatomegaly. No abdominal masses felt. No tenderness.  No edema to lower extremities.           Assessment & Plan:  Positive cologuard. Needs colonoscopy to rule out colonic cancer, polyps. The risks of bleeding, perforation and infection were reviewed with patient.

## 2017-05-21 NOTE — Telephone Encounter (Signed)
Spoke with patient and advised him of Cologuard results and Dr.Nelson's recommendations and need for referral to see Dr. Laural Golden with verbal understanding.

## 2017-05-25 ENCOUNTER — Other Ambulatory Visit: Payer: Self-pay | Admitting: Family Medicine

## 2017-05-28 ENCOUNTER — Encounter (INDEPENDENT_AMBULATORY_CARE_PROVIDER_SITE_OTHER): Payer: Self-pay | Admitting: *Deleted

## 2017-05-28 ENCOUNTER — Telehealth (INDEPENDENT_AMBULATORY_CARE_PROVIDER_SITE_OTHER): Payer: Self-pay | Admitting: *Deleted

## 2017-05-28 DIAGNOSIS — R195 Other fecal abnormalities: Secondary | ICD-10-CM | POA: Insufficient documentation

## 2017-05-28 MED ORDER — PEG 3350-KCL-NA BICARB-NACL 420 G PO SOLR: 4000.0000 mL | Freq: Once | ORAL | 0 refills | Status: AC

## 2017-05-28 NOTE — Telephone Encounter (Signed)
Patient is scheduled for colonoscopy 06/26/17 -- he needs to stop Plavix 5 days & ASA 2 days prior -- please advise if ok to stop, thanks

## 2017-05-28 NOTE — Telephone Encounter (Signed)
Yes, he may stop prior

## 2017-05-28 NOTE — Telephone Encounter (Signed)
Patient needs trilyte 

## 2017-05-28 NOTE — Telephone Encounter (Signed)
Patient aware he may stop medication

## 2017-06-22 ENCOUNTER — Other Ambulatory Visit: Payer: Self-pay | Admitting: Family Medicine

## 2017-06-22 ENCOUNTER — Other Ambulatory Visit: Payer: Self-pay

## 2017-06-22 MED ORDER — LISINOPRIL 10 MG PO TABS: 10.0000 mg | ORAL_TABLET | Freq: Every day | ORAL | 0 refills | Status: DC

## 2017-06-26 ENCOUNTER — Other Ambulatory Visit: Payer: Self-pay

## 2017-06-26 ENCOUNTER — Encounter (HOSPITAL_COMMUNITY): Payer: Self-pay | Admitting: *Deleted

## 2017-06-26 ENCOUNTER — Ambulatory Visit (HOSPITAL_COMMUNITY): Payer: Medicare HMO

## 2017-06-26 ENCOUNTER — Ambulatory Visit (HOSPITAL_COMMUNITY)
Admission: RE | Admit: 2017-06-26 | Discharge: 2017-06-26 | Disposition: A | Discharge status: Home / Self Care | Payer: Medicare HMO | Source: Ambulatory Visit | Attending: Internal Medicine | Admitting: Internal Medicine

## 2017-06-26 ENCOUNTER — Encounter (HOSPITAL_COMMUNITY)
Admission: RE | Disposition: A | Discharge status: Home / Self Care | Payer: Self-pay | Source: Ambulatory Visit | Attending: Internal Medicine

## 2017-06-26 DIAGNOSIS — Z8673 Personal history of transient ischemic attack (TIA), and cerebral infarction without residual deficits: Secondary | ICD-10-CM | POA: Insufficient documentation

## 2017-06-26 DIAGNOSIS — Z7982 Long term (current) use of aspirin: Secondary | ICD-10-CM | POA: Diagnosis not present

## 2017-06-26 DIAGNOSIS — E785 Hyperlipidemia, unspecified: Secondary | ICD-10-CM | POA: Insufficient documentation

## 2017-06-26 DIAGNOSIS — D123 Benign neoplasm of transverse colon: Secondary | ICD-10-CM | POA: Diagnosis not present

## 2017-06-26 DIAGNOSIS — Z79899 Other long term (current) drug therapy: Secondary | ICD-10-CM | POA: Diagnosis not present

## 2017-06-26 DIAGNOSIS — Z7902 Long term (current) use of antithrombotics/antiplatelets: Secondary | ICD-10-CM | POA: Diagnosis not present

## 2017-06-26 DIAGNOSIS — D126 Benign neoplasm of colon, unspecified: Secondary | ICD-10-CM

## 2017-06-26 DIAGNOSIS — I10 Essential (primary) hypertension: Secondary | ICD-10-CM | POA: Diagnosis not present

## 2017-06-26 DIAGNOSIS — K5730 Diverticulosis of large intestine without perforation or abscess without bleeding: Secondary | ICD-10-CM | POA: Insufficient documentation

## 2017-06-26 DIAGNOSIS — I252 Old myocardial infarction: Secondary | ICD-10-CM | POA: Insufficient documentation

## 2017-06-26 DIAGNOSIS — D124 Benign neoplasm of descending colon: Secondary | ICD-10-CM | POA: Diagnosis not present

## 2017-06-26 DIAGNOSIS — Z1211 Encounter for screening for malignant neoplasm of colon: Secondary | ICD-10-CM | POA: Insufficient documentation

## 2017-06-26 DIAGNOSIS — R195 Other fecal abnormalities: Secondary | ICD-10-CM | POA: Diagnosis not present

## 2017-06-26 DIAGNOSIS — Z87891 Personal history of nicotine dependence: Secondary | ICD-10-CM | POA: Insufficient documentation

## 2017-06-26 DIAGNOSIS — Z9889 Other specified postprocedural states: Secondary | ICD-10-CM | POA: Diagnosis not present

## 2017-06-26 HISTORY — PX: COLONOSCOPY: SHX5424

## 2017-06-26 SURGERY — COLONOSCOPY
Anesthesia: Moderate Sedation

## 2017-06-26 MED ORDER — STERILE WATER FOR IRRIGATION IR SOLN
Status: DC | PRN
  Administered 2017-06-26: 15 mL

## 2017-06-26 MED ORDER — MIDAZOLAM HCL 5 MG/5ML IJ SOLN
INTRAMUSCULAR | Status: DC | PRN
  Administered 2017-06-26 (×2): 2 mg via INTRAVENOUS
  Administered 2017-06-26: 1 mg via INTRAVENOUS
  Administered 2017-06-26: 2 mg via INTRAVENOUS
  Administered 2017-06-26: 1 mg via INTRAVENOUS

## 2017-06-26 MED ORDER — MIDAZOLAM HCL 5 MG/5ML IJ SOLN
INTRAMUSCULAR | Status: AC
  Filled 2017-06-26: qty 10

## 2017-06-26 MED ORDER — MEPERIDINE HCL 50 MG/ML IJ SOLN
INTRAMUSCULAR | Status: DC | PRN
  Administered 2017-06-26 (×2): 25 mg via INTRAVENOUS

## 2017-06-26 MED ORDER — ENALAPRILAT 1.25 MG/ML IV SOLN
INTRAVENOUS | Status: AC
  Filled 2017-06-26: qty 2

## 2017-06-26 MED ORDER — MEPERIDINE HCL 50 MG/ML IJ SOLN
INTRAMUSCULAR | Status: AC
  Filled 2017-06-26: qty 1

## 2017-06-26 MED ORDER — SODIUM CHLORIDE 0.9 % IV SOLN
INTRAVENOUS | Status: DC
  Administered 2017-06-26: 07:00:00 via INTRAVENOUS

## 2017-06-26 NOTE — Discharge Instructions (Signed)
Resume aspirin today and clopidogrel tomorrow. Resume other medications as before   High-fiber diet. Patient will call with biopsy results and further recommendations.   Colonoscopy, Adult, Care After This sheet gives you information about how to care for yourself after your procedure. Your health care provider may also give you more specific instructions. If you have problems or questions, contact your health care provider. Dr Laural Golden:  [phone]; after hours and weekends call the hospital and ask for the GI doctor on call paged; they will call you back. What can I expect after the procedure? After the procedure, it is common to have:  A small amount of blood in your stool for 24 hours after the procedure.  Some gas.  Mild abdominal cramping or bloating.  Follow these instructions at home: General instructions   For the first 24 hours after the procedure: ? Do not drive or use machinery. ? Do not sign important documents. ? Do not drink alcohol. ? Rest often.  Take over-the-counter or prescription medicines only as told by your health care provider.  It is up to you to get the results of your procedure. Ask your health care provider, or the department performing the procedure, when your results will be ready. Relieving cramping and bloating  Try walking around when you have cramps or feel bloated. Eating and drinking  Drink enough fluid to keep your urine clear or pale yellow.  Resume your normal diet as instructed by your health care provider.  C care provider.ontact a health care provider if:  You have blood in your stool 2-3 days after the procedure. Get help right away if:  You have more than a small spotting of blood in your stool.  You pass large blood clots in your stool.  Your abdomen is swollen.  You have nausea or vomiting.  You have a fever.  You have increasing abdominal pain that is not relieved with medicine. This information is not intended to  replace advice given to you by your health care provider. Make sure you discuss any questions you have with your health care provider. Document Released: 09/18/2003 Document Revised: 10/29/2015 Document Reviewed: 04/17/2015 Elsevier Interactive Patient Education  2018 [employer].   High-Fiber Diet Fiber, also called dietary fiber, is a type of carbohydrate found in fruits, vegetables, whole grains, and beans. A high-fiber diet can have many health benefits. Your health care provider may recommend a high-fiber diet to help:  Prevent constipation. Fiber can make your bowel movements more regular.  Lower your cholesterol.  Relieve hemorrhoids, uncomplicated diverticulosis, or irritable bowel syndrome.  Prevent overeating as part of a weight-loss plan.  Prevent heart disease, type 2 diabetes, and certain cancers.  What is my plan? The recommended daily intake of fiber includes:  38 grams for men under age 55.  55 grams for men over age 26.  58 grams for women under age 16.  20 grams for women over age 32.  You can get the recommended daily intake of dietary fiber by eating a variety of fruits, vegetables, grains, and beans. Your health care provider may also recommend a fiber supplement if it is not possible to get enough fiber through your diet. What do I need to know about a high-fiber diet?  Fiber supplements have not been widely studied for their effectiveness, so it is better to get fiber through food sources.  Always check the fiber content on thenutrition facts label of any prepackaged food. Look for foods that contain at  least 5 grams of fiber per serving.  Ask your dietitian if you have questions about specific foods that are related to your condition, especially if those foods are not listed in the following section.  Increase your daily fiber consumption gradually. Increasing your intake of dietary fiber too quickly may cause bloating, cramping, or gas.  Drink  plenty of water. Water helps you to digest fiber. What foods can I eat? Grains Whole-grain breads. Multigrain cereal. Oats and oatmeal. Brown rice. Barley. Bulgur wheat. [place]. Bran muffins. Popcorn. Rye wafer crackers. Vegetables Sweet potatoes. Spinach. Kale. Artichokes. Cabbage. Broccoli. Green peas. Carrots. Squash. Fruits Berries. Pears. Apples. Oranges. Avocados. Prunes and raisins. Dried figs. Meats and Other Protein Sources Navy, kidney, pinto, and soy beans. Split peas. Lentils. Nuts and seeds. Dairy Fiber-fortified yogurt. Beverages Fiber-fortified soy milk. Fiber-fortified orange juice. Other Fiber bars. The items listed above may not be a complete list of recommended foods or beverages. Contact your dietitian for more options. What foods are not recommended? Grains White bread. Pasta made with refined flour. White rice. Vegetables Fried potatoes. Canned vegetables. Well-cooked vegetables. Fruits Fruit juice. Cooked, strained fruit. Meats and Other Protein Sources Fatty cuts of meat. Fried Sales executive or fried fish. Dairy Milk. Yogurt. Cream cheese. Sour cream. Beverages Soft drinks. Other Cakes and pastries. Butter and oils. The items listed above may not be a complete list of foods and beverages to avoid. Contact your dietitian for more information. What are some tips for including high-fiber foods in my diet?  Eat a wide variety of high-fiber foods.  Make sure that half of all grains consumed each day are whole grains.  Replace breads and cereals made from refined flour or white flour with whole-grain breads and cereals.  Replace white rice with brown rice, bulgur wheat, or millet.  Start the day with a breakfast that is high in fiber, such as a cereal that contains at least 5 grams of fiber per serving.  Use beans in place of meat in soups, salads, or pasta.  Eat high-fiber snacks, such as berries, raw vegetables, nuts, or popcorn. This information is not  intended to replace advice given to you by your health care provider. Make sure you discuss any questions you have with your health care provider. Document Released: 02/03/2005 Document Revised: 07/12/2015 Document Reviewed: 07/19/2013 Elsevier Interactive Patient Education  [employer].

## 2017-06-26 NOTE — H&P (Signed)
Albert Morris is an 67 y.o. male.   Chief Complaint: Patient is here for colonoscopy. HPI: Patient is 67 year old Afro-American male who was screened with Cologuard it came back positive.  He denies abdominal pain change in bowel habits rectal bleeding or melena.  His last colonoscopy reportedly was normal 27 years ago.  Past history significant for removal of large growth or polyp when he was 67 years old.  He states he had bleeding from it.  He recalls this growth or polyp was size of a golf ball and was pedunculated. Family history is negative for CRC. He has been off aspirin for 2 days and Plavix for 5 days.  Past Medical History:  Diagnosis Date  . CVA (cerebral vascular accident) ([place]) 06/21/2016  . Hyperlipidemia   . Hypertension   . Myocardial infarction ([place])   . Substance abuse [facility])     Past Surgical History:  Procedure Laterality Date  . CAROTID ENDARTERECTOMY Left   . [place]   growth removed  . ENDARTERECTOMY Left 08/01/2016   Procedure: ENDARTERECTOMY LEFT CAROTID;  Surgeon: Albert Mould, MD;  Location: [facility];  Service: Vascular;  Laterality: Left;  . PATCH ANGIOPLASTY Left 08/01/2016   Procedure: PATCH ANGIOPLASTY LEFT CAROTID ARTERY USING ZENOSURE BIOLOGIC PATCH;  Surgeon: Albert Mould, MD;  Location: [facility];  Service: Vascular;  Laterality: Left;  . TONSILLECTOMY      Family History  Problem Relation Age of Onset  . Breast cancer Mother        She is 10 years old, in [place]  . CVA Mother   . CAD Mother   . Arthritis Mother   . Cancer Mother   . Hypertension Mother   . Other Father        gunshot wound  . Early death Father        GSW  . Cancer Brother   . Cancer Brother   . Stroke Maternal Aunt   . Alcohol abuse Maternal Uncle   . Stroke Maternal Grandfather   . Colon cancer Neg Hx    Social History:  reports that he quit smoking about 13 years ago. He has a 14.50 pack-year smoking history. He has never used smokeless  tobacco. He reports that he does not drink alcohol or use drugs.  Allergies:  Allergies  Allergen Reactions  . No Known Allergies     Medications Prior to Admission  Medication Sig Dispense Refill  . aspirin EC 81 MG tablet Take 1 tablet (81 mg total) by mouth daily. 90 tablet 3  . atorvastatin (LIPITOR) 80 MG tablet TAKE 1 TABLET BY MOUTH EVERY DAY (Patient taking differently: TAKE 1 TABLET BY MOUTH EVERY DAY AT 6 PM) 90 tablet 0  . carvedilol (COREG) 3.125 MG tablet Take 1 tablet (3.125 mg total) by mouth 2 (two) times daily with a meal. 180 tablet 0  . clopidogrel (PLAVIX) 75 MG tablet Take 75 mg by mouth daily.    [name] lisinopril (PRINIVIL,ZESTRIL) 10 MG tablet Take 1 tablet (10 mg total) by mouth daily. 30 tablet 0  . polyethylene glycol-electrolytes (NULYTELY/GOLYTELY) 420 g solution Take 4,000 mLs by mouth once.  0    No results found for this or any previous visit (from the past 48 hour(s)). No results found.  ROS  Blood pressure (!) 168/98, pulse 70, temperature 98.3 F (36.8 C), temperature source Oral, resp. rate 20, height 6\' 2"  (1.88 m), weight 219 lb (99.3 kg), SpO2 100 %.  Physical Exam  Constitutional: He appears well-developed and well-nourished.  HENT:  Mouth/Throat: Oropharynx is clear and moist.  Eyes: Conjunctivae are normal. No scleral icterus.  Neck: No thyromegaly present.  Cardiovascular: Normal rate, regular rhythm and normal heart sounds.  No murmur heard. Respiratory: Effort normal and breath sounds normal.  GI:  Abdomen is full with a left paramedian scar.  It is soft and nontender without organomegaly or masses.  Musculoskeletal: He exhibits no edema.  Lymphadenopathy:    He has no cervical adenopathy.  Neurological: He is alert.  Skin: Skin is warm and dry.     Assessment/Plan Positive Cologuard test. Diagnostic colonoscopy.  Hildred Laser, MD 06/26/2017, 7:27 AM

## 2017-06-26 NOTE — Op Note (Signed)
[facility] Patient Name: Albert Morris Procedure Date: 06/26/2017 7:16 AM MRN: [id number] Date of Birth: [birth year] Attending MD: Hildred Laser , MD CSN: [id number] Age: 67 Admit Type: Outpatient Procedure:                Colonoscopy Indications:              Positive Cologuard test Providers:                Hildred Laser, MD, Charlsie Quest. Theda Sers RN, RN, Nelma Rothman, Technician Referring MD:             Lysle Morales, MD Medicines:                Meperidine 50 mg IV, Midazolam 7 mg IV Complications:            No immediate complications. Estimated Blood Loss:     Estimated blood loss was minimal. Procedure:                Pre-Anesthesia Assessment:                           - Prior to the procedure, a History and Physical                            was performed, and patient medications and                            allergies were reviewed. The patient's tolerance of                            previous anesthesia was also reviewed. The risks                            and benefits of the procedure and the sedation                            options and risks were discussed with the patient.                            All questions were answered, and informed consent                            was obtained. Prior Anticoagulants: The patient                            last took aspirin 2 days and Plavix (clopidogrel) 5                            days prior to the procedure. ASA Grade Assessment:                            III - A patient with severe systemic disease. After  reviewing the risks and benefits, the patient was                            deemed in satisfactory condition to undergo the                            procedure.                           After obtaining informed consent, the colonoscope                            was passed under direct vision. Throughout the                            procedure, the  patient's blood pressure, pulse, and                            oxygen saturations were monitored continuously. The                            [id number] ([id number]) scope was introduced through                            the anus and advanced to the the cecum, identified                            by appendiceal orifice and ileocecal valve. The                            colonoscopy was technically difficult and complex                            due to a redundant colon and a tortuous colon.                            Successful completion of the procedure was aided by                            changing the patient's position, using manual                            pressure and withdrawing and reinserting the scope.                            The patient tolerated the procedure well. The                            quality of the bowel preparation was good. Scope In: 7:41:52 AM Scope Out: 8:12:34 AM Scope Withdrawal Time: 0 hours 18 minutes 39 seconds  Total Procedure Duration: 0 hours 30 minutes 42 seconds  Findings:      The perianal and digital rectal examinations were normal.      A greater than 50 mm polyp was  found in the descending colon. The polyp       was multi-lobulated. Biopsies were taken with a cold forceps for       histology. For location marking, one hemostatic clip was successfully       placed (MR conditional).      Scattered medium-mouthed diverticula were found in the descending colon       and splenic flexure.      The retroflexed view of the distal rectum and anal verge was normal and       showed no anal or rectal abnormalities. Impression:               - One greater than 50 mm polyp at the descending                            colon.. Biopsied. Clip (MR conditional) was placed.                           - Diverticulosis in the descending colon and at the                            splenic flexure. Moderate Sedation:      Moderate (conscious) sedation was  administered by the endoscopy nurse       and supervised by the endoscopist. The following parameters were       monitored: oxygen saturation, heart rate, blood pressure, CO2       capnography and response to care. Total physician intraservice time was       40 minutes. Recommendation:           - Patient has a contact number available for                            emergencies. The signs and symptoms of potential                            delayed complications were discussed with the                            patient. Return to normal activities tomorrow.                            Written discharge instructions were provided to the                            patient.                           - High fiber diet.                           - Await pathology results.                           - KUB for polyp site location. Procedure Code(s):        --- Professional ---  [phone], Colonoscopy, flexible; with biopsy, single                            or multiple                           G0500, Moderate sedation services provided by the                            same physician or other qualified health care                            professional performing a gastrointestinal                            endoscopic service that sedation supports,                            requiring the presence of an independent trained                            observer to assist in the monitoring of the                            patient's level of consciousness and physiological                            status; initial 15 minutes of intra-service time;                            patient age 64 years or older (additional time may                            be reported with [phone], as appropriate)                           [phone], Moderate sedation services provided by the                            same physician or other qualified health care                            professional  performing the diagnostic or                            therapeutic service that the sedation supports,                            requiring the presence of an independent trained                            observer to assist in the monitoring of the                            patient's  level of consciousness and physiological                            status; each additional 15 minutes intraservice                            time (List separately in addition to code for                            primary service)                           [phone], Moderate sedation services provided by the                            same physician or other qualified health care                            professional performing the diagnostic or                            therapeutic service that the sedation supports,                            requiring the presence of an independent trained                            observer to assist in the monitoring of the                            patient's level of consciousness and physiological                            status; each additional 15 minutes intraservice                            time (List separately in addition to code for                            primary service)                           [phone], Unlisted procedure, colon Diagnosis Code(s):        --- Professional ---                           D12.3, Benign neoplasm of transverse colon (hepatic                            flexure or splenic flexure)                           R19.5, Other fecal abnormalities                           K57.30, Diverticulosis of large intestine  without                            perforation or abscess without bleeding CPT copyright 2017 American [employer] Association. All rights reserved. The codes documented in this report are preliminary and upon coder review may  be revised to meet current compliance requirements. Hildred Laser, MD Hildred Laser, MD 06/26/2017  8:32:11 AM This report has been signed electronically. Number of Addenda: 0

## 2017-07-01 ENCOUNTER — Encounter (HOSPITAL_COMMUNITY): Payer: Self-pay | Admitting: Internal Medicine

## 2017-07-15 DIAGNOSIS — I10 Essential (primary) hypertension: Secondary | ICD-10-CM | POA: Diagnosis not present

## 2017-07-15 DIAGNOSIS — E559 Vitamin D deficiency, unspecified: Secondary | ICD-10-CM | POA: Diagnosis not present

## 2017-07-15 DIAGNOSIS — Z8679 Personal history of other diseases of the circulatory system: Secondary | ICD-10-CM | POA: Diagnosis not present

## 2017-07-15 DIAGNOSIS — E785 Hyperlipidemia, unspecified: Secondary | ICD-10-CM | POA: Diagnosis not present

## 2017-07-17 DIAGNOSIS — K635 Polyp of colon: Secondary | ICD-10-CM | POA: Diagnosis not present

## 2017-07-29 ENCOUNTER — Ambulatory Visit: Payer: Self-pay | Admitting: Surgery

## 2017-07-29 DIAGNOSIS — K635 Polyp of colon: Secondary | ICD-10-CM | POA: Diagnosis not present

## 2017-07-29 NOTE — H&P (Signed)
CC: Endoscopically unresectable polyp-referred by Dr. Laural Golden. Here today with his cousin.  HPI: Albert Morris is a very pleasant 67 year old male with history of hypertension, hyperlipidemia, CVA, CAD, PAD referred to me by Dr. Laural Golden for evaluation of an endoscopically unresectable polyp. He had a positive colo-guard test done by his PCP triggered a colonoscopy. Dr. Laural Golden performed his colonoscopy on 06/26/17 which was notable for: 1. A greater than 5 cm polyp in the descending colon that was multilobulated. 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 A postprocedure x-ray demonstrated a linear density over the left abdomen. He did have a tortuous colon as colonoscopy is not clear exactly where this clip lies in his colon  He denies any symptoms related to this today. He denies any issues of black, tarry, or bloody stool. He denies abdominal pain. Prior to his most recent colonoscopy he states that the only other colonoscopy had was at the age of 55 and he reports that being normal  PMH: Hypertension (well controlled on oral antihypertensive), hyperlipidemia (well-controlled on statin), CVA (presumably from carotid stenosis and he has since undergone left carotid endarterectomy; prior to his CVA he had some dizziness but no focal neurologic deficits and today still has none); CAD (being followed by Dr. 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 Left carotid endarterectomy  FHx: Denies FHx of malignancy  Social: Denies use of tobacco/EtOH/drugs. 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  INTERVAL HX He is here today for follow-up. He denies any changes in his health. He has since obtain cardiac clearance and is deemed to be an intermediate risk for surgery but cleared. His physician as instructed him to hold the Plavix 5 days prior to the procedure. Today, he denies any complaints including abdominal pain, nausea/vomiting/fever/chills/diarrhea. He denies any blood in his stool.  The patient is a 66 year old male.   Allergies (Tanisha A. Owens Shark, Susan Moore; 07/29/2017 4:19 PM) No Known Drug Allergies [07/17/2017]: Allergies Reconciled   Medication History (Tanisha A. Owens Shark, RMA; 07/29/2017 4:19 PM) Plavix (75MG  Tablet, Oral) Active. Lisinopril (10MG  Tablet, Oral) Active. Carvedilol (3.125MG  Tablet, Oral) Active. Atorvastatin Calcium (10MG  Tablet, Oral) Active. Aspirin (81MG  Tablet DR, Oral) Active. Medications Reconciled    Review of Systems Harrell Gave M. Naryiah Schley MD; 07/29/2017 5:12 PM) General Not Present- Appetite Loss, Chills, Fatigue, Fever, Night Sweats, Weight Gain and Weight Loss. Skin Not Present- Change in Wart/Mole, Dryness, Hives, Jaundice, New Lesions, Non-Healing Wounds, Rash and Ulcer. HEENT Present- Wears glasses/contact lenses. Not Present- Earache, Hearing Loss, Hoarseness, Nose Bleed, Oral Ulcers, Ringing in the Ears, Seasonal Allergies, Sinus Pain, Sore Throat, Visual Disturbances and Yellow Eyes. Respiratory Not Present- Bloody sputum, Chronic Cough, Difficulty Breathing, Snoring and Wheezing. Breast Not Present- Breast Mass, Breast Pain, Nipple Discharge and Skin Changes. Cardiovascular Not Present- Chest Pain, Difficulty Breathing Lying Down, Leg Cramps, Palpitations, Rapid Heart Rate, Shortness of Breath and Swelling of Extremities. Gastrointestinal Not Present- Abdominal Pain, Bloating, Bloody Stool, Change in Bowel Habits, Chronic diarrhea, Constipation, Difficulty Swallowing, Excessive gas, Gets full quickly  at meals, Hemorrhoids, Indigestion, Nausea, Rectal Pain and Vomiting. Male Genitourinary Not Present- Blood in Urine, Change in Urinary Stream, Frequency, Impotence, Nocturia, Painful Urination, Urgency and Urine Leakage. Musculoskeletal Not Present- Back Pain, Joint Pain, Joint Stiffness, Muscle Pain, Muscle Weakness and Swelling of Extremities. Neurological Not Present- Decreased Memory, Fainting, Headaches, Numbness, Seizures, Tingling, Tremor, Trouble walking and Weakness. Psychiatric Not Present- Anxiety, Bipolar, Change in Sleep Pattern, Depression, Fearful and Frequent crying. Endocrine Not Present- Cold Intolerance, Excessive Hunger, Hair Changes, Heat Intolerance and New Diabetes. Hematology Present- Blood Thinners. Not Present- Easy Bruising, Excessive bleeding, Gland problems, HIV and Persistent Infections.  Vitals (Tanisha A. Brown RMA; 07/29/2017 4:19 PM) 07/29/2017 4:19 PM Weight: 219 lb Height: 75in Body Surface Area: 2.28 m Body Mass Index: 27.37 kg/m  Temp.: 97.69F  Pulse: 79 (Regular)  BP: 142/84 (Sitting, Left Arm, Standard)       Physical Exam Harrell Gave M. Indya Oliveria MD; 07/29/2017 5:13 PM) The physical exam findings are as follows: Note:Constitutional: No acute distress; conversant; no deformities Eyes: Moist conjunctiva; no lid lag; anicteric sclerae; pupils equal round and reactive to light Neck: Trachea midline; no palpable thyromegaly Lungs: Normal respiratory effort; no tactile fremitus CV: Regular rate and rhythm; no palpable thrill; no pitting edema GI: Abdomen soft, nontender, nondistended; no palpable hepatosplenomegaly; paramedian scar off to the left of midline MSK: Normal gait; no clubbing/cyanosis Psychiatric: Appropriate affect; alert and oriented 3 Lymphatic: No palpable cervical or axillary lymphadenopathy    Assessment & Plan Harrell Gave M. Morgen Linebaugh MD; 07/29/2017 5:15 PM) COLON POLYP (K63.5) Impression: Albert Morris is a very pleasant  66yoM with hx HTN, HLD, CAD, PAD, CVA presents as a referral by Dr. Laural Golden for an endoscopically unresectable polyp presumably in his descending colon -We discussed options going forward including nonsurgical which runs a risk of this degenerating and cancer who has already and subsequent bowel obstruction. We discussed the operative options well and he is opted to pursue this route. -Will schedule for colonoscopy the day prior to his procedure so he does not have to take 2 preps. The plan will be for him to remain on clear liquids after colonoscopy as per the ERAS pathway -Will plan laparoscopic vs open left hemicolectomy assuming tattoo is in the descending colon; flexible sigmoidoscopy to check anastomosis; possible stoma. Hold plavix 5d prior -The anatomy and physiology of the GI tract was discussed at length with the patient. The pathophysiology of colon polyps and cancer was discussed at length with associated pictures. -The planned procedure, material risks (including, but not limited to, pain, bleeding, infection, scarring, need for blood transfusion, damage to surrounding structures- blood vessels/nerves/viscus/organs, damage to ureter, urine leak, leak from anastomosis, need for additional procedures, need for stoma which may be permanent, hernia, recurrence, worsening of pre-existing medical problems, pneumonia, heart attack, stroke, death) benefits and alternatives to surgery were discussed at length. The patient's questions were answered to his satisfaction, he voiced understanding and elected to proceed with surgery. Additionally, we discussed typical postoperative expectations and the recovery process and possible need for adjuvant chemotherapy if he ends up having a colon cancer  Signed electronically by [name], MD (07/29/2017 5:15 PM)

## 2017-08-05 ENCOUNTER — Ambulatory Visit: Payer: Self-pay | Admitting: Surgery

## 2017-09-04 ENCOUNTER — Ambulatory Visit: Payer: Medicare HMO | Admitting: Family

## 2017-09-04 ENCOUNTER — Ambulatory Visit (HOSPITAL_COMMUNITY)
Admission: RE | Admit: 2017-09-04 | Discharge: 2017-09-04 | Disposition: A | Discharge status: Home / Self Care | Payer: Medicare HMO | Source: Ambulatory Visit | Attending: Vascular Surgery | Admitting: Vascular Surgery

## 2017-09-04 VITALS — BP 155/93 | HR 61 | Temp 97.8°F | Resp 16 | Ht 74.0 in

## 2017-09-04 DIAGNOSIS — I10 Essential (primary) hypertension: Secondary | ICD-10-CM | POA: Insufficient documentation

## 2017-09-04 DIAGNOSIS — Z48812 Encounter for surgical aftercare following surgery on the circulatory system: Secondary | ICD-10-CM | POA: Diagnosis not present

## 2017-09-04 DIAGNOSIS — I2510 Atherosclerotic heart disease of native coronary artery without angina pectoris: Secondary | ICD-10-CM | POA: Insufficient documentation

## 2017-09-04 DIAGNOSIS — Z9889 Other specified postprocedural states: Secondary | ICD-10-CM

## 2017-09-04 DIAGNOSIS — I252 Old myocardial infarction: Secondary | ICD-10-CM | POA: Insufficient documentation

## 2017-09-04 DIAGNOSIS — I6522 Occlusion and stenosis of left carotid artery: Secondary | ICD-10-CM

## 2017-09-04 DIAGNOSIS — I6521 Occlusion and stenosis of right carotid artery: Secondary | ICD-10-CM | POA: Insufficient documentation

## 2017-09-04 DIAGNOSIS — E785 Hyperlipidemia, unspecified: Secondary | ICD-10-CM | POA: Diagnosis not present

## 2017-09-04 NOTE — Progress Notes (Signed)
Chief Complaint: Follow up Extracranial Carotid Artery Stenosis   History of Present Illness  Albert Morris is a 67 y.o. male was admitted in May 2018 with a right cerebellar and posterior circulation infarct. He was found to have a 70-80% left carotid stenosis and duplex suggested a greater than 80% stenosis. Left carotid endarterectomy was recommended in order to lower his risk of future stroke. On 08/01/2016, he underwent a left carotid endarterectomy with bovine pericardial patch angioplasty by Dr. Scot Dock.  He states that he has done well since we saw him in June 2018.  He denies any weakness/numbness/clumsiness in his extremities.  He denies any amaurosis fugax or speech difficulty.  He has not had any chest pain or pain in his legs.   SClaudean Kinds PA-C last evaluated pt on 03-04-17. At that time pt had done well since surgery and had been asymptomatic. He was not on aspirin, but did take plavix daily.  Continue statin. His carotid duplex that day revealed that he has a left patent left carotid endarterectomy site and the right side was 1-39%.  We will see him back in 6 months and if no change, then yearly.  He will contact us if he has any issues.   He denies claudication type sx's in his legs with walking. He denies any subsequent stroke or TIA.   He is scheduled for laparoscopic vs open hemicolectomy on 09-25-17 by Dr. Dema Severin for tumor in his colon, pt states bx was benign.   Diabetic: yes, last A1C result on file was 6.2 on 09-17-16 Tobacco use: former smoker, quit in 2006, started at age 75 years  Pt meds include: Statin : yes ASA: yes Other anticoagulants/antiplatelets: Plavix   Past Medical History:  Diagnosis Date  . CVA (cerebral vascular accident) ([place]) 06/21/2016  . Hyperlipidemia   . Hypertension   . Myocardial infarction ([place])   . Substance abuse [facility])     Social History Social History   Tobacco Use  . Smoking status: Former Smoker    Packs/day: 0.50     Years: 29.00    Pack years: 14.50    Last attempt to quit: 2006    Years since quitting: 13.5  . Smokeless tobacco: Never Used  Substance Use Topics  . Alcohol use: No    Comment: quit 2006  . Drug use: No    Comment: remote h/o heavy marijuana use, also used cocaine and others    Family History Family History  Problem Relation Age of Onset  . Breast cancer Mother        She is 62 years old, in [place]  . CVA Mother   . CAD Mother   . Arthritis Mother   . Cancer Mother   . Hypertension Mother   . Other Father        gunshot wound  . Early death Father        GSW  . Cancer Brother   . Cancer Brother   . Stroke Maternal Aunt   . Alcohol abuse Maternal Uncle   . Stroke Maternal Grandfather   . Colon cancer Neg Hx     Surgical History Past Surgical History:  Procedure Laterality Date  . CAROTID ENDARTERECTOMY Left   . [place]   growth removed  . COLONOSCOPY N/A 06/26/2017   Procedure: COLONOSCOPY;  Surgeon: [name], MD;  [facility];  Service: Endoscopy;  Laterality: N/A;  730  . ENDARTERECTOMY Left 08/01/2016  Procedure: ENDARTERECTOMY LEFT CAROTID;  Surgeon: [name], MD;  Location: [facility];  Service: Vascular;  Laterality: Left;  . PATCH ANGIOPLASTY Left 08/01/2016   Procedure: PATCH ANGIOPLASTY LEFT CAROTID ARTERY USING ZENOSURE BIOLOGIC PATCH;  Surgeon: [name], MD;  Location: [facility];  Service: Vascular;  Laterality: Left;  . TONSILLECTOMY      Allergies  Allergen Reactions  . No Known Allergies     Current Outpatient Medications  Medication Sig Dispense Refill  . aspirin EC 81 MG tablet Take 1 tablet (81 mg total) by mouth daily. 90 tablet 3  . atorvastatin (LIPITOR) 80 MG tablet TAKE 1 TABLET BY MOUTH EVERY DAY (Patient taking differently: TAKE 1 TABLET BY MOUTH EVERY DAY AT 6 PM) 90 tablet 0  . carvedilol (COREG) 3.125 MG tablet Take 1 tablet (3.125 mg total) by mouth 2 (two) times daily with a meal.  (Patient taking differently: Take 81 mg by mouth 2 (two) times daily with a meal. ) 180 tablet 0  . clopidogrel (PLAVIX) 75 MG tablet Take 1 tablet (75 mg total) by mouth daily.    [name] lisinopril (PRINIVIL,ZESTRIL) 20 MG tablet TK 1 T PO D  3  . lisinopril (PRINIVIL,ZESTRIL) 10 MG tablet Take 1 tablet (10 mg total) by mouth daily. 30 tablet 0   No current facility-administered medications for this visit.     Review of Systems : See HPI for pertinent positives and negatives.  Physical Examination  Vitals:   09/04/17 1253 09/04/17 1256  BP: (!) 169/95 (!) 155/93  Pulse: 61   Resp: 16   Temp: 97.8 F (36.6 C)   TempSrc: Oral   SpO2: 98%   Height: 6\' 2"  (1.88 m)    Body mass index is 28.12 kg/m.  General: WDWN male in NAD GAIT: normal Eyes: PERRLA HENT: No gross abnormalities.  Pulmonary:  Respirations are non-labored, good air movement in all fields, CTAB, no rales, rhonchi, or wheezing. Cardiac: regular rhythm, no detected murmur.  VASCULAR EXAM Carotid Bruits Right Left   Negative Negative     Abdominal aortic pulse is not palpable. Radial pulses are 2+ palpable and equal.                                                                                                                            LE Pulses Right Left       POPLITEAL  not palpable   not palpable       POSTERIOR TIBIAL  not palpable   not palpable        DORSALIS PEDIS      ANTERIOR TIBIAL faintly palpable  faintly palpable    Gastrointestinal: soft, nontender, BS WNL, no r/g, no palpable masses. Musculoskeletal: no muscle atrophy/wasting. M/S 5/5 throughout, extremities without ischemic changes. Skin: No rashes, no ulcers, no cellulitis.   Neurologic:  A&O X 3; appropriate affect, sensation is normal; speech is normal, CN 2-12 intact, pain and light touch  intact in extremities, motor exam as listed above. Psychiatric: Normal thought content, mood appropriate to clinical situation.     Assessment: [name] is a 67 y.o. male who is s/p left carotid endarterectomy with bovine pericardial patch angioplasty on 08-01-16. He was admitted in May 2018 with a right cerebellar and posterior circulation infarct prior to the left CEA.  He has not had any subsequent stroke or TIA.   His atherosclerotic risk factors include former smoker , ore-diabetes, and CAD.   He takes a daily statin, Plavix, and ASA.    DATA Carotid Duplex (09-04-17): Right ICA: 1-39% stenosis Left ICA: CEA site with no restenosis Right vertebral artery flow is high resistant, left is antegrade. Bilateral subclavian artery waveforms are normal.  No change compared to the exam on 03-04-17.    Plan: Follow-up in 6 months with Carotid Duplex scan.   I discussed in depth with the patient the nature of atherosclerosis, and emphasized the importance of maximal medical management including strict control of blood pressure, blood glucose, and lipid levels, obtaining regular exercise, and continued cessation of smoking.  The patient is aware that without maximal medical management the underlying atherosclerotic disease process will progress, limiting the benefit of any interventions. The patient was given information about stroke prevention and what symptoms should prompt the patient to seek immediate medical care. Thank you for allowing [place] to participate in this patient's care.  Clemon Chambers, RN, MSN, FNP-C [facility] Office: [phone]  Clinic Physician: Donzetta Matters  09/04/17 1:14 PM

## 2017-09-04 NOTE — Patient Instructions (Signed)

## 2017-09-06 ENCOUNTER — Encounter: Payer: Self-pay | Admitting: Family

## 2017-09-10 ENCOUNTER — Other Ambulatory Visit: Payer: Self-pay | Admitting: Family Medicine

## 2017-09-15 ENCOUNTER — Ambulatory Visit: Payer: Medicare HMO | Admitting: Family Medicine

## 2017-09-17 ENCOUNTER — Encounter (HOSPITAL_COMMUNITY): Payer: Self-pay

## 2017-09-17 NOTE — Patient Instructions (Signed)
Albert Morris  09/17/2017   Your procedure is scheduled on: 09-25-17  Report to [facility] Main  Entrance              Report to admitting at    0530  AM    Call this number if you have problems the morning of surgery [phone]                 FOLLOW A CLEAR LIQUID DIET THE DAY OF YOUR BOWEL PREP FOLLOW BOWEL PREP PER MD ORDER   Remember: DRINK 2 PRESURGERY ENSURE DRINKS THE NIGHT BEFORE SURGERY AT   1000 PM AND 1 PRESURGERY DRINK THE DAY OF THE PROCEDURE 3 HOURS PRIOR TO SCHEDULED SURGERY. NO SOLIDS AFTER MIDNIGHT THE DAY PRIOR TO THE SURGERY. NOTHING BY MOUTH EXCEPT   CLEAR LIQUIDS UNTIL THREE HOURS PRIOR TO SCHEDULED SURGERY. PLEASE FINISH PRESURGERY ENSURE DRINK PER SURGEON ORDER 3 HOURS PRIOR TO SCHEDULED SURGERY TIME WHICH NEEDS   TO BE COMPLETED AT __0430AM_______.      Take these medicines the morning of surgery with A SIP OF WATER: CARVEDILOL, ATORVASTATIN               You may not have any metal on your body including hair pins and              piercings  Do not wear jewelry, make-up, lotions, powders or perfumes, deodorant             Do not wear nail polish.  Do not shave  48 hours prior to surgery.              Men may shave face and neck.   Do not bring valuables to the hospital. [place].  Contacts, dentures or bridgework may not be worn into surgery.  Leave suitcase in the car. After surgery it may be brought to your room.     Patients discharged the day of surgery will not be allowed to drive home.  Name and phone number of your driver:  Special Instructions: N/A              Please read over the following fact sheets you were given: _____________________________________________________________________             [facility] - Preparing for Surgery Before surgery, you can play an important role.  Because skin is not sterile, your skin needs to be as free of germs as  possible.  You can reduce the number of germs on your skin by washing with CHG (chlorahexidine gluconate) soap before surgery.  CHG is an antiseptic cleaner which kills germs and bonds with the skin to continue killing germs even after washing. Please DO NOT use if you have an allergy to CHG or antibacterial soaps.  If your skin becomes reddened/irritated stop using the CHG and inform your nurse when you arrive at Short Stay. Do not shave (including legs and underarms) for at least 48 hours prior to the first CHG shower.  You may shave your face/neck. Please follow these instructions carefully:  1.  Shower with CHG Soap the night before surgery and the  morning of Surgery.  2.  If you choose to wash your hair, wash your hair first as usual with your  normal  shampoo.  3.  After you shampoo, rinse your hair and body thoroughly to remove the  shampoo.                           4.  Use CHG as you would any other liquid soap.  You can apply chg directly  to the skin and wash                       Gently with a scrungie or clean washcloth.  5.  Apply the CHG Soap to your body ONLY FROM THE NECK DOWN.   Do not use on face/ open                           Wound or open sores. Avoid contact with eyes, ears mouth and genitals (private parts).                       Wash face,  Genitals (private parts) with your normal soap.             6.  Wash thoroughly, paying special attention to the area where your surgery  will be performed.  7.  Thoroughly rinse your body with warm water from the neck down.  8.  DO NOT shower/wash with your normal soap after using and rinsing off  the CHG Soap.                9.  Pat yourself dry with a clean towel.            10.  Wear clean pajamas.            11.  Place clean sheets on your bed the night of your first shower and do not  sleep with pets. Day of Surgery : Do not apply any lotions/deodorants the morning of surgery.  Please wear clean clothes to the hospital/surgery  center.  FAILURE TO FOLLOW THESE INSTRUCTIONS MAY RESULT IN THE CANCELLATION OF YOUR SURGERY PATIENT SIGNATURE_________________________________  NURSE SIGNATURE__________________________________  ________________________________________________________________________  WHAT IS A BLOOD TRANSFUSION? Blood Transfusion Information  A transfusion is the replacement of blood or some of its parts. Blood is made up of multiple cells which provide different functions.  Red blood cells carry oxygen and are used for blood loss replacement.  White blood cells fight against infection.  Platelets control bleeding.  Plasma helps clot blood.  Other blood products are available for specialized needs, such as hemophilia or other clotting disorders. BEFORE THE TRANSFUSION  Who gives blood for transfusions?   Healthy volunteers who are fully evaluated to make sure their blood is safe. This is blood bank blood. Transfusion therapy is the safest it has ever been in the practice of medicine. Before blood is taken from a donor, a complete history is taken to make sure that person has no history of diseases nor engages in risky social behavior (examples are intravenous drug use or sexual activity with multiple partners). The donor's travel history is screened to minimize risk of transmitting infections, such as malaria. The donated blood is tested for signs of infectious diseases, such as HIV and hepatitis. The blood is then tested to be sure it is compatible with you in order to minimize the chance of a transfusion reaction. If you or a relative donates blood, this is often done in anticipation of surgery and is not  appropriate for emergency situations. It takes many days to process the donated blood. RISKS AND COMPLICATIONS Although transfusion therapy is very safe and saves many lives, the main dangers of transfusion include:   Getting an infectious disease.  Developing a transfusion reaction. This is an  allergic reaction to something in the blood you were given. Every precaution is taken to prevent this. The decision to have a blood transfusion has been considered carefully by your caregiver before blood is given. Blood is not given unless the benefits outweigh the risks. AFTER THE TRANSFUSION  Right after receiving a blood transfusion, you will usually feel much better and more energetic. This is especially true if your red blood cells have gotten low (anemic). The transfusion raises the level of the red blood cells which carry oxygen, and this usually causes an energy increase.  The nurse administering the transfusion will monitor you carefully for complications. HOME CARE INSTRUCTIONS  No special instructions are needed after a transfusion. You may find your energy is better. Speak with your caregiver about any limitations on activity for underlying diseases you may have. SEEK MEDICAL CARE IF:   Your condition is not improving after your transfusion.  You develop redness or irritation at the intravenous (IV) site. SEEK IMMEDIATE MEDICAL CARE IF:  Any of the following symptoms occur over the next 12 hours:  Shaking chills.  You have a temperature by mouth above 102 F (38.9 C), not controlled by medicine.  Chest, back, or muscle pain.  People around you feel you are not acting correctly or are confused.  Shortness of breath or difficulty breathing.  Dizziness and fainting.  You get a rash or develop hives.  You have a decrease in urine output.  Your urine turns a dark color or changes to pink, red, or brown. Any of the following symptoms occur over the next 10 days:  You have a temperature by mouth above 102 F (38.9 C), not controlled by medicine.  Shortness of breath.  Weakness after normal activity.  The white part of the eye turns yellow (jaundice).  You have a decrease in the amount of urine or are urinating less often.  Your urine turns a dark color or changes  to pink, red, or brown. Document Released: 02/01/2000 Document Revised: 04/28/2011 Document Reviewed: 09/20/2007 ExitCare Patient Information 2014 [place].  _______________________________________________________________________  Incentive Spirometer  An incentive spirometer is a tool that can help keep your lungs clear and active. This tool measures how well you are filling your lungs with each breath. Taking long deep breaths may help reverse or decrease the chance of developing breathing (pulmonary) problems (especially infection) following:  A long period of time when you are unable to move or be active. BEFORE THE PROCEDURE   If the spirometer includes an indicator to show your best effort, your nurse or respiratory therapist will set it to a desired goal.  If possible, sit up straight or lean slightly forward. Try not to slouch.  Hold the incentive spirometer in an upright position. INSTRUCTIONS FOR USE  1. Sit on the edge of your bed if possible, or sit up as far as you can in bed or on a chair. 2. Hold the incentive spirometer in an upright position. 3. Breathe out normally. 4. Place the mouthpiece in your mouth and seal your lips tightly around it. 5. Breathe in slowly and as deeply as possible, raising the piston or the ball toward the top of the column. 6. Hold your  breath for 3-5 seconds or for as long as possible. Allow the piston or ball to fall to the bottom of the column. 7. Remove the mouthpiece from your mouth and breathe out normally. 8. Rest for a few seconds and repeat Steps 1 through 7 at least 10 times every 1-2 hours when you are awake. Take your time and take a few normal breaths between deep breaths. 9. The spirometer may include an indicator to show your best effort. Use the indicator as a goal to work toward during each repetition. 10. After each set of 10 deep breaths, practice coughing to be sure your lungs are clear. If you have an incision (the cut  made at the time of surgery), support your incision when coughing by placing a pillow or rolled up towels firmly against it. Once you are able to get out of bed, walk around indoors and cough well. You may stop using the incentive spirometer when instructed by your caregiver.  RISKS AND COMPLICATIONS  Take your time so you do not get dizzy or light-headed.  If you are in pain, you may need to take or ask for pain medication before doing incentive spirometry. It is harder to take a deep breath if you are having pain. AFTER USE  Rest and breathe slowly and easily.  It can be helpful to keep track of a log of your progress. Your caregiver can provide you with a simple table to help with this. If you are using the spirometer at home, follow these instructions: [place] IF:   You are having difficultly using the spirometer.  You have trouble using the spirometer as often as instructed.  Your pain medication is not giving enough relief while using the spirometer.  You develop fever of 100.5 F (38.1 C) or higher. SEEK IMMEDIATE MEDICAL CARE IF:   You cough up bloody sputum that had not been present before.  You develop fever of 102 F (38.9 C) or greater.  You develop worsening pain at or near the incision site. MAKE SURE YOU:   Understand these instructions.  Will watch your condition.  Will get help right away if you are not doing well or get worse. Document Released: 06/16/2006 Document Revised: 04/28/2011 Document Reviewed: 08/17/2006 [facility] Patient Information 2014 [place], [place].   ________________________________________________________________________

## 2017-09-18 ENCOUNTER — Encounter (HOSPITAL_COMMUNITY)
Admission: RE | Admit: 2017-09-18 | Discharge: 2017-09-18 | Disposition: A | Discharge status: Home / Self Care | Payer: Medicare HMO | Source: Ambulatory Visit | Attending: Surgery | Admitting: Surgery

## 2017-09-18 ENCOUNTER — Encounter (HOSPITAL_COMMUNITY): Payer: Self-pay

## 2017-09-18 ENCOUNTER — Other Ambulatory Visit: Payer: Self-pay

## 2017-09-18 DIAGNOSIS — Z01810 Encounter for preprocedural cardiovascular examination: Secondary | ICD-10-CM | POA: Diagnosis not present

## 2017-09-18 DIAGNOSIS — Z01812 Encounter for preprocedural laboratory examination: Secondary | ICD-10-CM | POA: Insufficient documentation

## 2017-09-18 HISTORY — DX: Prediabetes: R73.03

## 2017-09-18 HISTORY — DX: Pneumonia, unspecified organism: J18.9

## 2017-09-18 LAB — CBC WITH DIFFERENTIAL/PLATELET
Basophils Absolute: 0 10*3/uL (ref 0.0–0.1)
Basophils Relative: 1 %
EOS ABS: 0.5 10*3/uL (ref 0.0–0.7)
EOS PCT: 8 %
HEMATOCRIT: 37.3 % — AB (ref 39.0–52.0)
Hemoglobin: 12.7 g/dL — ABNORMAL LOW (ref 13.0–17.0)
LYMPHS ABS: 2 10*3/uL (ref 0.7–4.0)
LYMPHS PCT: 31 %
MCH: 29.8 pg (ref 26.0–34.0)
MCHC: 34 g/dL (ref 30.0–36.0)
MCV: 87.6 fL (ref 78.0–100.0)
MONO ABS: 0.7 10*3/uL (ref 0.1–1.0)
Monocytes Relative: 10 %
Neutro Abs: 3.2 10*3/uL (ref 1.7–7.7)
Neutrophils Relative %: 50 %
Platelets: 282 10*3/uL (ref 150–400)
RBC: 4.26 MIL/uL (ref 4.22–5.81)
RDW: 13.2 % (ref 11.5–15.5)
WBC: 6.4 10*3/uL (ref 4.0–10.5)

## 2017-09-18 LAB — COMPREHENSIVE METABOLIC PANEL
ALT: 27 U/L (ref 0–44)
AST: 25 U/L (ref 15–41)
Albumin: 4.1 g/dL (ref 3.5–5.0)
Alkaline Phosphatase: 74 U/L (ref 38–126)
Anion gap: 8 (ref 5–15)
BILIRUBIN TOTAL: 0.4 mg/dL (ref 0.3–1.2)
BUN: 16 mg/dL (ref 8–23)
CALCIUM: 9.1 mg/dL (ref 8.9–10.3)
CO2: 27 mmol/L (ref 22–32)
CREATININE: 1.08 mg/dL (ref 0.61–1.24)
Chloride: 104 mmol/L (ref 98–111)
GFR calc Af Amer: >60 mL/min (ref >60)
Glucose, Bld: 122 mg/dL — ABNORMAL HIGH (ref 70–99)
Potassium: 4.2 mmol/L (ref 3.5–5.1)
Sodium: 139 mmol/L (ref 135–145)
TOTAL PROTEIN: 7.4 g/dL (ref 6.5–8.1)

## 2017-09-18 LAB — APTT: APTT: 34 s (ref 24–36)

## 2017-09-18 LAB — HEMOGLOBIN A1C
HEMOGLOBIN A1C: 6.5 % — AB (ref 4.8–5.6)
Mean Plasma Glucose: 139.85 mg/dL

## 2017-09-18 LAB — PROTIME-INR
INR: 0.98
Prothrombin Time: 12.9 seconds (ref 11.4–15.2)

## 2017-09-18 LAB — NO BLOOD PRODUCTS

## 2017-09-18 NOTE — Progress Notes (Signed)
Stress 07-03-17 epic   06-26-16 epic

## 2017-09-21 NOTE — Progress Notes (Signed)
Clearance Dr. Kathi Ludwig 07-24-17 on chart

## 2017-09-23 MED ORDER — BUPIVACAINE LIPOSOME 1.3 % IJ SUSP
20.0000 mL | INTRAMUSCULAR | Status: DC
  Filled 2017-09-23: qty 20

## 2017-09-28 ENCOUNTER — Other Ambulatory Visit: Payer: Self-pay

## 2017-09-28 DIAGNOSIS — Z9889 Other specified postprocedural states: Secondary | ICD-10-CM

## 2017-09-28 DIAGNOSIS — I6522 Occlusion and stenosis of left carotid artery: Secondary | ICD-10-CM

## 2017-09-30 NOTE — Progress Notes (Signed)
Patient called with new date and time of surgery .   Surgery now on 10/08/2017 Surgery Time: 1100am-255pm.   Patient to arrive at 0900am.   Patient verbalized understanding of above.   Patient aware to drink Ensure preop drinks nite before as usual and Ensure preop drink am of surgery at 0800am.   Patient again verbalized understanding.   Reviewed preop hibiclens shower with patient. Patient verbalized he is to take Carvedilol and Atorvastatin am of surgery with sip of water.

## 2017-10-07 ENCOUNTER — Other Ambulatory Visit: Payer: Self-pay

## 2017-10-07 ENCOUNTER — Ambulatory Visit (HOSPITAL_COMMUNITY): Payer: Medicare HMO | Admitting: Certified Registered Nurse Anesthetist

## 2017-10-07 ENCOUNTER — Ambulatory Visit (HOSPITAL_COMMUNITY)
Admission: RE | Admit: 2017-10-07 | Discharge: 2017-10-07 | Disposition: A | Discharge status: Home / Self Care | Payer: Medicare HMO | Source: Ambulatory Visit | Attending: Surgery | Admitting: Surgery

## 2017-10-07 ENCOUNTER — Encounter (HOSPITAL_COMMUNITY)
Admission: RE | Disposition: A | Discharge status: Home / Self Care | Payer: Self-pay | Source: Ambulatory Visit | Attending: Surgery

## 2017-10-07 ENCOUNTER — Encounter (HOSPITAL_COMMUNITY): Payer: Self-pay | Admitting: Certified Registered Nurse Anesthetist

## 2017-10-07 DIAGNOSIS — Z79899 Other long term (current) drug therapy: Secondary | ICD-10-CM | POA: Insufficient documentation

## 2017-10-07 DIAGNOSIS — N179 Acute kidney failure, unspecified: Secondary | ICD-10-CM | POA: Diagnosis not present

## 2017-10-07 DIAGNOSIS — K660 Peritoneal adhesions (postprocedural) (postinfection): Secondary | ICD-10-CM | POA: Diagnosis not present

## 2017-10-07 DIAGNOSIS — E785 Hyperlipidemia, unspecified: Secondary | ICD-10-CM

## 2017-10-07 DIAGNOSIS — I2510 Atherosclerotic heart disease of native coronary artery without angina pectoris: Secondary | ICD-10-CM | POA: Diagnosis not present

## 2017-10-07 DIAGNOSIS — Z87891 Personal history of nicotine dependence: Secondary | ICD-10-CM

## 2017-10-07 DIAGNOSIS — Z8673 Personal history of transient ischemic attack (TIA), and cerebral infarction without residual deficits: Secondary | ICD-10-CM

## 2017-10-07 DIAGNOSIS — I739 Peripheral vascular disease, unspecified: Secondary | ICD-10-CM | POA: Diagnosis not present

## 2017-10-07 DIAGNOSIS — K635 Polyp of colon: Secondary | ICD-10-CM

## 2017-10-07 DIAGNOSIS — D124 Benign neoplasm of descending colon: Secondary | ICD-10-CM | POA: Diagnosis not present

## 2017-10-07 DIAGNOSIS — K5730 Diverticulosis of large intestine without perforation or abscess without bleeding: Secondary | ICD-10-CM | POA: Diagnosis not present

## 2017-10-07 DIAGNOSIS — D126 Benign neoplasm of colon, unspecified: Secondary | ICD-10-CM | POA: Diagnosis not present

## 2017-10-07 DIAGNOSIS — I10 Essential (primary) hypertension: Secondary | ICD-10-CM | POA: Diagnosis not present

## 2017-10-07 DIAGNOSIS — D125 Benign neoplasm of sigmoid colon: Secondary | ICD-10-CM | POA: Diagnosis not present

## 2017-10-07 HISTORY — PX: COLONOSCOPY WITH PROPOFOL: SHX5780

## 2017-10-07 HISTORY — PX: SUBMUCOSAL INJECTION: SHX5543

## 2017-10-07 SURGERY — COLONOSCOPY WITH PROPOFOL
Anesthesia: Monitor Anesthesia Care

## 2017-10-07 MED ORDER — SPOT INK MARKER SYRINGE KIT
PACK | SUBMUCOSAL | Status: AC
  Filled 2017-10-07: qty 10

## 2017-10-07 MED ORDER — LABETALOL HCL 5 MG/ML IV SOLN
INTRAVENOUS | Status: DC | PRN
  Administered 2017-10-07: 2.5 mg via INTRAVENOUS

## 2017-10-07 MED ORDER — LACTATED RINGERS IV SOLN
INTRAVENOUS | Status: DC
  Administered 2017-10-07: 13:00:00 via INTRAVENOUS

## 2017-10-07 MED ORDER — PROPOFOL 10 MG/ML IV BOLUS
INTRAVENOUS | Status: DC | PRN
  Administered 2017-10-07: 30 mg via INTRAVENOUS

## 2017-10-07 MED ORDER — ONDANSETRON HCL 4 MG/2ML IJ SOLN
INTRAMUSCULAR | Status: DC | PRN
  Administered 2017-10-07: 4 mg via INTRAVENOUS

## 2017-10-07 MED ORDER — LABETALOL HCL 5 MG/ML IV SOLN
5.0000 mg | Freq: Once | INTRAVENOUS | Status: AC
  Administered 2017-10-07: 5 mg via INTRAVENOUS
  Filled 2017-10-07: qty 4

## 2017-10-07 MED ORDER — SPOT INK MARKER SYRINGE KIT
PACK | SUBMUCOSAL | Status: DC | PRN
  Administered 2017-10-07: 4 mL via SUBMUCOSAL

## 2017-10-07 MED ORDER — SODIUM CHLORIDE 0.9 % IV SOLN: INTRAVENOUS | Status: DC

## 2017-10-07 MED ORDER — PROPOFOL 10 MG/ML IV BOLUS
INTRAVENOUS | Status: AC
  Filled 2017-10-07: qty 60

## 2017-10-07 MED ORDER — PROPOFOL 500 MG/50ML IV EMUL
INTRAVENOUS | Status: DC | PRN
  Administered 2017-10-07: 150 ug/kg/min via INTRAVENOUS

## 2017-10-07 NOTE — Anesthesia Postprocedure Evaluation (Signed)
Anesthesia Post Note  Patient: Albert Morris  Procedure(s) Performed: COLONOSCOPY WITH PROPOFOL WITH TATTOO (N/A )     Patient location during evaluation: PACU Anesthesia Type: MAC Level of consciousness: awake and alert Pain management: pain level controlled Vital Signs Assessment: post-procedure vital signs reviewed and stable Respiratory status: spontaneous breathing, nonlabored ventilation, respiratory function stable and patient connected to nasal cannula oxygen Cardiovascular status: stable and blood pressure returned to baseline Postop Assessment: no apparent nausea or vomiting Anesthetic complications: no    Last Vitals:  Vitals:   10/07/17 1400 10/07/17 1410  BP: (!) 164/89 (!) 175/96  Pulse: 64 65  Resp: 18 (!) 22  Temp:    SpO2: 95% 97%    Last Pain:  Vitals:   10/07/17 1410  TempSrc:   PainSc: 0-No pain                 Ryan P Ellender

## 2017-10-07 NOTE — Anesthesia Procedure Notes (Signed)
Procedure Name: MAC Date/Time: 10/07/2017 1:13 PM Performed by: Maxwell Caul, CRNA Pre-anesthesia Checklist: Patient identified, Emergency Drugs available, Suction available and Patient being monitored Patient Re-evaluated:Patient Re-evaluated prior to induction Oxygen Delivery Method: Simple face mask

## 2017-10-07 NOTE — Anesthesia Preprocedure Evaluation (Addendum)
Anesthesia Evaluation  Patient identified by MRN, date of birth, ID band Patient awake    Reviewed: Allergy & Precautions, NPO status , Patient's Chart, lab work & pertinent test results, reviewed documented beta blocker date and time   Airway Mallampati: III  TM Distance: >3 FB Neck ROM: Full    Dental no notable dental hx.    Pulmonary former smoker,    Pulmonary exam normal breath sounds clear to auscultation       Cardiovascular hypertension, Pt. on medications and Pt. on home beta blockers + CAD and + Past MI  Normal cardiovascular exam Rhythm:Regular Rate:Normal  Sees cardiologist    Neuro/Psych CVA, No Residual Symptoms negative psych ROS   GI/Hepatic (+)     substance abuse  ,   Endo/Other  Pre-diabetes  Renal/GU      Musculoskeletal   Abdominal   Peds  Hematology HLD   Anesthesia Other Findings colon polyp  Reproductive/Obstetrics                            Anesthesia Physical  Anesthesia Plan  ASA: III  Anesthesia Plan: MAC   Post-op Pain Management:    Induction: Intravenous  PONV Risk Score and Plan: 2 and Propofol infusion and Treatment may vary due to age or medical condition  Airway Management Planned: Simple Face Mask  Additional Equipment:   Intra-op Plan:   Post-operative Plan:   Informed Consent: I have reviewed the patients History and Physical, chart, labs and discussed the procedure including the risks, benefits and alternatives for the proposed anesthesia with the patient or authorized representative who has indicated his/her understanding and acceptance.     Plan Discussed with: CRNA  Anesthesia Plan Comments:        Anesthesia Quick Evaluation

## 2017-10-07 NOTE — Discharge Instructions (Signed)

## 2017-10-07 NOTE — Op Note (Signed)
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 Guess MD, MD, Angus Seller, Elspeth Cho Tech., Technician, Virgia Land, CRNA Referring MD:              Medicines:                Monitored Anesthesia Care Complications:            No immediate complications. Estimated Blood Loss:     Estimated blood loss: none. Procedure:                Pre-Anesthesia Assessment:                           - The risks and benefits of the procedure and the                            sedation options and risks were discussed with the                            patient. All questions were answered and informed                            consent was obtained.                           - Patient identification and proposed procedure                            were verified prior to the procedure by the                            physician, the nurse and the anesthetist. The                            procedure was verified in the pre-procedure area in                            the endoscopy suite.                           - ASA Grade Assessment: III - A patient with severe                            systemic disease.                           - After reviewing the risks and  benefits, the                            patient was deemed in satisfactory condition to                            undergo the procedure.                           - The anesthesia plan was to use monitored                            anesthesia care (MAC).                           After obtaining informed consent, the colonoscope                            was passed under direct vision. Throughout  the                            procedure, the patient's blood pressure, pulse, and                            oxygen saturations were monitored continuously. The                            [id number]-[id number] ([id number] Olympus adult colonoscope was                            introduced through the anus and advanced to the the                            splenic flexure to examine a mass/polyp which was                            found on a previous colonoscopy for purposes of                            lesion tattooing. This had been previously marked                            with a clip but was not tattoo'd. Therefore,                            tattooing of the lesion was necessary to ensure the                            appropriate segment of colon is removed at the                            surgery scheduled for tomorrow. The splenic flexure  was the intended extent. The colonoscopy was                            performed without difficulty. The patient tolerated                            the procedure well. The quality of the bowel                            preparation was adequate. Scope In: 1:23:17 PM Scope Out: 1:39:35 PM Total Procedure Duration: 0 hours 16 minutes 18 seconds  Findings:      The perianal and digital rectal examinations were normal. Pertinent       negatives include normal sphincter tone.      A greater than 50 mm polyp was found in the mid descending colon. The       polyp was semi-sessile. Area was tattooed with an injection of 4 mL of       Spot (carbon black) - tattoo was injected near circumferentially       proximally and distally.      Multiple small and large-mouthed diverticula were found in the sigmoid       colon and descending colon.      The exam was otherwise without abnormality on direct and retroflexion       views. Impression:               - One greater than 50 mm polyp in the mid                            descending  colon. Tattooed.                           - Diverticulosis in the sigmoid colon and in the                            descending colon.                           - The examination was otherwise normal on direct                            and retroflexion views.                           - No specimens collected. Moderate Sedation:      N/A- Per Anesthesia Care Recommendation:           - Discharge patient to home.                           - As discussed for bowel preparation prior to                            planned surgery tomorrow                           - Repeat colonoscopy in 1 year for surveillance.                           -  Return to endoscopist at appointment to be                            scheduled. Procedure Code(s):        --- Professional ---                           [phone], 4, Colonoscopy, flexible; with directed                            submucosal injection(s), any substance Diagnosis Code(s):        --- Professional ---                           D12.4, Benign neoplasm of descending colon                           D12.6, Benign neoplasm of colon, unspecified                           K57.30, Diverticulosis of large intestine without                            perforation or abscess without bleeding CPT copyright 2017 American Medical Association. All rights reserved. The codes documented in this report are preliminary and upon coder review may  be revised to meet current compliance requirements. [name], MD [name] MD, MD 10/07/2017 1:50:06 PM This report has been signed electronically. Number of Addenda: 0

## 2017-10-07 NOTE — Transfer of Care (Signed)
Immediate Anesthesia Transfer of Care Note  Patient: Albert Morris  Procedure(s) Performed: COLONOSCOPY WITH PROPOFOL WITH TATTOO (N/A )  Patient Location: PACU  Anesthesia Type:MAC  Level of Consciousness: awake, alert  and oriented  Airway & Oxygen Therapy: Patient Spontanous Breathing and Patient connected to face mask oxygen  Post-op Assessment: Report given to RN and Post -op Vital signs reviewed and stable  Post vital signs: Reviewed and stable  Last Vitals:  Vitals Value Taken Time  BP 139/88 10/07/2017  1:50 PM  Temp    Pulse 65 10/07/2017  1:49 PM  Resp 23 10/07/2017  1:50 PM  SpO2 100 % 10/07/2017  1:49 PM  Vitals shown include unvalidated device data.  Last Pain:  Vitals:   10/07/17 1228  TempSrc: Oral  PainSc: 0-No pain         Complications: No apparent anesthesia complications

## 2017-10-07 NOTE — H&P (Signed)
CC: Here today for colonoscopy for purposes of tattooing lesion in colon - hx of endoscopically unresectable polyp-referred by Dr. Laural Golden  HPI: Albert Morris is a very pleasant 67 year old male with history of hypertension, hyperlipidemia, CVA, CAD, PAD referred to me by Dr. Laural Golden for evaluation of an endoscopically unresectable polyp. He had a positive colo-guard test done by his PCP triggered a colonoscopy. Dr. Laural Golden performed his colonoscopy on 06/26/17 which was notable for: 1. A greater than 5 cm polyp in the descending colon that was multilobulated. 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 A postprocedure x-ray demonstrated a linear density over the left abdomen. He did have a tortuous colon as colonoscopy is not clear exactly where this clip lies in his colon  He denies any symptoms related to this. He denies any issues of black, tarry, or bloody stool. He denies abdominal pain. Prior to his most recent colonoscopy he states that the only other colonoscopy had was at the age of 66 and he reports that being normal  His physician has instructed him to hold the Plavix 5 days prior to the procedure. Today, he denies any complaints including abdominal pain, nausea/vomiting/fever/chills/diarrhea. Took his prep yesterday and stool came out clear.    PMH: Hypertension (well controlled on oral antihypertensive), hyperlipidemia (well-controlled on statin), CVA (presumably from carotid stenosis and he has since undergone left carotid endarterectomy; prior to his CVA he had some dizziness but no focal neurologic deficits and today still has none); CAD (being followed by Dr. 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 Left carotid endarterectomy  FHx: Denies FHx of malignancy  Social: Denies use of tobacco/EtOH/drugs. 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   Past Medical History:  Diagnosis Date  . CVA (cerebral vascular accident) ([place]) 06/21/2016  . Hyperlipidemia   . Hypertension   . Myocardial infarction ([place])   . Pneumonia   . Pre-diabetes   . Substance abuse [facility])     Past Surgical History:  Procedure Laterality Date  . CAROTID ENDARTERECTOMY Left   . COLON SURGERY  [phone]   growth removed, Laparoscopic Vs. open Left hemicolectomy Dr. Winter 09-25-17  . COLONOSCOPY N/A 06/26/2017   Procedure: COLONOSCOPY;  Surgeon: [name], MD;  [facility];  Service: Endoscopy;  Laterality: N/A;  730  . ENDARTERECTOMY Left 08/01/2016   Procedure: ENDARTERECTOMY LEFT CAROTID;  Surgeon: [name], MD;  Location: [place];  Service: Vascular;  Laterality: Left;  . PATCH ANGIOPLASTY Left 08/01/2016   Procedure: PATCH ANGIOPLASTY LEFT CAROTID ARTERY USING ZENOSURE BIOLOGIC PATCH;  Surgeon: [name], MD;  Location: [place];  Service: Vascular;  Laterality: Left;  . TONSILLECTOMY      Family History  Problem Relation Age of Onset  . Breast cancer Mother        She is 21 years old, in [place]  . CVA Mother   . CAD Mother   . Arthritis Mother   . Cancer Mother   . Hypertension Mother   . Other  Father        gunshot wound  . Early death Father        GSW  . Cancer Brother   . Cancer Brother   . Stroke Maternal Aunt   . Alcohol abuse Maternal Uncle   . Stroke Maternal Grandfather   . Colon cancer Neg Hx     Social:  reports that he quit smoking about 13 years ago. He has a 14.50 pack-year smoking history. He has never used smokeless tobacco. He reports that he does not drink alcohol or use  drugs.  Allergies:  Allergies  Allergen Reactions  . Blood-Group Specific Substance     No whole blood products    Medications: I have reviewed the patient's current medications.  No results found for this or any previous visit (from the past 48 hour(s)).  No results found.  ROS - all of the below systems have been reviewed with the patient and positives are indicated with bold text General: chills, fever or night sweats Eyes: blurry vision or double vision ENT: epistaxis or sore throat Allergy/Immunology: itchy/watery eyes or nasal congestion Hematologic/Lymphatic: bleeding problems, blood clots or swollen lymph nodes Endocrine: temperature intolerance or unexpected weight changes Breast: new or changing breast lumps or nipple discharge Resp: cough, shortness of breath, or wheezing CV: chest pain or dyspnea on exertion GI: as per HPI GU: dysuria, trouble voiding, or hematuria MSK: joint pain or joint stiffness Neuro: TIA or stroke symptoms Derm: pruritus and skin lesion changes Psych: anxiety and depression  PE Blood pressure (!) 204/95, pulse 74, temperature 97.8 F (36.6 C), temperature source Oral, resp. rate 19, height 6\' 3"  (1.905 m), weight 99.3 kg, SpO2 100 %. Constitutional: NAD; conversant; no deformities Eyes: Moist conjunctiva; no lid lag; anicteric; PERRL Neck: Trachea midline; no thyromegaly Lungs: Normal respiratory effort; no tactile fremitus CV: RRR; no palpable thrills; no pitting edema GI: Abd soft, NT/ND; no palpable hepatosplenomegaly MSK: Normal gait; no clubbing/cyanosis Psychiatric: Appropriate affect; alert and oriented x3 Lymphatic: No palpable cervical or axillary lymphadenopathy   A/P: Albert Morris is a very pleasant 71yoM with hx HTN, HLD, CAD, PAD, CVA presents as a referral by Dr. Laural Golden for an endoscopically unresectable polyp presumably in his descending colon  -We discussed options going forward including nonsurgical which runs a risk  of this degenerating into a cancer and subsequent bowel obstruction. We discussed the operative options well and he is opted to pursue this route. -Planning colonoscopy today for purpose of tattoo'ing lesion which will facilitate removal as planned tomorrow -Will plan for him to remain on clear liquids after colonoscopy as per the ERAS pathway -Will plan laparoscopic vs open left hemicolectomy assuming tattoo is in the descending colon; flexible sigmoidoscopy to check anastomosis; possible stoma. Hold plavix 5d prior -The anatomy and physiology of the GI tract was discussed at length with the patient. The pathophysiology of colon polyps and cancer was discussed at length with associated pictures. -The planned procedure, material risks (including, but not limited to, pain, bleeding, infection, damage to spleen, need for blood transfusion, perforation, need for additional procedures, need for stoma which may be permanent,  pneumonia, heart attack, stroke, death) benefits and alternatives to colonoscopy were discussed at length. The patient's questions were answered to his satisfaction, he voiced understanding and elected to proceed.  Sharon Mt. Dema Severin, M.D. General and Colorectal Surgery [facility]

## 2017-10-08 ENCOUNTER — Encounter (HOSPITAL_COMMUNITY)
Admission: RE | Disposition: A | Discharge status: Home / Self Care | Payer: Self-pay | Source: Home / Self Care | Attending: Surgery

## 2017-10-08 ENCOUNTER — Inpatient Hospital Stay (HOSPITAL_COMMUNITY): Payer: Medicare HMO | Admitting: Anesthesiology

## 2017-10-08 ENCOUNTER — Encounter (HOSPITAL_COMMUNITY): Payer: Self-pay

## 2017-10-08 ENCOUNTER — Inpatient Hospital Stay (HOSPITAL_COMMUNITY)
Admission: RE | Admit: 2017-10-08 | Discharge: 2017-10-12 | DRG: 330 | Disposition: A | Discharge status: Home / Self Care | Payer: Medicare HMO | Attending: General Surgery | Admitting: General Surgery

## 2017-10-08 DIAGNOSIS — K5730 Diverticulosis of large intestine without perforation or abscess without bleeding: Secondary | ICD-10-CM | POA: Diagnosis present

## 2017-10-08 DIAGNOSIS — K660 Peritoneal adhesions (postprocedural) (postinfection): Secondary | ICD-10-CM | POA: Diagnosis present

## 2017-10-08 DIAGNOSIS — I10 Essential (primary) hypertension: Secondary | ICD-10-CM | POA: Diagnosis present

## 2017-10-08 DIAGNOSIS — N179 Acute kidney failure, unspecified: Secondary | ICD-10-CM | POA: Diagnosis not present

## 2017-10-08 DIAGNOSIS — I739 Peripheral vascular disease, unspecified: Secondary | ICD-10-CM | POA: Diagnosis present

## 2017-10-08 DIAGNOSIS — K635 Polyp of colon: Secondary | ICD-10-CM | POA: Diagnosis present

## 2017-10-08 DIAGNOSIS — E785 Hyperlipidemia, unspecified: Secondary | ICD-10-CM | POA: Diagnosis present

## 2017-10-08 DIAGNOSIS — D124 Benign neoplasm of descending colon: Principal | ICD-10-CM | POA: Diagnosis present

## 2017-10-08 DIAGNOSIS — I2510 Atherosclerotic heart disease of native coronary artery without angina pectoris: Secondary | ICD-10-CM | POA: Diagnosis present

## 2017-10-08 DIAGNOSIS — Z8673 Personal history of transient ischemic attack (TIA), and cerebral infarction without residual deficits: Secondary | ICD-10-CM | POA: Diagnosis not present

## 2017-10-08 HISTORY — PX: LAPAROSCOPIC RIGHT HEMI COLECTOMY: SHX5926

## 2017-10-08 HISTORY — PX: FLEXIBLE SIGMOIDOSCOPY: SHX5431

## 2017-10-08 LAB — NO BLOOD PRODUCTS

## 2017-10-08 SURGERY — LAPAROSCOPIC RIGHT HEMI COLECTOMY
Anesthesia: General | Site: Abdomen

## 2017-10-08 MED ORDER — BUPIVACAINE-EPINEPHRINE 0.5% -1:200000 IJ SOLN
INTRAMUSCULAR | Status: DC | PRN
  Administered 2017-10-08: 15 mL

## 2017-10-08 MED ORDER — LABETALOL HCL 5 MG/ML IV SOLN: 5.0000 mg | INTRAVENOUS | Status: DC | PRN

## 2017-10-08 MED ORDER — ONDANSETRON HCL 4 MG/2ML IJ SOLN
INTRAMUSCULAR | Status: DC | PRN
  Administered 2017-10-08: 4 mg via INTRAVENOUS

## 2017-10-08 MED ORDER — ALUM & MAG HYDROXIDE-SIMETH 200-200-20 MG/5ML PO SUSP
30.0000 mL | Freq: Four times a day (QID) | ORAL | Status: DC | PRN
  Administered 2017-10-09 – 2017-10-11 (×3): 30 mL via ORAL
  Filled 2017-10-08 (×3): qty 30

## 2017-10-08 MED ORDER — EPHEDRINE SULFATE 50 MG/ML IJ SOLN
INTRAMUSCULAR | Status: DC | PRN
  Administered 2017-10-08: 10 mg via INTRAVENOUS
  Administered 2017-10-08: 5 mg via INTRAVENOUS
  Administered 2017-10-08: 10 mg via INTRAVENOUS

## 2017-10-08 MED ORDER — LACTATED RINGERS IR SOLN
Status: DC | PRN
  Administered 2017-10-08: 1000 mL

## 2017-10-08 MED ORDER — LIDOCAINE 20MG/ML (2%) 15 ML SYRINGE OPTIME
INTRAMUSCULAR | Status: DC | PRN
  Administered 2017-10-08: 1.5 mg/kg/h via INTRAVENOUS

## 2017-10-08 MED ORDER — ROCURONIUM BROMIDE 10 MG/ML (PF) SYRINGE
PREFILLED_SYRINGE | INTRAVENOUS | Status: DC | PRN
  Administered 2017-10-08: 20 mg via INTRAVENOUS
  Administered 2017-10-08: 10 mg via INTRAVENOUS
  Administered 2017-10-08: 20 mg via INTRAVENOUS
  Administered 2017-10-08: 10 mg via INTRAVENOUS
  Administered 2017-10-08: 60 mg via INTRAVENOUS
  Administered 2017-10-08: 20 mg via INTRAVENOUS
  Administered 2017-10-08: 10 mg via INTRAVENOUS

## 2017-10-08 MED ORDER — BUPIVACAINE LIPOSOME 1.3 % IJ SUSP
20.0000 mL | Freq: Once | INTRAMUSCULAR | Status: AC
  Administered 2017-10-08: 20 mL
  Filled 2017-10-08: qty 20

## 2017-10-08 MED ORDER — LACTATED RINGERS IV SOLN
INTRAVENOUS | Status: DC
  Administered 2017-10-08 – 2017-10-12 (×8): via INTRAVENOUS

## 2017-10-08 MED ORDER — MIDAZOLAM HCL 5 MG/5ML IJ SOLN
INTRAMUSCULAR | Status: DC | PRN
  Administered 2017-10-08: 2 mg via INTRAVENOUS

## 2017-10-08 MED ORDER — CARVEDILOL 3.125 MG PO TABS
3.1250 mg | ORAL_TABLET | Freq: Two times a day (BID) | ORAL | Status: DC
  Administered 2017-10-08 – 2017-10-12 (×8): 3.125 mg via ORAL
  Filled 2017-10-08 (×8): qty 1

## 2017-10-08 MED ORDER — LIDOCAINE HCL 2 % IJ SOLN
INTRAMUSCULAR | Status: AC
  Filled 2017-10-08: qty 20

## 2017-10-08 MED ORDER — MIDAZOLAM HCL 2 MG/2ML IJ SOLN
INTRAMUSCULAR | Status: AC
  Filled 2017-10-08: qty 2

## 2017-10-08 MED ORDER — HYDROMORPHONE HCL 1 MG/ML IJ SOLN
0.5000 mg | INTRAMUSCULAR | Status: DC | PRN
  Administered 2017-10-09 – 2017-10-12 (×5): 0.5 mg via INTRAVENOUS
  Filled 2017-10-08 (×5): qty 0.5

## 2017-10-08 MED ORDER — HYDRALAZINE HCL 20 MG/ML IJ SOLN
10.0000 mg | INTRAMUSCULAR | Status: DC | PRN
  Administered 2017-10-08 – 2017-10-10 (×2): 10 mg via INTRAVENOUS
  Filled 2017-10-08 (×2): qty 1

## 2017-10-08 MED ORDER — DIPHENHYDRAMINE HCL 50 MG/ML IJ SOLN: 12.5000 mg | Freq: Four times a day (QID) | INTRAMUSCULAR | Status: DC | PRN

## 2017-10-08 MED ORDER — NEOMYCIN SULFATE 500 MG PO TABS: 1000.0000 mg | ORAL_TABLET | ORAL | Status: DC

## 2017-10-08 MED ORDER — LABETALOL HCL 5 MG/ML IV SOLN
INTRAVENOUS | Status: DC | PRN
  Administered 2017-10-08 (×4): 5 mg via INTRAVENOUS

## 2017-10-08 MED ORDER — SUGAMMADEX SODIUM 200 MG/2ML IV SOLN
INTRAVENOUS | Status: DC | PRN
  Administered 2017-10-08: 200 mg via INTRAVENOUS

## 2017-10-08 MED ORDER — DIPHENHYDRAMINE HCL 12.5 MG/5ML PO ELIX: 12.5000 mg | ORAL_SOLUTION | Freq: Four times a day (QID) | ORAL | Status: DC | PRN

## 2017-10-08 MED ORDER — DEXAMETHASONE SODIUM PHOSPHATE 10 MG/ML IJ SOLN
INTRAMUSCULAR | Status: DC | PRN
  Administered 2017-10-08: 10 mg via INTRAVENOUS

## 2017-10-08 MED ORDER — SODIUM CHLORIDE 0.9 % IR SOLN
Status: DC | PRN
  Administered 2017-10-08: 2000 mL

## 2017-10-08 MED ORDER — PROPOFOL 10 MG/ML IV BOLUS
INTRAVENOUS | Status: DC | PRN
  Administered 2017-10-08: 150 mg via INTRAVENOUS

## 2017-10-08 MED ORDER — GABAPENTIN 300 MG PO CAPS
300.0000 mg | ORAL_CAPSULE | ORAL | Status: AC
  Administered 2017-10-08: 300 mg via ORAL
  Filled 2017-10-08: qty 1

## 2017-10-08 MED ORDER — CHLORHEXIDINE GLUCONATE CLOTH 2 % EX PADS: 6.0000 | MEDICATED_PAD | Freq: Once | CUTANEOUS | Status: DC

## 2017-10-08 MED ORDER — IBUPROFEN 200 MG PO TABS: 600.0000 mg | ORAL_TABLET | Freq: Four times a day (QID) | ORAL | Status: DC | PRN

## 2017-10-08 MED ORDER — ACETAMINOPHEN 500 MG PO TABS
1000.0000 mg | ORAL_TABLET | ORAL | Status: AC
  Administered 2017-10-08: 1000 mg via ORAL
  Filled 2017-10-08: qty 2

## 2017-10-08 MED ORDER — ALBUMIN HUMAN 5 % IV SOLN
INTRAVENOUS | Status: DC | PRN
  Administered 2017-10-08: 15:00:00 via INTRAVENOUS

## 2017-10-08 MED ORDER — ALVIMOPAN 12 MG PO CAPS: 12.0000 mg | ORAL_CAPSULE | Freq: Two times a day (BID) | ORAL | Status: DC

## 2017-10-08 MED ORDER — ENSURE SURGERY PO LIQD
237.0000 mL | Freq: Two times a day (BID) | ORAL | Status: DC
  Administered 2017-10-09 (×2): 237 mL via ORAL
  Filled 2017-10-08 (×8): qty 237

## 2017-10-08 MED ORDER — SODIUM CHLORIDE 0.9 % IV SOLN
2.0000 g | INTRAVENOUS | Status: AC
  Administered 2017-10-08: 2 g via INTRAVENOUS
  Filled 2017-10-08: qty 2

## 2017-10-08 MED ORDER — MEPERIDINE HCL 50 MG/ML IJ SOLN: 6.2500 mg | INTRAMUSCULAR | Status: DC | PRN

## 2017-10-08 MED ORDER — BUPIVACAINE-EPINEPHRINE (PF) 0.5% -1:200000 IJ SOLN
INTRAMUSCULAR | Status: AC
  Filled 2017-10-08: qty 30

## 2017-10-08 MED ORDER — ONDANSETRON HCL 4 MG/2ML IJ SOLN
INTRAMUSCULAR | Status: AC
  Filled 2017-10-08: qty 2

## 2017-10-08 MED ORDER — PROPOFOL 10 MG/ML IV BOLUS
INTRAVENOUS | Status: AC
  Filled 2017-10-08: qty 20

## 2017-10-08 MED ORDER — FENTANYL CITRATE (PF) 100 MCG/2ML IJ SOLN
INTRAMUSCULAR | Status: AC
  Filled 2017-10-08: qty 2

## 2017-10-08 MED ORDER — FENTANYL CITRATE (PF) 100 MCG/2ML IJ SOLN
INTRAMUSCULAR | Status: DC | PRN
  Administered 2017-10-08 (×2): 50 ug via INTRAVENOUS
  Administered 2017-10-08: 100 ug via INTRAVENOUS
  Administered 2017-10-08 (×3): 50 ug via INTRAVENOUS

## 2017-10-08 MED ORDER — SODIUM CHLORIDE 0.9 % IJ SOLN
INTRAMUSCULAR | Status: AC
  Filled 2017-10-08: qty 50

## 2017-10-08 MED ORDER — ONDANSETRON HCL 4 MG/2ML IJ SOLN: 4.0000 mg | Freq: Once | INTRAMUSCULAR | Status: DC | PRN

## 2017-10-08 MED ORDER — ATORVASTATIN CALCIUM 40 MG PO TABS
40.0000 mg | ORAL_TABLET | Freq: Every day | ORAL | Status: DC
  Administered 2017-10-08 – 2017-10-11 (×4): 40 mg via ORAL
  Filled 2017-10-08 (×4): qty 1

## 2017-10-08 MED ORDER — ONDANSETRON HCL 4 MG PO TABS: 4.0000 mg | ORAL_TABLET | Freq: Four times a day (QID) | ORAL | Status: DC | PRN

## 2017-10-08 MED ORDER — ASPIRIN EC 81 MG PO TBEC
81.0000 mg | DELAYED_RELEASE_TABLET | Freq: Every day | ORAL | Status: DC
  Administered 2017-10-10 – 2017-10-12 (×3): 81 mg via ORAL
  Filled 2017-10-08 (×4): qty 1

## 2017-10-08 MED ORDER — HYDROMORPHONE HCL 1 MG/ML IJ SOLN: 0.2500 mg | INTRAMUSCULAR | Status: DC | PRN

## 2017-10-08 MED ORDER — EPHEDRINE SULFATE 50 MG/ML IJ SOLN: INTRAMUSCULAR | Status: DC | PRN

## 2017-10-08 MED ORDER — HEPARIN SODIUM (PORCINE) 5000 UNIT/ML IJ SOLN
5000.0000 [IU] | Freq: Three times a day (TID) | INTRAMUSCULAR | Status: DC
  Administered 2017-10-08 – 2017-10-12 (×11): 5000 [IU] via SUBCUTANEOUS
  Filled 2017-10-08 (×11): qty 1

## 2017-10-08 MED ORDER — SACCHAROMYCES BOULARDII 250 MG PO CAPS
250.0000 mg | ORAL_CAPSULE | Freq: Two times a day (BID) | ORAL | Status: DC
  Administered 2017-10-08 – 2017-10-12 (×8): 250 mg via ORAL
  Filled 2017-10-08 (×8): qty 1

## 2017-10-08 MED ORDER — LACTATED RINGERS IV SOLN
INTRAVENOUS | Status: DC
  Administered 2017-10-08: 15:00:00 via INTRAVENOUS
  Administered 2017-10-08: 1000 mL via INTRAVENOUS
  Administered 2017-10-08: 13:00:00 via INTRAVENOUS

## 2017-10-08 MED ORDER — LISINOPRIL 10 MG PO TABS
10.0000 mg | ORAL_TABLET | Freq: Every day | ORAL | Status: DC
  Administered 2017-10-09: 10 mg via ORAL
  Filled 2017-10-08: qty 1

## 2017-10-08 MED ORDER — HEPARIN SODIUM (PORCINE) 5000 UNIT/ML IJ SOLN
5000.0000 [IU] | Freq: Once | INTRAMUSCULAR | Status: AC
  Administered 2017-10-08: 5000 [IU] via SUBCUTANEOUS
  Filled 2017-10-08: qty 1

## 2017-10-08 MED ORDER — TRAMADOL HCL 50 MG PO TABS
50.0000 mg | ORAL_TABLET | Freq: Four times a day (QID) | ORAL | Status: DC | PRN
  Administered 2017-10-09 – 2017-10-10 (×2): 50 mg via ORAL
  Filled 2017-10-08 (×3): qty 1

## 2017-10-08 MED ORDER — ALVIMOPAN 12 MG PO CAPS
12.0000 mg | ORAL_CAPSULE | ORAL | Status: AC
  Administered 2017-10-08: 12 mg via ORAL
  Filled 2017-10-08: qty 1

## 2017-10-08 MED ORDER — METRONIDAZOLE 500 MG PO TABS: 1000.0000 mg | ORAL_TABLET | ORAL | Status: DC

## 2017-10-08 MED ORDER — POLYETHYLENE GLYCOL 3350 17 GM/SCOOP PO POWD: 1.0000 | Freq: Once | ORAL | Status: DC

## 2017-10-08 MED ORDER — ONDANSETRON HCL 4 MG/2ML IJ SOLN: 4.0000 mg | Freq: Four times a day (QID) | INTRAMUSCULAR | Status: DC | PRN

## 2017-10-08 MED ORDER — PHENYLEPHRINE HCL 10 MG/ML IJ SOLN
INTRAMUSCULAR | Status: DC | PRN
  Administered 2017-10-08 (×4): 80 ug via INTRAVENOUS

## 2017-10-08 MED ORDER — ACETAMINOPHEN 500 MG PO TABS
1000.0000 mg | ORAL_TABLET | Freq: Four times a day (QID) | ORAL | Status: DC
  Administered 2017-10-08 – 2017-10-12 (×15): 1000 mg via ORAL
  Filled 2017-10-08 (×16): qty 2

## 2017-10-08 MED ORDER — SODIUM CHLORIDE 0.9 % IJ SOLN
INTRAMUSCULAR | Status: DC | PRN
  Administered 2017-10-08: 15 mL

## 2017-10-08 MED ORDER — LIDOCAINE HCL (CARDIAC) PF 100 MG/5ML IV SOSY
PREFILLED_SYRINGE | INTRAVENOUS | Status: DC | PRN
  Administered 2017-10-08: 100 mg via INTRAVENOUS

## 2017-10-08 MED ORDER — SUGAMMADEX SODIUM 200 MG/2ML IV SOLN
INTRAVENOUS | Status: AC
  Filled 2017-10-08: qty 2

## 2017-10-08 MED ORDER — ROCURONIUM BROMIDE 10 MG/ML (PF) SYRINGE
PREFILLED_SYRINGE | INTRAVENOUS | Status: AC
  Filled 2017-10-08: qty 10

## 2017-10-08 MED ORDER — FENTANYL CITRATE (PF) 250 MCG/5ML IJ SOLN
INTRAMUSCULAR | Status: AC
  Filled 2017-10-08: qty 5

## 2017-10-08 SURGICAL SUPPLY — 58 items
ADH SKN CLS APL DERMABOND .7 (GAUZE/BANDAGES/DRESSINGS) ×2
APPLIER CLIP ROT 10 11.4 M/L (STAPLE)
APR CLP MED LRG 11.4X10 (STAPLE)
BLADE CLIPPER SURG (BLADE) IMPLANT
CABLE HIGH FREQUENCY MONO STRZ (ELECTRODE) ×8 IMPLANT
CELLS DAT CNTRL 66122 CELL SVR (MISCELLANEOUS) IMPLANT
CHLORAPREP W/TINT 26ML (MISCELLANEOUS) ×2 IMPLANT
CLIP APPLIE ROT 10 11.4 M/L (STAPLE) IMPLANT
DECANTER SPIKE VIAL GLASS SM (MISCELLANEOUS) ×4 IMPLANT
DERMABOND ADVANCED (GAUZE/BANDAGES/DRESSINGS) ×2
DERMABOND ADVANCED .7 DNX12 (GAUZE/BANDAGES/DRESSINGS) IMPLANT
DISSECTOR BLUNT TIP ENDO 5MM (MISCELLANEOUS) IMPLANT
ELECT REM PT RETURN 15FT ADLT (MISCELLANEOUS) ×4 IMPLANT
GAUZE SPONGE 4X4 12PLY STRL (GAUZE/BANDAGES/DRESSINGS) ×2 IMPLANT
GLOVE BIO SURGEON STRL SZ7.5 (GLOVE) ×8 IMPLANT
GLOVE ECLIPSE 8.0 STRL XLNG CF (GLOVE) ×8 IMPLANT
GOWN STRL REUS W/TWL XL LVL3 (GOWN DISPOSABLE) ×16 IMPLANT
LIGASURE IMPACT 36 18CM CVD LR (INSTRUMENTS) IMPLANT
PACK COLON (CUSTOM PROCEDURE TRAY) ×4 IMPLANT
PAD POSITIONING PINK XL (MISCELLANEOUS) ×2 IMPLANT
PORT LAP GEL ALEXIS MED 5-9CM (MISCELLANEOUS) ×2 IMPLANT
POSITIONER SURGICAL ARM (MISCELLANEOUS) IMPLANT
RELOAD STAPLE 60 4.1 GRN THCK (STAPLE) IMPLANT
RELOAD STAPLER GREEN 60MM (STAPLE) ×4 IMPLANT
RETRACTOR WND ALEXIS 18 MED (MISCELLANEOUS) IMPLANT
RTRCTR WOUND ALEXIS 18CM MED (MISCELLANEOUS)
SCISSORS LAP 5X35 DISP (ENDOMECHANICALS) ×4 IMPLANT
SEALER TISSUE G2 STRG ARTC 35C (ENDOMECHANICALS) ×2 IMPLANT
SET IRRIG TUBING LAPAROSCOPIC (IRRIGATION / IRRIGATOR) ×2 IMPLANT
SHEARS HARMONIC ACE PLUS 36CM (ENDOMECHANICALS) IMPLANT
SLEEVE ADV FIXATION 5X100MM (TROCAR) ×8 IMPLANT
SLEEVE ENDOPATH XCEL 5M (ENDOMECHANICALS) ×2 IMPLANT
STAPLER ECHELON LONG 60 440 (INSTRUMENTS) ×2 IMPLANT
STAPLER RELOAD GREEN 60MM (STAPLE) ×8
STAPLER TA28 THK CONTR EEA XL (STAPLE) ×2 IMPLANT
STAPLER VISISTAT 35W (STAPLE) ×4 IMPLANT
SUT MNCRL AB 4-0 PS2 18 (SUTURE) ×4 IMPLANT
SUT PDS AB 1 CT1 27 (SUTURE) ×8 IMPLANT
SUT PROLENE 2 0 CT2 30 (SUTURE) IMPLANT
SUT PROLENE 2 0 KS (SUTURE) ×4 IMPLANT
SUT SILK 2 0 (SUTURE) ×4
SUT SILK 2 0 SH CR/8 (SUTURE) ×4 IMPLANT
SUT SILK 2-0 18XBRD TIE 12 (SUTURE) ×2 IMPLANT
SUT SILK 3 0 (SUTURE) ×4
SUT SILK 3 0 SH CR/8 (SUTURE) ×6 IMPLANT
SUT SILK 3-0 18XBRD TIE 12 (SUTURE) ×2 IMPLANT
SYS LAPSCP GELPORT 120MM (MISCELLANEOUS)
SYSTEM LAPSCP GELPORT 120MM (MISCELLANEOUS) IMPLANT
TAPE CLOTH 4X10 WHT NS (GAUZE/BANDAGES/DRESSINGS) IMPLANT
TOWEL OR 17X26 10 PK STRL BLUE (TOWEL DISPOSABLE) ×2 IMPLANT
TRAY FOLEY MTR SLVR 16FR STAT (SET/KITS/TRAYS/PACK) ×2 IMPLANT
TROCAR ADV FIXATION 5X100MM (TROCAR) ×4 IMPLANT
TROCAR BALLN 12MMX100 BLUNT (TROCAR) ×4 IMPLANT
TROCAR XCEL NON-BLD 11X100MML (ENDOMECHANICALS) IMPLANT
TUBING CONNECTING 10 (TUBING) ×8 IMPLANT
TUBING CONNECTING 10' (TUBING) ×4
TUBING INSUF HEATED (TUBING) ×4 IMPLANT
YANKAUER SUCT BULB TIP NO VENT (SUCTIONS) ×4 IMPLANT

## 2017-10-08 NOTE — Transfer of Care (Signed)
Immediate Anesthesia Transfer of Care Note  Patient: Albert Morris  Procedure(s) Performed: LAPAROSCOPIC VERSES OPEN LEFT HEMI COLECTOMY ERAS PATHWAY (Left Abdomen) FLEXIBLE SIGMOIDOSCOPY (N/A )  Patient Location: PACU  Anesthesia Type:General  Level of Consciousness: sedated  Airway & Oxygen Therapy: Patient Spontanous Breathing and Patient connected to face mask oxygen  Post-op Assessment: Report given to RN and Post -op Vital signs reviewed and stable  Post vital signs: Reviewed and stable  Last Vitals:  Vitals Value Taken Time  BP    Temp 36.6 C 10/08/2017  4:55 PM  Pulse 75 10/08/2017  4:55 PM  Resp 18 10/08/2017  4:55 PM  SpO2 100 % 10/08/2017  4:54 PM  Vitals shown include unvalidated device data.  Last Pain:  Vitals:   10/08/17 0913  TempSrc:   PainSc: 0-No pain      Patients Stated Pain Goal: 4 (27/74/12 8786)  Complications: No apparent anesthesia complications

## 2017-10-08 NOTE — Anesthesia Postprocedure Evaluation (Signed)
Anesthesia Post Note  Patient: Albert Morris  Procedure(s) Performed: LAPAROSCOPIC VERSES OPEN LEFT HEMI COLECTOMY ERAS PATHWAY (Left Abdomen) FLEXIBLE SIGMOIDOSCOPY (N/A )     Patient location during evaluation: PACU Anesthesia Type: General Level of consciousness: awake and alert Pain management: pain level controlled Vital Signs Assessment: post-procedure vital signs reviewed and stable Respiratory status: spontaneous breathing, nonlabored ventilation, respiratory function stable and patient connected to nasal cannula oxygen Cardiovascular status: blood pressure returned to baseline and stable Postop Assessment: no apparent nausea or vomiting Anesthetic complications: no    Last Vitals:  Vitals:   10/08/17 1740 10/08/17 1847  BP: (!) 168/96 (!) 187/101  Pulse: 77   Resp: 18 16  Temp: 36.6 C 36.7 C  SpO2: 97% 99%    Last Pain:  Vitals:   10/08/17 1847  TempSrc: Oral  PainSc:                  [name]

## 2017-10-08 NOTE — Anesthesia Preprocedure Evaluation (Addendum)
Anesthesia Evaluation  Patient identified by MRN, date of birth, ID band Patient awake    Reviewed: Allergy & Precautions, NPO status , Patient's Chart, lab work & pertinent test results  Airway Mallampati: I  TM Distance: >3 FB Neck ROM: Full    Dental   Pulmonary former smoker,    Pulmonary exam normal        Cardiovascular hypertension, Pt. on medications + CAD and + Past MI  Normal cardiovascular exam     Neuro/Psych CVA    GI/Hepatic   Endo/Other    Renal/GU      Musculoskeletal   Abdominal   Peds  Hematology   Anesthesia Other Findings   Reproductive/Obstetrics                             Anesthesia Physical Anesthesia Plan  ASA: III  Anesthesia Plan: General   Post-op Pain Management:    Induction: Intravenous  PONV Risk Score and Plan: 2 and Ondansetron and Treatment may vary due to age or medical condition  Airway Management Planned: Oral ETT  Additional Equipment:   Intra-op Plan:   Post-operative Plan: Extubation in OR  Informed Consent: I have reviewed the patients History and Physical, chart, labs and discussed the procedure including the risks, benefits and alternatives for the proposed anesthesia with the patient or authorized representative who has indicated his/her understanding and acceptance.     Plan Discussed with: CRNA and Surgeon  Anesthesia Plan Comments: (Pt requests not to receive any blood products even if he would die. Discussed in front of the patients wife and concurred with Dr  Dema Severin.)       Anesthesia Quick Evaluation

## 2017-10-08 NOTE — Op Note (Signed)
PATIENT: Albert Morris  67 y.o. male  Patient Care Team: Albert Commons, MD as PCP - General (Cardiology)  PREOP DIAGNOSIS: Colon polyp  POSTOP DIAGNOSIS: Colon polyp  PROCEDURE:  1. Laparoscopic extended left hemicolectomy 2. Flexible sigmoidoscopy 3. Bilateral transversus abdominis plane (TAP) blocks  SURGEON: Sharon Mt. Dema Severin, MD  ASSISTANT: Leighton Ruff, MD  ANESTHESIA: General endotracheal  EBL: 100cc Total I/O In: 2550 [I.V.:2300; IV Piggyback:250] Out: 505 [Urine:405; Blood:100]  DRAINS: None  SPECIMEN:  1. Left colon (distal transverse, descending and sigmoid colon) 2. Additional transverse colon  COUNTS: Sponge, needle and instrument counts were reported correct x2  COMPLICATIONS: None  FINDINGS:  No obvious metastatic disease on visceral parietal peritoneum or liver. Multiple adhesions from his childhood surgery requiring laparoscopic adhesiolysis x 30 minutes. Tattoo found in proximal descending colon abutting splenic flexure. Double stapled mid-transverse colon to proximal rectum anastomosis. The anastomosis is located 18 cm from the anal verge by flexible sigmoidoscopy.  STATEMENT OF MEDICAL NECESSITY: Albert Morris is a 67 y.o. male with endoscopically unresectable 5cm polyp in the proximal descending colon which was biopsied by GI and returned tubular adenoma. The polyp had a clip applied but was not tattoo'd. He underwent flexible endoscopy by myself for lesion localization and placement of tattoo on 10/07/17. The polyp was large with a fungating appearance concerning for harboring malignancy. Options were discussed with the patient in the office and he opted to undergo surgical resection. Please refer to H&P for details regarding this discussion.  NARRATIVE:  Informed consent was confirmed. The patient was taken to the operating room, placed supine position on the operating table and SCD's were applied. General endotracheal anesthesia  was induced. The patient was then positioned in the lithotomy position with Allen stirrups. The arms were padded and tucked. The chest was secured to the table with tape. Pressure points were padded and then verified.The patient's abdomen was prepped and draped in the standard sterile fashion. Surgical timeout confirmed our plan.  Abdominal entry was gained using a Hassan technique.  The fascia was incised sharply and peritoneal entry obtained gently, bluntly.  A 0 Vicryl pursestring suture was placed.  He has some cannula was introduced cavity and the balloon inflated.  The abdomen was then insufflated with CO2 to a maximum pressure of 15 mmHg.  The a 30 degree 5 mm laparoscope was inserted.  Circumferential inspection of the abdomen revealed small bowel and omental containing adhesions to the anterior abdominal wall on the left paramedian portion.  Bilateral TAP blocks were then performed using a dilute mixture of Exparel and 0.25% Marcaine with epinephrine.  2 additional 5 mm trochars were placed in the right hemiabdomen approximately 1 handbreadth apart.  One additional 64mm trochar was placed in the left hemiabdomen.  Laparoscopic adhesio lysis then commenced sharply taking down the omentum and small bowel containing adhesions to the anterior abdominal wall.  Additional adhesions were noted in the pelvis between the small bowel in the sigmoid colon as well as the sigmoid colon to the sigmoid colon mesentery.  These were also divided sharply.  Small bowel and colon were inspected and no serosal injuries were identified.  The abdomen was then surveyed and the only pertinent finding being the tattoo located in the proximal descending colon.  The patient was positioned in Trendelenburg and the small bowel swept out of the pelvis.  The rectosigmoid colon was identified.  The rectosigmoid colon was grasped and retracted medially and the peritoneal attachments of the  sigmoid colon to the left abdominal wall were  taken down.  Staying along the Niguel Moure line of Toldt, this plane was developed in the lateral to medial fashion.  The gonadal vessels were identified and medial to this, the left ureter was readily identified.  The structures were protected free of injury.  Dissection was continued cephalad working along the descending colon up to the level of the splenic flexure again taking down the Khallid Pasillas line of Toldt.  The descending colon and descending colon mesentery were mobilized.  Attachments of the splenic flexure were taken down.  Attention was then turned to the proximal portion of the dissection.  The patient was repositioned in reverse Trendelenburg.  The omentum attached to the transverse colon was divided using the Enseal device.  This was done out to the level of the transverse colon.  Care was taken to preserve the transverse mesocolon during this dissection.  The lesser sac was entered and the plane developed working laterally.  The transverse colon was mobilized down to the level of the pancreas and the transverse mesocolon was mobilized off the inferior border the pancreas.  This plane was developed laterally around the splenic flexure.  At this point the entire left colon had been mobilized as well as the distal half of the transverse colon.  The patient was repositioned in Trendelenburg and the rectosigmoid colon again identified.  The IMA pedicle was readily identified working out a bit more distally on the IMA given the location of the polyp, a window was created in the mesentery.  Taking the branches of the IMA with the Enseal was performed after confirming location of the left ureter.  The intervening mesentery of the descending colon was then divided using the Enseal device.  The proximal transverse mesocolon was also divided using the Enseal device.  The IMV was ligated just beyond its takeoff from the inferior border of the pancreas using the Enseal device.  The divided mesentery as well as IMA  branches and IMV pedicle inspected and noted to be hemostatic.  The proximalmost portion of the rectum was identified at the location of the tinea splayed, the appendices epiploica disappeared, and an area overlying the sacral promontory the mesentery was divided out to this level again taking care to preserve the mesorectum out to the level of the proximal rectum.  At this point, attention was turned to division of the rectum.  The camera was placed in 1/5 millimeters trochars and a powered Echelon stapler with a green load was used to divide the proximal rectum/rectosigmoid junction.  The staple line was complete.  Attention was then turned to the extracorporeal portion of the procedure.  Given his habitus, the decision was made to make a periumbilical incision as opposed to a Pfannenstiel incision for extraction.  A 12 mm trocar was removed as was the pursestring suture and the incision widened.  An [place] wound protector was placed.  The specimen was then delivered after the area was draped out with blue towels.  A point along the distal transverse colon was selected. The colon was initially divided and the specimen passed off. As the distal transverse colon was prepared for an anastomosis the lumen was inspected and appeared somewhat pale. A doppler was used to listen to the blood flow in the associated mesentery and no reliable arterial signal could be found. Given these findings, the decision was made to go more proximally. A more proximal point on the transverse colon was therefore selected and at  this level there was a palpable pulse in the mesentery and this was then doubly verified with the doppler. The colon was pink and well perfused. The pursestring device applied and a 2-0 prolene passed through the device the transverse colon was divided and passed off as additional specimen. There was nice bright red oozing from the plane of division which was reassuring as well. The pursestring device was  released and the purse-string reinforced with additional 3-0 silk suture. The lumen was sized with EEA sizers and a 28 mm stapler selected. The anvil was placed and the pursestring tied. The colon was palpated at this level and a small amount of fat cleared from the planned anastomosis. Hemostasis was noted. The colon was placed back in the abdomen and a yellow cap placed. The pneumoperitoneum was re-established. The colon easily reached into the pelvis without tension and did not freely retract.  I then went below for passage of the stapler. The 28 mm Covidien stapler was then passed carefully under direct visualization. The spike was deployed just anterior to the staple line. The components were mated. Orientation was confirmed such that there was no twisting and the small bowel was not underneath the mesentery. The stapler was then closed, held and fired. The stapler was removed. The colon proximal to the anastomosis was occluded. The flexible sigmoidoscope was passed and insufflation commenced. The anastomosis was widely patent, hemostatic and air tight. From the abdominal side there was no tension. Omentum was placed over the anastomosis. The scope was removed. The irrigation was evacuated and hemostasis confirmed in the abdomen. The ports were then removed under direct visualization and hemostatic. The alexis wound protector was removed.  Attention was turned to closing the abdomen. Clean instruments, gowns/gloves and equipment was utilized. The operative field was draped with additional sterile drapes. The midline fascia was closed with two running #1 PDS sutures. The wound was irrigated. Additional Exparel/Marcaine was infiltrated into the fascia at this level. The midline extraction wound was irrigated with sterile saline. The skin incisions were all closed with 4-0 Monocryl subcuticular suture and dermabond applied. The patient was then awakened from anesthesia, extubated, and transferred to a  stretcher for transport in satisfactory condition.

## 2017-10-08 NOTE — H&P (Signed)
H&P Update  H&P from yesterday, 8/21, reviewed by myself with the patient. He reports no interval changes. He denies any complaints today including f/c/n/v/abdominal pain. He reports being ready for his procedure today.  Vitals:   10/08/17 0855 10/08/17 0913  BP: 136/85   Pulse: 72   Resp: 20   Temp: 98.5 F (36.9 C)   TempSrc: Oral   SpO2: 99%   Weight:  99.3 kg  Height:  6\' 3"  (1.905 m)   Gen: NAD, comfortable CV: RRR Pulm: Normal work of breathing GI: Abdomen soft, NT/ND Ext: No pitting edema, SCDs in place  Albert Morris is a very pleasant 40yoM with hx HTN, HLD, CAD, PAD, CVA presents as a referral by Dr. Laural Golden for an endoscopically unresectable polyp presumably in his descending colon  -We discussed options going forward including nonsurgical which runs a risk of this degenerating and cancer who has already and subsequent bowel obstruction. We discussed the operative options well and he is opted to pursue this route. -Will schedule for colonoscopy the day prior to his procedure so he does not have to take 2 preps. The plan will be for him to remain on clear liquids after colonoscopy as per the ERAS pathway -Will plan laparoscopic vs open left hemicolectomy assuming tattoo is in the descending colon (this may additionally include need for extended left colectomy vs subtotal colectomy), probable takedown of splenic flexure; flexible sigmoidoscopy to check anastomosis; possible stoma -He reports last taking plavix 6 days ago -He reports that he has declined any blood transfusions, even if his life depended on it, recognizing that without it he would not survive. -The anatomy and physiology of the GI tract was discussed at length with the patient again today. The pathophysiology of colon polyps and cancer was discussed at length again today as well. -The planned procedure, material risks (including, but not limited to, pain, bleeding which could be life threatening or fatal without  transfusion, infection, scarring, damage to surrounding structures- blood vessels/nerves/viscus/organs, damage to ureter, urine leak, leak from anastomosis, need for additional procedures, need for stoma which may be permanent, hernia, recurrence, worsening of pre-existing medical problems, pneumonia, heart attack, stroke, death) benefits and alternatives to surgery were discussed at length. The patient's questions were answered to his satisfaction, he voiced understanding and elected to proceed with surgery. Additionally, we discussed typical postoperative expectations and the recovery process

## 2017-10-08 NOTE — Anesthesia Procedure Notes (Signed)
Procedure Name: Intubation Date/Time: 10/08/2017 12:23 PM Performed by: Glory Buff, CRNA Pre-anesthesia Checklist: Patient identified, Emergency Drugs available, Suction available and Patient being monitored Patient Re-evaluated:Patient Re-evaluated prior to induction Oxygen Delivery Method: Circle system utilized Preoxygenation: Pre-oxygenation with 100% oxygen Induction Type: IV induction Ventilation: Mask ventilation without difficulty Laryngoscope Size: Miller and 3 Grade View: Grade III Tube type: Oral Tube size: 7.5 mm Number of attempts: 1 Airway Equipment and Method: Stylet and Oral airway Placement Confirmation: ETT inserted through vocal cords under direct vision,  positive ETCO2 and breath sounds checked- equal and bilateral Secured at: 22 cm Tube secured with: Tape Dental Injury: Teeth and Oropharynx as per pre-operative assessment  Difficulty Due To: Difficult Airway- due to anterior larynx

## 2017-10-09 ENCOUNTER — Encounter (HOSPITAL_COMMUNITY): Payer: Self-pay | Admitting: Surgery

## 2017-10-09 LAB — BASIC METABOLIC PANEL
Anion gap: 9 (ref 5–15)
BUN: 13 mg/dL (ref 8–23)
CHLORIDE: 102 mmol/L (ref 98–111)
CO2: 25 mmol/L (ref 22–32)
CREATININE: 1.35 mg/dL — AB (ref 0.61–1.24)
Calcium: 8.6 mg/dL — ABNORMAL LOW (ref 8.9–10.3)
GFR calc Af Amer: >60 mL/min (ref >60)
GFR calc non Af Amer: 52 mL/min — ABNORMAL LOW (ref >60)
GLUCOSE: 167 mg/dL — AB (ref 70–99)
Potassium: 4.6 mmol/L (ref 3.5–5.1)
Sodium: 136 mmol/L (ref 135–145)

## 2017-10-09 LAB — MAGNESIUM: Magnesium: 1.7 mg/dL (ref 1.7–2.4)

## 2017-10-09 LAB — CBC
HEMATOCRIT: 33.1 % — AB (ref 39.0–52.0)
Hemoglobin: 11.2 g/dL — ABNORMAL LOW (ref 13.0–17.0)
MCH: 29.8 pg (ref 26.0–34.0)
MCHC: 33.8 g/dL (ref 30.0–36.0)
MCV: 88 fL (ref 78.0–100.0)
Platelets: 257 10*3/uL (ref 150–400)
RBC: 3.76 MIL/uL — ABNORMAL LOW (ref 4.22–5.81)
RDW: 13.2 % (ref 11.5–15.5)
WBC: 12.9 10*3/uL — ABNORMAL HIGH (ref 4.0–10.5)

## 2017-10-09 LAB — PHOSPHORUS: Phosphorus: 3.4 mg/dL (ref 2.5–4.6)

## 2017-10-09 MED ORDER — AMLODIPINE BESYLATE 10 MG PO TABS
10.0000 mg | ORAL_TABLET | Freq: Every day | ORAL | Status: DC
  Administered 2017-10-10 – 2017-10-12 (×3): 10 mg via ORAL
  Filled 2017-10-09 (×3): qty 1

## 2017-10-09 MED ORDER — MAGNESIUM OXIDE 400 (241.3 MG) MG PO TABS
400.0000 mg | ORAL_TABLET | Freq: Once | ORAL | Status: AC
  Administered 2017-10-09: 400 mg via ORAL
  Filled 2017-10-09 (×2): qty 1

## 2017-10-09 MED ORDER — TRAMADOL HCL 50 MG PO TABS: 50.0000 mg | ORAL_TABLET | Freq: Four times a day (QID) | ORAL | 0 refills | Status: DC | PRN

## 2017-10-09 NOTE — Progress Notes (Signed)
Pharmacy Brief Note - Alvimopan (Entereg)  The standing order set for alvimopan (Entereg) now includes an automatic order to discontinue the drug after the patient has had a bowel movement.  The change was approved by the [place] and the Medical Executive Committee.    This patient has had a bowel movement documented by nursing.  Therefore, alvimopan has been discontinued.  If there are questions, please contact the pharmacy at [phone].  Thank you  Gretta Arab PharmD, BCPS Pager [phone] 10/09/2017 12:43 PM

## 2017-10-09 NOTE — Progress Notes (Signed)
Subjective No acute events. Doing well this morning - up in chair. Reports 3 BM since surgery - no blood in stool. Denies n/v but does feel somewhat bloated. Tolerating clear liquids without issue. Denies signficant abd pain - soreness around extraction site incision. Foley removed, voiding spontaneously without issue.  Objective: Vital signs in last 24 hours: Temp:  [97.6 F (36.4 C)-99.2 F (37.3 C)] 98.7 F (37.1 C) (08/23 0602) Pulse Rate:  [75-100] 76 (08/23 0734) Resp:  [15-18] 16 (08/23 0734) BP: (140-187)/(77-101) 150/83 (08/23 0734) SpO2:  [97 %-100 %] 97 % (08/23 0734) Weight:  [98.9 kg] 98.9 kg (08/23 0602) Last BM Date: 10/09/17  Intake/Output from previous day: 08/22 0701 - 08/23 0700 In: 3510.2 [P.O.:240; I.V.:3020.2; IV Piggyback:250] Out: 2255 [Urine:2155; Blood:100] Intake/Output this shift: No intake/output data recorded.  Gen: NAD, comfortable CV: RRR Pulm: Normal work of breathing Abd: Soft, NT; mildly distended. Incisions c/d/i without erythema, some ecchymoses to periumbilical extraction site Ext: SCDs in place  Lab Results: CBC  Recent Labs    10/09/17 0413  WBC 12.9*  HGB 11.2*  HCT 33.1*  PLT 257   BMET Recent Labs    10/09/17 0413  NA 136  K 4.6  CL 102  CO2 25  GLUCOSE 167*  BUN 13  CREATININE 1.35*  CALCIUM 8.6*   PT/INR No results for input(s): LABPROT, INR in the last 72 hours. ABG No results for input(s): PHART, HCO3 in the last 72 hours.  Invalid input(s): PCO2, PO2  Studies/Results:  Anti-infectives: Anti-infectives (From admission, onward)   Start     Dose/Rate Route Frequency Ordered Stop   10/08/17 1400  neomycin (MYCIFRADIN) tablet 1,000 mg  Status:  Discontinued     1,000 mg Oral 3 times per day 10/08/17 0848 10/08/17 0849   10/08/17 1400  metroNIDAZOLE (FLAGYL) tablet 1,000 mg  Status:  Discontinued     1,000 mg Oral 3 times per day 10/08/17 0848 10/08/17 0849   10/08/17 0900  cefoTEtan (CEFOTAN) 2 g in sodium  chloride 0.9 % 100 mL IVPB     2 g 200 mL/hr over 30 Minutes Intravenous On call to O.R. 10/08/17 0848 10/08/17 1256       Assessment/Plan: Patient Active Problem List   Diagnosis Date Noted  . Colon polyp 10/08/2017  . Positive colorectal cancer screening using Cologuard test 05/28/2017  . Vitamin D deficiency 10/14/2016  . History of carotid endarterectomy 10/14/2016  . Left carotid artery stenosis 08/01/2016  . History of MI (myocardial infarction) 07/08/2016  . CAD in native artery 07/08/2016  . Pre-diabetes 07/08/2016  . History of substance abuse 07/08/2016  . Essential hypertension 07/07/2016  . Dyslipidemia 07/07/2016  . Dizziness   . Stenosis of left carotid artery   . Cardiomyopathy ([place])   . 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 Gappa is a very pleasant [phone] with hx of endoscopically unresectable 5+ cm polyp in the proximal descending colon  -Recovering well  -Advance to full liquids -AKI - adequate UOP; trend BMP daily; continue MIVF today; D/C NSAIDs -Hypomagnesemia, goal of 2.0 - replace with PO mag ox tabs today -HTN - stopping lisinopril until AKI resolves - starting amlodipine 10mg , monitor. -Ambulate 5x/day; IS 10x/hr while awake -PPx: SQH, SCDs -Dispo: Pending toleration of regular diet, pain controlled on po analgesics, recovery of renal fxn, continued bowel activity.   LOS: 1 day   Sharon Mt. Dema Severin, M.D. General and Colorectal Surgery [facility]

## 2017-10-09 NOTE — Addendum Note (Signed)
Addendum  created 10/09/17 0612 by Lollie Sails, CRNA   Charge Capture section accepted

## 2017-10-09 NOTE — Discharge Instructions (Signed)
POST OP INSTRUCTIONS  DIET: As tolerated. Avoid raw/uncooked fruits and vegetables until 3-4 weeks out from surgery. Make sure you chew your food well  1. Take your usually prescribed home medications unless otherwise directed.  2. PAIN CONTROL: a. Pain is best controlled by a usual combination of three different methods TOGETHER: i. Ice/Heat ii. Over the counter pain medication iii. Prescription pain medication b. Most patients will experience some swelling and bruising around the surgical site.  Ice packs or heating pads (30-60 minutes up to 6 times a day) will help. Some people prefer to use ice alone, heat alone, alternating between ice & heat.  Experiment to what works for you.  Swelling and bruising can take several weeks to resolve.   c. It is helpful to take an over-the-counter pain medication regularly for the first few weeks: i. Acetaminophen (Tylenol) - you may take 650mg  every 6 hours as needed. You can take this with motrin as they act differently on the body. If you are taking a narcotic pain medication that has acetaminophen in it, do not take over the counter tylenol at the same time. d. A  prescription for pain medication should be given to you upon discharge.  Take your pain medication as prescribed if your pain is not adequatly controlled with the over-the-counter pain reliefs mentioned above.  3. Avoid getting constipated.  Between the surgery and the pain medications, it is common to experience some constipation.  Increasing fluid intake and taking a fiber supplement (such as Metamucil, Citrucel, FiberCon, MiraLax, etc) 1-2 times a day regularly will usually help prevent this problem from occurring.  A mild laxative (prune juice, Milk of Magnesia, MiraLax, etc) should be taken according to package directions if there are no bowel movements after 48 hours.    4. Dressing: Your incisions are covered in Dermabond which is like sterile superglue for the skin. This will come off on  it's own in a couple weeks. It is waterproof and you may bathe normally starting the day after your surgery in a shower. Avoid baths/pools/lakes/oceans until your wounds have fully healed.  5. ACTIVITIES as tolerated:   a. Avoid heavy lifting (>10lbs or 1 gallon of milk) for the next 6 weeks. b. You may resume regular (light) daily activities beginning the next day--such as daily self-care, walking, climbing stairs--gradually increasing activities as tolerated.  If you can walk 30 minutes without difficulty, it is safe to try more intense activity such as jogging, treadmill, bicycling, low-impact aerobics.  c. DO NOT PUSH THROUGH PAIN.  Let pain be your guide: If it hurts to do something, don't do it. d. Albert Morris may drive when you are no longer taking prescription pain medication, you can comfortably wear a seatbelt, and you can safely maneuver your car and apply brakes. e. Albert Morris may have sexual intercourse when it is comfortable.   6. FOLLOW UP in our office a. Please call [facility] at [phone] to set up an appointment to see your surgeon in the office for a follow-up appointment approximately 2 weeks after your surgery. b. Make sure that you call for this appointment the day you arrive home to insure a convenient appointment time.  9. If you have disability or family leave forms that need to be completed, you may have them completed by your primary care physician's office; for return to work instructions, please ask our office staff and they will be happy to assist you in obtaining this documentation   When to call  us [phone]: 1. Poor pain control 2. Reactions / problems with new medications (rash/itching, etc)  3. Fever over 101.5 F (38.5 C) 4. Inability to urinate 5. Nausea/vomiting 6. Worsening swelling or bruising 7. Continued bleeding from incision. 8. Increased pain, redness, or drainage from the incision  The clinic staff is available to answer your questions during regular  business hours (8:30am-5pm).  Please dont hesitate to call and ask to speak to one of our nurses for clinical concerns.   A surgeon from [facility] is always on call at the hospitals   If you have a medical emergency, go to the nearest emergency room or call 911.  [facility], [place] [address] [place], [place], [place]  [address] MAIN: [phone] FAX: [phone] [email or web address]

## 2017-10-10 LAB — BASIC METABOLIC PANEL
ANION GAP: 8 (ref 5–15)
BUN: 12 mg/dL (ref 8–23)
CALCIUM: 8.8 mg/dL — AB (ref 8.9–10.3)
CHLORIDE: 101 mmol/L (ref 98–111)
CO2: 28 mmol/L (ref 22–32)
Creatinine, Ser: 1.12 mg/dL (ref 0.61–1.24)
GFR calc non Af Amer: >60 mL/min (ref >60)
Glucose, Bld: 113 mg/dL — ABNORMAL HIGH (ref 70–99)
Potassium: 3.9 mmol/L (ref 3.5–5.1)
Sodium: 137 mmol/L (ref 135–145)

## 2017-10-10 LAB — CBC
HCT: 33.8 % — ABNORMAL LOW (ref 39.0–52.0)
HEMOGLOBIN: 11.2 g/dL — AB (ref 13.0–17.0)
MCH: 29.4 pg (ref 26.0–34.0)
MCHC: 33.1 g/dL (ref 30.0–36.0)
MCV: 88.7 fL (ref 78.0–100.0)
Platelets: 271 10*3/uL (ref 150–400)
RBC: 3.81 MIL/uL — AB (ref 4.22–5.81)
RDW: 13.4 % (ref 11.5–15.5)
WBC: 9.9 10*3/uL (ref 4.0–10.5)

## 2017-10-10 LAB — MAGNESIUM: Magnesium: 1.8 mg/dL (ref 1.7–2.4)

## 2017-10-10 LAB — PHOSPHORUS: Phosphorus: 2.6 mg/dL (ref 2.5–4.6)

## 2017-10-10 NOTE — Progress Notes (Signed)
  [facility] Office:  [phone] General Surgery Progress Note   LOS: 2 days  POD -  2 Days Post-Op  Chief Complaint: Colonic polyp  Assessment and Plan: 1.  LAPAROSCOPIC Extended LEFT HEMI COLECTOMY,  FLEXIBLE SIGMOIDOSCOPY - 10/08/2017 - [name]  Hgb - 11.2 - 10/10/2017  Feels bloated, will leave on clear liquids for now  2.  Creatinine - 1.12 - 10/10/2017  3.  Not Jehovah's Witness, but refuses blood  4.  DVT prophylaxis - SQ Heparin   Active Problems:   Colon polyp  Subjective:  Yesterday afternoon, started feeling bloated.  Has not had much po since then.  No much out rectum.  But not vomiting either.  Objective:   Vitals:   10/09/17 2135 10/10/17 0429  BP: (!) 169/96 (!) 167/92  Pulse: 74 75  Resp: 16 18  Temp: 99.1 F (37.3 C) 98.9 F (37.2 C)  SpO2: 96% 98%     Intake/Output from previous day:  08/23 0701 - 08/24 0700 In: 2656.7 [P.O.:520; I.V.:2136.7] Out: 1975 [KYHCW:2376]  Intake/Output this shift:  No intake/output data recorded.   Physical Exam:   General: WN AA M who is alert and oriented.    HEENT: Normal. Pupils equal. .   Lungs: Clear    IS + 1,200 cc   Abdomen: Mild distention.  But has bowel sounds.  No localized tenderness other than at wound   Wound: Clean   Lab Results:    Recent Labs    10/09/17 0413 10/10/17 0518  WBC 12.9* 9.9  HGB 11.2* 11.2*  HCT 33.1* 33.8*  PLT 257 271    BMET   Recent Labs    10/09/17 0413 10/10/17 0518  NA 136 137  K 4.6 3.9  CL 102 101  CO2 25 28  GLUCOSE 167* 113*  BUN 13 12  CREATININE 1.35* 1.12  CALCIUM 8.6* 8.8*    PT/INR  No results for input(s): LABPROT, INR in the last 72 hours.  ABG  No results for input(s): PHART, HCO3 in the last 72 hours.  Invalid input(s): PCO2, PO2   Studies/Results:  No results found.   Anti-infectives:   Anti-infectives (From admission, onward)   Start     Dose/Rate Route Frequency Ordered Stop   10/08/17 1400  neomycin  (MYCIFRADIN) tablet 1,000 mg  Status:  Discontinued     1,000 mg Oral 3 times per day 10/08/17 0848 10/08/17 0849   10/08/17 1400  metroNIDAZOLE (FLAGYL) tablet 1,000 mg  Status:  Discontinued     1,000 mg Oral 3 times per day 10/08/17 0848 10/08/17 0849   10/08/17 0900  cefoTEtan (CEFOTAN) 2 g in sodium chloride 0.9 % 100 mL IVPB     2 g 200 mL/hr over 30 Minutes Intravenous On call to O.R. 10/08/17 0848 10/08/17 1256      Alphonsa Overall, MD, FACS Pager: [facility] Office: [phone] 10/10/2017

## 2017-10-11 LAB — CBC
HCT: 31.9 % — ABNORMAL LOW (ref 39.0–52.0)
HEMOGLOBIN: 10.8 g/dL — AB (ref 13.0–17.0)
MCH: 29.7 pg (ref 26.0–34.0)
MCHC: 33.9 g/dL (ref 30.0–36.0)
MCV: 87.6 fL (ref 78.0–100.0)
Platelets: 247 10*3/uL (ref 150–400)
RBC: 3.64 MIL/uL — ABNORMAL LOW (ref 4.22–5.81)
RDW: 13 % (ref 11.5–15.5)
WBC: 7.8 10*3/uL (ref 4.0–10.5)

## 2017-10-11 LAB — BASIC METABOLIC PANEL
ANION GAP: 9 (ref 5–15)
BUN: 9 mg/dL (ref 8–23)
CHLORIDE: 100 mmol/L (ref 98–111)
CO2: 27 mmol/L (ref 22–32)
Calcium: 8.7 mg/dL — ABNORMAL LOW (ref 8.9–10.3)
Creatinine, Ser: 0.96 mg/dL (ref 0.61–1.24)
GFR calc Af Amer: >60 mL/min (ref >60)
Glucose, Bld: 112 mg/dL — ABNORMAL HIGH (ref 70–99)
Potassium: 3.9 mmol/L (ref 3.5–5.1)
SODIUM: 136 mmol/L (ref 135–145)

## 2017-10-11 LAB — MAGNESIUM: Magnesium: 1.7 mg/dL (ref 1.7–2.4)

## 2017-10-11 LAB — PHOSPHORUS: PHOSPHORUS: 3.2 mg/dL (ref 2.5–4.6)

## 2017-10-11 NOTE — Progress Notes (Signed)
     Assessment & Plan: POD#3 LAPAROSCOPIC Extended LEFT HEMI COLECTOMY,  FLEXIBLE SIGMOIDOSCOPY - 10/08/2017 - [name]  Tolerating clear liquids - will advance to full liquid diet today  Passing flatus, no BM yet  Encouraged OOB, ambulation  Path results still pending         Earnstine Regal, MD, [facility]       Office: [phone]   Chief Complaint: Colon polyp  Subjective: Patient in bed, no complaints.  Tolerating clear liquids, passing flatus, no BM.  Objective: Vital signs in last 24 hours: Temp:  [98.8 F (37.1 C)-99.4 F (37.4 C)] 98.8 F (37.1 C) (08/25 0616) Pulse Rate:  [78-85] 78 (08/25 0616) Resp:  [15-18] 18 (08/25 0616) BP: (155-165)/(80-104) 163/91 (08/25 0616) SpO2:  [93 %-95 %] 93 % (08/25 0616) Weight:  [97.7 kg] 97.7 kg (08/25 0500) Last BM Date: 10/10/17  Intake/Output from previous day: 08/24 0701 - 08/25 0700 In: 2978.8 [P.O.:1200; I.V.:1778.8] Out: 2226 [Urine:2225; Stool:1] Intake/Output this shift: No intake/output data recorded.  Physical Exam: HEENT - sclerae clear, mucous membranes moist Neck - soft Chest - clear bilaterally Cor - RRR Abdomen - soft, mild distension; active BS present; wounds dry and intact Ext - no edema, non-tender Neuro - alert & oriented, no focal deficits  Lab Results:  Recent Labs    10/10/17 0518 10/11/17 0455  WBC 9.9 7.8  HGB 11.2* 10.8*  HCT 33.8* 31.9*  PLT 271 247   BMET Recent Labs    10/10/17 0518 10/11/17 0455  NA 137 136  K 3.9 3.9  CL 101 100  CO2 28 27  GLUCOSE 113* 112*  BUN 12 9  CREATININE 1.12 0.96  CALCIUM 8.8* 8.7*   PT/INR No results for input(s): LABPROT, INR in the last 72 hours. Comprehensive Metabolic Panel:    Component Value Date/Time   NA 136 10/11/2017 0455   NA 137 10/10/2017 0518   K 3.9 10/11/2017 0455   K 3.9 10/10/2017 0518   CL 100 10/11/2017 0455   CL 101 10/10/2017 0518   CO2 27 10/11/2017 0455   CO2 28  10/10/2017 0518   BUN 9 10/11/2017 0455   BUN 12 10/10/2017 0518   CREATININE 0.96 10/11/2017 0455   CREATININE 1.12 10/10/2017 0518   CREATININE 1.17 09/17/2016 0720   GLUCOSE 112 (H) 10/11/2017 0455   GLUCOSE 113 (H) 10/10/2017 0518   CALCIUM 8.7 (L) 10/11/2017 0455   CALCIUM 8.8 (L) 10/10/2017 0518   AST 25 09/18/2017 1130   AST 17 09/17/2016 0720   ALT 27 09/18/2017 1130   ALT 17 09/17/2016 0720   ALKPHOS 74 09/18/2017 1130   ALKPHOS 75 09/17/2016 0720   BILITOT 0.4 09/18/2017 1130   BILITOT 0.5 09/17/2016 0720   PROT 7.4 09/18/2017 1130   PROT 7.0 09/17/2016 0720   ALBUMIN 4.1 09/18/2017 1130   ALBUMIN 4.0 09/17/2016 0720    Studies/Results: No results found.    Albert Morris M 10/11/2017  Patient ID: Albert Morris, male   DOB: November 10, 1950, 67 y.o.   MRN: [id number]

## 2017-10-11 NOTE — Plan of Care (Signed)
Pt remains stable with no needs. No changes to note. No changes needed to care plans.

## 2017-10-12 LAB — BASIC METABOLIC PANEL
ANION GAP: 11 (ref 5–15)
BUN: 7 mg/dL — ABNORMAL LOW (ref 8–23)
CO2: 27 mmol/L (ref 22–32)
Calcium: 8.9 mg/dL (ref 8.9–10.3)
Chloride: 100 mmol/L (ref 98–111)
Creatinine, Ser: 1.02 mg/dL (ref 0.61–1.24)
GFR calc Af Amer: >60 mL/min (ref >60)
GFR calc non Af Amer: >60 mL/min (ref >60)
GLUCOSE: 118 mg/dL — AB (ref 70–99)
POTASSIUM: 3.8 mmol/L (ref 3.5–5.1)
Sodium: 138 mmol/L (ref 135–145)

## 2017-10-12 LAB — CBC
HEMATOCRIT: 33.8 % — AB (ref 39.0–52.0)
HEMOGLOBIN: 11.4 g/dL — AB (ref 13.0–17.0)
MCH: 29.4 pg (ref 26.0–34.0)
MCHC: 33.7 g/dL (ref 30.0–36.0)
MCV: 87.1 fL (ref 78.0–100.0)
Platelets: 310 10*3/uL (ref 150–400)
RBC: 3.88 MIL/uL — ABNORMAL LOW (ref 4.22–5.81)
RDW: 13.1 % (ref 11.5–15.5)
WBC: 6.8 10*3/uL (ref 4.0–10.5)

## 2017-10-12 LAB — MAGNESIUM: Magnesium: 1.9 mg/dL (ref 1.7–2.4)

## 2017-10-12 LAB — PHOSPHORUS: Phosphorus: 3.9 mg/dL (ref 2.5–4.6)

## 2017-10-12 MED ORDER — TRAMADOL HCL 50 MG PO TABS: 50.0000 mg | ORAL_TABLET | Freq: Four times a day (QID) | ORAL | 0 refills | Status: AC | PRN

## 2017-10-12 NOTE — Progress Notes (Signed)
     Assessment & Plan: POD#4 LAPAROSCOPIC Extended LEFT HEMI COLECTOMY,  FLEXIBLE SIGMOIDOSCOPY - 10/08/2017 - [name]  Tolerating full liquids - will advance to soft foods diet today  Passing flatus and having BM's  Encouraged OOB, PO pain meds, ambulation  Path results still pending     Albert Adie, MD  Colorectal and General Surgery [facility]    Chief Complaint: Colon polyp  Subjective: Patient in bed, no complaints.  Tolerating full liquids, passing flatus, having BM's.  Objective: Vital signs in last 24 hours: Temp:  [98.2 F (36.8 C)-98.6 F (37 C)] 98.6 F (37 C) (08/26 0512) Pulse Rate:  [69-85] 69 (08/26 0512) Resp:  [16-18] 16 (08/26 0512) BP: (140-148)/(80-87) 140/80 (08/26 0512) SpO2:  [97 %] 97 % (08/26 0512) Weight:  [97.6 kg] 97.6 kg (08/26 0512) Last BM Date: 10/12/17  Intake/Output from previous day: 08/25 0701 - 08/26 0700 In: 2421.3 [P.O.:600; I.V.:1821.3] Out: 1250 [Urine:1250] Intake/Output this shift: Total I/O In: 30 [P.O.:30] Out: 300 [Urine:300]  Physical Exam: HEENT - sclerae clear, mucous membranes moist Neck - soft Chest - clear bilaterally Cor - RRR Abdomen - soft, min distension; wounds dry and intact Ext - no edema, non-tender Neuro - alert & oriented, no focal deficits  Lab Results:  Recent Labs    10/11/17 0455 10/12/17 0426  WBC 7.8 6.8  HGB 10.8* 11.4*  HCT 31.9* 33.8*  PLT 247 310   BMET Recent Labs    10/11/17 0455 10/12/17 0426  NA 136 138  K 3.9 3.8  CL 100 100  CO2 27 27  GLUCOSE 112* 118*  BUN 9 7*  CREATININE 0.96 1.02  CALCIUM 8.7* 8.9   PT/INR No results for input(s): LABPROT, INR in the last 72 hours. Comprehensive Metabolic Panel:    Component Value Date/Time   NA 138 10/12/2017 0426   NA 136 10/11/2017 0455   K 3.8 10/12/2017 0426   K 3.9 10/11/2017 0455   CL 100 10/12/2017 0426   CL 100 10/11/2017 0455   CO2 27 10/12/2017 0426   CO2 27 10/11/2017 0455   BUN 7 (L) 10/12/2017 0426   BUN 9 10/11/2017 0455   CREATININE 1.02 10/12/2017 0426   CREATININE 0.96 10/11/2017 0455   CREATININE 1.17 09/17/2016 0720   GLUCOSE 118 (H) 10/12/2017 0426   GLUCOSE 112 (H) 10/11/2017 0455   CALCIUM 8.9 10/12/2017 0426   CALCIUM 8.7 (L) 10/11/2017 0455   AST 25 09/18/2017 1130   AST 17 09/17/2016 0720   ALT 27 09/18/2017 1130   ALT 17 09/17/2016 0720   ALKPHOS 74 09/18/2017 1130   ALKPHOS 75 09/17/2016 0720   BILITOT 0.4 09/18/2017 1130   BILITOT 0.5 09/17/2016 0720   PROT 7.4 09/18/2017 1130   PROT 7.0 09/17/2016 0720   ALBUMIN 4.1 09/18/2017 1130   ALBUMIN 4.0 09/17/2016 0720    Studies/Results: No results found.    Albert Ruff C. 0/26/3785  Patient ID: Albert Morris, male   DOB: 10/31/1950, 67 y.o.   MRN: [id number]

## 2017-10-12 NOTE — Progress Notes (Addendum)
All discharge teaching reviewed with the patient. All printed discharge instructions given and explained to the patient. Patient verbalizes an understanding of all instructions given. Printed prescription given to Patient. Explained to Patient to take printed prescription with him to his pharmacy. Patient verbalizes an understanding.

## 2017-10-12 NOTE — Discharge Summary (Signed)
Physician Discharge Summary  Patient ID: Albert Morris MRN: [id number] DOB/AGE: 08/01/1950 67 y.o.  Admit date: 10/08/2017 Discharge date: 10/12/2017  Admission Diagnoses: Colon polyp  Discharge Diagnoses:  Active Problems:   Colon polyp   Discharged Condition: good  Hospital Course: Pt admitted after surgery.  Diet was advanced as tolerated.  By POD 4 pt was ambulating and tolerating a diet.  He was having bowel function and his pain was controlled with oral meds.  Consults: None  Significant Diagnostic Studies: labs: cbc, bmet  Treatments: IV hydration, analgesia: acetaminophen and surgery: lap colectomy  Discharge Exam: Blood pressure 140/80, pulse 69, temperature 98.6 F (37 C), temperature source Oral, resp. rate 16, height 6\' 3"  (1.905 m), weight 97.6 kg, SpO2 97 %. General appearance: alert and cooperative GI: normal findings: soft, non-tender Incision/Wound: clean, dry, intact  Disposition: Home   Allergies as of 10/12/2017      Reactions   Blood-group Specific Substance    No whole blood products      Medication List    TAKE these medications   aspirin EC 81 MG tablet Take 1 tablet (81 mg total) by mouth daily.   atorvastatin 80 MG tablet Commonly known as:  LIPITOR TAKE 1 TABLET BY MOUTH EVERY DAY What changed:    how much to take  how to take this  when to take this   carvedilol 3.125 MG tablet Commonly known as:  COREG Take 1 tablet (3.125 mg total) by mouth 2 (two) times daily with a meal.   lisinopril 10 MG tablet Commonly known as:  PRINIVIL,ZESTRIL Take 1 tablet (10 mg total) by mouth daily.   lisinopril 20 MG tablet Commonly known as:  PRINIVIL,ZESTRIL Take 20mg  by mouth daily   traMADol 50 MG tablet Commonly known as:  ULTRAM Take 1 tablet (50 mg total) by mouth every 6 (six) hours as needed for up to 7 days (postop pain not controlled with tylenol).      Follow-up Information    [name], MD. Call in 2  week(s).   Specialty:  General Surgery Contact information: [place] [phone]           Signed: [name] 9/97/7414, 2:39 AM

## 2017-10-12 NOTE — Care Management Important Message (Signed)
Important Message  Patient Details  Name: Albert Morris MRN: [id number] Date of Birth: 02-Nov-1950   Medicare Important Message Given:  Yes    Kerin Salen 10/12/2017, 10:32 AMImportant Message  Patient Details  Name: Albert Morris MRN: [id number] Date of Birth: 17-Apr-1950   Medicare Important Message Given:  Yes    Kerin Salen 10/12/2017, 10:31 AM

## 2017-10-28 DIAGNOSIS — E559 Vitamin D deficiency, unspecified: Secondary | ICD-10-CM | POA: Diagnosis not present

## 2017-10-28 DIAGNOSIS — R7302 Impaired glucose tolerance (oral): Secondary | ICD-10-CM | POA: Diagnosis not present

## 2017-10-28 DIAGNOSIS — E785 Hyperlipidemia, unspecified: Secondary | ICD-10-CM | POA: Diagnosis not present

## 2017-10-28 DIAGNOSIS — I10 Essential (primary) hypertension: Secondary | ICD-10-CM | POA: Diagnosis not present

## 2017-11-06 ENCOUNTER — Telehealth (INDEPENDENT_AMBULATORY_CARE_PROVIDER_SITE_OTHER): Payer: Self-pay | Admitting: *Deleted

## 2017-11-06 NOTE — Telephone Encounter (Signed)
[name], MD  [name], MD Cc: Worthy Keeler        Thank you.  Will schedule colonoscopy in next year.   Najeeb.   Previous Messages    ----- Message -----  From: [name], MD  Sent: 11/03/2017  8:58 AM EDT  To: [name], MD  Subject: Doing well - Surveillance colonoscopy       Hi Rosalio Macadamia with colorectal at [facility] all is going well. Mr. Oyama underwent resection of the endoscopically unresectable colon polyp in his descending 10/08/17. He has done well. Pathology returned as a large benign adenomatous polyp (4cm). 16 lymph nodes, all also benign. Would you be willing to do his surveillance colonoscopy at 16yr? I appreciate your help and referral!   Gerald Stabs

## 2017-12-23 DIAGNOSIS — E785 Hyperlipidemia, unspecified: Secondary | ICD-10-CM | POA: Diagnosis not present

## 2017-12-23 DIAGNOSIS — Z125 Encounter for screening for malignant neoplasm of prostate: Secondary | ICD-10-CM | POA: Diagnosis not present

## 2017-12-23 DIAGNOSIS — I10 Essential (primary) hypertension: Secondary | ICD-10-CM | POA: Diagnosis not present

## 2017-12-23 DIAGNOSIS — Z1159 Encounter for screening for other viral diseases: Secondary | ICD-10-CM | POA: Diagnosis not present

## 2017-12-23 DIAGNOSIS — R7301 Impaired fasting glucose: Secondary | ICD-10-CM | POA: Diagnosis not present

## 2018-02-04 DIAGNOSIS — I10 Essential (primary) hypertension: Secondary | ICD-10-CM | POA: Diagnosis not present

## 2018-02-04 DIAGNOSIS — E785 Hyperlipidemia, unspecified: Secondary | ICD-10-CM | POA: Diagnosis not present

## 2018-02-04 DIAGNOSIS — R7302 Impaired glucose tolerance (oral): Secondary | ICD-10-CM | POA: Diagnosis not present

## 2018-02-19 ENCOUNTER — Telehealth (HOSPITAL_COMMUNITY): Payer: Self-pay | Admitting: *Deleted

## 2018-02-19 NOTE — Telephone Encounter (Signed)
02/19/18 left msg asking for patient to call to confirm appts for 02/22/18

## 2018-02-22 ENCOUNTER — Ambulatory Visit (HOSPITAL_COMMUNITY)
Admission: RE | Admit: 2018-02-22 | Discharge: 2018-02-22 | Disposition: A | Discharge status: Home / Self Care | Payer: Medicare HMO | Source: Ambulatory Visit | Attending: Family | Admitting: Family

## 2018-02-22 ENCOUNTER — Encounter: Payer: Self-pay | Admitting: Family

## 2018-02-22 ENCOUNTER — Other Ambulatory Visit: Payer: Self-pay

## 2018-02-22 ENCOUNTER — Ambulatory Visit: Payer: Medicare HMO | Admitting: Family

## 2018-02-22 VITALS — BP 150/85 | HR 68 | Temp 97.8°F | Resp 16 | Ht 75.0 in | Wt 220.0 lb

## 2018-02-22 DIAGNOSIS — I6522 Occlusion and stenosis of left carotid artery: Secondary | ICD-10-CM

## 2018-02-22 DIAGNOSIS — Z9889 Other specified postprocedural states: Secondary | ICD-10-CM

## 2018-02-22 NOTE — Patient Instructions (Signed)

## 2018-02-22 NOTE — Progress Notes (Signed)
Chief Complaint: Follow up Extracranial Carotid Artery Stenosis   History of Present Illness  Albert Morris is a 68 y.o. male who was admitted in May 2018 with a right cerebellar and posterior circulation infarct. He was found to have a 70-80% left carotid stenosis and duplex suggested a greater than 80% stenosis. Left carotid endarterectomy was recommended in order to lower his risk of future stroke. On 08/01/2016, he underwent a left carotid endarterectomy with bovine pericardial patch angioplasty by Dr. Scot Dock.  He denies claudication type sx's in his legs with walking. He denies any subsequent stroke or TIA.   He walks a mile most days of the week.   He is had an open hemicolectomy in August, 2019 by Dr. Dema Severin for a tumor in his colon, pt states bx was benign.   Diabetic: yes, last A1C result on file was 6.5 on 09-18-17 Tobacco use: former smoker, quit in 2006, started at age 65 years  Pt meds include: Statin : yes ASA: yes Other anticoagulants/antiplatelets: Plavix    Past Medical History:  Diagnosis Date  . CVA (cerebral vascular accident) ([place]) 06/21/2016  . Hyperlipidemia   . Hypertension   . Myocardial infarction ([place])   . Pneumonia   . Pre-diabetes   . Substance abuse [facility])     Social History Social History   Tobacco Use  . Smoking status: Former Smoker    Packs/day: 0.50    Years: 29.00    Pack years: 14.50    Last attempt to quit: 2006    Years since quitting: 14.0  . Smokeless tobacco: Never Used  Substance Use Topics  . Alcohol use: No    Comment: quit 2006  . Drug use: No    Comment: remote h/o heavy marijuana use, also used cocaine and others    Family History Family History  Problem Relation Age of Onset  . Breast cancer Mother        She is 68 years old, in [place]  . CVA Mother   . CAD Mother   . Arthritis Mother   . Cancer Mother   . Hypertension Mother   . Other Father        gunshot wound  . Early death Father        GSW   . Cancer Brother   . Cancer Brother   . Stroke Maternal Aunt   . Alcohol abuse Maternal Uncle   . Stroke Maternal Grandfather   . Colon cancer Neg Hx     Surgical History Past Surgical History:  Procedure Laterality Date  . CAROTID ENDARTERECTOMY Left   . COLON SURGERY  [phone]   growth removed, Laparoscopic Vs. open Left hemicolectomy Dr. Winter 09-25-17  . COLONOSCOPY N/A 06/26/2017   Procedure: COLONOSCOPY;  Surgeon: [name], MD;  [facility];  Service: Endoscopy;  Laterality: N/A;  730  . COLONOSCOPY WITH PROPOFOL N/A 10/07/2017   Procedure: COLONOSCOPY WITH PROPOFOL WITH TATTOO;  Surgeon: [name], MD;  Location: [facility];  Service: General;  Laterality: N/A;  . ENDARTERECTOMY Left 08/01/2016   Procedure: ENDARTERECTOMY LEFT CAROTID;  Surgeon: [name], MD;  Location: [facility];  Service: Vascular;  Laterality: Left;  . FLEXIBLE SIGMOIDOSCOPY N/A 10/08/2017   Procedure: FLEXIBLE SIGMOIDOSCOPY;  Surgeon: [name], MD;  [facility]: [facility];  Service: General;  Laterality: N/A;  . LAPAROSCOPIC RIGHT HEMI COLECTOMY Left 10/08/2017   Procedure: LAPAROSCOPIC VERSES OPEN LEFT HEMI COLECTOMY ERAS PATHWAY;  Surgeon:  [name], MD;  [facility];  Service: General;  Laterality: Left;  . PATCH ANGIOPLASTY Left 08/01/2016   Procedure: PATCH ANGIOPLASTY LEFT CAROTID ARTERY USING ZENOSURE BIOLOGIC PATCH;  Surgeon: [name], MD;  Location: [place];  Service: Vascular;  Laterality: Left;  . SUBMUCOSAL INJECTION  10/07/2017   Procedure: SUBMUCOSAL INJECTION;  Surgeon: [name], MD;  Location: [facility];  Service: General;;  . TONSILLECTOMY      Allergies  Allergen Reactions  . Blood-Group Specific Substance     No whole blood products    Current Outpatient Medications  Medication Sig Dispense Refill  . aspirin EC 81 MG tablet Take 1 tablet (81 mg total) by mouth daily. 90 tablet 3  . atorvastatin  (LIPITOR) 80 MG tablet TAKE 1 TABLET BY MOUTH EVERY DAY (Patient taking differently: Take 40mg  by mouth daily) 90 tablet 0  . carvedilol (COREG) 3.125 MG tablet Take 1 tablet (3.125 mg total) by mouth 2 (two) times daily with a meal. 180 tablet 0  . lisinopril (PRINIVIL,ZESTRIL) 20 MG tablet Take 20mg  by mouth daily  3  . lisinopril (PRINIVIL,ZESTRIL) 10 MG tablet Take 1 tablet (10 mg total) by mouth daily. (Patient not taking: Reported on 02/22/2018) 30 tablet 0   No current facility-administered medications for this visit.     Review of Systems : See HPI for pertinent positives and negatives.  Physical Examination  Vitals:   02/22/18 1444 02/22/18 1447  BP: (!) 154/83 (!) 150/85  Pulse: 66 68  Resp: 16   Temp: 97.8 F (36.6 C)   TempSrc: Oral   SpO2: 96%   Weight: 220 lb (99.8 kg)   Height: 6\' 3"  (1.905 m)    Body mass index is 27.5 kg/m.  General: WDWN male in NAD GAIT: normal Eyes: PERRLA HENT: No gross abnormalities.  Pulmonary:  Respirations are non-labored, good air movement in all fields, CTAB, no rales, no rhonchi, or wheezes. Cardiac: regular rhythm, no detected murmur.  VASCULAR EXAM Carotid Bruits Right Left   Negative Negative     Abdominal aortic pulse is not palpable. Radial pulses are 2+ palpable and equal.                                                                                                                            LE Pulses Right Left       POPLITEAL  not palpable   not palpable       POSTERIOR TIBIAL  1+ palpable   2+ palpable        DORSALIS PEDIS      ANTERIOR TIBIAL 1+ palpable  1+ palpable     Gastrointestinal: soft, nontender, BS WNL, no r/g, no palpable masses. Musculoskeletal: no muscle atrophy/wasting. M/S 5/5 throughout, extremities without ischemic changes. Skin: No rashes, no ulcers, no cellulitis.   Neurologic:  A&O X 3; appropriate affect, sensation is normal; speech is normal, CN 2-12 intact, pain and light  touch  intact in extremities, motor exam as listed above. Psychiatric: Normal thought content, mood appropriate to clinical situation.    Assessment: Albert Morris is a 68 y.o. male who is s/p left carotid endarterectomy with bovine pericardial patch angioplasty on 08-01-16. He was admitted in May 2018 with a right cerebellar and posterior circulation infarct prior to the left CEA.  He has not had any subsequent stroke or TIA.   His atherosclerotic risk factors include former smoker, DM in good control (he is not on any glycemic lowering agent), and CAD.   He takes a daily statin, Plavix, and ASA.    DATA Carotid Duplex (02-22-18): Right ICA: 1-39% stenosis Left ICA: CEA site with 1-39% stenosis Right vertebral artery flow is high resistant, left is antegrade. Bilateral subclavian artery waveforms are normal.  Slight increased stenosis in the left ICA compared to the exam on 09-04-17.    Follow-up in 1 year with Carotid Duplex scan.   I discussed in depth with the patient the nature of atherosclerosis, and emphasized the importance of maximal medical management including strict control of blood pressure, blood glucose, and lipid levels, obtaining regular exercise, and continued cessation of smoking.  The patient is aware that without maximal medical management the underlying atherosclerotic disease process will progress, limiting the benefit of any interventions. The patient was given information about stroke prevention and what symptoms should prompt the patient to seek immediate medical care. Thank you for allowing [place] to participate in this patient's care.  Clemon Chambers, RN, MSN, FNP-C [facility] Office: [facility] Physician: Trula Slade  02/22/18 3:01 PM

## 2018-03-22 ENCOUNTER — Ambulatory Visit: Payer: Medicare HMO | Admitting: Cardiovascular Disease

## 2018-03-22 ENCOUNTER — Encounter: Payer: Self-pay | Admitting: Cardiovascular Disease

## 2018-03-22 VITALS — BP 146/76 | HR 72 | Ht 75.0 in | Wt 219.0 lb

## 2018-03-22 DIAGNOSIS — Z8673 Personal history of transient ischemic attack (TIA), and cerebral infarction without residual deficits: Secondary | ICD-10-CM | POA: Diagnosis not present

## 2018-03-22 DIAGNOSIS — E785 Hyperlipidemia, unspecified: Secondary | ICD-10-CM

## 2018-03-22 DIAGNOSIS — I739 Peripheral vascular disease, unspecified: Secondary | ICD-10-CM | POA: Diagnosis not present

## 2018-03-22 DIAGNOSIS — I10 Essential (primary) hypertension: Secondary | ICD-10-CM | POA: Diagnosis not present

## 2018-03-22 DIAGNOSIS — I25118 Atherosclerotic heart disease of native coronary artery with other forms of angina pectoris: Secondary | ICD-10-CM

## 2018-03-22 NOTE — Patient Instructions (Signed)
Medication Instructions:  Your physician recommends that you continue on your current medications as directed. Please refer to the Current Medication list given to you today.  If you need a refill on your cardiac medications before your next appointment, please call your pharmacy.   Lab work: None today If you have labs (blood work) drawn today and your tests are completely normal, you will receive your results only by: [name] MyChart Message (if you have MyChart) OR . A paper copy in the mail If you have any lab test that is abnormal or we need to change your treatment, we will call you to review the results.  Testing/Procedures: None today  Follow-Up: At [facility], you and your health needs are our priority.  As part of our continuing mission to provide you with exceptional heart care, we have created designated Provider Care Teams.  These Care Teams include your primary Cardiologist (physician) and Advanced Practice Providers (APPs -  Physician Assistants and Nurse Practitioners) who all work together to provide you with the care you need, when you need it. You will need a follow up appointment in 12 months.  Please call our office 2 months in advance to schedule this appointment.  You may see Kate Sable, MD or one of the following Advanced Practice Providers on your designated Care Team:   Bernerd Pho, PA-C [facility]) . Ermalinda Barrios, PA-C ([place])  Any Other Special Instructions Will Be Listed Below (If Applicable). None

## 2018-03-22 NOTE — Progress Notes (Signed)
SUBJECTIVE: The patient presents for routine follow-up.  He has a history of CVA in 06/2016, left CEA in 07/2016, cardiomyopathy (LVEF 45-50% with basal inferoseptal akinesis, mild aortic regurgitation, grade 1 diastolic dysfunction), with abnormal nuclear stress test on 07/03/16 demonstrating inferior infarct and no large ischemic zones.  The patient denies any symptoms of chest pain, palpitations, shortness of breath, lightheadedness, dizziness, leg swelling, orthopnea, PND, and syncope.  He continues to walk at least 1 mile daily and has done so since 2006.   Soc Hx: Retired Curator. He quit smoking and drinking alcohol in 2006.  Review of Systems: As per "subjective", otherwise negative.  Allergies  Allergen Reactions  . Blood-Group Specific Substance     No whole blood products    Current Outpatient Medications  Medication Sig Dispense Refill  . aspirin EC 81 MG tablet Take 1 tablet (81 mg total) by mouth daily. 90 tablet 3  . atorvastatin (LIPITOR) 80 MG tablet TAKE 1 TABLET BY MOUTH EVERY DAY (Patient taking differently: Take 40mg  by mouth daily) 90 tablet 0  . carvedilol (COREG) 3.125 MG tablet Take 1 tablet (3.125 mg total) by mouth 2 (two) times daily with a meal. 180 tablet 0  . lisinopril (PRINIVIL,ZESTRIL) 10 MG tablet Take 1 tablet (10 mg total) by mouth daily. 30 tablet 0  . lisinopril (PRINIVIL,ZESTRIL) 20 MG tablet Take 20mg  by mouth daily  3   No current facility-administered medications for this visit.     Past Medical History:  Diagnosis Date  . CVA (cerebral vascular accident) ([place]) 06/21/2016  . Hyperlipidemia   . Hypertension   . Myocardial infarction ([place])   . Pneumonia   . Pre-diabetes   . Substance abuse [facility])     Past Surgical History:  Procedure Laterality Date  . CAROTID ENDARTERECTOMY Left   . COLON SURGERY  [phone]   growth removed, Laparoscopic Vs. open Left hemicolectomy Dr. Winter 09-25-17  . COLONOSCOPY N/A 06/26/2017   Procedure:  COLONOSCOPY;  Surgeon: [name], MD;  [facility];  Service: Endoscopy;  Laterality: N/A;  730  . COLONOSCOPY WITH PROPOFOL N/A 10/07/2017   Procedure: COLONOSCOPY WITH PROPOFOL WITH TATTOO;  Surgeon: [name], MD;  Location: [facility];  Service: General;  Laterality: N/A;  . ENDARTERECTOMY Left 08/01/2016   Procedure: ENDARTERECTOMY LEFT CAROTID;  Surgeon: [name], MD;  Location: [place];  Service: Vascular;  Laterality: Left;  . FLEXIBLE SIGMOIDOSCOPY N/A 10/08/2017   Procedure: FLEXIBLE SIGMOIDOSCOPY;  Surgeon: [name], MD;  [facility];  Service: General;  Laterality: N/A;  . LAPAROSCOPIC RIGHT HEMI COLECTOMY Left 10/08/2017   Procedure: LAPAROSCOPIC VERSES OPEN LEFT HEMI COLECTOMY ERAS PATHWAY;  Surgeon: [name], MD;  [facility];  Service: General;  Laterality: Left;  . PATCH ANGIOPLASTY Left 08/01/2016   Procedure: PATCH ANGIOPLASTY LEFT CAROTID ARTERY USING ZENOSURE BIOLOGIC PATCH;  Surgeon: [name], MD;  Location: [place];  Service: Vascular;  Laterality: Left;  . SUBMUCOSAL INJECTION  10/07/2017   Procedure: SUBMUCOSAL INJECTION;  Surgeon: [name], MD;  Location[facility];  Service: General;;  . TONSILLECTOMY      Social History   Socioeconomic History  . Marital status: Divorced    Spouse name: Not on file  . Number of children: 2  . Years of education: 93  . Highest education level: Not on file  Occupational History  . Occupation: disabled    Comment: alcoholic  Social Needs  . Financial resource strain: Not on file  . Food insecurity:    Worry: Not on file    Inability: Not on file  . Transportation needs:    Medical: Not on file    Non-medical: Not on file  Tobacco Use  . Smoking status: Former Smoker    Packs/day: 0.50    Years: 29.00    Pack years: 14.50    Last attempt to quit: 2006    Years since quitting: 14.0  . Smokeless tobacco: Never Used    Substance and Sexual Activity  . Alcohol use: No    Comment: quit 2006  . Drug use: No    Comment: remote h/o heavy marijuana use, also used cocaine and others  . Sexual activity: Not Currently  Lifestyle  . Physical activity:    Days per week: Not on file    Minutes per session: Not on file  . Stress: Not on file  Relationships  . Social connections:    Talks on phone: Not on file    Gets together: Not on file    Attends religious service: Not on file    Active member of club or organization: Not on file    Attends meetings of clubs or organizations: Not on file    Relationship status: Not on file  . Intimate partner violence:    Fear of current or ex partner: Not on file    Emotionally abused: Not on file    Physically abused: Not on file    Forced sexual activity: Not on file  Other Topics Concern  . Not on file  Social History Narrative   Divorced   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army for 8 years, EMT certified      Lives alone   Likes to walk      2 sons -in [place] and [place]:   03/22/18 1522  BP: (!) 146/76  Pulse: 72  SpO2: 95%  Weight: 219 lb (99.3 kg)  Height: 6\' 3"  (1.905 m)    Wt Readings from Last 3 Encounters:  03/22/18 219 lb (99.3 kg)  02/22/18 220 lb (99.8 kg)  10/12/17 215 lb 2.7 oz (97.6 kg)     PHYSICAL EXAM General: NAD HEENT: Normal. Neck: No JVD, no thyromegaly. Lungs: Clear to auscultation bilaterally with normal respiratory effort. CV: Regular rate and rhythm, normal S1/S2, no P6/P9, soft systolic murmur over right upper sternal border. No pretibial or periankle edema.  No carotid bruit.   Abdomen: Soft, nontender, no distention.  Neurologic: Alert and oriented.  Psych: Normal affect. Skin: Normal. Musculoskeletal: No gross deformities.    ECG: Reviewed above under Subjective   Labs: Lab Results  Component Value Date/Time   K 3.8 10/12/2017 04:26 AM   BUN 7 (L) 10/12/2017 04:26 AM   CREATININE 1.02 10/12/2017 04:26 AM    CREATININE 1.17 09/17/2016 07:20 AM   ALT 27 09/18/2017 11:30 AM   TSH 3.086 06/24/2016 10:49 PM   HGB 11.4 (L) 10/12/2017 04:26 AM     Lipids: Lab Results  Component Value Date/Time   LDLCALC 92 09/17/2016 07:20 AM   CHOL 154 09/17/2016 07:20 AM   TRIG 64 09/17/2016 07:20 AM   HDL 49 09/17/2016 07:20 AM       ASSESSMENT AND PLAN: 1.  Coronary artery disease with ischemic cardiomyopathy: Symptomatically stable. Continue ASA, carvedilol, Lipitor, and lisinopril.  I will obtain a copy of lipid panel from PCP to see if  LDL is at goal.  2.  Hypertension: Mildly elevated today.  No changes to therapy for now.  3.  Hyperlipidemia: I will obtain a copy of lipids from PCP to see if LDL is at goal (less than 70).  Continue Lipitor 80 mg.  4. History of CVA: Continue aspirin 81 mg and Lipitor 80 mg.  5.  Peripheral vascular disease: Continue aspirin 81 mg and Lipitor 80 mg.      Disposition: Follow up 1 year.   Kate Sable, M.D., F.A.C.C.

## 2018-05-06 DIAGNOSIS — R7302 Impaired glucose tolerance (oral): Secondary | ICD-10-CM | POA: Diagnosis not present

## 2018-05-06 DIAGNOSIS — R7303 Prediabetes: Secondary | ICD-10-CM | POA: Diagnosis not present

## 2018-05-06 DIAGNOSIS — E559 Vitamin D deficiency, unspecified: Secondary | ICD-10-CM | POA: Diagnosis not present

## 2018-05-06 DIAGNOSIS — E785 Hyperlipidemia, unspecified: Secondary | ICD-10-CM | POA: Diagnosis not present

## 2018-05-06 DIAGNOSIS — I10 Essential (primary) hypertension: Secondary | ICD-10-CM | POA: Diagnosis not present

## 2018-06-14 ENCOUNTER — Telehealth (INDEPENDENT_AMBULATORY_CARE_PROVIDER_SITE_OTHER): Payer: Self-pay | Admitting: *Deleted

## 2018-06-14 NOTE — Telephone Encounter (Signed)
Patient is on May recall for 1 year TCS -- looks like he one in September 2019 as well -- does he still need repeat TCS

## 2018-06-17 NOTE — Telephone Encounter (Signed)
Yes he would need colonoscopy in August or September this year. He had multiple adenomas removed at colonoscopy.  He had a very large polyp for which he underwent extended left hemicolectomy in August 2019.  No carcinoma was found in this polyp

## 2018-06-17 NOTE — Telephone Encounter (Signed)
Noted in recall 

## 2018-07-26 IMAGING — CT CT ANGIO HEAD
1 of 12 series · 5 of 33 positions shown · IV contrast (OMNI 350)
Comparison: 06/24/2016 MRI of the head and MRA of the head.

CLINICAL DATA: 65 y/o  M; stroke.

EXAM:
CT ANGIOGRAPHY HEAD AND NECK
TECHNIQUE: Multidetector CT imaging of the head and neck was performed using
the standard protocol during bolus administration of intravenous
contrast. Multiplanar CT image reconstructions and MIPs were
obtained to evaluate the vascular anatomy. Carotid stenosis
measurements (when applicable) are obtained utilizing NASCET
criteria, using the distal internal carotid diameter as the
denominator.
CONTRAST:  50 cc Isovue 370

[Series 7: cta neck axial · axial · 0.39mm/px · z∈[-310,-67]mm · 5 of 374 slices shown]
[im 63/374  soft-tissue]
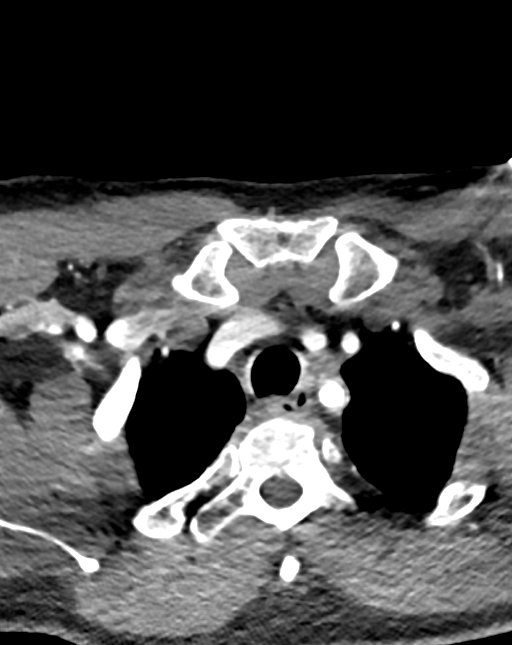
[im 125/374  bone]
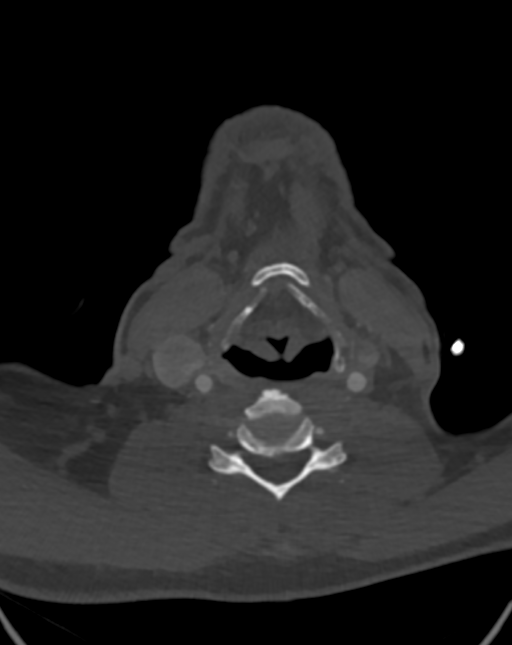
[im 187/374  soft-tissue]
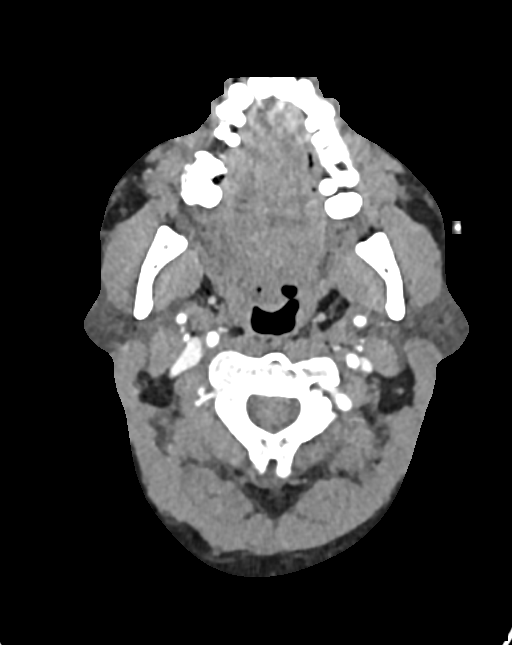
[im 249/374  bone]
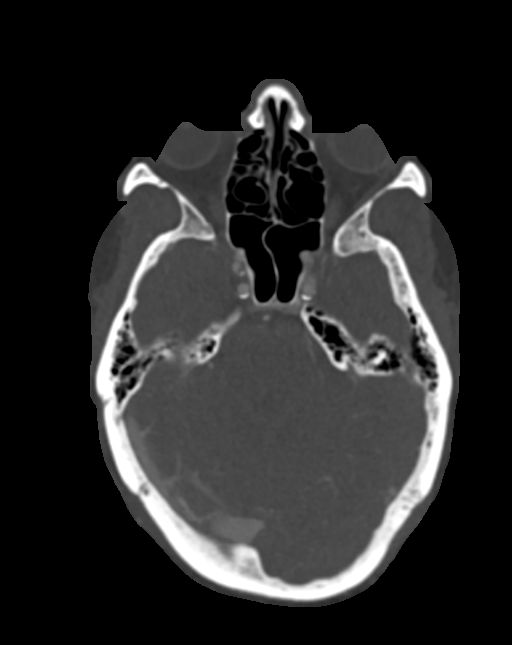
[im 311/374  soft-tissue]
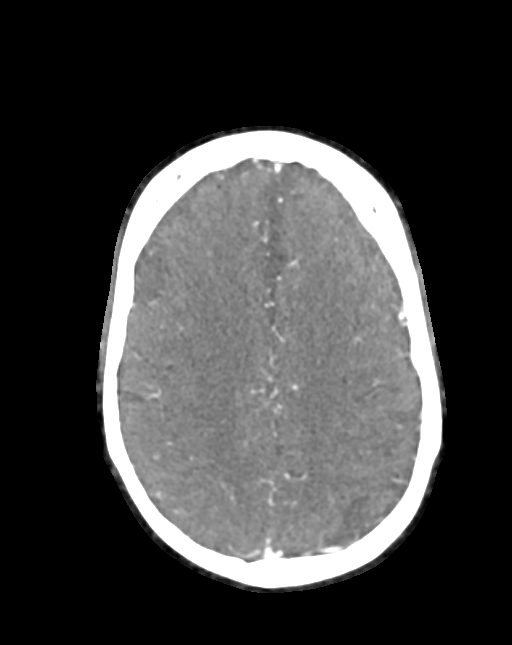

[5 of 33 positions shown; findings below may reference images not displayed]

FINDINGS: CT HEAD FINDINGS

Brain: Small foci of hypoattenuation within the right cerebellar
hemisphere correspond areas of infarction on the prior MRI of the
brain. There is no evidence for new large territory infarct, acute
intracranial hemorrhage, or focal mass effect. Scattered foci of
hypoattenuation in periventricular and subcortical white matter are
nonspecific but compatible with mild chronic microvascular ischemic
changes. Mild diffuse brain parenchymal volume loss.

Vascular: As below.

Skull: Normal. Negative for fracture or focal lesion.

Sinuses: Imaged portions are clear.

Orbits: No acute finding.

Review of the MIP images confirms the above findings

CTA NECK FINDINGS

Aortic arch: Bovine arch, normal variant. Mild calcific
atherosclerosis of the arch.

Right carotid system: No evidence of dissection, stenosis (50% or
greater) or occlusion. Scattered calcified plaque of the common
carotid artery and moderate mixed plaque of the carotid bifurcation
without significant stenosis.

Left carotid system: Moderate to severe mixed plaque of the left
carotid bifurcation with severe proximal ICA 70-80% stenosis (series
9, image 161).

Vertebral arteries: Left dominant. No evidence of dissection,
stenosis (50% or greater) or occlusion.

Skeleton: Mild to moderate cervical spondylosis. No high-grade bony
canal stenosis. Multiple periapical cysts predominantly of the
residual maxillary teeth compatible with odontogenic disease.

Other neck: Negative.

Upper chest: Negative.

Review of the MIP images confirms the above findings

CTA HEAD FINDINGS

Anterior circulation: Extensive calcified plaque of the ICA
cavernous and paraclinoid segments with moderate to severe right and
moderate left paraclinoid stenosis (series 9 image 96).

Long segment of moderate left M1 stenosis. Bilateral A1, A2, and
right M1 segment irregularity with areas of mild stenosis.

Otherwise no large vessel occlusion, aneurysm, or significant
stenosis is identified.

Posterior circulation: Severe stenosis/ near occlusion of the right
V3 4 junction (series 7, image 167) with lumen irregularity and
multiple areas of stenosis of the residual right V4 segment. No
definite right PICA identified, hypoplastic or occluded. Mild
calcified plaque of the left V4 segment with areas of mild stenosis.

Severe mid basilar stenosis.

Diminutive right P1 segment with large right posterior communicating
artery consistent with persistent fetal circulation. Bilateral P2
segments are irregular with multiple areas of mild-to-moderate left
and up to severe right stenosis.

Otherwise no large vessel occlusion, aneurysm, or significant
stenosis is identified.

Venous sinuses: As permitted by contrast timing, patent.

Anatomic variants: Bilateral posterior communicating arteries and
the anterior communicating artery are identified.

Delayed phase: No abnormal intracranial enhancement.

Review of the MIP images confirms the above findings
IMPRESSION: CT head:

1. Foci of hypoattenuation in the right cerebellar hemisphere
corresponds to acute infarcts on prior MRI.
2. No evidence for new large territory infarct, acute intracranial
hemorrhage, or significant mass effect.
3. Mild chronic microvascular ischemic changes and mild parenchymal
volume loss of the brain.
CTA neck:

1. Severe 70-80% left proximal ICA stenosis secondary to mixed
plaque.
2. Otherwise no large vessel occlusion, aneurysm, or significant
stenosis of the carotid and vertebral arteries of the neck is
identified.
CTA head:

1. Severe stenosis/near occlusion of the right V3/V4 junction with
severe atherosclerotic disease of the right V4 segment. No definite
right PICA identified, either hypoplastic or occluded.
2. Severe mid basilar stenosis.
3. Calcified plaque of bilateral internal carotid arteries with
moderate to severe right and moderate left paraclinoid stenosis.
4. Extensive atherosclerosis of the circle of Willis with stenosis
greatest at the left M1 and right P2 segments.
5. No large vessel occlusion or aneurysm of the circle of Willis
identified.

By: Arnoldo Kang M.D.

## 2018-08-10 ENCOUNTER — Ambulatory Visit (INDEPENDENT_AMBULATORY_CARE_PROVIDER_SITE_OTHER): Payer: Medicare HMO | Admitting: Internal Medicine

## 2018-08-10 DIAGNOSIS — E559 Vitamin D deficiency, unspecified: Secondary | ICD-10-CM | POA: Diagnosis not present

## 2018-08-10 DIAGNOSIS — I10 Essential (primary) hypertension: Secondary | ICD-10-CM | POA: Diagnosis not present

## 2018-08-10 DIAGNOSIS — E785 Hyperlipidemia, unspecified: Secondary | ICD-10-CM | POA: Diagnosis not present

## 2018-08-10 DIAGNOSIS — E119 Type 2 diabetes mellitus without complications: Secondary | ICD-10-CM | POA: Diagnosis not present

## 2018-10-12 ENCOUNTER — Encounter (INDEPENDENT_AMBULATORY_CARE_PROVIDER_SITE_OTHER): Payer: Self-pay | Admitting: *Deleted

## 2018-10-26 ENCOUNTER — Other Ambulatory Visit (INDEPENDENT_AMBULATORY_CARE_PROVIDER_SITE_OTHER): Payer: Self-pay | Admitting: Internal Medicine

## 2018-10-26 MED ORDER — CARVEDILOL 3.125 MG PO TABS: 3.1250 mg | ORAL_TABLET | Freq: Two times a day (BID) | ORAL | 0 refills | Status: DC

## 2018-11-03 ENCOUNTER — Encounter (INDEPENDENT_AMBULATORY_CARE_PROVIDER_SITE_OTHER): Payer: Self-pay | Admitting: *Deleted

## 2018-11-03 ENCOUNTER — Telehealth (INDEPENDENT_AMBULATORY_CARE_PROVIDER_SITE_OTHER): Payer: Self-pay | Admitting: *Deleted

## 2018-11-03 DIAGNOSIS — R195 Other fecal abnormalities: Secondary | ICD-10-CM

## 2018-11-03 MED ORDER — PEG 3350-KCL-NA BICARB-NACL 420 G PO SOLR: 4000.0000 mL | Freq: Once | ORAL | 0 refills | Status: AC

## 2018-11-03 NOTE — Telephone Encounter (Signed)
Patient is scheduled for colonoscopy 12/01/18 -- patient to needs to stop ASA 2 days prior and Plavix 5 days prior - please advise if ok to stop, thanks

## 2018-11-03 NOTE — Telephone Encounter (Signed)
Referring MD/PCP: gosrani   Procedure: tcs  Reason/Indication:  Hx polyps  Has patient had this procedure before?  Yes, 2019  If so, when, by whom and where?    Is there a family history of colon cancer?  no  Who?  What age when diagnosed?    Is patient diabetic?   no      Does patient have prosthetic heart valve or mechanical valve?  no  Do you have a pacemaker/defibrillator?  no  Has patient ever had endocarditis/atrial fibrillation? no  Does patient use oxygen? no  Has patient had joint replacement within last 12 months?  no  Is patient constipated or do they take laxatives? n  Does patient have a history of alcohol/drug use?  no  Is patient on blood thinner such as Coumadin, Plavix and/or Aspirin? yes  Medications: asa 81 mg daily, plavix 75 mg daily, lisinopril 20 mg daily, atorvastatin 80 mg daily  Allergies: nkda  Medication Adjustment per Dr Charlena Cross, NP: asa 2 days & plavis 5 days  Procedure date & time: 12/01/18 at 930

## 2018-11-03 NOTE — Telephone Encounter (Signed)
Patient needs trilyte TCS sch'd 10/14

## 2018-11-03 NOTE — Telephone Encounter (Signed)
Yes, this should be okay to stop aspirin and Plavix.

## 2018-11-03 NOTE — Telephone Encounter (Signed)
Forward to Dr Anastasio Champion.

## 2018-11-03 NOTE — Telephone Encounter (Signed)
Patient aware.

## 2018-11-04 ENCOUNTER — Other Ambulatory Visit (INDEPENDENT_AMBULATORY_CARE_PROVIDER_SITE_OTHER): Payer: Self-pay | Admitting: *Deleted

## 2018-11-04 ENCOUNTER — Other Ambulatory Visit: Payer: Self-pay

## 2018-11-04 ENCOUNTER — Ambulatory Visit (INDEPENDENT_AMBULATORY_CARE_PROVIDER_SITE_OTHER): Payer: Self-pay

## 2018-11-11 ENCOUNTER — Encounter (INDEPENDENT_AMBULATORY_CARE_PROVIDER_SITE_OTHER): Payer: Self-pay | Admitting: Internal Medicine

## 2018-11-11 ENCOUNTER — Other Ambulatory Visit: Payer: Self-pay

## 2018-11-11 ENCOUNTER — Ambulatory Visit (INDEPENDENT_AMBULATORY_CARE_PROVIDER_SITE_OTHER): Payer: Medicare HMO | Admitting: Internal Medicine

## 2018-11-11 VITALS — BP 150/80 | HR 72 | Ht 75.0 in | Wt 212.6 lb

## 2018-11-11 DIAGNOSIS — E785 Hyperlipidemia, unspecified: Secondary | ICD-10-CM | POA: Diagnosis not present

## 2018-11-11 DIAGNOSIS — E119 Type 2 diabetes mellitus without complications: Secondary | ICD-10-CM | POA: Diagnosis not present

## 2018-11-11 DIAGNOSIS — I10 Essential (primary) hypertension: Secondary | ICD-10-CM | POA: Diagnosis not present

## 2018-11-11 DIAGNOSIS — E559 Vitamin D deficiency, unspecified: Secondary | ICD-10-CM | POA: Diagnosis not present

## 2018-11-11 DIAGNOSIS — E1159 Type 2 diabetes mellitus with other circulatory complications: Secondary | ICD-10-CM | POA: Insufficient documentation

## 2018-11-11 DIAGNOSIS — E1169 Type 2 diabetes mellitus with other specified complication: Secondary | ICD-10-CM | POA: Insufficient documentation

## 2018-11-11 HISTORY — DX: Type 2 diabetes mellitus without complications: E11.9

## 2018-11-11 NOTE — Patient Instructions (Signed)
[facility] Dietary Recommendations for Weight Loss What to Avoid . Avoid added sugars o Often added sugar can be found in processed foods such as many condiments, dry cereals, cakes, cookies, chips, crisps, crackers, candies, sweetened drinks, etc.  o Read labels and AVOID/DECREASE use of foods with the following in their ingredient list: Sugar, fructose, high fructose corn syrup, sucrose, glucose, maltose, dextrose, molasses, cane sugar, brown sugar, any type of syrup, agave nectar, etc.   . Avoid snacking in between meals . Avoid foods made with flour o If you are going to eat food made with flour, choose those made with whole-grains; and, minimize your consumption as much as is tolerable . Avoid processed foods o These foods are generally stocked in the middle of the grocery store. Focus on shopping on the perimeter of the grocery.  What to Include . Vegetables o GREEN LEAFY VEGETABLES: Kale, spinach, mustard greens, collard greens, cabbage, broccoli, etc. o OTHER: Asparagus, cauliflower, eggplant, carrots, peas, Brussel sprouts, tomatoes, bell peppers, zucchini, beets, cucumbers, etc. . Grains, seeds, and legumes o Beans: kidney beans, black eyed peas, garbanzo beans, black beans, pinto beans, etc. o Whole, unrefined grains: brown rice, barley, bulgur, oatmeal, etc. . Healthy fats  o Avoid highly processed fats such as vegetable oil o Examples of healthy fats: avocado, olives, virgin olive oil, dark chocolate (?72% Cocoa), nuts (peanuts, almonds, walnuts, cashews, pecans, etc.) . Low - Moderate Intake of Animal Sources of Protein o Meat sources: chicken, turkey, salmon, tuna. Limit to 4 ounces of meat at one time. o Consider limiting dairy sources, but when choosing dairy focus on: PLAIN Greek yogurt, cottage cheese, high-protein milk . Fruit o Choose berries  When to Eat . Intermittent Fasting: o Choosing not to eat for a specific time period, but DO FOCUS ON HYDRATION  when fasting o Multiple Techniques: - Time Restricted Eating: eat 3 meals in a day, each meal lasting no more than 60 minutes, no snacks between meals - 16-18 hour fast: fast for 16 to 18 hours up to 7 days a week. Often suggested to start with 2-3 nonconsecutive days per week.  . Remember the time you sleep is counted as fasting.  . Examples of eating schedule: Fast from 7:00pm-11:00am. Eat between 11:00am-7:00pm.  - 24-hour fast: fast for 24 hours up to every other day. Often suggested to start with 1 day per week . Remember the time you sleep is counted as fasting . Examples of eating schedule:  o Eating day: eat 2-3 meals on your eating day. If doing 2 meals, each meal should last no more than 90 minutes. If doing 3 meals, each meal should last no more than 60 minutes. Finish last meal by 7:00pm. o Fasting day: Fast until 7:00pm.  o IF YOU FEEL UNWELL FOR ANY REASON/IN ANY WAY WHEN FASTING, STOP FASTING BY EATING A NUTRITIOUS SNACK OR LIGHT MEAL o ALWAYS FOCUS ON HYDRATION DURING FASTS - Acceptable Hydration sources: water, broths, tea/coffee (black tea/coffee is best but using a small amount of whole-fat dairy products in coffee/tea is acceptable).  - Poor Hydration Sources: anything with sugar or artificial sweeteners added to it  These recommendations have been developed for patients that are actively receiving medical care from either Dr. Sabrena Gavitt or Sarah Gray, DNP, NP-C at [facility]. These recommendations are developed for patients with specific medical conditions and are not meant to be distributed or used by others that are not actively receiving care from either provider listed   above at [facility]. It is not appropriate to participate in the above eating plans without proper medical supervision.   Reference: Fung, J. The obesity code. [place]: [place]; 2016.   

## 2018-11-11 NOTE — Progress Notes (Signed)
Wellness Office Visit  Subjective:  Patient ID: Albert Morris, male    DOB: 12/26/1950  Age: 68 y.o. MRN: [id number]  CC: This man comes in for follow-up of his diabetes, hypertension, hyperlipidemia and vitamin D deficiency. HPI  He is doing reasonably well and he has changed his eating habits somewhat, trying to eat earlier towards the end of the day and as a result has managed to lose weight. On the last visit he was diagnosed officially as diabetic with a hemoglobin A1c of 6.5%.  Prior to this, he had been prediabetic. He is compliant with antihypertensive medications. He has had a stroke in the past.  He also has had coronary artery disease. Thankfully, he does not describe chest pain, dyspnea, palpitations or new limb weakness today.  He continues on statin therapy in the face of these chronic conditions above.  He denies any myalgias. Past Medical History:  Diagnosis Date  . CVA (cerebral vascular accident) ([place]) 06/21/2016  . Diabetes mellitus without complication ([place]) 123XX123  . Hyperlipidemia   . Hypertension   . Myocardial infarction ([place])   . Pneumonia   . Pre-diabetes   . Substance abuse ([place])       Family History  Problem Relation Age of Onset  . Breast cancer Mother        She is 2 years old, in [place]  . CVA Mother   . CAD Mother   . Arthritis Mother   . Cancer Mother   . Hypertension Mother   . Other Father        gunshot wound  . Early death Father        GSW  . Cancer Brother   . Cancer Brother   . Stroke Maternal Aunt   . Alcohol abuse Maternal Uncle   . Stroke Maternal Grandfather   . 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 aspirin EC 81 MG tablet Take 1 tablet (81 mg total) by mouth daily.  [name] atorvastatin (LIPITOR) 80 MG tablet TAKE  1 TABLET BY MOUTH EVERY DAY (Patient taking differently: Take 40mg  by mouth daily)  . carvedilol (COREG) 3.125 MG tablet Take 1 tablet (3.125 mg total) by mouth 2 (two) times daily with a meal.  . Cholecalciferol (VITAMIN D-3) 125 MCG (5000 UT) TABS Take 2 tablets by mouth daily.  . clopidogrel (PLAVIX) 75 MG tablet Take 75 mg by mouth daily.  [name] lisinopril (PRINIVIL,ZESTRIL) 20 MG tablet Take 20mg  by mouth daily     Nutrition  Eating somewhat healthier now, earlier dinner.  He is not able to do intermittent fasting he says. Sleep  Usually gets 4 to 6 hours of uninterrupted sleep every night.  Exercise  None regular. Bio Identical Hormones  None.  Objective:   Today's Vitals: BP (!) 150/80   Pulse 72   Ht 6\' 3"  (1.905 m)   Wt 212 lb 9.6 oz (96.4 kg)   BMI 26.57 kg/m  Vitals with BMI 11/11/2018 03/22/2018 02/22/2018  Height 6\' 3"  6\' 3"  -  Weight 212 lbs 10 oz 219 lbs -  BMI [id number] -  Systolic [id number] Q000111Q  Diastolic 80 76 85  Pulse 72 72 68     Physical Exam   He looks systemically well.  Systolic blood pressure elevated today but he tells me he was up late last night.  He is alert and orientated without any focal neurological signs.    Assessment   1. Essential hypertension   2. Dyslipidemia   3. Diabetes mellitus without complication ([place])   4. Vitamin D deficiency disease       Tests ordered Orders Placed This Encounter  Procedures  . COMPLETE METABOLIC PANEL WITH GFR  . Lipid panel  . VITAMIN D 25 Hydroxy (Vit-D Deficiency, Fractures)  . Hemoglobin A1c     Plan: 1. He will continue with all medications for his chronic conditions above. 2. He does have an upcoming colonoscopy and he knows that he needs to stop taking the baby aspirin and Plavix about 5 days prior to the procedure. 3. I recommended he come back in October to get influenza vaccination and pneumonia vaccination. 4. Further recommendations will depend on blood results and he will  follow-up with Sarah for his annual Medicare wellness visit.     [name], MD

## 2018-11-12 ENCOUNTER — Other Ambulatory Visit (INDEPENDENT_AMBULATORY_CARE_PROVIDER_SITE_OTHER): Payer: Self-pay | Admitting: *Deleted

## 2018-11-12 ENCOUNTER — Encounter (INDEPENDENT_AMBULATORY_CARE_PROVIDER_SITE_OTHER): Payer: Self-pay | Admitting: Internal Medicine

## 2018-11-12 DIAGNOSIS — Z860100 Personal history of colon polyps, unspecified: Secondary | ICD-10-CM | POA: Insufficient documentation

## 2018-11-12 LAB — COMPLETE METABOLIC PANEL WITH GFR
AG Ratio: 1.5 (calc) (ref 1.0–2.5)
ALT: 24 U/L (ref 9–46)
AST: 27 U/L (ref 10–35)
Albumin: 4.2 g/dL (ref 3.6–5.1)
Alkaline phosphatase (APISO): 75 U/L (ref 35–144)
BUN/Creatinine Ratio: 17 (calc) (ref 6–22)
BUN: 21 mg/dL (ref 7–25)
CO2: 28 mmol/L (ref 20–32)
Calcium: 9.6 mg/dL (ref 8.6–10.3)
Chloride: 101 mmol/L (ref 98–110)
Creat: 1.27 mg/dL — ABNORMAL HIGH (ref 0.70–1.25)
GFR, Est African American: 66 mL/min/{1.73_m2} (ref >=60)
GFR, Est Non African American: 57 mL/min/{1.73_m2} — ABNORMAL LOW (ref >=60)
Globulin: 2.8 g/dL (calc) (ref 1.9–3.7)
Glucose, Bld: 111 mg/dL — ABNORMAL HIGH (ref 65–99)
Potassium: 4.7 mmol/L (ref 3.5–5.3)
Sodium: 137 mmol/L (ref 135–146)
Total Bilirubin: 0.5 mg/dL (ref 0.2–1.2)
Total Protein: 7 g/dL (ref 6.1–8.1)

## 2018-11-12 LAB — HEMOGLOBIN A1C
Hgb A1c MFr Bld: 6.3 % of total Hgb — ABNORMAL HIGH (ref <5.7)
Mean Plasma Glucose: 134 (calc)
eAG (mmol/L): 7.4 (calc)

## 2018-11-12 LAB — LIPID PANEL
Cholesterol: 114 mg/dL (ref <200)
HDL: 38 mg/dL — ABNORMAL LOW (ref >=40)
LDL Cholesterol (Calc): 62 mg/dL (calc)
Non-HDL Cholesterol (Calc): 76 mg/dL (calc) (ref <130)
Total CHOL/HDL Ratio: 3 (calc) (ref <5.0)
Triglycerides: 62 mg/dL (ref <150)

## 2018-11-12 LAB — VITAMIN D 25 HYDROXY (VIT D DEFICIENCY, FRACTURES): Vit D, 25-Hydroxy: 54 ng/mL (ref 30–100)

## 2018-11-14 NOTE — Telephone Encounter (Signed)
Colonoscopy with conscious sedation 

## 2018-11-29 ENCOUNTER — Other Ambulatory Visit: Payer: Self-pay

## 2018-11-29 ENCOUNTER — Other Ambulatory Visit (HOSPITAL_COMMUNITY)
Admission: RE | Admit: 2018-11-29 | Discharge: 2018-11-29 | Disposition: A | Discharge status: Home / Self Care | Payer: Medicare HMO | Source: Ambulatory Visit | Attending: Internal Medicine | Admitting: Internal Medicine

## 2018-11-29 DIAGNOSIS — Z20828 Contact with and (suspected) exposure to other viral communicable diseases: Secondary | ICD-10-CM | POA: Diagnosis not present

## 2018-11-29 DIAGNOSIS — K621 Rectal polyp: Secondary | ICD-10-CM | POA: Diagnosis not present

## 2018-11-29 DIAGNOSIS — Z01812 Encounter for preprocedural laboratory examination: Secondary | ICD-10-CM | POA: Insufficient documentation

## 2018-11-29 LAB — SARS CORONAVIRUS 2 (TAT 6-24 HRS): SARS Coronavirus 2: NEGATIVE

## 2018-12-01 ENCOUNTER — Encounter (HOSPITAL_COMMUNITY): Payer: Self-pay | Admitting: *Deleted

## 2018-12-01 ENCOUNTER — Encounter (HOSPITAL_COMMUNITY)
Admission: RE | Disposition: A | Discharge status: Home / Self Care | Payer: Self-pay | Source: Ambulatory Visit | Attending: Internal Medicine

## 2018-12-01 ENCOUNTER — Other Ambulatory Visit: Payer: Self-pay

## 2018-12-01 ENCOUNTER — Ambulatory Visit (HOSPITAL_COMMUNITY)
Admission: RE | Admit: 2018-12-01 | Discharge: 2018-12-01 | Disposition: A | Discharge status: Home / Self Care | Payer: Medicare HMO | Source: Ambulatory Visit | Attending: Internal Medicine | Admitting: Internal Medicine

## 2018-12-01 DIAGNOSIS — Z87891 Personal history of nicotine dependence: Secondary | ICD-10-CM | POA: Insufficient documentation

## 2018-12-01 DIAGNOSIS — K623 Rectal prolapse: Secondary | ICD-10-CM | POA: Diagnosis not present

## 2018-12-01 DIAGNOSIS — Z8673 Personal history of transient ischemic attack (TIA), and cerebral infarction without residual deficits: Secondary | ICD-10-CM | POA: Insufficient documentation

## 2018-12-01 DIAGNOSIS — E785 Hyperlipidemia, unspecified: Secondary | ICD-10-CM | POA: Diagnosis not present

## 2018-12-01 DIAGNOSIS — K621 Rectal polyp: Secondary | ICD-10-CM | POA: Insufficient documentation

## 2018-12-01 DIAGNOSIS — I252 Old myocardial infarction: Secondary | ICD-10-CM | POA: Insufficient documentation

## 2018-12-01 DIAGNOSIS — Z79899 Other long term (current) drug therapy: Secondary | ICD-10-CM | POA: Diagnosis not present

## 2018-12-01 DIAGNOSIS — Z7982 Long term (current) use of aspirin: Secondary | ICD-10-CM | POA: Insufficient documentation

## 2018-12-01 DIAGNOSIS — I10 Essential (primary) hypertension: Secondary | ICD-10-CM | POA: Diagnosis not present

## 2018-12-01 DIAGNOSIS — Z980 Intestinal bypass and anastomosis status: Secondary | ICD-10-CM | POA: Insufficient documentation

## 2018-12-01 DIAGNOSIS — Z860100 Personal history of colon polyps, unspecified: Secondary | ICD-10-CM | POA: Insufficient documentation

## 2018-12-01 DIAGNOSIS — Z7902 Long term (current) use of antithrombotics/antiplatelets: Secondary | ICD-10-CM | POA: Insufficient documentation

## 2018-12-01 DIAGNOSIS — E119 Type 2 diabetes mellitus without complications: Secondary | ICD-10-CM | POA: Diagnosis not present

## 2018-12-01 DIAGNOSIS — Z09 Encounter for follow-up examination after completed treatment for conditions other than malignant neoplasm: Secondary | ICD-10-CM | POA: Diagnosis not present

## 2018-12-01 HISTORY — PX: POLYPECTOMY: SHX5525

## 2018-12-01 HISTORY — PX: COLONOSCOPY: SHX5424

## 2018-12-01 SURGERY — COLONOSCOPY
Anesthesia: Moderate Sedation

## 2018-12-01 MED ORDER — SODIUM CHLORIDE 0.9 % IV SOLN
INTRAVENOUS | Status: DC
  Administered 2018-12-01: 1000 mL via INTRAVENOUS

## 2018-12-01 MED ORDER — STERILE WATER FOR IRRIGATION IR SOLN
Status: DC | PRN
  Administered 2018-12-01: 10:00:00

## 2018-12-01 MED ORDER — MEPERIDINE HCL 50 MG/ML IJ SOLN
INTRAMUSCULAR | Status: DC | PRN
  Administered 2018-12-01 (×2): 25 mg

## 2018-12-01 MED ORDER — MIDAZOLAM HCL 5 MG/5ML IJ SOLN
INTRAMUSCULAR | Status: DC | PRN
  Administered 2018-12-01 (×3): 2 mg via INTRAVENOUS

## 2018-12-01 MED ORDER — MIDAZOLAM HCL 5 MG/5ML IJ SOLN
INTRAMUSCULAR | Status: AC
  Filled 2018-12-01: qty 10

## 2018-12-01 MED ORDER — MEPERIDINE HCL 50 MG/ML IJ SOLN
INTRAMUSCULAR | Status: AC
  Filled 2018-12-01: qty 1

## 2018-12-01 NOTE — H&P (Signed)
Albert Morris is an 68 y.o. male.   Chief Complaint: Patient is here for colonoscopy. HPI: Patient is 68 year old Afro-American male who has history of multiple colonic adenomas.  He had large tubular but survived ascending colon for which she had laparoscopic surgery in August last year.  There was no carcinoma this large polyp which measured 40 mm. Patient denies abdominal pain change in bowel habits or rectal bleeding. He reports having abdominal surgery when he was a child.  He said he had a growth removed from his small intestine or abdomen but no further details available. Last Plavix dose was 5 days ago and aspirin dose was 2 days ago. Family history is negative for CRC.  Past Medical History:  Diagnosis Date  . CVA (cerebral vascular accident) ([place]) 06/21/2016  . Diabetes mellitus without complication ([place]) 123XX123  . Hyperlipidemia   . Hypertension   . Myocardial infarction ([place])   . Pneumonia   . Pre-diabetes   . Substance abuse [facility])     Past Surgical History:  Procedure Laterality Date  . CAROTID ENDARTERECTOMY Left   . COLON SURGERY  [phone]   growth removed, Laparoscopic Vs. open Left hemicolectomy Dr. Winter 09-25-17  . COLONOSCOPY N/A 06/26/2017   Procedure: COLONOSCOPY;  Surgeon: [name], MD;  [facility];  Service: Endoscopy;  Laterality: N/A;  730  . COLONOSCOPY WITH PROPOFOL N/A 10/07/2017   Procedure: COLONOSCOPY WITH PROPOFOL WITH TATTOO;  Surgeon: [name], MD;  Location: [facility];  Service: General;  Laterality: N/A;  . ENDARTERECTOMY Left 08/01/2016   Procedure: ENDARTERECTOMY LEFT CAROTID;  Surgeon: [name], MD;  Location: [place];  Service: Vascular;  Laterality: Left;  . FLEXIBLE SIGMOIDOSCOPY N/A 10/08/2017   Procedure: FLEXIBLE SIGMOIDOSCOPY;  Surgeon: [name], MD;  Location: [facility];  Service: General;  Laterality: N/A;  . LAPAROSCOPIC RIGHT HEMI COLECTOMY Left 10/08/2017   Procedure:  LAPAROSCOPIC VERSES OPEN LEFT HEMI COLECTOMY ERAS PATHWAY;  Surgeon: [name], MD;  Location: [facility];  Service: General;  Laterality: Left;  . PATCH ANGIOPLASTY Left 08/01/2016   Procedure: PATCH ANGIOPLASTY LEFT CAROTID ARTERY USING ZENOSURE BIOLOGIC PATCH;  Surgeon: [name], MD;  Location: [place];  Service: Vascular;  Laterality: Left;  . SUBMUCOSAL INJECTION  10/07/2017   Procedure: SUBMUCOSAL INJECTION;  Surgeon: [name], MD;  Location: [facility];  Service: General;;  . TONSILLECTOMY      Family History  Problem Relation Age of Onset  . Breast cancer Mother        She is 73 years old, in [place]  . CVA Mother   . CAD Mother   . Arthritis Mother   . Cancer Mother   . Hypertension Mother   . Other Father        gunshot wound  . Early death Father        GSW  . Cancer Brother   . Cancer Brother   . Stroke Maternal Aunt   . Alcohol abuse Maternal Uncle   . Stroke Maternal Grandfather   . Colon cancer Neg Hx    Social History:  reports that he quit smoking about 14 years ago. He has a 14.50 pack-year smoking history. He has never used smokeless tobacco. He reports that he does not drink alcohol or use drugs.  Allergies:  Allergies  Allergen Reactions  . Blood-Group Specific Substance     No whole blood products    Medications Prior to Admission  Medication Sig Dispense Refill  . aspirin EC 81 MG tablet Take 1 tablet (81 mg total) by mouth daily. 90 tablet 3  . atorvastatin (LIPITOR) 80 MG tablet TAKE 1 TABLET BY MOUTH EVERY DAY (Patient taking differently: Take 80 mg by mouth daily at 6 PM. ) 90 tablet 0  . carvedilol (COREG) 3.125 MG tablet Take 1 tablet (3.125 mg total) by mouth 2 (two) times daily with a meal. 180 tablet 0  . Cholecalciferol (VITAMIN D3) 250 MCG (10000 UT) capsule Take 10,000 Units by mouth daily.    . clopidogrel (PLAVIX) 75 MG tablet Take 75 mg by mouth daily.    [name] lisinopril (PRINIVIL,ZESTRIL) 20 MG tablet Take 20  mg by mouth daily.   3    No results found for this or any previous visit (from the past 48 hour(s)). No results found.  ROS  Blood pressure (!) 145/97, pulse 67, temperature 97.7 F (36.5 C), temperature source Oral, resp. rate 12, height 6\' 3"  (1.905 m), weight 96.2 kg, SpO2 100 %. Physical Exam  Constitutional: He appears well-developed and well-nourished.  HENT:  Mouth/Throat: Oropharynx is clear and moist.  Eyes: Conjunctivae are normal. No scleral icterus.  Neck: No thyromegaly present.  Cardiovascular: Normal rate, regular rhythm and normal heart sounds.  No murmur heard. Respiratory: Effort normal.  GI:  Abdomen is symmetrical with left upper paramedian scar extending to below the level of umbilicus.  Laparoscopy scars.  Abdomen soft and nontender.  Musculoskeletal:        General: No edema.  Lymphadenopathy:    He has no cervical adenopathy.  Neurological: He is alert.  Skin: Skin is warm and dry.     Assessment/Plan History of multiple colonic adenomas. Surveillance colonoscopy.  Hildred Laser, MD 12/01/2018, 9:42 AM

## 2018-12-01 NOTE — Op Note (Signed)
[facility] Patient Name: Albert Morris Procedure Date: 12/01/2018 9:27 AM MRN: [id number] Date of Birth: June 12, 1950 Attending MD: Hildred Laser , MD CSN: [id number] Age: 68 Admit Type: Outpatient Procedure:                Colonoscopy Indications:              High risk colon cancer surveillance: Personal                            history of colonic polyps Providers:                Hildred Laser, MD, Janeece Riggers, RN, Aram Candela Referring MD:             [name], MD and Dr. [name], MD Medicines:                Meperidine 50 mg IV, Midazolam 6 mg IV Complications:            No immediate complications. Estimated Blood Loss:     Estimated blood loss was minimal. Procedure:                Pre-Anesthesia Assessment:                           - Prior to the procedure, a History and Physical                            was performed, and patient medications and                            allergies were reviewed. The patient's tolerance of                            previous anesthesia was also reviewed. The risks                            and benefits of the procedure and the sedation                            options and risks were discussed with the patient.                            All questions were answered, and informed consent                            was obtained. Prior Anticoagulants: The patient                            last took Plavix (clopidogrel) 5 days prior to the                            procedure and has taken no previous anticoagulant                            or antiplatelet agents except for aspirin. ASA  Grade Assessment: III - A patient with severe                            systemic disease. After reviewing the risks and                            benefits, the patient was deemed in satisfactory                            condition to undergo the procedure.                           After obtaining  informed consent, the colonoscope                            was passed under direct vision. Throughout the                            procedure, the patient's blood pressure, pulse, and                            oxygen saturations were monitored continuously. The                            [id number]-[id number]) scope was introduced through                            the anus and advanced to the the cecum, identified                            by appendiceal orifice and ileocecal valve. The                            colonoscopy was performed without difficulty. The                            patient tolerated the procedure well. The quality                            of the bowel preparation was good. The ileocecal                            valve, appendiceal orifice, and rectum were                            photographed. Scope In: 9:55:32 AM Scope Out: 10:11:18 AM Scope Withdrawal Time: 0 hours 10 minutes 48 seconds  Total Procedure Duration: 0 hours 15 minutes 46 seconds  Findings:      The perianal and digital rectal examinations were normal.      There was evidence of a prior end-to-end colo-colonic anastomosis in the       distal sigmoid colon. This was patent and was characterized by healthy       appearing mucosa and visible sutures.      The  exam was otherwise normal throughout the examined colon.      A small polyp was found in the rectum. The polyp was sessile. The polyp       was removed with a cold snare. Resection and retrieval were complete.      The retroflexed view of the distal rectum and anal verge was normal and       showed no anal or rectal abnormalities. Impression:               - Patent end-to-end colo-colonic anastomosis,                            characterized by healthy appearing mucosa and                            visible sutures.                           - One small polyp in the rectum, removed with a                            cold snare. Resected  and retrieved.                           - The distal rectum and anal verge are normal on                            retroflexion view. Moderate Sedation:      Moderate (conscious) sedation was administered by the endoscopy nurse       and supervised by the endoscopist. The following parameters were       monitored: oxygen saturation, heart rate, blood pressure, CO2       capnography and response to care. Total physician intraservice time was       23 minutes. Recommendation:           - Patient has a contact number available for                            emergencies. The signs and symptoms of potential                            delayed complications were discussed with the                            patient. Return to normal activities tomorrow.                            Written discharge instructions were provided to the                            patient.                           - Resume previous diet today.                           - Continue present  medications.                           - Resume Plavix (clopidogrel) tomorrow and previous                            antiplatelet medication today at prior doses.                           - Await pathology results.                           - Repeat colonoscopy in 5 years for surveillance. Procedure Code(s):        --- Professional ---                           [phone], Colonoscopy, flexible; with removal of                            tumor(s), polyp(s), or other lesion(s) by snare                            technique                           99153, Moderate sedation; each additional 15                            minutes intraservice time                           G0500, Moderate sedation services provided by the                            same physician or other qualified health care                            professional performing a gastrointestinal                            endoscopic service that sedation supports,                             requiring the presence of an independent trained                            observer to assist in the monitoring of the                            patient's level of consciousness and physiological                            status; initial 15 minutes of intra-service time;                            patient age 91 years or older (additional  time may                            be reported with [phone], as appropriate) Diagnosis Code(s):        --- Professional ---                           Z86.010, Personal history of colonic polyps                           Z98.0, Intestinal bypass and anastomosis status                           K62.1, Rectal polyp CPT copyright 2019 American Medical Association. All rights reserved. The codes documented in this report are preliminary and upon coder review may  be revised to meet current compliance requirements. Hildred Laser, MD Hildred Laser, MD 12/01/2018 10:25:17 AM This report has been signed electronically. Number of Addenda: 0

## 2018-12-01 NOTE — Discharge Instructions (Signed)
Resume aspirin this evening and clopidogrel tomorrow. Resume other medications as before. Resume usual diet. No driving for 24 hours. Physician will call with biopsy results. Next colonoscopy in 5 years.   Colonoscopy, Adult, Care After This sheet gives you information about how to care for yourself after your procedure. Your doctor may also give you more specific instructions. If you have problems or questions, call your doctor. What can I expect after the procedure? After the procedure, it is common to have:  A small amount of blood in your poop for 24 hours.  Some gas.  Mild cramping or bloating in your belly. Follow these instructions at home: General instructions  For the first 24 hours after the procedure: ? Do not drive or use machinery. ? Do not sign important documents. ? Do not drink alcohol. ? Do your daily activities more slowly than normal. ? Eat foods that are soft and easy to digest.  Take over-the-counter or prescription medicines only as told by your doctor. To help cramping and bloating:   Try walking around.  Put heat on your belly (abdomen) as told by your doctor. Use a heat source that your doctor recommends, such as a moist heat pack or a heating pad. ? Put a towel between your skin and the heat source. ? Leave the heat on for 20-30 minutes. ? Remove the heat if your skin turns bright red. This is especially important if you cannot feel pain, heat, or cold. You can get burned. Eating and drinking   Drink enough fluid to keep your pee (urine) clear or pale yellow.  Return to your normal diet as told by your doctor. Avoid heavy or fried foods that are hard to digest.  Avoid drinking alcohol for as long as told by your doctor. Contact a doctor if:  You have blood in your poop (stool) 2-3 days after the procedure. Get help right away if:  You have more than a small amount of blood in your poop.  You see large clumps of tissue (blood clots) in  your poop.  Your belly is swollen.  You feel sick to your stomach (nauseous).  You throw up (vomit).  You have a fever.  You have belly pain that gets worse, and medicine does not help your pain. Summary  After the procedure, it is common to have a small amount of blood in your poop. You may also have mild cramping and bloating in your belly.  For the first 24 hours after the procedure, do not drive or use machinery, do not sign important documents, and do not drink alcohol.  Get help right away if you have a lot of blood in your poop, feel sick to your stomach, have a fever, or have more belly pain. This information is not intended to replace advice given to you by your health care provider. Make sure you discuss any questions you have with your health care provider. Document Released: 03/08/2010 Document Revised: 12/04/2016 Document Reviewed: 10/29/2015 Elsevier Patient Education  2020 [employer].  Colon Polyps  Polyps are tissue growths inside the body. Polyps can grow in many places, including the large intestine (colon). A polyp may be a round bump or a mushroom-shaped growth. You could have one polyp or several. Most colon polyps are noncancerous (benign). However, some colon polyps can become cancerous over time. Finding and removing the polyps early can help prevent this. What are the causes? The exact cause of colon polyps is not known. What increases  the risk? You are more likely to develop this condition if you:  Have a family history of colon cancer or colon polyps.  Are older than 61 or older than 45 if you are African American.  Have inflammatory bowel disease, such as ulcerative colitis or Crohn's disease.  Have certain hereditary conditions, such as: ? Familial adenomatous polyposis. ? Lynch syndrome. ? Turcot syndrome. ? Peutz-Jeghers syndrome.  Are overweight.  Smoke cigarettes.  Do not get enough exercise.  Drink too much alcohol.  Eat a diet  that is high in fat and red meat and low in fiber.  Had childhood cancer that was treated with abdominal radiation. What are the signs or symptoms? Most polyps do not cause symptoms. If you have symptoms, they may include:  Blood coming from your rectum when having a bowel movement.  Blood in your stool. The stool may look dark red or black.  Abdominal pain.  A change in bowel habits, such as constipation or diarrhea. How is this diagnosed? This condition is diagnosed with a colonoscopy. This is a procedure in which a lighted, flexible scope is inserted into the anus and then passed into the colon to examine the area. Polyps are sometimes found when a colonoscopy is done as part of routine cancer screening tests. How is this treated? Treatment for this condition involves removing any polyps that are found. Most polyps can be removed during a colonoscopy. Those polyps will then be tested for cancer. Additional treatment may be needed depending on the results of testing. Follow these instructions at home: Lifestyle  Maintain a healthy weight, or lose weight if recommended by your health care provider.  Exercise every day or as told by your health care provider.  Do not use any products that contain nicotine or tobacco, such as cigarettes and e-cigarettes. If you need help quitting, ask your health care provider.  If you drink alcohol, limit how much you have: ? 0-1 drink a day for women. ? 0-2 drinks a day for men.  Be aware of how much alcohol is in your drink. In the [place] one drink equals one 12 oz bottle of beer (355 mL), one 5 oz glass of wine (148 mL), or one 1 oz shot of hard liquor (44 mL). Eating and drinking   Eat foods that are high in fiber, such as fruits, vegetables, and whole grains.  Eat foods that are high in calcium and vitamin D, such as milk, cheese, yogurt, eggs, liver, fish, and broccoli.  Limit foods that are high in fat, such as fried foods and  desserts.  Limit the amount of red meat and processed meat you eat, such as hot dogs, sausage, bacon, and lunch meats. General instructions  Keep all follow-up visits as told by your health care provider. This is important. ? This includes having regularly scheduled colonoscopies. ? Talk to your health care provider about when you need a colonoscopy. Contact a health care provider if:  You have new or worsening bleeding during a bowel movement.  You have new or increased blood in your stool.  You have a change in bowel habits.  You lose weight for no known reason. Summary  Polyps are tissue growths inside the body. Polyps can grow in many places, including the colon.  Most colon polyps are noncancerous (benign), but some can become cancerous over time.  This condition is diagnosed with a colonoscopy.  Treatment for this condition involves removing any polyps that are found. Most polyps  can be removed during a colonoscopy. This information is not intended to replace advice given to you by your health care provider. Make sure you discuss any questions you have with your health care provider. Document Released: 10/31/2003 Document Revised: 05/21/2017 Document Reviewed: 05/21/2017 Elsevier Patient Education  2020 [employer].

## 2018-12-02 LAB — SURGICAL PATHOLOGY

## 2018-12-08 ENCOUNTER — Encounter (HOSPITAL_COMMUNITY): Payer: Self-pay | Admitting: Internal Medicine

## 2018-12-14 ENCOUNTER — Other Ambulatory Visit (INDEPENDENT_AMBULATORY_CARE_PROVIDER_SITE_OTHER): Payer: Self-pay | Admitting: Internal Medicine

## 2018-12-28 ENCOUNTER — Ambulatory Visit (INDEPENDENT_AMBULATORY_CARE_PROVIDER_SITE_OTHER): Payer: Medicare HMO | Admitting: Nurse Practitioner

## 2019-01-11 ENCOUNTER — Other Ambulatory Visit: Payer: Self-pay

## 2019-01-11 DIAGNOSIS — Z20822 Contact with and (suspected) exposure to covid-19: Secondary | ICD-10-CM

## 2019-01-12 LAB — NOVEL CORONAVIRUS, NAA: SARS-CoV-2, NAA: DETECTED — AB

## 2019-01-16 ENCOUNTER — Other Ambulatory Visit (INDEPENDENT_AMBULATORY_CARE_PROVIDER_SITE_OTHER): Payer: Self-pay | Admitting: Internal Medicine

## 2019-01-23 ENCOUNTER — Other Ambulatory Visit (INDEPENDENT_AMBULATORY_CARE_PROVIDER_SITE_OTHER): Payer: Self-pay | Admitting: Internal Medicine

## 2019-01-26 ENCOUNTER — Encounter (INDEPENDENT_AMBULATORY_CARE_PROVIDER_SITE_OTHER): Payer: Self-pay | Admitting: Nurse Practitioner

## 2019-01-26 ENCOUNTER — Telehealth (INDEPENDENT_AMBULATORY_CARE_PROVIDER_SITE_OTHER): Payer: Medicare HMO | Admitting: Nurse Practitioner

## 2019-01-26 VITALS — BP 140/90

## 2019-01-26 DIAGNOSIS — Z0000 Encounter for general adult medical examination without abnormal findings: Secondary | ICD-10-CM | POA: Diagnosis not present

## 2019-01-26 DIAGNOSIS — Z87891 Personal history of nicotine dependence: Secondary | ICD-10-CM

## 2019-01-26 NOTE — Patient Instructions (Signed)
Today we completed an Annual Wellness Visit. During this visit, we discussed the following:    1. Colon Cancer Screening: Completed October 2020, Due: October 2025: You were instructed to notify your gastroenterologist if you feel you need screening sooner, especially if you experience symptoms such as unintentional weight loss, unexplained fatigue, changes in bowel habits, or blood noted in stool 2. Sexually Transmitted Infection Screening: Due now, will make note to add this to next blood work 3. Hepatitis C Screening: Completed 4. Lung Cancer Screening: Does not qualify 5. Abdominal Aortic Aneurysm Screening: Due; I will order this  Please get your flu shot at [facility] as we discussed previously. Consider getting pneumonia vaccine I highly recommend that you get these vaccinations.   Advanced Care Planning: We discussed advanced care planning. Please notify call our office you wish to make any changes regarding what we discussed today.  Follow-up as scheduled in 1 year. You will be due for your next Medicare Annual Wellness Visit in 1 year.  Please call the office if you have any questions or concerns prior to your next appointment.

## 2019-01-26 NOTE — Progress Notes (Signed)
Due to national recommendations of social distancing related to the [place] pandemic, an audio/visual tele-health visit was felt to be the most appropriate encounter type for this patient today. I connected with  [name] on 01/26/19 utilizing audio-only technology and verified that I am speaking with the correct person using two identifiers. The patient was located at their home, and I was located at the office of [facility] during the encounter.  Patient did not have access to technology allowing him to utilize video in this visit.  Thus, visit was conducted using telephone.  I discussed the limitations of evaluation and management by telemedicine. The patient expressed understanding and agreed to proceed.     Subjective:   Albert Morris is a 68 y.o. male who presents for Medicare Annual/Subsequent preventive examination.  Review of Systems:  All systems negative       Objective:    Vitals: BP 140/90 Comment: Checked at [employer] " a few days ago"  There is no height or weight on file to calculate BMI.  Advanced Directives 12/01/2018 10/08/2017 10/07/2017 09/18/2017 06/26/2017 08/01/2016 07/30/2016  Does Patient Have a Medical Advance Directive? No No - No No No No  Would patient like information on creating a medical advance directive? No - Patient declined No - Patient declined No - Patient declined Yes (MAU/Ambulatory/Procedural Areas - Information given) Yes (MAU/Ambulatory/Procedural Areas - Information given) No - Patient declined No - Patient declined   Patient does have advanced directive.  He has MOST form.  He does not want to make any changes to MOST form today.  Tobacco Social History   Tobacco Use  Smoking Status Former Smoker  . Packs/day: 0.50  . Years: 29.00  . Pack years: 14.50  . Types: Cigarettes  . Quit date: 2006  . Years since quitting: 14.9  Smokeless Tobacco Never Used     Counseling given: Not Answered   Clinical Intake:                        Past Medical History:  Diagnosis Date  . Colon polyps   . CVA (cerebral vascular accident) ([place]) 06/21/2016  . Diabetes mellitus without complication ([place]) 123XX123  . Hyperlipidemia   . Hypertension   . Myocardial infarction ([place])   . Pneumonia   . Pre-diabetes   . Substance abuse [facility])    Past Surgical History:  Procedure Laterality Date  . CAROTID ENDARTERECTOMY Left   . COLON SURGERY  [phone]   growth removed, Laparoscopic Vs. open Left hemicolectomy Dr. Winter 09-25-17  . COLONOSCOPY N/A 06/26/2017   Procedure: COLONOSCOPY;  Surgeon: [name], MD;  [facility];  Service: Endoscopy;  Laterality: N/A;  730  . COLONOSCOPY N/A 12/01/2018   Procedure: COLONOSCOPY;  Surgeon: [name], MD;  [facility];  Service: Endoscopy;  Laterality: N/A;  930  . COLONOSCOPY WITH PROPOFOL N/A 10/07/2017   Procedure: COLONOSCOPY WITH PROPOFOL WITH TATTOO;  Surgeon: [name], MD;  Location: [facility];  Service: General;  Laterality: N/A;  . ENDARTERECTOMY Left 08/01/2016   Procedure: ENDARTERECTOMY LEFT CAROTID;  Surgeon: [name], MD;  Location: [place];  Service: Vascular;  Laterality: Left;  . FLEXIBLE SIGMOIDOSCOPY N/A 10/08/2017   Procedure: FLEXIBLE SIGMOIDOSCOPY;  Surgeon: [name], MD;  Location: [facility];  Service: General;  Laterality: N/A;  . LAPAROSCOPIC RIGHT HEMI COLECTOMY Left 10/08/2017   Procedure: LAPAROSCOPIC VERSES OPEN LEFT  [place];  Surgeon: [name], MD;  Location: [facility];  Service: General;  Laterality: Left;  . PATCH ANGIOPLASTY Left 08/01/2016   Procedure: PATCH ANGIOPLASTY LEFT CAROTID ARTERY USING ZENOSURE BIOLOGIC PATCH;  Surgeon: [name], MD;  Location: [place];  Service: Vascular;  Laterality: Left;  . POLYPECTOMY  12/01/2018   Procedure: POLYPECTOMY;  Surgeon: [name], MD;  [facility];  Service:  Endoscopy;;  . SUBMUCOSAL INJECTION  10/07/2017   Procedure: SUBMUCOSAL INJECTION;  Surgeon: [name], MD;  Location: [facility];  Service: General;;  . TONSILLECTOMY     Family History  Problem Relation Age of Onset  . Breast cancer Mother        lived to be 1  . CVA Mother   . CAD Mother   . Arthritis Mother   . Cancer Mother   . Hypertension Mother   . Other Father        gunshot wound  . Early death Father        GSW  . Cancer Brother        Stomach Cancer  . Cancer Brother        Lung Cancer  . Stroke Maternal Aunt   . Alcohol abuse Maternal Uncle   . Stroke Maternal Grandfather   . Colon cancer Neg Hx    Social History   Socioeconomic History  . Marital status: Divorced    Spouse name: Not on file  . Number of children: 2  . Years of education: 78  . Highest education level: Not on file  Occupational History  . Occupation: disabled    Comment: alcoholic  Social Needs  . Financial resource strain: Not on file  . Food insecurity    Worry: Not on file    Inability: Not on file  . Transportation needs    Medical: Not on file    Non-medical: Not on file  Tobacco Use  . Smoking status: Former Smoker    Packs/day: 0.50    Years: 29.00    Pack years: 14.50    Types: Cigarettes    Quit date: 2006    Years since quitting: 14.9  . Smokeless tobacco: Never Used  Substance and Sexual Activity  . Alcohol use: Not Currently    Comment: quit 2006  . Drug use: Not Currently    Comment: remote h/o heavy marijuana use, also used cocaine and others  . Sexual activity: Not Currently  Lifestyle  . Physical activity    Days per week: Not on file    Minutes per session: Not on file  . Stress: Not on file  Relationships  . Social Herbalist on phone: Not on file    Gets together: Not on file    Attends religious service: Not on file    Active member of club or organization: Not on file    Attends meetings of clubs or organizations: Not on  file    Relationship status: Not on file  Other Topics Concern  . Not on file  Social History Narrative   Divorced   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army for 8 years, EMT certified      Lives alone   [place] to walk      2 sons -in [place] and [place]    Outpatient Encounter Medications as of 01/26/2019  Medication Sig  . aspirin EC 81 MG tablet Take 1 tablet (81 mg total) by mouth  daily.  . atorvastatin (LIPITOR) 80 MG tablet TAKE 1 TABLET BY MOUTH EVERY DAY (Patient taking differently: Take 80 mg by mouth daily at 6 PM. )  . carvedilol (COREG) 3.125 MG tablet TAKE 1 TABLET(3.125 MG) BY MOUTH TWICE DAILY WITH A MEAL  . Cholecalciferol (VITAMIN D3) 250 MCG (10000 UT) capsule Take 10,000 Units by mouth daily.  . clopidogrel (PLAVIX) 75 MG tablet TAKE 1 TABLET BY MOUTH DAILY  . lisinopril (ZESTRIL) 20 MG tablet TAKE 1 TABLET BY MOUTH DAILY  . [DISCONTINUED] carvedilol (COREG) 3.125 MG tablet Take 1 tablet (3.125 mg total) by mouth 2 (two) times daily with a meal.  . [DISCONTINUED] clopidogrel (PLAVIX) 75 MG tablet Take 75 mg by mouth daily.   No facility-administered encounter medications on file as of 01/26/2019.     Activities of Daily Living In your present state of health, do you have any difficulty performing the following activities: 01/26/2019  Hearing? N  Vision? N  Difficulty concentrating or making decisions? N  Walking or climbing stairs? N  Dressing or bathing? N  Doing errands, shopping? N  Some recent data might be hidden   He denies any falls in the last year.  We did discuss ways to prevent falls such as using railings on stairways, adding grab bars in his bathroom, removing any tripping hazards such as throw rugs, and using adequate lighting in the home.  We also discussed that if he were to fall and hit his head he should follow-up in the emergency room to rule out brain bleed.  Activities of daily living (KATZ score): 6/6 Instrumental activities of daily living ([place]-Brody  score): 8/8  Patient Care Team: [name], MD as PCP - General (Internal Medicine) Herminio Commons, MD as PCP - Cardiology (Cardiology)   Assessment:   This is a routine wellness examination for Ahmadou.  Exercise Activities and Dietary recommendations    Goals    . Blood Pressure < 140/90    . Exercise 150 minutes per week (walking)       Fall Risk Fall Risk  04/16/2017 10/14/2016 07/08/2016  Falls in the past year? No No No   He denies any falls in the last year.  We did discuss ways to prevent falls such as using railings on stairways, adding grab bars in his bathroom, removing any tripping hazards such as throw rugs, and using adequate lighting in the home.  We also discussed that if he were to fall and hit his head he should follow-up in the emergency room to rule out brain bleed.  Is the patient's home free of loose throw rugs in walkways, pet beds, electrical cords, etc?   no      Grab bars in the bathroom? no      Handrails on the stairs?   yes - outside stairs ( 3 steps) with railing      Adequate lighting?   yes  Timed Get Up and Go Performed: Not performed today as office visit was conducted remotely  Depression Screen PHQ 2/9 Scores 01/26/2019 04/16/2017 10/14/2016 07/08/2016  PHQ - 2 Score 0 0 0 0    Cognitive Function 6Cit: 0/28        There is no immunization history for the selected administration types on file for this patient.  Immunizations: 1.  Flu: He is due for the flu shot.  He would be interested in having this administered.  He tells me he wants to get [facility] at [facility]  next time he goes to the pharmacy. 2.  Pneumonia: He is not interested in pneumonia vaccine at this time.  Screening Tests Health Maintenance  Topic Date Due  . FOOT EXAM  09/15/1960  . OPHTHALMOLOGY EXAM  09[birth year]15/1959  . TETANUS/TDAP  09/14/1968  . PNA vac Low Risk Adult (1 of 2 - PCV13) 09/15/2014  . INFLUENZA VACCINE  09/18/2018  . HEMOGLOBIN A1C  05/11/2019   . COLONOSCOPY  12/01/2023  . Hepatitis C Screening  Completed   Patient would like to also undergo sexual transmitted infection screening.  We will make a note to add this to blood work next time he is seen in the office.  He would also qualify for abdominal aortic aneurysm screening based on his history of smoking.  I will order this screening test for him.  Cancer Screenings: Lung: Low Dose CT Chest recommended if Age 7-80 years, 30 pack-year currently smoking OR have quit w/in 15years. Patient does not qualify. Colorectal: He is up-to-date with colon cancer screening and will be due to rescreen in October 2025.  Additional Screenings:  Hepatitis C Screening: Completed      Plan:   He will go to [facility] to have flu shot administered.  I also encouraged him to consider pneumonia vaccination, but at this time he wants to hold off on this.  I will make a note to add a sexually transmitted infection screening to next blood work at his next office visit.  I will order abdominal aortic aneurysm screening via ultrasound.  No changes to most form made today per patient's preference.  He will follow-up in office as scheduled in about 1 month for a office visit, and will be due for annual wellness visit again around this time next year.  I have personally reviewed and noted the following in the patient's chart:   . Medical and social history . Use of alcohol, tobacco or illicit drugs  . Current medications and supplements . Functional ability and status . Nutritional status . Physical activity . Advanced directives . List of other physicians . Hospitalizations, surgeries, and ER visits in previous 12 months . Vitals . Screenings to include cognitive, depression, and falls . Referrals and appointments  In addition, I have reviewed and discussed with patient certain preventive protocols, quality metrics, and best practice recommendations. A written personalized care plan for preventive  services as well as general preventive health recommendations was not provided to the patient today as his office visit was completed virtually.  He does not currently have MyChart, however I will make a note in patient instructions, and will try to have this printed out for him at his next office visit and/or will be available if he signed up for my chart.     This telephone visit lasted for 30 minutes and 30 seconds.  Ailene Ards, NP  01/26/2019

## 2019-02-01 ENCOUNTER — Other Ambulatory Visit (INDEPENDENT_AMBULATORY_CARE_PROVIDER_SITE_OTHER): Payer: Self-pay | Admitting: Internal Medicine

## 2019-02-02 ENCOUNTER — Ambulatory Visit (HOSPITAL_COMMUNITY)
Admission: RE | Admit: 2019-02-02 | Discharge: 2019-02-02 | Disposition: A | Discharge status: Home / Self Care | Payer: Medicare HMO | Source: Ambulatory Visit | Attending: Nurse Practitioner | Admitting: Nurse Practitioner

## 2019-02-02 ENCOUNTER — Other Ambulatory Visit: Payer: Self-pay

## 2019-02-02 DIAGNOSIS — I700 Atherosclerosis of aorta: Secondary | ICD-10-CM | POA: Insufficient documentation

## 2019-02-02 DIAGNOSIS — Z0000 Encounter for general adult medical examination without abnormal findings: Secondary | ICD-10-CM

## 2019-02-02 DIAGNOSIS — Z136 Encounter for screening for cardiovascular disorders: Secondary | ICD-10-CM | POA: Diagnosis not present

## 2019-02-02 DIAGNOSIS — Z87891 Personal history of nicotine dependence: Secondary | ICD-10-CM | POA: Diagnosis not present

## 2019-02-07 ENCOUNTER — Telehealth (INDEPENDENT_AMBULATORY_CARE_PROVIDER_SITE_OTHER): Payer: Self-pay | Admitting: Nurse Practitioner

## 2019-02-07 NOTE — Telephone Encounter (Signed)
I attempted to call this patient today to discuss his results of his most recent AAA screening via ultrasound.  He did not answer the phone but I did leave a message asking him to give [place] a call back.

## 2019-02-08 ENCOUNTER — Telehealth (INDEPENDENT_AMBULATORY_CARE_PROVIDER_SITE_OTHER): Payer: Self-pay

## 2019-02-08 NOTE — Telephone Encounter (Signed)
I called this patient back today and I was able to tell him the results of his ultrasound regarding screening for abdominal aortic aneurysm.

## 2019-03-07 ENCOUNTER — Telehealth (HOSPITAL_COMMUNITY): Payer: Self-pay

## 2019-03-07 ENCOUNTER — Other Ambulatory Visit: Payer: Self-pay

## 2019-03-07 DIAGNOSIS — I6522 Occlusion and stenosis of left carotid artery: Secondary | ICD-10-CM

## 2019-03-07 NOTE — Telephone Encounter (Signed)

## 2019-03-07 NOTE — Progress Notes (Signed)
HISTORY AND PHYSICAL     CC:  follow up. Requesting Provider:  [name], MD  HPI: This is a 69 y.o. male here for follow up for carotid artery stenosis.  Pt is s/p left CEA for symptomatic stroke by Dr. Scot Dock on 08/01/16.  Pt was last seen February 22, 2018.  At that time, he denied any claudication sx and was walking about a mile most days or any subsequent stroke sx.    Pt is doing well.  He denies any stroke sx including weakness or paralysis of any extremity, amaurosis fugax, speech difficulties.  He denies any dizziness or heaviness of his arms.   He continues to walk a mile every day.   The pt is on a statin for cholesterol management.  The pt is on a daily aspirin.   Other AC:  Plavix The pt is on BB, ACEI for hypertension.   The pt is not diabetic.   Tobacco hx:  remote   Past Medical History:  Diagnosis Date  . Colon polyps   . CVA (cerebral vascular accident) ([place]) 06/21/2016  . Diabetes mellitus without complication ([place]) 123XX123  . Hyperlipidemia   . Hypertension   . Myocardial infarction ([place])   . Pneumonia   . Pre-diabetes   . Substance abuse [facility])     Past Surgical History:  Procedure Laterality Date  . CAROTID ENDARTERECTOMY Left   . COLON SURGERY  [phone]   growth removed, Laparoscopic Vs. open Left hemicolectomy Dr. Winter 09-25-17  . COLONOSCOPY N/A 06/26/2017   Procedure: COLONOSCOPY;  Surgeon: [name], MD;  [facility];  Service: Endoscopy;  Laterality: N/A;  730  . COLONOSCOPY N/A 12/01/2018   Procedure: COLONOSCOPY;  Surgeon: [name], MD;  [facility];  Service: Endoscopy;  Laterality: N/A;  930  . COLONOSCOPY WITH PROPOFOL N/A 10/07/2017   Procedure: COLONOSCOPY WITH PROPOFOL WITH TATTOO;  Surgeon: [name], MD;  [facility];  Service: General;  Laterality: N/A;  . ENDARTERECTOMY Left 08/01/2016   Procedure: ENDARTERECTOMY LEFT CAROTID;  Surgeon: [name], MD;   Location: [facility];  Service: Vascular;  Laterality: Left;  . FLEXIBLE SIGMOIDOSCOPY N/A 10/08/2017   Procedure: FLEXIBLE SIGMOIDOSCOPY;  Surgeon: [name], MD;  [facility];  Service: General;  Laterality: N/A;  . LAPAROSCOPIC RIGHT HEMI COLECTOMY Left 10/08/2017   Procedure: LAPAROSCOPIC VERSES OPEN LEFT HEMI COLECTOMY ERAS PATHWAY;  Surgeon: [name], MD;  [facility];  Service: General;  Laterality: Left;  . PATCH ANGIOPLASTY Left 08/01/2016   Procedure: PATCH ANGIOPLASTY LEFT CAROTID ARTERY USING ZENOSURE BIOLOGIC PATCH;  Surgeon: [name], MD;  Location: [facility];  Service: Vascular;  Laterality: Left;  . POLYPECTOMY  12/01/2018   Procedure: POLYPECTOMY;  Surgeon: [name], MD;  [facility];  Service: Endoscopy;;  . SUBMUCOSAL INJECTION  10/07/2017   Procedure: SUBMUCOSAL INJECTION;  Surgeon: [name], MD;  [facility];  Service: General;;  . TONSILLECTOMY      Allergies  Allergen Reactions  . Blood-Group Specific Substance     No whole blood products    Current Outpatient Medications  Medication Sig Dispense Refill  . aspirin EC 81 MG tablet Take 1 tablet (81 mg total) by mouth daily. 90 tablet 3  . atorvastatin (LIPITOR) 80 MG tablet TAKE 1 TABLET BY MOUTH EVERY DAY 90 tablet 0  . carvedilol (COREG) 3.125 MG tablet TAKE 1 TABLET(3.125 MG)  BY MOUTH TWICE DAILY WITH A MEAL 180 tablet 0  . Cholecalciferol (VITAMIN D3) 250 MCG (10000 UT) capsule Take 10,000 Units by mouth daily.    . clopidogrel (PLAVIX) 75 MG tablet TAKE 1 TABLET BY MOUTH DAILY 90 tablet 0  . lisinopril (ZESTRIL) 20 MG tablet TAKE 1 TABLET BY MOUTH DAILY 90 tablet 0   No current facility-administered medications for this visit.    Family History  Problem Relation Age of Onset  . Breast cancer Mother        lived to be 53  . CVA Mother   . CAD Mother   . Arthritis Mother   . Cancer Mother   . Hypertension Mother   . Other  Father        gunshot wound  . Early death Father        GSW  . Cancer Brother        Stomach Cancer  . Cancer Brother        Lung Cancer  . Stroke Maternal Aunt   . Alcohol abuse Maternal Uncle   . Stroke Maternal Grandfather   . Colon cancer Neg Hx     Social History   Socioeconomic History  . Marital status: Divorced    Spouse name: Not on file  . Number of children: 2  . Years of education: 64  . Highest education level: Not on file  Occupational History  . Occupation: disabled    Comment: alcoholic  Tobacco Use  . Smoking status: Former Smoker    Packs/day: 0.50    Years: 29.00    Pack years: 14.50    Types: Cigarettes    Quit date: 2006    Years since quitting: 15.0  . Smokeless tobacco: Never Used  Substance and Sexual Activity  . Alcohol use: Not Currently    Comment: quit 2006  . Drug use: Not Currently    Comment: remote h/o heavy marijuana use, also used cocaine and others  . Sexual activity: Not Currently  Other Topics Concern  . 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 Difficulty of Paying Living Expenses: Not on file  Food Insecurity:   . Worried About Charity fundraiser in the Last Year: Not on file  . Ran Out of Food in the Last Year: Not on file  Transportation Needs:   . Lack of Transportation (Medical): Not on file  . Lack of Transportation (Non-Medical): Not on file  Physical Activity:   . Days of Exercise per Week: Not on file  . Minutes of Exercise per Session: Not on file  Stress:   . Feeling of Stress : Not on file  Social Connections:   . Frequency of Communication with Friends and Family: Not on file  . Frequency of Social Gatherings with Friends and Family: Not on file  . Attends Religious Services: Not on file  . Active Member of  Clubs or Organizations: Not on file  . Attends Archivist Meetings: Not on file  . Marital Status: Not on file  Intimate Partner Violence:   . Fear of Current or Ex-Partner: Not on file  . Emotionally Abused: Not on file  . Physically Abused:  Not on file  . Sexually Abused: Not on file     REVIEW OF SYSTEMS:   [X]  denotes positive finding, [ ]  denotes negative finding Cardiac  Comments:  Chest pain or chest pressure:    Shortness of breath upon exertion:    Short of breath when lying flat:    Irregular heart rhythm:        Vascular    Pain in calf, thigh, or hip brought on by ambulation:    Pain in feet at night that wakes you up from your sleep:     Blood clot in your veins:    Leg swelling:         Pulmonary    Oxygen at home:    Productive cough:     Wheezing:         Neurologic    Sudden weakness in arms or legs:     Sudden numbness in arms or legs:     Sudden onset of difficulty speaking or slurred speech:    Temporary loss of vision in one eye:     Problems with dizziness:         Gastrointestinal    Blood in stool:     Vomited blood:         Genitourinary    Burning when urinating:     Blood in urine:        Psychiatric    Major depression:         Hematologic    Bleeding problems:    Problems with blood clotting too easily:        Skin    Rashes or ulcers:        Constitutional    Fever or chills:      PHYSICAL EXAMINATION:  Vitals:   03/08/19 1037 03/08/19 1041  BP: (!) 183/99 (!) 173/96  Pulse: 73   Resp: 16   Temp: 98 F (36.7 C)   SpO2: 96%      General:  WDWN in NAD; vital signs documented above Gait: Not observed HENT: WNL, normocephalic Pulmonary: normal non-labored breathing , without Rales, rhonchi,  wheezing Cardiac: regular HR, without  Murmurs, rubs or gallops; without carotid bruits Abdomen: soft, NT, no masses Skin: without rashes Vascular Exam/Pulses:  Right Left  Radial 2+ (normal) 2+ (normal)  PT 2+  (normal) 2+ (normal)   Extremities: without ischemic changes, without Gangrene , without cellulitis; without open wounds;  Musculoskeletal: no muscle wasting or atrophy  Neurologic: A&O X 3 Psychiatric:  The pt has Normal affect.   Non-Invasive Vascular Imaging:   Carotid Duplex on 03/08/2019: Right:  1-39% ICA stenosis Left:  1-39% ICA stenosis Right Carotid: Velocities in the right ICA are consistent with a 1-39% stenosis.  Left Carotid: Velocities in the left ICA are consistent with a 1-39% stenosis.               CEA patent.  Vertebrals:  Left vertebral artery demonstrates antegrade flow. Right vertebral artery demonstrates retrograde flow. Subclavians: Normal flow hemodynamics were seen in the left subclavian artery. Right subclavian artery is monophasic. The brachial and radial arteries are triphasic.  Previous Carotid duplex on 02/22/2018: Right: 1-39% ICA stenosis Left:   1-39% ICA stenosis Vertebrals:  Bilateral vertebral arteries demonstrate antegrade flow. Right              vertebral artery demonstrates high resistant flow. Subclavians: Normal flow hemodynamics were seen in bilateral subclavian  arteries.   ASSESSMENT/PLAN:: 69 y.o. male here for follow up carotid artery stenosis.  -his duplex looks good today and remains at 1-39% bilaterally.  He does have right retrograde flow with monophasic right SCA.  He is completely asymptomatic with this.  Discussed with Dr. Donnetta Hutching and would not treat this unless he becomes symptomatic.  -he will follow up in one year with duplex.  He will continue his statin and asa and daily walks.  -discussed s/s of stroke with pt and they understand should they develop any of these sx, they will go to the nearest ER.   Leontine Locket, PA-C [facility] [phone]  Clinic MD:  [place]

## 2019-03-08 ENCOUNTER — Ambulatory Visit (INDEPENDENT_AMBULATORY_CARE_PROVIDER_SITE_OTHER): Payer: Medicare HMO | Admitting: Physician Assistant

## 2019-03-08 ENCOUNTER — Other Ambulatory Visit: Payer: Self-pay

## 2019-03-08 ENCOUNTER — Ambulatory Visit (HOSPITAL_COMMUNITY)
Admission: RE | Admit: 2019-03-08 | Discharge: 2019-03-08 | Disposition: A | Discharge status: Home / Self Care | Payer: Medicare HMO | Source: Ambulatory Visit | Attending: Vascular Surgery | Admitting: Vascular Surgery

## 2019-03-08 VITALS — BP 173/96 | HR 73 | Temp 98.0°F | Resp 16 | Ht 75.0 in | Wt 212.0 lb

## 2019-03-08 DIAGNOSIS — I6522 Occlusion and stenosis of left carotid artery: Secondary | ICD-10-CM

## 2019-03-09 ENCOUNTER — Other Ambulatory Visit: Payer: Self-pay | Admitting: *Deleted

## 2019-03-09 DIAGNOSIS — H52229 Regular astigmatism, unspecified eye: Secondary | ICD-10-CM | POA: Diagnosis not present

## 2019-03-09 DIAGNOSIS — Z0100 Encounter for examination of eyes and vision without abnormal findings: Secondary | ICD-10-CM | POA: Diagnosis not present

## 2019-03-09 DIAGNOSIS — I6522 Occlusion and stenosis of left carotid artery: Secondary | ICD-10-CM

## 2019-03-14 ENCOUNTER — Other Ambulatory Visit (INDEPENDENT_AMBULATORY_CARE_PROVIDER_SITE_OTHER): Payer: Self-pay | Admitting: Internal Medicine

## 2019-03-15 ENCOUNTER — Ambulatory Visit (INDEPENDENT_AMBULATORY_CARE_PROVIDER_SITE_OTHER): Payer: Medicare HMO | Admitting: Internal Medicine

## 2019-03-15 ENCOUNTER — Encounter (INDEPENDENT_AMBULATORY_CARE_PROVIDER_SITE_OTHER): Payer: Self-pay | Admitting: Internal Medicine

## 2019-03-15 ENCOUNTER — Other Ambulatory Visit: Payer: Self-pay

## 2019-03-15 VITALS — BP 158/86 | HR 89 | Temp 97.5°F | Resp 18 | Ht 75.0 in | Wt 215.6 lb

## 2019-03-15 DIAGNOSIS — E559 Vitamin D deficiency, unspecified: Secondary | ICD-10-CM

## 2019-03-15 DIAGNOSIS — E785 Hyperlipidemia, unspecified: Secondary | ICD-10-CM

## 2019-03-15 DIAGNOSIS — I10 Essential (primary) hypertension: Secondary | ICD-10-CM

## 2019-03-15 DIAGNOSIS — E119 Type 2 diabetes mellitus without complications: Secondary | ICD-10-CM

## 2019-03-15 NOTE — Progress Notes (Signed)
Metrics: Intervention Frequency ACO  Documented Smoking Status Yearly  Screened one or more times in 24 months  Cessation Counseling or  Active cessation medication Past 24 months  Past 24 months   Guideline developer: UpToDate (See UpToDate for funding source) Date Released: 2014       Wellness Office Visit  Subjective:  Patient ID: Albert Morris, male    DOB: May 24, 1950  Age: 69 y.o. MRN: [id number]  CC: This man comes in for follow-up of diabetes, hypertension, hyperlipidemia. HPI As far as his diabetes is concerned, he is diet controlled. He continues with statin therapy for hyperlipidemia with previous history of stroke. He continues on carvedilol for his hypertension in the face of previous coronary artery disease.  He also continues on lisinopril for his hypertension. He denies any chest pain, dyspnea, palpitations or limb weakness.  Past Medical History:  Diagnosis Date  . Colon polyps   . CVA (cerebral vascular accident) ([place]) 06/21/2016  . Diabetes mellitus without complication ([place]) 123XX123  . Hyperlipidemia   . Hypertension   . Myocardial infarction ([place])   . Pneumonia   . Pre-diabetes   . Substance abuse ([place])       Family History  Problem Relation Age of Onset  . Breast cancer Mother        lived to be 61  . CVA Mother   . CAD Mother   . Arthritis Mother   . Cancer Mother   . Hypertension Mother   . Other Father        gunshot wound  . Early death Father        GSW  . Cancer Brother        Stomach Cancer  . Cancer Brother        Lung Cancer  . Stroke Maternal Aunt   . Alcohol abuse Maternal Uncle   . Stroke Maternal Grandfather   . 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 Smoking status: Former Smoker    Packs/day: 0.50    Years: 29.00     Pack years: 14.50    Types: Cigarettes    Quit date: 2006    Years since quitting: 15.0  . Smokeless tobacco: Never Used  Substance Use Topics  . Alcohol use: Not Currently    Comment: quit 2006    Current Meds  Medication Sig  . aspirin EC 81 MG tablet Take 1 tablet (81 mg total) by mouth daily.  [name] atorvastatin (LIPITOR) 80 MG tablet TAKE 1 TABLET BY MOUTH EVERY DAY  . carvedilol (COREG) 3.125 MG tablet TAKE 1 TABLET(3.125 MG) BY MOUTH TWICE DAILY WITH A MEAL  . Cholecalciferol (VITAMIN D3) 250 MCG (10000 UT) capsule Take 10,000 Units by mouth daily.  . clopidogrel (PLAVIX) 75 MG tablet TAKE 1 TABLET BY MOUTH DAILY  . lisinopril (ZESTRIL) 20 MG tablet TAKE 1 TABLET BY MOUTH DAILY       Objective:   Today's Vitals: BP (!) 158/86 (BP Location: Right Arm, Patient Position: Sitting, Cuff Size: Normal)   Pulse 89   Temp (!) 97.5 F (36.4 C) (Temporal)   Resp 18   Ht 6\' 3"  (1.905 m)   Wt  215 lb 9.6 oz (97.8 kg)   SpO2 96% Comment: wearing mask.  BMI 26.95 kg/m  Vitals with BMI 03/15/2019 03/08/2019 03/08/2019  Height 6\' 3"  - 6\' 3"   Weight 215 lbs 10 oz - 212 lbs  BMI [id number] - [id number]  Systolic 0000000 [id number]  Diastolic 86 96 99  Pulse 89 - 73     Physical Exam  He looks systemically well but his blood pressure is not as well-controlled as I would like although it is better than last time.     Assessment   1. Essential hypertension   2. Diabetes mellitus without complication ([place])   3. Vitamin D deficiency disease   4. Dyslipidemia       Tests ordered Orders Placed This Encounter  Procedures  . COMPLETE METABOLIC PANEL WITH GFR  . Hemoglobin A1c  . Lipid panel  . VITAMIN D 25 Hydroxy (Vit-D Deficiency, Fractures)     Plan: 1. Blood work is ordered as above. 2. He will continue for the time being with current medications for his hypertension and we will need to monitor this. 3. He will continue with diet only for his diabetes and we will check a hemoglobin  A1c. 4. He will continue with statin therapy and we will check a lipid panel today. 5. We will also check vitamin D levels as these of been low in the past.  He will continue with vitamin D3 10,000 units daily. 6. Further recommendations will depend on blood results and he will follow-up with Sarah in about 3 months time.  I did offer him screening for sexually transmitted infections but he declined.   No orders of the defined types were placed in this encounter.   [name], MD

## 2019-03-16 ENCOUNTER — Encounter (INDEPENDENT_AMBULATORY_CARE_PROVIDER_SITE_OTHER): Payer: Self-pay | Admitting: Internal Medicine

## 2019-03-16 LAB — COMPLETE METABOLIC PANEL WITH GFR
AG Ratio: 1.5 (calc) (ref 1.0–2.5)
ALT: 19 U/L (ref 9–46)
AST: 23 U/L (ref 10–35)
Albumin: 4.2 g/dL (ref 3.6–5.1)
Alkaline phosphatase (APISO): 89 U/L (ref 35–144)
BUN: 23 mg/dL (ref 7–25)
CO2: 25 mmol/L (ref 20–32)
Calcium: 9.1 mg/dL (ref 8.6–10.3)
Chloride: 102 mmol/L (ref 98–110)
Creat: 1.21 mg/dL (ref 0.70–1.25)
GFR, Est African American: 70 mL/min/{1.73_m2} (ref >=60)
GFR, Est Non African American: 61 mL/min/{1.73_m2} (ref >=60)
Globulin: 2.8 g/dL (calc) (ref 1.9–3.7)
Glucose, Bld: 115 mg/dL — ABNORMAL HIGH (ref 65–99)
Potassium: 4.5 mmol/L (ref 3.5–5.3)
Sodium: 136 mmol/L (ref 135–146)
Total Bilirubin: 0.4 mg/dL (ref 0.2–1.2)
Total Protein: 7 g/dL (ref 6.1–8.1)

## 2019-03-16 LAB — LIPID PANEL
Cholesterol: 136 mg/dL (ref <200)
HDL: 39 mg/dL — ABNORMAL LOW (ref >=40)
LDL Cholesterol (Calc): 76 mg/dL (calc)
Non-HDL Cholesterol (Calc): 97 mg/dL (calc) (ref <130)
Total CHOL/HDL Ratio: 3.5 (calc) (ref <5.0)
Triglycerides: 129 mg/dL (ref <150)

## 2019-03-16 LAB — HEMOGLOBIN A1C
Hgb A1c MFr Bld: 6.2 % of total Hgb — ABNORMAL HIGH (ref <5.7)
Mean Plasma Glucose: 131 (calc)
eAG (mmol/L): 7.3 (calc)

## 2019-03-16 LAB — VITAMIN D 25 HYDROXY (VIT D DEFICIENCY, FRACTURES): Vit D, 25-Hydroxy: 61 ng/mL (ref 30–100)

## 2019-04-07 ENCOUNTER — Ambulatory Visit: Payer: Medicare HMO | Admitting: Cardiovascular Disease

## 2019-04-17 ENCOUNTER — Other Ambulatory Visit (INDEPENDENT_AMBULATORY_CARE_PROVIDER_SITE_OTHER): Payer: Self-pay | Admitting: Internal Medicine

## 2019-05-18 ENCOUNTER — Ambulatory Visit: Payer: Medicare HMO | Admitting: Cardiovascular Disease

## 2019-05-18 ENCOUNTER — Other Ambulatory Visit: Payer: Self-pay

## 2019-05-18 ENCOUNTER — Encounter: Payer: Self-pay | Admitting: Cardiovascular Disease

## 2019-05-18 VITALS — BP 127/78 | HR 67 | Ht 74.0 in | Wt 215.2 lb

## 2019-05-18 DIAGNOSIS — Z8673 Personal history of transient ischemic attack (TIA), and cerebral infarction without residual deficits: Secondary | ICD-10-CM

## 2019-05-18 DIAGNOSIS — I427 Cardiomyopathy due to drug and external agent: Secondary | ICD-10-CM | POA: Diagnosis not present

## 2019-05-18 DIAGNOSIS — E785 Hyperlipidemia, unspecified: Secondary | ICD-10-CM | POA: Diagnosis not present

## 2019-05-18 DIAGNOSIS — I25118 Atherosclerotic heart disease of native coronary artery with other forms of angina pectoris: Secondary | ICD-10-CM | POA: Diagnosis not present

## 2019-05-18 DIAGNOSIS — I428 Other cardiomyopathies: Secondary | ICD-10-CM

## 2019-05-18 DIAGNOSIS — I10 Essential (primary) hypertension: Secondary | ICD-10-CM | POA: Diagnosis not present

## 2019-05-18 DIAGNOSIS — Z9889 Other specified postprocedural states: Secondary | ICD-10-CM | POA: Diagnosis not present

## 2019-05-18 NOTE — Patient Instructions (Signed)

## 2019-05-18 NOTE — Addendum Note (Signed)
Addended by: Laurine Blazer on: 05/18/2019 01:23 PM   Modules accepted: Orders

## 2019-05-18 NOTE — Progress Notes (Signed)
SUBJECTIVE: The patient presents for routine follow-up. He has a history of CVA in 06/2016, left CEA in 07/2016, cardiomyopathy (LVEF 45-50% with basal inferoseptal akinesis, mild aortic regurgitation, grade 1 diastolic dysfunction), with abnormal nuclear stress test on 07/03/16 demonstrating inferior infarct and no large ischemic zones.  The patient denies any symptoms of chest pain, palpitations, shortness of breath, lightheadedness, dizziness, leg swelling, orthopnea, PND, and syncope.  He walks a mile on most days.   Soc Hx: Retired Curator. He quit smoking and drinking alcohol in 2006.  Review of Systems: As per "subjective", otherwise negative.  Allergies  Allergen Reactions  . Blood-Group Specific Substance     No whole blood products    Current Outpatient Medications  Medication Sig Dispense Refill  . aspirin EC 81 MG tablet Take 1 tablet (81 mg total) by mouth daily. 90 tablet 3  . atorvastatin (LIPITOR) 80 MG tablet TAKE 1 TABLET BY MOUTH EVERY DAY 90 tablet 0  . carvedilol (COREG) 3.125 MG tablet TAKE 1 TABLET(3.125 MG) BY MOUTH TWICE DAILY WITH A MEAL 180 tablet 0  . Cholecalciferol (VITAMIN D3) 250 MCG (10000 UT) capsule Take 10,000 Units by mouth daily.    . clopidogrel (PLAVIX) 75 MG tablet TAKE 1 TABLET BY MOUTH DAILY 90 tablet 0  . lisinopril (ZESTRIL) 20 MG tablet TAKE 1 TABLET BY MOUTH DAILY 90 tablet 0   No current facility-administered medications for this visit.    Past Medical History:  Diagnosis Date  . Colon polyps   . CVA (cerebral vascular accident) ([place]) 06/21/2016  . Diabetes mellitus without complication ([place]) 123XX123  . Hyperlipidemia   . Hypertension   . Myocardial infarction ([place])   . Pneumonia   . Pre-diabetes   . Substance abuse [facility])     Past Surgical History:  Procedure Laterality Date  . CAROTID ENDARTERECTOMY Left   . COLON SURGERY  [phone]   growth removed, Laparoscopic Vs. open Left hemicolectomy Dr. Winter 09-25-17  .  COLONOSCOPY N/A 06/26/2017   Procedure: COLONOSCOPY;  Surgeon: [name], MD;  [facility];  Service: Endoscopy;  Laterality: N/A;  730  . COLONOSCOPY N/A 12/01/2018   Procedure: COLONOSCOPY;  Surgeon: [name], MD;  [facility];  Service: Endoscopy;  Laterality: N/A;  930  . COLONOSCOPY WITH PROPOFOL N/A 10/07/2017   Procedure: COLONOSCOPY WITH PROPOFOL WITH TATTOO;  Surgeon: [name], MD;  [facility];  Service: General;  Laterality: N/A;  . ENDARTERECTOMY Left 08/01/2016   Procedure: ENDARTERECTOMY LEFT CAROTID;  Surgeon: [name], MD;  Location: [facility];  Service: Vascular;  Laterality: Left;  . FLEXIBLE SIGMOIDOSCOPY N/A 10/08/2017   Procedure: FLEXIBLE SIGMOIDOSCOPY;  Surgeon: [name], MD;  [facility];  Service: General;  Laterality: N/A;  . LAPAROSCOPIC RIGHT HEMI COLECTOMY Left 10/08/2017   Procedure: LAPAROSCOPIC VERSES OPEN LEFT HEMI COLECTOMY ERAS PATHWAY;  Surgeon: [name], MD;  [facility];  Service: General;  Laterality: Left;  . PATCH ANGIOPLASTY Left 08/01/2016   Procedure: PATCH ANGIOPLASTY LEFT CAROTID ARTERY USING ZENOSURE BIOLOGIC PATCH;  Surgeon: [name], MD;  Location: [facility];  Service: Vascular;  Laterality: Left;  . POLYPECTOMY  12/01/2018   Procedure: POLYPECTOMY;  Surgeon: [name], MD;  [facility];  Service: Endoscopy;;  . SUBMUCOSAL INJECTION  10/07/2017   Procedure: SUBMUCOSAL INJECTION;  Surgeon: [name], MD;  [facility];  Service: General;;  .  TONSILLECTOMY      Social History   Socioeconomic History  . Marital status: Divorced    Spouse name: Not on file  . Number of children: 2  . Years of education: 84  . Highest education level: Not on file  Occupational History  . Occupation: disabled    Comment: alcoholic  Tobacco Use  . Smoking status: Former Smoker    Packs/day: 0.50    Years: 29.00     Pack years: 14.50    Types: Cigarettes    Quit date: 2006    Years since quitting: 15.2  . Smokeless tobacco: Never Used  Substance and Sexual Activity  . Alcohol use: Not Currently    Comment: quit 2006  . Drug use: Not Currently    Comment: remote h/o heavy marijuana use, also used cocaine and others  . Sexual activity: Not Currently  Other Topics Concern  . 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 Difficulty of Paying Living Expenses:   Food Insecurity:   . Worried About Charity fundraiser in the Last Year:   . Arboriculturist in the Last Year:   Transportation Needs:   . Film/video editor (Medical):   [name] Lack of Transportation (Non-Medical):   Physical Activity:   . Days of Exercise per Week:   . Minutes of Exercise per Session:   Stress:   . Feeling of Stress :   Social Connections:   . Frequency of Communication with Friends and Family:   . Frequency of Social Gatherings with Friends and Family:   . Attends Religious Services:   . Active Member of Clubs or Organizations:   . Attends Archivist Meetings:   [name] Marital Status:   Intimate Partner Violence:   . Fear of Current or Ex-Partner:   . Emotionally Abused:   [name] Physically Abused:   . Sexually Abused:       Vitals:   05/18/19 1300  BP: 127/78  Pulse: 67  SpO2: 94%  Weight: 215 lb 3.2 oz (97.6 kg)  Height: 6\' 2"  (1.88 m)    Wt Readings from Last 3 Encounters:  05/18/19 215 lb 3.2 oz (97.6 kg)  03/15/19 215 lb 9.6 oz (97.8 kg)  03/08/19 212 lb (96.2 kg)     PHYSICAL EXAM General: NAD HEENT: Normal. Neck: No JVD, no thyromegaly. Lungs: Clear to auscultation bilaterally with normal respiratory effort. CV: Regular rate and rhythm, normal S1/S2, no [id number], soft systolicmurmurover  right upper sternal border. No pretibial or periankle edema.  No carotid bruit.   Abdomen: Soft, nontender, no distention.  Neurologic: Alert and oriented.  Psych: Normal affect. Skin: Normal. Musculoskeletal: No gross deformities.     Labs: Lab Results  Component Value Date/Time   K 4.5 03/15/2019 11:46 AM   BUN 23 03/15/2019 11:46 AM   CREATININE 1.21 03/15/2019 11:46 AM   ALT 19 03/15/2019 11:46 AM   TSH 3.086 06/24/2016 10:49 PM   HGB 11.4 (L) 10/12/2017 04:26 AM     Lipids: Lab Results  Component Value Date/Time   LDLCALC 76 03/15/2019 11:46 AM   CHOL 136 03/15/2019 11:46 AM   TRIG 129 03/15/2019 11:46 AM  HDL 39 (L) 03/15/2019 11:46 AM       ASSESSMENT AND PLAN:  1.Coronary artery disease: He has an ischemic cardiomyopathy. He is symptomatically stable. Continue ASA, carvedilol, atorvastatin, and lisinopril. I will obtain an echocardiogram to assess for interval changes in cardiac structure and function.  2.Hypertension:  Blood pressure is normal.  No changes to therapy.  3.Hyperlipidemia: Continue atorvastatin 80 mg. Lipids reviewed above.  4. History of CVA: Continue aspirin 81 mg and  atorvastatin 80 mg. He is also on clopidogrel as prescribed by neurology.  5.Peripheral vascular disease: S/p left CEA on 08/01/16.  Continue aspirin 81 mg and  atorvastatin 80 mg.     Disposition: Follow up 1 year   Kate Sable, M.D., F.A.C.C.

## 2019-05-25 ENCOUNTER — Other Ambulatory Visit: Payer: Self-pay

## 2019-05-25 ENCOUNTER — Ambulatory Visit (HOSPITAL_COMMUNITY)
Admission: RE | Admit: 2019-05-25 | Discharge: 2019-05-25 | Disposition: A | Discharge status: Home / Self Care | Payer: Medicare HMO | Source: Ambulatory Visit | Attending: Cardiovascular Disease | Admitting: Cardiovascular Disease

## 2019-05-25 DIAGNOSIS — I428 Other cardiomyopathies: Secondary | ICD-10-CM | POA: Insufficient documentation

## 2019-05-25 NOTE — Progress Notes (Signed)
*  PRELIMINARY RESULTS* Echocardiogram 2D Echocardiogram has been performed.  Albert Morris 05/25/2019, 2:57 PM

## 2019-06-13 ENCOUNTER — Other Ambulatory Visit (INDEPENDENT_AMBULATORY_CARE_PROVIDER_SITE_OTHER): Payer: Self-pay | Admitting: Internal Medicine

## 2019-06-14 ENCOUNTER — Ambulatory Visit (INDEPENDENT_AMBULATORY_CARE_PROVIDER_SITE_OTHER): Payer: Medicare HMO | Admitting: Nurse Practitioner

## 2019-07-11 ENCOUNTER — Encounter (INDEPENDENT_AMBULATORY_CARE_PROVIDER_SITE_OTHER): Payer: Self-pay | Admitting: Nurse Practitioner

## 2019-07-11 ENCOUNTER — Other Ambulatory Visit: Payer: Self-pay

## 2019-07-11 ENCOUNTER — Ambulatory Visit (INDEPENDENT_AMBULATORY_CARE_PROVIDER_SITE_OTHER): Payer: Medicare HMO | Admitting: Nurse Practitioner

## 2019-07-11 VITALS — BP 125/75 | HR 61 | Temp 98.0°F | Ht 75.0 in | Wt 213.8 lb

## 2019-07-11 DIAGNOSIS — E119 Type 2 diabetes mellitus without complications: Secondary | ICD-10-CM | POA: Diagnosis not present

## 2019-07-11 DIAGNOSIS — I10 Essential (primary) hypertension: Secondary | ICD-10-CM | POA: Diagnosis not present

## 2019-07-11 DIAGNOSIS — E559 Vitamin D deficiency, unspecified: Secondary | ICD-10-CM

## 2019-07-11 DIAGNOSIS — Z0000 Encounter for general adult medical examination without abnormal findings: Secondary | ICD-10-CM

## 2019-07-11 DIAGNOSIS — E785 Hyperlipidemia, unspecified: Secondary | ICD-10-CM | POA: Diagnosis not present

## 2019-07-11 DIAGNOSIS — I700 Atherosclerosis of aorta: Secondary | ICD-10-CM

## 2019-07-11 NOTE — Progress Notes (Signed)
Subjective:  Patient ID: Albert Morris, male    DOB: May 24, 1950  Age: 69 y.o. MRN: [id number]  CC:  Chief Complaint  Patient presents with  . Hypertension  . Vitamin D Deficiency  . Diabetes  . Follow-up  . Other    Health maintenance  . Hyperlipidemia      HPI  This patient arrives today for follow-up of the above.  Hypertension: Has a history of hypertension and continues on carvedilol 3.125 mg twice daily and lisinopril 20 mg daily.  Last metabolic panel was collected in January 2020 and showed normal potassium and renal function.  Vitamin D deficiency: He has a history of vitamin D deficiency.  He continues on 10,000 IUs of vitamin D3 daily.  Last serum level was collected in January 2021 and showed serum level of 61.  Type 2 diabetes: He has a history of type 2 diabetes, currently he is controlled with diet and exercise.  Last A1c was collected in January 2021 and it was 6.2.  Hyperlipidemia: He also has a history of hyperlipidemia.  He continues on his atorvastatin.  Last lipid panel showed the following: Lipid Panel         Component                Value               Date/Time                 CHOL                     136                 03/15/2019 1146           TRIG                     129                 03/15/2019 1146           HDL                      39 (L)              03/15/2019 1146           CHOLHDL                  3.5                 03/15/2019 1146           VLDL                     13                  09/17/2016 0720           LDLCALC                  76                  03/15/2019 1146         Health maintenance: Last time I spoke to this patient we had done his annual wellness visit.  At that point it was determined that he was due for Pneumovax 23, and he also expressed interest in being screened for sexual transmitted infections.  Today, he tells me that he would  not like the Pneumovax vaccine administered and he would not like to be  screened for sexual transmitted infections.    Past Medical History:  Diagnosis Date  . Colon polyps   . CVA (cerebral vascular accident) ([place]) 06/21/2016  . Diabetes mellitus without complication ([place]) 9/45/8592  . Hyperlipidemia   . Hypertension   . Myocardial infarction ([place])   . Pneumonia   . Pre-diabetes   . Substance abuse ([place])       Family History  Problem Relation Age of Onset  . Breast cancer Mother        lived to be 46  . CVA Mother   . CAD Mother   . Arthritis Mother   . Cancer Mother   . Hypertension Mother   . Other Father        gunshot wound  . Early death Father        GSW  . Cancer Brother        Stomach Cancer  . Cancer Brother        Lung Cancer  . Stroke Maternal Aunt   . Alcohol abuse Maternal Uncle   . Stroke Maternal Grandfather   . 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 Smoking status: Former Smoker    Packs/day: 0.50    Years: 29.00    Pack years: 14.50    Types: Cigarettes    Quit date: 2006    Years since quitting: 15.4  . Smokeless tobacco: Never Used  Substance Use Topics  . Alcohol use: Not Currently    Comment: quit 2006     Current Meds  Medication Sig  . aspirin EC 81 MG tablet Take 1 tablet (81 mg total) by mouth daily.  [name] atorvastatin (LIPITOR) 80 MG tablet TAKE 1 TABLET BY MOUTH EVERY DAY  . carvedilol (COREG) 3.125 MG tablet TAKE 1 TABLET(3.125 MG) BY MOUTH TWICE DAILY WITH A MEAL  . Cholecalciferol (VITAMIN D3) 250 MCG (10000 UT) capsule Take 10,000 Units by mouth daily.  . clopidogrel (PLAVIX) 75 MG tablet TAKE 1 TABLET BY MOUTH DAILY  . lisinopril (ZESTRIL) 20 MG tablet TAKE 1 TABLET BY MOUTH DAILY    ROS:  Review of Systems  Constitutional: Negative for fever and malaise/fatigue.  Respiratory: Negative  for cough, shortness of breath and wheezing.   Cardiovascular: Negative for chest pain, palpitations, orthopnea and leg swelling.  Neurological: Negative for dizziness and headaches.     Objective:   Today's Vitals: BP 125/75 (BP Location: Left Arm, Patient Position: Sitting, Cuff Size: Normal)   Pulse 61   Temp 98 F (36.7 C) (Temporal)   Ht _0  (1.905 m)   Wt 213 lb 12.8 oz (97 kg)   SpO2 96%   BMI 26.72 kg/m  Vitals with BMI 07/11/2019 05/18/2019 03/15/2019  Height _1  _2  _3   Weight 213 lbs 13 oz 215 lbs 3 oz 215 lbs 10 oz  BMI 26.72 92.44 62.86  Systolic 381 771 165  Diastolic 75 78 86  Pulse 61 67 89     Physical Exam Vitals reviewed.  Constitutional:      Appearance: Normal appearance.  HENT:  Head: Normocephalic and atraumatic.  Cardiovascular:     Rate and Rhythm: Normal rate and regular rhythm.     Heart sounds: Murmur present.  Pulmonary:     Effort: Pulmonary effort is normal.     Breath sounds: Normal breath sounds.  Musculoskeletal:     Cervical back: Neck supple.  Skin:    General: Skin is warm and dry.  Neurological:     Mental Status: He is alert and oriented to person, place, and time.  Psychiatric:        Mood and Affect: Mood normal.        Behavior: Behavior normal.        Thought Content: Thought content normal.        Judgment: Judgment normal.          Assessment and Plan   1. Essential hypertension   2. Diabetes mellitus without complication ([place])   3. Vitamin D deficiency disease   4. Dyslipidemia   5. Atherosclerosis of aorta ([place])   6. Healthcare maintenance      Plan: 1.  He will continue taking his medications as prescribed.  2.  I will collect A1c for further evaluation to make sure that his diabetes continues to be controlled.  He will continue on his ACE and statin.  He will be due for foot exam at next office visit.  3.  He will continue on his current vitamin D3 supplement.  We will check serum level  today as well.  4., 5.  He will continue on his current medication regimen as prescribed to control his dyslipidemia and atherosclerosis.  6.  Per his request I will not administer Pneumovax 23 nor will I screened him for STIs today.  Tests ordered Orders Placed This Encounter  Procedures  . Hemoglobin A1c  . Vitamin D, 25-hydroxy  . CMP with eGFR([facility])      No orders of the defined types were placed in this encounter.   Patient to follow-up in 3 months or sooner as needed.  Ailene Ards, NP

## 2019-07-12 LAB — COMPLETE METABOLIC PANEL WITH GFR
AG Ratio: 1.5 (calc) (ref 1.0–2.5)
ALT: 25 U/L (ref 9–46)
AST: 26 U/L (ref 10–35)
Albumin: 4.2 g/dL (ref 3.6–5.1)
Alkaline phosphatase (APISO): 69 U/L (ref 35–144)
BUN/Creatinine Ratio: 17 (calc) (ref 6–22)
BUN: 23 mg/dL (ref 7–25)
CO2: 28 mmol/L (ref 20–32)
Calcium: 9.7 mg/dL (ref 8.6–10.3)
Chloride: 100 mmol/L (ref 98–110)
Creat: 1.35 mg/dL — ABNORMAL HIGH (ref 0.70–1.25)
GFR, Est African American: 62 mL/min/{1.73_m2} (ref >=60)
GFR, Est Non African American: 53 mL/min/{1.73_m2} — ABNORMAL LOW (ref >=60)
Globulin: 2.8 g/dL (calc) (ref 1.9–3.7)
Glucose, Bld: 145 mg/dL — ABNORMAL HIGH (ref 65–99)
Potassium: 5.1 mmol/L (ref 3.5–5.3)
Sodium: 135 mmol/L (ref 135–146)
Total Bilirubin: 0.5 mg/dL (ref 0.2–1.2)
Total Protein: 7 g/dL (ref 6.1–8.1)

## 2019-07-12 LAB — VITAMIN D 25 HYDROXY (VIT D DEFICIENCY, FRACTURES): Vit D, 25-Hydroxy: 62 ng/mL (ref 30–100)

## 2019-07-12 LAB — HEMOGLOBIN A1C
Hgb A1c MFr Bld: 6.3 % of total Hgb — ABNORMAL HIGH (ref <5.7)
Mean Plasma Glucose: 134 (calc)
eAG (mmol/L): 7.4 (calc)

## 2019-07-21 ENCOUNTER — Other Ambulatory Visit (INDEPENDENT_AMBULATORY_CARE_PROVIDER_SITE_OTHER): Payer: Self-pay | Admitting: Internal Medicine

## 2019-09-01 ENCOUNTER — Other Ambulatory Visit (INDEPENDENT_AMBULATORY_CARE_PROVIDER_SITE_OTHER): Payer: Self-pay | Admitting: Internal Medicine

## 2019-10-18 ENCOUNTER — Ambulatory Visit (INDEPENDENT_AMBULATORY_CARE_PROVIDER_SITE_OTHER): Payer: Medicare HMO | Admitting: Nurse Practitioner

## 2019-10-20 ENCOUNTER — Other Ambulatory Visit (INDEPENDENT_AMBULATORY_CARE_PROVIDER_SITE_OTHER): Payer: Self-pay | Admitting: Internal Medicine

## 2019-11-02 ENCOUNTER — Other Ambulatory Visit: Payer: Self-pay

## 2019-11-02 ENCOUNTER — Encounter (INDEPENDENT_AMBULATORY_CARE_PROVIDER_SITE_OTHER): Payer: Self-pay | Admitting: Nurse Practitioner

## 2019-11-02 ENCOUNTER — Ambulatory Visit (INDEPENDENT_AMBULATORY_CARE_PROVIDER_SITE_OTHER): Payer: Medicare HMO | Admitting: Nurse Practitioner

## 2019-11-02 VITALS — BP 128/70 | HR 63 | Temp 97.3°F | Resp 18 | Ht 74.0 in | Wt 204.6 lb

## 2019-11-02 DIAGNOSIS — E559 Vitamin D deficiency, unspecified: Secondary | ICD-10-CM | POA: Diagnosis not present

## 2019-11-02 DIAGNOSIS — E785 Hyperlipidemia, unspecified: Secondary | ICD-10-CM | POA: Diagnosis not present

## 2019-11-02 DIAGNOSIS — E119 Type 2 diabetes mellitus without complications: Secondary | ICD-10-CM

## 2019-11-02 DIAGNOSIS — L602 Onychogryphosis: Secondary | ICD-10-CM

## 2019-11-02 DIAGNOSIS — Z7185 Encounter for immunization safety counseling: Secondary | ICD-10-CM

## 2019-11-02 DIAGNOSIS — Z7189 Other specified counseling: Secondary | ICD-10-CM | POA: Diagnosis not present

## 2019-11-02 DIAGNOSIS — I10 Essential (primary) hypertension: Secondary | ICD-10-CM

## 2019-11-02 NOTE — Progress Notes (Signed)
Subjective:  Patient ID: Albert Morris, male    DOB: [birth year]  Age: 69 y.o. MRN: [id number]  CC:  Chief Complaint  Patient presents with  . Follow-up  . Other    Vaccination questions, vitamin D deficiency  . Hypertension  . Hyperlipidemia  . Diabetes      HPI  This patient arrives today for the above. Vaccination questions: He has not taken the COVID-19 vaccines.  He tells me he has a history of cardiac valvular dysfunction and he is concerned about possibility of myocarditis from the vaccines so he would like to discuss this further today.  He is not interested in having the flu or pneumonia shot administered.  Vitamin D deficiency: Last serum level was checked approximately 2 months ago which did show a level of 62.  He continues on his vitamin D3 supplement.  Hypertension: He continues on his antihypertensives without negative side effects.  Hyperlipidemia: Lipid panel from earlier this year did show LDL of 76.  He continues on his aspirin and atorvastatin.  Type 2 diabetes: Last A1c was collected approximately 4 months ago and it was 6.3.  He is not currently taking medication to control his diabetes.  He has been making changes to his diet and is eating a tossed salad once per day.  He tells me he has lost some weight since making this change.  He will be due for eye exam again early 2022.  He is on ACE inhibitor and statin therapy.  He is due for foot exam today.  Past Medical History:  Diagnosis Date  . Colon polyps   . CVA (cerebral vascular accident) ([place]) 06/21/2016  . Diabetes mellitus without complication ([place]) 9/67/5916  . Hyperlipidemia   . Hypertension   . Myocardial infarction ([place])   . Pneumonia   . Pre-diabetes   . Substance abuse ([place])       Family History  Problem Relation Age of Onset  . Breast cancer Mother        lived to be 39  . CVA Mother   . CAD Mother   . Arthritis Mother   . Cancer Mother   . Hypertension Mother   .  Other Father        gunshot wound  . Early death Father        GSW  . Cancer Brother        Stomach Cancer  . Cancer Brother        Lung Cancer  . Stroke Maternal Aunt   . Alcohol abuse Maternal Uncle   . Stroke Maternal Grandfather   . 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 Smoking status: Former Smoker    Packs/day: 0.50    Years: 29.00    Pack years: 14.50    Types: Cigarettes    Quit date: 2006    Years since quitting: 15.7  . Smokeless tobacco: Never Used  Substance Use Topics  . Alcohol use: Not Currently    Comment: quit 2006     Current Meds  Medication Sig  . aspirin EC 81 MG tablet Take 1 tablet (81 mg total) by mouth daily.  [name]  atorvastatin (LIPITOR) 80 MG tablet TAKE 1 TABLET BY MOUTH EVERY DAY  . carvedilol (COREG) 3.125 MG tablet TAKE 1 TABLET(3.125 MG) BY MOUTH TWICE DAILY WITH A MEAL  . Cholecalciferol (VITAMIN D3) 250 MCG (10000 UT) capsule Take 10,000 Units by mouth daily.  . clopidogrel (PLAVIX) 75 MG tablet TAKE 1 TABLET BY MOUTH DAILY  . lisinopril (ZESTRIL) 20 MG tablet TAKE 1 TABLET BY MOUTH DAILY    ROS:  Review of Systems  Constitutional: Negative for fever and malaise/fatigue.  Respiratory: Negative for shortness of breath.   Cardiovascular: Negative for chest pain.  Gastrointestinal: Negative for abdominal pain and blood in stool.  Neurological: Negative for dizziness and headaches.     Objective:   Today's Vitals: BP 128/70   Pulse 63   Temp (!) 97.3 F (36.3 C) (Temporal)   Resp 18   Ht 6' 2" (1.88 m)   Wt 204 lb 9.6 oz (92.8 kg)   SpO2 98%   BMI 26.27 kg/m  Vitals with BMI 11/02/2019 07/11/2019 05/18/2019  Height 6' 2" 6' 3" 6' 2"  Weight 204 lbs 10 oz 213 lbs 13 oz 215 lbs 3 oz  BMI 26.26 17.51 02.58  Systolic 527 782 423   Diastolic 70 75 78  Pulse 63 61 67     Physical Exam Vitals reviewed.  Constitutional:      Appearance: Normal appearance.  HENT:     Head: Normocephalic and atraumatic.  Cardiovascular:     Rate and Rhythm: Normal rate and regular rhythm.     Pulses:          Dorsalis pedis pulses are 1+ on the right side and 1+ on the left side.  Pulmonary:     Effort: Pulmonary effort is normal.     Breath sounds: Normal breath sounds.  Musculoskeletal:     Cervical back: Neck supple.     Right foot: No deformity.     Left foot: No deformity.  Feet:     Right foot:     Protective Sensation: 10 sites tested. 4 sites sensed.     Skin integrity: Skin integrity normal.     Toenail Condition: Right toenails are abnormally thick and long.     Left foot:     Protective Sensation: 10 sites tested. 4 sites sensed.     Skin integrity: Skin integrity normal.     Toenail Condition: Left toenails are abnormally thick and long.  Skin:    General: Skin is warm and dry.  Neurological:     Mental Status: He is alert and oriented to person, place, and time.  Psychiatric:        Mood and Affect: Mood normal.        Behavior: Behavior normal.        Thought Content: Thought content normal.        Judgment: Judgment normal.          Assessment and Plan   1. Onychauxis   2. Diabetes mellitus without complication ([place])   3. Vitamin D deficiency disease   4. Vaccine counseling   5. Essential hypertension   6. Dyslipidemia      Plan: 1, 2.  Foot exam did show abnormally long and thick toenails.  He also has reduced sensation to his feet.  Will refer him to podiatry for further management of his toenails.  We will also check A1c and urine for albuminuria today.  He will continue on his ACE inhibitor and statin  therapy as currently prescribed. 3.  We will check a vitamin D3 serum today for further evaluation. 4.  We did discuss the vaccine and that history of myocardial infarction or valvular  disease does not pose a contraindication to getting the COVID-19 vaccinations.  However there is a rare but possible side effect of myocarditis with these vaccinations, and there is a risk of myocarditis if he develops COVID-19.  He was told that if he decides not to get vaccinated that he should continue to practice social distancing, wear a mask when in public, and proceed for evaluation if he is concerned that he has been exposed or starts to experience symptoms of COVID-19.  He tells me he understands.  Patient was also asked about flu shot he has declined to have this administered today. 5.  He will continue on his current antihypertensives as prescribed. 6.  He will continue on his atorvastatin and aspirin as currently prescribed.   Tests ordered Orders Placed This Encounter  Procedures  . CMP with eGFR([facility])  . Hemoglobin A1c  . Vitamin D, 25-hydroxy  . Microalbumin/Creatinine Ratio, Urine  . Ambulatory referral to Podiatry      No orders of the defined types were placed in this encounter.   Patient to follow-up in 3 months or sooner as needed.  Ailene Ards, NP

## 2019-11-03 ENCOUNTER — Encounter (INDEPENDENT_AMBULATORY_CARE_PROVIDER_SITE_OTHER): Payer: Self-pay | Admitting: Nurse Practitioner

## 2019-11-03 LAB — COMPLETE METABOLIC PANEL WITH GFR
AG Ratio: 1.6 (calc) (ref 1.0–2.5)
ALT: 29 U/L (ref 9–46)
AST: 26 U/L (ref 10–35)
Albumin: 4.5 g/dL (ref 3.6–5.1)
Alkaline phosphatase (APISO): 62 U/L (ref 35–144)
BUN: 19 mg/dL (ref 7–25)
CO2: 29 mmol/L (ref 20–32)
Calcium: 9.6 mg/dL (ref 8.6–10.3)
Chloride: 102 mmol/L (ref 98–110)
Creat: 1.13 mg/dL (ref 0.70–1.18)
GFR, Est African American: 76 mL/min/{1.73_m2} (ref >=60)
GFR, Est Non African American: 65 mL/min/{1.73_m2} (ref >=60)
Globulin: 2.9 g/dL (calc) (ref 1.9–3.7)
Glucose, Bld: 93 mg/dL (ref 65–99)
Potassium: 4.6 mmol/L (ref 3.5–5.3)
Sodium: 136 mmol/L (ref 135–146)
Total Bilirubin: 0.7 mg/dL (ref 0.2–1.2)
Total Protein: 7.4 g/dL (ref 6.1–8.1)

## 2019-11-03 LAB — MICROALBUMIN / CREATININE URINE RATIO
Creatinine, Urine: 222 mg/dL (ref 20–320)
Microalb Creat Ratio: 10 mcg/mg creat (ref <30)
Microalb, Ur: 2.2 mg/dL

## 2019-11-03 LAB — HEMOGLOBIN A1C
Hgb A1c MFr Bld: 6.4 % of total Hgb — ABNORMAL HIGH (ref <5.7)
Mean Plasma Glucose: 137 (calc)
eAG (mmol/L): 7.6 (calc)

## 2019-11-03 LAB — VITAMIN D 25 HYDROXY (VIT D DEFICIENCY, FRACTURES): Vit D, 25-Hydroxy: 77 ng/mL (ref 30–100)

## 2019-11-23 ENCOUNTER — Other Ambulatory Visit (INDEPENDENT_AMBULATORY_CARE_PROVIDER_SITE_OTHER): Payer: Self-pay | Admitting: Internal Medicine

## 2019-11-24 ENCOUNTER — Other Ambulatory Visit: Payer: Self-pay

## 2019-11-24 ENCOUNTER — Encounter (INDEPENDENT_AMBULATORY_CARE_PROVIDER_SITE_OTHER): Payer: Self-pay | Admitting: Internal Medicine

## 2019-11-24 ENCOUNTER — Ambulatory Visit (INDEPENDENT_AMBULATORY_CARE_PROVIDER_SITE_OTHER): Payer: Medicare HMO | Admitting: Internal Medicine

## 2019-11-24 VITALS — BP 160/80 | HR 64 | Ht 74.0 in | Wt 205.6 lb

## 2019-11-24 DIAGNOSIS — H5711 Ocular pain, right eye: Secondary | ICD-10-CM | POA: Diagnosis not present

## 2019-11-24 DIAGNOSIS — I10 Essential (primary) hypertension: Secondary | ICD-10-CM

## 2019-11-24 NOTE — Progress Notes (Signed)
Metrics: Intervention Frequency ACO  Documented Smoking Status Yearly  Screened one or more times in 24 months  Cessation Counseling or  Active cessation medication Past 24 months  Past 24 months   Guideline developer: UpToDate (See UpToDate for funding source) Date Released: 2014       Wellness Office Visit  Subjective:  Patient ID: Albert Morris, male    DOB: 04/25/1950  Age: 69 y.o. MRN: [id number]  CC: Right eye/right temporal pain. HPI  This patient comes in his acute visit with the above symptoms which have been present since yesterday morning.  In fact, he feels that the symptoms of improved since that time but he describes a throbbing pain in this area.  He has had no visual symptoms whatsoever.  He did check his blood pressure at home and it was relatively high.  He does not have and did not have any limb weakness of any sort. Past Medical History:  Diagnosis Date  . Colon polyps   . CVA (cerebral vascular accident) ([place]) 06/21/2016  . Diabetes mellitus without complication ([place]) 7/40/8144  . Hyperlipidemia   . Hypertension   . Myocardial infarction ([place])   . Pneumonia   . Pre-diabetes   . Substance abuse [facility])    Past Surgical History:  Procedure Laterality Date  . CAROTID ENDARTERECTOMY Left   . COLON SURGERY  [phone]   growth removed, Laparoscopic Vs. open Left hemicolectomy Dr. Winter 09-25-17  . COLONOSCOPY N/A 06/26/2017   Procedure: COLONOSCOPY;  Surgeon: [name], MD;  [facility];  Service: Endoscopy;  Laterality: N/A;  730  . COLONOSCOPY N/A 12/01/2018   Procedure: COLONOSCOPY;  Surgeon: [name], MD;  [facility];  Service: Endoscopy;  Laterality: N/A;  930  . COLONOSCOPY WITH PROPOFOL N/A 10/07/2017   Procedure: COLONOSCOPY WITH PROPOFOL WITH TATTOO;  Surgeon: [name], MD;  Location: [facility];  Service: General;  Laterality: N/A;  . ENDARTERECTOMY Left 08/01/2016   Procedure: ENDARTERECTOMY LEFT  CAROTID;  Surgeon: [name], MD;  Location: [place];  Service: Vascular;  Laterality: Left;  . FLEXIBLE SIGMOIDOSCOPY N/A 10/08/2017   Procedure: FLEXIBLE SIGMOIDOSCOPY;  Surgeon: [name], MD;  Location: [facility];  Service: General;  Laterality: N/A;  . LAPAROSCOPIC RIGHT HEMI COLECTOMY Left 10/08/2017   Procedure: LAPAROSCOPIC VERSES OPEN LEFT HEMI COLECTOMY ERAS PATHWAY;  Surgeon: [name], MD;  Location: [facility];  Service: General;  Laterality: Left;  . PATCH ANGIOPLASTY Left 08/01/2016   Procedure: PATCH ANGIOPLASTY LEFT CAROTID ARTERY USING ZENOSURE BIOLOGIC PATCH;  Surgeon: [name], MD;  Location: [place];  Service: Vascular;  Laterality: Left;  . POLYPECTOMY  12/01/2018   Procedure: POLYPECTOMY;  Surgeon: [name], MD;  [facility];  Service: Endoscopy;;  . SUBMUCOSAL INJECTION  10/07/2017   Procedure: SUBMUCOSAL INJECTION;  Surgeon: [name], MD;  Location: [facility];  Service: General;;  . TONSILLECTOMY       Family History  Problem Relation Age of Onset  . Breast cancer Mother        lived to be 5  . CVA Mother   . CAD Mother   . Arthritis Mother   . Cancer Mother   . Hypertension Mother   . Other Father        gunshot wound  . Early death Father        GSW  . Cancer Brother  Stomach Cancer  . Cancer Brother        Lung Cancer  . Stroke Maternal Aunt   . Alcohol abuse Maternal Uncle   . Stroke Maternal Grandfather   . 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 Smoking status: Former Smoker    Packs/day: 0.50    Years: 29.00    Pack years: 14.50    Types: Cigarettes    Quit date: 2006    Years since quitting: 15.7  . Smokeless tobacco: Never Used  Substance Use Topics  . Alcohol  use: Not Currently    Comment: quit 2006    Current Meds  Medication Sig  . aspirin EC 81 MG tablet Take 1 tablet (81 mg total) by mouth daily.  [name] atorvastatin (LIPITOR) 80 MG tablet TAKE 1 TABLET BY MOUTH EVERY DAY  . carvedilol (COREG) 3.125 MG tablet TAKE 1 TABLET(3.125 MG) BY MOUTH TWICE DAILY WITH A MEAL  . Cholecalciferol (VITAMIN D3) 250 MCG (10000 UT) capsule Take 10,000 Units by mouth daily.  . clopidogrel (PLAVIX) 75 MG tablet TAKE 1 TABLET BY MOUTH DAILY  . lisinopril (ZESTRIL) 20 MG tablet TAKE 1 TABLET BY MOUTH DAILY      Depression screen [facility] 2/9 07/11/2019 01/26/2019 04/16/2017 10/14/2016 07/08/2016  Decreased Interest 0 0 0 0 0  Down, Depressed, Hopeless 0 0 0 0 0  PHQ - 2 Score 0 0 0 0 0     Objective:   Today's Vitals: BP (!) 160/80   Pulse 64   Ht 6\' 2"  (1.88 m)   Wt 205 lb 9.6 oz (93.3 kg)   SpO2 96%   BMI 26.40 kg/m  Vitals with BMI 11/24/2019 11/02/2019 07/11/2019  Height 6\' 2"  6\' 2"  6\' 3"   Weight 205 lbs 10 oz 204 lbs 10 oz 213 lbs 13 oz  BMI 26.39 42.59 56.38  Systolic 756 433 295  Diastolic 80 70 75  Pulse 64 63 61     Physical Exam  He looks systemically well.  Blood pressure is elevated today.  He does not have any temporal artery tenderness.  There are no focal neurological signs.  External ocular movements are normal and there are no visual field defects.  Pupils are equal and reactive to light.  His vision appears to be normal.     Assessment   1. Pain, eye, right   2. Essential hypertension       Tests ordered No orders of the defined types were placed in this encounter.    Plan: 1. I am not quite sure the etiology of this pain near the right eye and temple area but it seems to be improving.  I have told him that should it recur and get worse, he should probably go to the emergency room for further evaluation. 2. His hypertension is not controlled and I have told him to take lisinopril 20 mg twice a day and keep a close monitor on  his blood pressure at home. 3. I will see him in a couple of weeks time for close follow-up.   No orders of the defined types were placed in this encounter.   [name], MD

## 2019-12-13 ENCOUNTER — Ambulatory Visit (INDEPENDENT_AMBULATORY_CARE_PROVIDER_SITE_OTHER): Payer: Medicare HMO | Admitting: Internal Medicine

## 2019-12-13 ENCOUNTER — Other Ambulatory Visit: Payer: Self-pay

## 2019-12-13 ENCOUNTER — Encounter (INDEPENDENT_AMBULATORY_CARE_PROVIDER_SITE_OTHER): Payer: Self-pay | Admitting: Internal Medicine

## 2019-12-13 VITALS — BP 130/80 | HR 62 | Temp 96.9°F | Ht 74.0 in | Wt 205.8 lb

## 2019-12-13 DIAGNOSIS — I10 Essential (primary) hypertension: Secondary | ICD-10-CM

## 2019-12-13 NOTE — Progress Notes (Signed)
Metrics: Intervention Frequency ACO  Documented Smoking Status Yearly  Screened one or more times in 24 months  Cessation Counseling or  Active cessation medication Past 24 months  Past 24 months   Guideline developer: UpToDate (See UpToDate for funding source) Date Released: 2014       Wellness Office Visit  Subjective:  Patient ID: Albert Morris, male    DOB: 1950-08-04  Age: 69 y.o. MRN: [id number]  CC: This man comes in for follow-up of uncontrolled hypertension. HPI  I had increased his lisinopril dose to 20 mg twice a day and he says he has not been taking it every single day but mostly he has been taking it on most days.  He has tolerated the dose. Past Medical History:  Diagnosis Date  . Colon polyps   . CVA (cerebral vascular accident) ([place]) 06/21/2016  . Diabetes mellitus without complication ([place]) 05/31/2438  . Hyperlipidemia   . Hypertension   . Myocardial infarction ([place])   . Pneumonia   . Pre-diabetes   . Substance abuse [facility])    Past Surgical History:  Procedure Laterality Date  . CAROTID ENDARTERECTOMY Left   . COLON SURGERY  [phone]   growth removed, Laparoscopic Vs. open Left hemicolectomy Dr. Winter 09-25-17  . COLONOSCOPY N/A 06/26/2017   Procedure: COLONOSCOPY;  Surgeon: [name], MD;  [facility];  Service: Endoscopy;  Laterality: N/A;  730  . COLONOSCOPY N/A 12/01/2018   Procedure: COLONOSCOPY;  Surgeon: [name], MD;  [facility];  Service: Endoscopy;  Laterality: N/A;  930  . COLONOSCOPY WITH PROPOFOL N/A 10/07/2017   Procedure: COLONOSCOPY WITH PROPOFOL WITH TATTOO;  Surgeon: [name], MD;  Location[facility];  Service: General;  Laterality: N/A;  . ENDARTERECTOMY Left 08/01/2016   Procedure: ENDARTERECTOMY LEFT CAROTID;  Surgeon: [name], MD;  Location: [place];  Service: Vascular;  Laterality: Left;  . FLEXIBLE SIGMOIDOSCOPY N/A 10/08/2017   Procedure: FLEXIBLE SIGMOIDOSCOPY;   Surgeon: [name], MD;  [facility];  Service: General;  Laterality: N/A;  . LAPAROSCOPIC RIGHT HEMI COLECTOMY Left 10/08/2017   Procedure: LAPAROSCOPIC VERSES OPEN LEFT HEMI COLECTOMY ERAS PATHWAY;  Surgeon: [name], MD;  [facility];  Service: General;  Laterality: Left;  . PATCH ANGIOPLASTY Left 08/01/2016   Procedure: PATCH ANGIOPLASTY LEFT CAROTID ARTERY USING ZENOSURE BIOLOGIC PATCH;  Surgeon: [name], MD;  Location: [place];  Service: Vascular;  Laterality: Left;  . POLYPECTOMY  12/01/2018   Procedure: POLYPECTOMY;  Surgeon: [name], MD;  [facility];  Service: Endoscopy;;  . SUBMUCOSAL INJECTION  10/07/2017   Procedure: SUBMUCOSAL INJECTION;  Surgeon: [name], MD;  Location: [facility];  Service: General;;  . TONSILLECTOMY       Family History  Problem Relation Age of Onset  . Breast cancer Mother        lived to be 78  . CVA Mother   . CAD Mother   . Arthritis Mother   . Cancer Mother   . Hypertension Mother   . Other Father        gunshot wound  . Early death Father        GSW  . Cancer Brother        Stomach Cancer  . Cancer Brother        Lung Cancer  . Stroke Maternal Aunt   . Alcohol abuse Maternal Uncle   . Stroke  Maternal Grandfather   . 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 Smoking status: Former Smoker    Packs/day: 0.50    Years: 29.00    Pack years: 14.50    Types: Cigarettes    Quit date: 2006    Years since quitting: 15.8  . Smokeless tobacco: Never Used  Substance Use Topics  . Alcohol use: Not Currently    Comment: quit 2006    Current Meds  Medication Sig  . aspirin EC 81 MG tablet Take 1 tablet (81 mg total) by mouth daily.  [name] atorvastatin (LIPITOR) 80 MG  tablet TAKE 1 TABLET BY MOUTH EVERY DAY  . carvedilol (COREG) 3.125 MG tablet TAKE 1 TABLET(3.125 MG) BY MOUTH TWICE DAILY WITH A MEAL  . Cholecalciferol (VITAMIN D3) 250 MCG (10000 UT) capsule Take 10,000 Units by mouth daily.  . clopidogrel (PLAVIX) 75 MG tablet TAKE 1 TABLET BY MOUTH DAILY  . lisinopril (ZESTRIL) 20 MG tablet TAKE 1 TABLET BY MOUTH DAILY (Patient taking differently: Take 20 mg by mouth 2 (two) times daily. )      Depression screen [facility] 2/9 07/11/2019 01/26/2019 04/16/2017 10/14/2016 07/08/2016  Decreased Interest 0 0 0 0 0  Down, Depressed, Hopeless 0 0 0 0 0  PHQ - 2 Score 0 0 0 0 0     Objective:   Today's Vitals: BP 130/80   Pulse 62   Temp (!) 96.9 F (36.1 C) (Temporal)   Ht 6\' 2"  (1.88 m)   Wt 205 lb 12.8 oz (93.4 kg)   SpO2 98%   BMI 26.42 kg/m  Vitals with BMI 12/13/2019 11/24/2019 11/02/2019  Height 6\' 2"  6\' 2"  6\' 2"   Weight 205 lbs 13 oz 205 lbs 10 oz 204 lbs 10 oz  BMI 26.41 19.14 78.29  Systolic 562 130 865  Diastolic 80 80 70  Pulse 62 64 63     Physical Exam  He looks systemically well.  Blood pressure is much improved and this is consistent with his home readings also.     Assessment   1. Essential hypertension       Tests ordered Orders Placed This Encounter  Procedures  . COMPLETE METABOLIC PANEL WITH GFR     Plan: 1. We will check electrolytes but he will continue with the present dose of lisinopril. 2. We also discussed the importance of COVID-19 vaccination and I have urged him to get vaccinated. 3. Follow-up as scheduled in December.   No orders of the defined types were placed in this encounter.   [name], MD

## 2019-12-14 ENCOUNTER — Ambulatory Visit: Payer: Medicare HMO | Admitting: Podiatry

## 2019-12-14 ENCOUNTER — Encounter: Payer: Self-pay | Admitting: Podiatry

## 2019-12-14 DIAGNOSIS — M79675 Pain in left toe(s): Secondary | ICD-10-CM

## 2019-12-14 DIAGNOSIS — B351 Tinea unguium: Secondary | ICD-10-CM | POA: Diagnosis not present

## 2019-12-14 DIAGNOSIS — E119 Type 2 diabetes mellitus without complications: Secondary | ICD-10-CM

## 2019-12-14 DIAGNOSIS — M79674 Pain in right toe(s): Secondary | ICD-10-CM

## 2019-12-14 LAB — COMPLETE METABOLIC PANEL WITH GFR
AG Ratio: 1.5 (calc) (ref 1.0–2.5)
ALT: 30 U/L (ref 9–46)
AST: 31 U/L (ref 10–35)
Albumin: 4.4 g/dL (ref 3.6–5.1)
Alkaline phosphatase (APISO): 78 U/L (ref 35–144)
BUN: 20 mg/dL (ref 7–25)
CO2: 29 mmol/L (ref 20–32)
Calcium: 10 mg/dL (ref 8.6–10.3)
Chloride: 99 mmol/L (ref 98–110)
Creat: 1.08 mg/dL (ref 0.70–1.18)
GFR, Est African American: 80 mL/min/{1.73_m2} (ref >=60)
GFR, Est Non African American: 69 mL/min/{1.73_m2} (ref >=60)
Globulin: 3 g/dL (calc) (ref 1.9–3.7)
Glucose, Bld: 91 mg/dL (ref 65–99)
Potassium: 4.8 mmol/L (ref 3.5–5.3)
Sodium: 136 mmol/L (ref 135–146)
Total Bilirubin: 0.5 mg/dL (ref 0.2–1.2)
Total Protein: 7.4 g/dL (ref 6.1–8.1)

## 2019-12-14 NOTE — Progress Notes (Signed)
This patient returns to my office for at risk foot care.  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  Patient is taking plavix.  This patient is unable to cut nails himself since the patient cannot reach his nails.These nails are painful walking and wearing shoes.  This patient presents for at risk foot care today.  General Appearance  Alert, conversant and in no acute stress.  Vascular  Dorsalis pedis and posterior tibial  pulses are weakly  palpable  bilaterally.  Capillary return is within normal limits  bilaterally. Temperature is within normal limits  bilaterally.  Neurologic  Senn-Weinstein monofilament wire test within normal limits/diminished   bilaterally. Muscle power within normal limits bilaterally.  Nails Thick disfigured discolored nails with subungual debris  from hallux to fifth toes bilaterally. No evidence of bacterial infection or drainage bilaterally.  Orthopedic  No limitations of motion  feet .  No crepitus or effusions noted.  No bony pathology or digital deformities noted. HAV  B/L.  Skin  normotropic skin with no porokeratosis noted bilaterally.  No signs of infections or ulcers noted.     Onychomycosis  Pain in right toes  Pain in left toes  Consent was obtained for treatment procedures.   Mechanical debridement of nails 1-5  bilaterally performed with a nail nipper.  Filed with dremel without incident.    Return office visit   3 months                  Told patient to return for periodic foot care and evaluation due to potential at risk complications.   Amyriah Buras DPM  

## 2020-01-15 ENCOUNTER — Other Ambulatory Visit (INDEPENDENT_AMBULATORY_CARE_PROVIDER_SITE_OTHER): Payer: Self-pay | Admitting: Nurse Practitioner

## 2020-02-02 ENCOUNTER — Ambulatory Visit (INDEPENDENT_AMBULATORY_CARE_PROVIDER_SITE_OTHER): Payer: Medicare HMO | Admitting: Internal Medicine

## 2020-02-02 ENCOUNTER — Encounter (INDEPENDENT_AMBULATORY_CARE_PROVIDER_SITE_OTHER): Payer: Self-pay | Admitting: Internal Medicine

## 2020-02-02 ENCOUNTER — Other Ambulatory Visit: Payer: Self-pay

## 2020-02-02 VITALS — BP 158/104 | HR 65 | Temp 97.5°F | Ht 74.0 in | Wt 207.0 lb

## 2020-02-02 DIAGNOSIS — E1169 Type 2 diabetes mellitus with other specified complication: Secondary | ICD-10-CM | POA: Diagnosis not present

## 2020-02-02 DIAGNOSIS — E785 Hyperlipidemia, unspecified: Secondary | ICD-10-CM

## 2020-02-02 DIAGNOSIS — I10 Essential (primary) hypertension: Secondary | ICD-10-CM | POA: Diagnosis not present

## 2020-02-02 MED ORDER — LISINOPRIL 20 MG PO TABS: 20.0000 mg | ORAL_TABLET | Freq: Two times a day (BID) | ORAL | 1 refills | Status: DC

## 2020-02-02 MED ORDER — HYDROCHLOROTHIAZIDE 25 MG PO TABS: 25.0000 mg | ORAL_TABLET | Freq: Every day | ORAL | 3 refills | Status: DC

## 2020-02-02 NOTE — Progress Notes (Signed)
Metrics: Intervention Frequency ACO  Documented Smoking Status Yearly  Screened one or more times in 24 months  Cessation Counseling or  Active cessation medication Past 24 months  Past 24 months   Guideline developer: UpToDate (See UpToDate for funding source) Date Released: 2014       Wellness Office Visit  Subjective:  Patient ID: Albert Morris, male    DOB: 04-28-1950  Age: 69 y.o. MRN: [id number]  CC: This man comes in for follow-up of hypertension, dyslipidemia and diabetes. HPI On the last visit, his blood pressure seem to be controlled but more recently he tells me his blood pressure readings have been elevated.  He is not sure why.  He has had a previous history of stroke. He is not on any medications for diabetes. He continues on statin therapy in the face of coronary artery disease and cerebrovascular disease.    Past Medical History:  Diagnosis Date  . Colon polyps   . CVA (cerebral vascular accident) ([place]) 06/21/2016  . Diabetes mellitus without complication ([place]) 08/10/7626  . Hyperlipidemia   . Hypertension   . Myocardial infarction ([place])   . Pneumonia   . Pre-diabetes   . Substance abuse [facility])    Past Surgical History:  Procedure Laterality Date  . CAROTID ENDARTERECTOMY Left   . COLON SURGERY  [phone]   growth removed, Laparoscopic Vs. open Left hemicolectomy Dr. Winter 09-25-17  . COLONOSCOPY N/A 06/26/2017   Procedure: COLONOSCOPY;  Surgeon: [name], MD;  [facility];  Service: Endoscopy;  Laterality: N/A;  730  . COLONOSCOPY N/A 12/01/2018   Procedure: COLONOSCOPY;  Surgeon: [name], MD;  [facility];  Service: Endoscopy;  Laterality: N/A;  930  . COLONOSCOPY WITH PROPOFOL N/A 10/07/2017   Procedure: COLONOSCOPY WITH PROPOFOL WITH TATTOO;  Surgeon: [name], MD;  Location: [facility];  Service: General;  Laterality: N/A;  . ENDARTERECTOMY Left 08/01/2016   Procedure: ENDARTERECTOMY LEFT CAROTID;   Surgeon: [name], MD;  Location: [place];  Service: Vascular;  Laterality: Left;  . FLEXIBLE SIGMOIDOSCOPY N/A 10/08/2017   Procedure: FLEXIBLE SIGMOIDOSCOPY;  Surgeon: [name], MD;  [facility];  Service: General;  Laterality: N/A;  . LAPAROSCOPIC RIGHT HEMI COLECTOMY Left 10/08/2017   Procedure: LAPAROSCOPIC VERSES OPEN LEFT HEMI COLECTOMY ERAS PATHWAY;  Surgeon: [name], MD;  [facility];  Service: General;  Laterality: Left;  . PATCH ANGIOPLASTY Left 08/01/2016   Procedure: PATCH ANGIOPLASTY LEFT CAROTID ARTERY USING ZENOSURE BIOLOGIC PATCH;  Surgeon: [name], MD;  Location: [place];  Service: Vascular;  Laterality: Left;  . POLYPECTOMY  12/01/2018   Procedure: POLYPECTOMY;  Surgeon: [name], MD;  [facility];  Service: Endoscopy;;  . SUBMUCOSAL INJECTION  10/07/2017   Procedure: SUBMUCOSAL INJECTION;  Surgeon: [name], MD;  [facility];  Service: General;;  . TONSILLECTOMY       Family History  Problem Relation Age of Onset  . Breast cancer Mother        lived to be 15  . CVA Mother   . CAD Mother   . Arthritis Mother   . Cancer Mother   . Hypertension Mother   . Other Father        gunshot wound  . Early death Father        GSW  . Cancer Brother        Stomach Cancer  . Cancer  Brother        Lung Cancer  . Stroke Maternal Aunt   . Alcohol abuse Maternal Uncle   . Stroke Maternal Grandfather   . 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 Smoking status: Former Smoker    Packs/day: 0.50    Years: 29.00    Pack years: 14.50    Types: Cigarettes    Quit date: 2006    Years since quitting: 15.9  . Smokeless tobacco: Never Used  Substance Use Topics  . Alcohol use: Not  Currently    Comment: quit 2006    Current Meds  Medication Sig  . aspirin EC 81 MG tablet Take 1 tablet (81 mg total) by mouth daily.  [name] atorvastatin (LIPITOR) 80 MG tablet TAKE 1 TABLET BY MOUTH EVERY DAY  . carvedilol (COREG) 3.125 MG tablet TAKE 1 TABLET(3.125 MG) BY MOUTH TWICE DAILY WITH A MEAL  . Cholecalciferol (VITAMIN D3) 250 MCG (10000 UT) capsule Take 10,000 Units by mouth daily.  . clopidogrel (PLAVIX) 75 MG tablet TAKE 1 TABLET BY MOUTH DAILY  . [DISCONTINUED] lisinopril (ZESTRIL) 20 MG tablet TAKE 1 TABLET BY MOUTH DAILY (Patient taking differently: Take 20 mg by mouth 2 (two) times daily.)      Depression screen [facility] 2/9 02/02/2020 07/11/2019 01/26/2019 04/16/2017 10/14/2016  Decreased Interest 0 0 0 0 0  Down, Depressed, Hopeless 0 0 0 0 0  PHQ - 2 Score 0 0 0 0 0  Altered sleeping 0 - - - -  Tired, decreased energy 0 - - - -  Change in appetite 0 - - - -  Feeling bad or failure about yourself  0 - - - -  Trouble concentrating 0 - - - -  Moving slowly or fidgety/restless 0 - - - -  Suicidal thoughts 0 - - - -  PHQ-9 Score 0 - - - -  Difficult doing work/chores Not difficult at all - - - -     Objective:   Today's Vitals: BP (!) 158/104   Pulse 65   Temp (!) 97.5 F (36.4 C) (Temporal)   Ht 6\' 2"  (1.88 m)   Wt 207 lb (93.9 kg)   SpO2 97%   BMI 26.58 kg/m  Vitals with BMI 02/02/2020 12/13/2019 11/24/2019  Height 6\' 2"  6\' 2"  6\' 2"   Weight 207 lbs 205 lbs 13 oz 205 lbs 10 oz  BMI 26.57 41.32 44.01  Systolic 027 253 664  Diastolic 403 80 80  Pulse 65 62 64     Physical Exam   He looks systemically well.  Blood pressure is uncontrolled.  He is alert and orientated without any focal neurological signs.    Assessment   1. 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 Essential hypertension   3. Dyslipidemia       Tests ordered Orders Placed This Encounter  Procedures  . COMPLETE METABOLIC PANEL WITH GFR  . Hemoglobin A1c  . Lipid panel      Plan: 1. I am going to add hydrochlorothiazide to his lisinopril for his hypertension and hopefully this combination will help him improve blood pressure control. 2. We will check  an A1c for his diabetes. 3. He will continue for the time being with statin therapy and we will check a lipid panel. 4. Follow-up with Judson Roch in about 6 weeks to see how he is doing and he will need blood work, especially electrolytes checked then.   Meds ordered this encounter  Medications  . hydrochlorothiazide (HYDRODIURIL) 25 MG tablet    Sig: Take 1 tablet (25 mg total) by mouth daily.    Dispense:  30 tablet    Refill:  3  . lisinopril (ZESTRIL) 20 MG tablet    Sig: Take 1 tablet (20 mg total) by mouth 2 (two) times daily.    Dispense:  180 tablet    Refill:  1    Vinette Crites Luther Parody, MD

## 2020-02-03 LAB — LIPID PANEL
Cholesterol: 153 mg/dL (ref <200)
HDL: 44 mg/dL (ref >=40)
LDL Cholesterol (Calc): 90 mg/dL (calc)
Non-HDL Cholesterol (Calc): 109 mg/dL (calc) (ref <130)
Total CHOL/HDL Ratio: 3.5 (calc) (ref <5.0)
Triglycerides: 92 mg/dL (ref <150)

## 2020-02-03 LAB — COMPLETE METABOLIC PANEL WITH GFR
AG Ratio: 1.6 (calc) (ref 1.0–2.5)
ALT: 29 U/L (ref 9–46)
AST: 28 U/L (ref 10–35)
Albumin: 4.4 g/dL (ref 3.6–5.1)
Alkaline phosphatase (APISO): 76 U/L (ref 35–144)
BUN/Creatinine Ratio: 19 (calc) (ref 6–22)
BUN: 23 mg/dL (ref 7–25)
CO2: 30 mmol/L (ref 20–32)
Calcium: 10 mg/dL (ref 8.6–10.3)
Chloride: 99 mmol/L (ref 98–110)
Creat: 1.22 mg/dL — ABNORMAL HIGH (ref 0.70–1.18)
GFR, Est African American: 69 mL/min/{1.73_m2} (ref >=60)
GFR, Est Non African American: 60 mL/min/{1.73_m2} (ref >=60)
Globulin: 2.7 g/dL (calc) (ref 1.9–3.7)
Glucose, Bld: 123 mg/dL (ref 65–139)
Potassium: 5.6 mmol/L — ABNORMAL HIGH (ref 3.5–5.3)
Sodium: 135 mmol/L (ref 135–146)
Total Bilirubin: 0.5 mg/dL (ref 0.2–1.2)
Total Protein: 7.1 g/dL (ref 6.1–8.1)

## 2020-02-03 LAB — HEMOGLOBIN A1C
Hgb A1c MFr Bld: 6.4 % of total Hgb — ABNORMAL HIGH (ref <5.7)
Mean Plasma Glucose: 137 mg/dL
eAG (mmol/L): 7.6 mmol/L

## 2020-02-07 ENCOUNTER — Encounter (INDEPENDENT_AMBULATORY_CARE_PROVIDER_SITE_OTHER): Payer: Self-pay | Admitting: Nurse Practitioner

## 2020-02-07 ENCOUNTER — Other Ambulatory Visit: Payer: Self-pay

## 2020-02-07 ENCOUNTER — Ambulatory Visit (INDEPENDENT_AMBULATORY_CARE_PROVIDER_SITE_OTHER): Payer: Medicare HMO | Admitting: Nurse Practitioner

## 2020-02-07 VITALS — BP 142/80 | HR 72 | Temp 96.9°F | Ht 73.0 in | Wt 208.6 lb

## 2020-02-07 DIAGNOSIS — E875 Hyperkalemia: Secondary | ICD-10-CM

## 2020-02-07 DIAGNOSIS — Z0000 Encounter for general adult medical examination without abnormal findings: Secondary | ICD-10-CM

## 2020-02-07 DIAGNOSIS — R5381 Other malaise: Secondary | ICD-10-CM | POA: Diagnosis not present

## 2020-02-07 NOTE — Patient Instructions (Addendum)
BRING YOUR AT-HOME BLOOD PRESSURE CUFF TO NEXT OFFICE VISIT  [facility] the [facility]: Covid 19 Vaccines Flu shot PPSV23 (pneumonia shot) Tetanus Shot Shingles Vaccine   Albert Morris , Thank you for taking time to come for your Medicare Wellness Visit. I appreciate your ongoing commitment to your health goals. Please review the following plan we discussed and let me know if I can assist you in the future.   These are the goals we discussed: Goals    . Blood Pressure < 140/90    . Exercise 150 minutes per week (walking)       This is a list of the screening recommended for you and due dates:  Health Maintenance  Topic Date Due  . COVID-19 Vaccine (1) Never done  . Tetanus Vaccine  Never done  . Flu Shot  05/17/2020*  . Pneumonia vaccines (1 of 2 - PCV13) 07/10/2020*  . Eye exam for diabetics  02/24/2020  . Hemoglobin A1C  08/02/2020  . Complete foot exam   11/01/2020  . Urine Protein Check  11/01/2020  . Colon Cancer Screening  12/01/2023  .  Hepatitis C: One time screening is recommended by Center for Disease Control  (CDC) for  adults born from [birth year] through 1966.   Completed  *Topic was postponed. The date shown is not the original due date.

## 2020-02-07 NOTE — Progress Notes (Signed)
Subjective:   Albert Morris is a 69 y.o. male who presents for Medicare Annual/Subsequent preventive examination.  He is also concerned regarding his blood pressure and he is worried he may have an undiagnosed infection.  He tells me that when he is at home he checks his blood pressure regularly on an at home automatic cuff that he received from the [place] approximately 2 months ago.  He tells me that normally his blood pressure is [id number] systolically and [id number] diastolically.  He has noticed that after he takes his lisinopril when he rechecks his blood pressure in 1 hour his blood pressure will rise to 180-190/100s.  He tells me he did stop taking his lisinopril 1 day last week to see how his blood pressure would react.  Has not he checked his blood pressure 5 times throughout that day and it never went above [id number] systolically.  He is wondering if he should stop the lisinopril.  Of note, he did have blood work collected last week which did show some mild hyperkalemia.  He is on hydrochlorothiazide as well as Coreg.  He tells me he is concerned he may have an infection and that his labile blood pressures are a sign of infection.  He would like to be checked for this.  He denies any other infectious symptoms.  He is requesting to have his urine checked as well as have a CBC checked.  Review of Systems     Cardiac Risk Factors include: advanced age (>27men, >56 women);diabetes mellitus;dyslipidemia;hypertension;male gender     Objective:    Today's Vitals   02/07/20 1010  BP: (!) 142/80  Pulse: 72  Temp: (!) 96.9 F (36.1 C)  TempSrc: Temporal  SpO2: 97%  Weight: 208 lb 9.6 oz (94.6 kg)  Height: 6\' 1"  (1.854 m)   Body mass index is 27.52 kg/m.  Advanced Directives 02/07/2020 12/01/2018 10/08/2017 10/07/2017 09/18/2017 06/26/2017 08/01/2016  Does Patient Have a Medical Advance Directive? Yes No No - No No No  Type of Advance Directive [place]  Does patient  want to make changes to medical advance directive? No - Patient declined - - - - - -  Copy of [place] in Chart? No - copy requested - - - - - -  Would patient like information on creating a medical advance directive? - No - Patient declined No - Patient declined No - Patient declined Yes (MAU/Ambulatory/Procedural Areas - Information given) Yes (MAU/Ambulatory/Procedural Areas - Information given) No - Patient declined    Current Medications (verified) Outpatient Encounter Medications as of 02/07/2020  Medication Sig  . aspirin EC 81 MG tablet Take 1 tablet (81 mg total) by mouth daily.  [name] atorvastatin (LIPITOR) 80 MG tablet TAKE 1 TABLET BY MOUTH EVERY DAY  . carvedilol (COREG) 3.125 MG tablet TAKE 1 TABLET(3.125 MG) BY MOUTH TWICE DAILY WITH A MEAL  . Cholecalciferol (VITAMIN D3) 250 MCG (10000 UT) capsule Take 10,000 Units by mouth daily.  . clopidogrel (PLAVIX) 75 MG tablet TAKE 1 TABLET BY MOUTH DAILY  . hydrochlorothiazide (HYDRODIURIL) 25 MG tablet Take 1 tablet (25 mg total) by mouth daily.  . [DISCONTINUED] lisinopril (ZESTRIL) 20 MG tablet Take 1 tablet (20 mg total) by mouth 2 (two) times daily.  . [DISCONTINUED] atorvastatin (LIPITOR) 80 MG tablet TAKE 1 TABLET BY MOUTH EVERY DAY (Patient taking differently: Take 80 mg by mouth daily at 6 PM. )  . [DISCONTINUED] atorvastatin (  LIPITOR) 80 MG tablet TAKE 1 TABLET BY MOUTH EVERY DAY  . [DISCONTINUED] atorvastatin (LIPITOR) 80 MG tablet TAKE 1 TABLET BY MOUTH EVERY DAY  . [DISCONTINUED] carvedilol (COREG) 3.125 MG tablet Take 1 tablet (3.125 mg total) by mouth 2 (two) times daily with a meal.  . [DISCONTINUED] carvedilol (COREG) 3.125 MG tablet TAKE 1 TABLET(3.125 MG) BY MOUTH TWICE DAILY WITH A MEAL  . [DISCONTINUED] carvedilol (COREG) 3.125 MG tablet TAKE 1 TABLET(3.125 MG) BY MOUTH TWICE DAILY WITH A MEAL  . [DISCONTINUED] clopidogrel (PLAVIX) 75 MG tablet Take 75 mg by mouth daily.  . [DISCONTINUED] clopidogrel  (PLAVIX) 75 MG tablet TAKE 1 TABLET BY MOUTH DAILY  . [DISCONTINUED] clopidogrel (PLAVIX) 75 MG tablet TAKE 1 TABLET BY MOUTH DAILY   No facility-administered encounter medications on file as of 02/07/2020.    Allergies (verified) Blood-group specific substance   History: Past Medical History:  Diagnosis Date  . Colon polyps   . CVA (cerebral vascular accident) ([place]) 06/21/2016  . Diabetes mellitus without complication ([place]) [id number]  . Hyperlipidemia   . Hypertension   . Myocardial infarction ([place])   . Pneumonia   . Pre-diabetes   . Substance abuse [facility])    Past Surgical History:  Procedure Laterality Date  . CAROTID ENDARTERECTOMY Left   . COLON SURGERY  [phone]   growth removed, Laparoscopic Vs. open Left hemicolectomy Dr. Winter 09-25-17  . COLONOSCOPY N/A 06/26/2017   Procedure: COLONOSCOPY;  Surgeon: [name], MD;  [facility];  Service: Endoscopy;  Laterality: N/A;  730  . COLONOSCOPY N/A 12/01/2018   Procedure: COLONOSCOPY;  Surgeon: [name], MD;  [facility];  Service: Endoscopy;  Laterality: N/A;  930  . COLONOSCOPY WITH PROPOFOL N/A 10/07/2017   Procedure: COLONOSCOPY WITH PROPOFOL WITH TATTOO;  Surgeon: [name], MD;  [facility];  Service: General;  Laterality: N/A;  . ENDARTERECTOMY Left 08/01/2016   Procedure: ENDARTERECTOMY LEFT CAROTID;  Surgeon: [name], MD;  Location: [place];  Service: Vascular;  Laterality: Left;  . FLEXIBLE SIGMOIDOSCOPY N/A 10/08/2017   Procedure: FLEXIBLE SIGMOIDOSCOPY;  Surgeon: [name], MD;  [facility];  Service: General;  Laterality: N/A;  . LAPAROSCOPIC RIGHT HEMI COLECTOMY Left 10/08/2017   Procedure: LAPAROSCOPIC VERSES OPEN LEFT HEMI COLECTOMY ERAS PATHWAY;  Surgeon: [name], MD;  [facility];  Service: General;  Laterality: Left;  . PATCH ANGIOPLASTY Left 08/01/2016   Procedure: PATCH ANGIOPLASTY LEFT CAROTID ARTERY USING  ZENOSURE BIOLOGIC PATCH;  Surgeon: [name], MD;  Location: [place];  Service: Vascular;  Laterality: Left;  . POLYPECTOMY  12/01/2018   Procedure: POLYPECTOMY;  Surgeon: [name], MD;  [facility];  Service: Endoscopy;;  . SUBMUCOSAL INJECTION  10/07/2017   Procedure: SUBMUCOSAL INJECTION;  Surgeon: [name], MD;  Location: [facility];  Service: General;;  . TONSILLECTOMY     Family History  Problem Relation Age of Onset  . Breast cancer Mother        lived to be 63  . CVA Mother   . CAD Mother   . Arthritis Mother   . Cancer Mother   . Hypertension Mother   . Other Father        gunshot wound  . Early death Father        GSW  . Cancer Brother        Stomach Cancer  . Cancer Brother  Lung Cancer  . Stroke Maternal Aunt   . Alcohol abuse Maternal Uncle   . Stroke Maternal Grandfather   . Colon cancer Neg Hx    Social History   Socioeconomic History  . Marital status: Divorced    Spouse name: Not on file  . Number of children: 2  . Years of education: 16  . Highest education level: Not on file  Occupational History  . Occupation: disabled    Comment: alcoholic  Tobacco Use  . Smoking status: Former Smoker    Packs/day: 0.50    Years: 29.00    Pack years: 14.50    Types: Cigarettes    Quit date: 2006    Years since quitting: 15.9  . Smokeless tobacco: Never Used  Vaping Use  . Vaping Use: Never used  Substance and Sexual Activity  . Alcohol use: Not Currently    Comment: quit 2006  . Drug use: Not Currently    Comment: remote h/o heavy marijuana use, also used cocaine and others  . Sexual activity: Not Currently  Other Topics Concern  . 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, EMT certified      Lives alone   Likes to walk      2 sons -in [place] and [place]   Social Determinants of Health   Financial Resource Strain: Not on file  Food Insecurity: Not on  file  Transportation Needs: Not on file  Physical Activity: Not on file  Stress: Not on file  Social Connections: Not on file    Tobacco Counseling Counseling given: Yes   Clinical Intake:  Pre-visit preparation completed: Yes  Pain : No/denies pain     BMI - recorded: 27.52 Nutritional Status: BMI 25 -29 Overweight Nutritional Risks: None Diabetes: Yes CBG done?: No CBG resulted in Enter/ Edit results?: No Did pt. bring in CBG monitor from home?: No  How often do you need to have someone help you when you read instructions, pamphlets, or other written materials from your doctor or pharmacy?: 1 - Never What is the last grade level you completed in school?: 2 years of college  Diabetic? Yes  Interpreter Needed?: No  Information entered by :: Jeralyn Ruths, NP-C   Activities of Daily Living In your present state of health, do you have any difficulty performing the following activities: 02/07/2020  Hearing? N  Vision? N  Difficulty concentrating or making decisions? N  Walking or climbing stairs? N  Dressing or bathing? N  Doing errands, shopping? N  Preparing Food and eating ? N  Using the Toilet? N  In the past six months, have you accidently leaked urine? N  Do you have problems with loss of bowel control? N  Managing your Medications? N  Managing your Finances? N  Housekeeping or managing your Housekeeping? N  Some recent data might be hidden    Patient Care Team: [name], MD as PCP - General (Internal Medicine) Herminio Commons, MD (Inactive) as PCP - Cardiology (Cardiology)  Indicate any recent Medical Services you may have received from other than [facility] providers in the past year (date may be approximate).  Physical exam Physical Exam Vitals reviewed.  Constitutional:      Appearance: Normal appearance.  HENT:     Head: Normocephalic and atraumatic.  Carotid Bruit: Negative Cardiovascular:     Rate and Rhythm: Normal rate and regular  rhythm.  Pulmonary:  Effort: Pulmonary effort is normal.     Breath sounds: Normal breath sounds.  Skin:    General: Skin is warm and dry.  Neurological:     Mental Status: He is alert and oriented to person, place, and time.  Psychiatric:        Mood and Affect: Mood normal.        Behavior: Behavior normal.        Thought Content: Thought content normal.        Judgment: Judgment normal.       Assessment:   This is a routine wellness examination for [place].  Hearing/Vision screen No exam data present  Dietary issues and exercise activities discussed: Current Exercise Habits: Home exercise routine, Type of exercise: walking, Time (Minutes): 25, Frequency (Times/Week): 7, Weekly Exercise (Minutes/Week): 175, Intensity: Moderate, Exercise limited by: None identified  Goals    . Blood Pressure < 140/90    . Exercise 150 minutes per week (walking)      Depression Screen PHQ 2/9 Scores 02/02/2020 07/11/2019 01/26/2019 04/16/2017 10/14/2016 07/08/2016  PHQ - 2 Score 0 0 0 0 0 0  PHQ- 9 Score 0 - - - - -  Exception Documentation - Medical reason - - - -    Fall Risk Fall Risk  02/07/2020 07/11/2019 04/16/2017 10/14/2016 07/08/2016  Falls in the past year? 0 0 No No No  Number falls in past yr: - 0 - - -  Injury with Fall? - 0 - - -  Risk for fall due to : - No Fall Risks - - -  Follow up - Falls evaluation completed - - -    FALL RISK PREVENTION PERTAINING TO THE HOME:  Any stairs in or around the home? Yes  If so, are there any without handrails? No  Home free of loose throw rugs in walkways, pet beds, electrical cords, etc? Yes  Adequate lighting in your home to reduce risk of falls? Yes   ASSISTIVE DEVICES UTILIZED TO PREVENT FALLS:  Life alert? No  Use of a cane, walker or w/c? Yes  - Cane as needed Grab bars in the bathroom? No  Shower chair or bench in shower? Yes  Elevated toilet seat or a handicapped toilet? No   TIMED UP AND GO:  Was the test performed? Yes  .  Length of time to ambulate 10 feet: 9 sec.   Gait steady and fast without use of assistive device  Cognitive Function:     6CIT Screen 02/07/2020  What Year? 0 points  What month? 0 points  What time? 0 points  Count back from 20 0 points  Months in reverse 0 points  Repeat phrase 0 points  Total Score 0    Immunizations There is no immunization history for the selected administration types on file for this patient.  TDAP status: Due, Education has been provided regarding the importance of this vaccine. Advised may receive this vaccine at local pharmacy or Health Dept. Aware to provide a copy of the vaccination record if obtained from local pharmacy or Health Dept. Verbalized acceptance and understanding. -[facility] with [facility]  Flu Vaccine status: Declined, Education has been provided regarding the importance of this vaccine but patient still declined. Advised may receive this vaccine at local pharmacy or Health Dept. Aware to provide a copy of the vaccination record if obtained from local pharmacy or Health Dept. Verbalized acceptance and understanding. - Patient will consider after taking the covid shots which he plans  on doing 02/21/19  Pneumococcal vaccine status: Declined,  Education has been provided regarding the importance of this vaccine but patient still declined. Advised may receive this vaccine at local pharmacy or Health Dept. Aware to provide a copy of the vaccination record if obtained from local pharmacy or Health Dept. Verbalized acceptance and understanding.  - [facility] discuss with [facility]  Covid-19 vaccine status: Declined, Education has been provided regarding the importance of this vaccine but patient still declined. Advised may receive this vaccine at local pharmacy or Health Dept.or vaccine clinic. Aware to provide a copy of the vaccination record if obtained from local pharmacy or Health Dept. Verbalized acceptance and understanding. - patient plans on starting the  vaccines on 02/21/19  Qualifies for Shingles Vaccine? Yes  - Discuss with [facility] Zostavax completed No   Shingrix Completed?: No.    Education has been provided regarding the importance of this vaccine. Patient has been advised to call insurance company to determine out of pocket expense if they have not yet received this vaccine. Advised may also receive vaccine at local pharmacy or Health Dept. Verbalized acceptance and understanding.  Screening Tests Health Maintenance  Topic Date Due  . COVID-19 Vaccine (1) Never done  . TETANUS/TDAP  Never done  . INFLUENZA VACCINE  05/17/2020 (Originally 09/18/2019)  . PNA vac Low Risk Adult (1 of 2 - PCV13) 07/10/2020 (Originally 09/15/2014)  . OPHTHALMOLOGY EXAM  02/24/2020  . HEMOGLOBIN A1C  08/02/2020  . FOOT EXAM  11/01/2020  . URINE MICROALBUMIN  11/01/2020  . COLONOSCOPY  12/01/2023  . Hepatitis C Screening  Completed    Health Maintenance  Health Maintenance Due  Topic Date Due  . COVID-19 Vaccine (1) Never done  . TETANUS/TDAP  Never done    Colorectal cancer screening: Type of screening: Colonoscopy. Completed 2020. Repeat every 5 years  Lung Cancer Screening: (Low Dose CT Chest recommended if Age 51-80 years, 30 pack-year currently smoking OR have quit w/in 15years.) does not qualify.   Lung Cancer Screening Referral: NA  Additional Screening:  Hepatitis C Screening: does not qualify; Completed 2018  Vision Screening: Recommended annual ophthalmology exams for early detection of glaucoma and other disorders of the eye. Is the patient up to date with their annual eye exam?  Yes  Who is the provider or what is the name of the office in which the patient attends annual eye exams? [facility] If pt is not established with a provider, would they like to be referred to a provider to establish care? No .   Dental Screening: Recommended annual dental exams for proper oral hygiene  Community Resource Referral / Chronic Care Management: CRR  required this visit?  No   CCM required this visit?  No      Plan:     I have personally reviewed and noted the following in the patient's chart:   . Medical and social history . Use of alcohol, tobacco or illicit drugs  . Current medications and supplements . Functional ability and status . Nutritional status . Physical activity . Advanced directives . List of other physicians . Hospitalizations, surgeries, and ER visits in previous 12 months . Vitals . Screenings to include cognitive, depression, and falls . Referrals and appointments  In addition, I have reviewed and discussed with patient certain preventive protocols, quality metrics, and best practice recommendations. A written personalized care plan for preventive services as well as general preventive health recommendations were provided to patient.   Per shared  decision making the patient would really like to trial himself off of lisinopril.  I am agreeable to this but have encouraged him to continue taking his hydrochlorothiazide as well as Coreg.  He was told to check his blood pressure daily at home and keep a log of this and bring this to his next office appointment as well as to bring his blood pressure cuff to his next office appointment so we can verify that it is accurate.  He was told if he experiences any symptoms of being unwell especially if he experiences headache, blurry vision, dizziness, chest pressure or pain, or shortness of breath that he needs to restart the lisinopril and consider calling 911 to proceed to the emergency department the symptoms come on acutely.  He tells me he understands.  I have low suspicion for infection at this time, however but because the patient is concerned we will check blood work including CBC with differential and urinalysis with reflex to culture.  We will also check CMP to further evaluate his hyperkalemia.  He is due for most vaccinations and is planning on going to the [place] early  next month to initiate COVID-19 vaccines.  He is due for flu shot I did tell him that CDC states that COVID-19 vaccination and flu vaccine to be given at the same time.  He still would like to hold off on getting the flu shot today but may consider this when he goes to the [place] in a couple of weeks.  He was encouraged to discuss tetanus, pneumonia, and shingles vaccines as well as COVID-19 and flu vaccines when he goes to the [place].  He was also told if he has any kind of penetrating wound or burn to let us know at which point we will administer tetanus shot.  He tells me he understands.  Ailene Ards, NP   02/07/2020   He will follow-up in 2 weeks for close monitoring of blood pressure and then again in 1 year for his annual wellness visit.

## 2020-02-08 LAB — CBC WITH DIFFERENTIAL/PLATELET
Absolute Monocytes: 644 cells/uL (ref 200–950)
Basophils Absolute: 51 cells/uL (ref 0–200)
Basophils Relative: 0.9 %
Eosinophils Absolute: 473 cells/uL (ref 15–500)
Eosinophils Relative: 8.3 %
HCT: 38.3 % — ABNORMAL LOW (ref 38.5–50.0)
Hemoglobin: 13.1 g/dL — ABNORMAL LOW (ref 13.2–17.1)
Lymphs Abs: 1613 cells/uL (ref 850–3900)
MCH: 29.8 pg (ref 27.0–33.0)
MCHC: 34.2 g/dL (ref 32.0–36.0)
MCV: 87.2 fL (ref 80.0–100.0)
MPV: 9.1 fL (ref 7.5–12.5)
Monocytes Relative: 11.3 %
Neutro Abs: 2918 cells/uL (ref 1500–7800)
Neutrophils Relative %: 51.2 %
Platelets: 340 10*3/uL (ref 140–400)
RBC: 4.39 10*6/uL (ref 4.20–5.80)
RDW: 12.9 % (ref 11.0–15.0)
Total Lymphocyte: 28.3 %
WBC: 5.7 10*3/uL (ref 3.8–10.8)

## 2020-02-08 LAB — URINALYSIS W MICROSCOPIC + REFLEX CULTURE
Bacteria, UA: NONE SEEN /HPF
Bilirubin Urine: NEGATIVE
Glucose, UA: NEGATIVE
Hgb urine dipstick: NEGATIVE
Hyaline Cast: NONE SEEN /LPF
Ketones, ur: NEGATIVE
Leukocyte Esterase: NEGATIVE
Nitrites, Initial: NEGATIVE
Protein, ur: NEGATIVE
RBC / HPF: NONE SEEN /HPF (ref 0–2)
Specific Gravity, Urine: 1.01 (ref 1.001–1.03)
Squamous Epithelial / HPF: NONE SEEN /HPF (ref <=5)
WBC, UA: NONE SEEN /HPF (ref 0–5)
pH: 6 (ref 5.0–8.0)

## 2020-02-08 LAB — COMPLETE METABOLIC PANEL WITH GFR
AG Ratio: 1.5 (calc) (ref 1.0–2.5)
ALT: 26 U/L (ref 9–46)
AST: 26 U/L (ref 10–35)
Albumin: 4.5 g/dL (ref 3.6–5.1)
Alkaline phosphatase (APISO): 77 U/L (ref 35–144)
BUN: 21 mg/dL (ref 7–25)
CO2: 30 mmol/L (ref 20–32)
Calcium: 10.1 mg/dL (ref 8.6–10.3)
Chloride: 100 mmol/L (ref 98–110)
Creat: 1.16 mg/dL (ref 0.70–1.18)
GFR, Est African American: 74 mL/min/{1.73_m2} (ref >=60)
GFR, Est Non African American: 63 mL/min/{1.73_m2} (ref >=60)
Globulin: 3 g/dL (calc) (ref 1.9–3.7)
Glucose, Bld: 98 mg/dL (ref 65–139)
Potassium: 5.7 mmol/L — ABNORMAL HIGH (ref 3.5–5.3)
Sodium: 136 mmol/L (ref 135–146)
Total Bilirubin: 0.5 mg/dL (ref 0.2–1.2)
Total Protein: 7.5 g/dL (ref 6.1–8.1)

## 2020-02-08 LAB — NO CULTURE INDICATED

## 2020-02-22 ENCOUNTER — Ambulatory Visit (INDEPENDENT_AMBULATORY_CARE_PROVIDER_SITE_OTHER): Payer: Medicare HMO | Admitting: Nurse Practitioner

## 2020-02-22 ENCOUNTER — Other Ambulatory Visit: Payer: Self-pay

## 2020-02-22 ENCOUNTER — Encounter (INDEPENDENT_AMBULATORY_CARE_PROVIDER_SITE_OTHER): Payer: Self-pay | Admitting: Nurse Practitioner

## 2020-02-22 VITALS — BP 132/78 | HR 68 | Temp 97.3°F | Ht 73.0 in | Wt 207.2 lb

## 2020-02-22 DIAGNOSIS — I10 Essential (primary) hypertension: Secondary | ICD-10-CM

## 2020-02-22 LAB — COMPLETE METABOLIC PANEL WITH GFR
AG Ratio: 1.4 (calc) (ref 1.0–2.5)
ALT: 30 U/L (ref 9–46)
AST: 31 U/L (ref 10–35)
Albumin: 4.4 g/dL (ref 3.6–5.1)
Alkaline phosphatase (APISO): 81 U/L (ref 35–144)
BUN/Creatinine Ratio: 18 (calc) (ref 6–22)
BUN: 22 mg/dL (ref 7–25)
CO2: 29 mmol/L (ref 20–32)
Calcium: 9.7 mg/dL (ref 8.6–10.3)
Chloride: 99 mmol/L (ref 98–110)
Creat: 1.21 mg/dL — ABNORMAL HIGH (ref 0.70–1.18)
GFR, Est African American: 70 mL/min/{1.73_m2} (ref >=60)
GFR, Est Non African American: 60 mL/min/{1.73_m2} (ref >=60)
Globulin: 3.2 g/dL (calc) (ref 1.9–3.7)
Glucose, Bld: 97 mg/dL (ref 65–139)
Potassium: 4.8 mmol/L (ref 3.5–5.3)
Sodium: 135 mmol/L (ref 135–146)
Total Bilirubin: 0.4 mg/dL (ref 0.2–1.2)
Total Protein: 7.6 g/dL (ref 6.1–8.1)

## 2020-02-22 NOTE — Progress Notes (Signed)
Subjective:  Patient ID: Albert Morris, male    DOB: 04-19-1950  Age: 70 y.o. MRN: [id number]  CC:  Chief Complaint  Patient presents with  . Follow-up    Doing okay, no concerns  . Hypertension  . hyperkalemia      HPI  This patient arrives today for the above.  At last office visit he discussed wanting to stop his lisinopril as he notices blood pressure seem to increase shortly after taking lisinopril.  He was also found to be mildly hyperkalemic.  We ended up stopping his lisinopril and he brings at home blood pressure readings with him today.  Majority of his readings are 130s/70s, he does have a few readings that are 150/80s.  He continues on Coreg and hydrochlorothiazide.  He tells me he has been checking his blood pressure at home using automatic cuff and sometimes wears long sleeves that he pulls up above the cuff before getting his blood pressure.  He denies any symptoms such as headache, difficulty breathing, fatigue, blurry vision, chest pain, or dizziness.    Past Medical History:  Diagnosis Date  . Colon polyps   . CVA (cerebral vascular accident) ([place]) 06/21/2016  . Diabetes mellitus without complication ([place]) 1/47/8295  . Hyperlipidemia   . Hypertension   . Myocardial infarction ([place])   . Pneumonia   . Pre-diabetes   . Substance abuse ([place])       Family History  Problem Relation Age of Onset  . Breast cancer Mother        lived to be 68  . CVA Mother   . CAD Mother   . Arthritis Mother   . Cancer Mother   . Hypertension Mother   . Other Father        gunshot wound  . Early death Father        GSW  . Cancer Brother        Stomach Cancer  . Cancer Brother        Lung Cancer  . Stroke Maternal Aunt   . Alcohol abuse Maternal Uncle   . Stroke Maternal Grandfather   . 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 Smoking status: Former Smoker    Packs/day: 0.50    Years: 29.00    Pack years: 14.50    Types: Cigarettes    Quit date: 2006    Years since quitting: 16.0  . Smokeless tobacco: Never Used  Substance Use Topics  . Alcohol use: Not Currently    Comment: quit 2006     Current Meds  Medication Sig  . aspirin EC 81 MG tablet Take 1 tablet (81 mg total) by mouth daily.  [name] atorvastatin (LIPITOR) 80 MG tablet TAKE 1 TABLET BY MOUTH EVERY DAY  . carvedilol (COREG) 3.125 MG tablet TAKE 1 TABLET(3.125 MG) BY MOUTH TWICE DAILY WITH A MEAL  . Cholecalciferol (VITAMIN D3) 250 MCG (10000 UT) capsule Take 10,000 Units by mouth daily.  . clopidogrel (PLAVIX) 75 MG tablet TAKE 1 TABLET BY MOUTH DAILY  . hydrochlorothiazide (HYDRODIURIL) 25 MG tablet Take 1 tablet (25 mg total) by mouth daily.    ROS:  See HPI  Objective:   Today's Vitals: BP 132/78   Pulse 68   Temp (!) 97.3 F (36.3 C) (Temporal)   Ht _0  (1.854 m)   Wt 207 lb 3.2 oz (94 kg)   SpO2 98%   BMI 27.34 kg/m  Vitals with BMI 02/22/2020 02/07/2020 02/02/2020  Height _1  _2  _3   Weight 207 lbs 3 oz 208 lbs 10 oz 207 lbs  BMI 27.34 78.46 96.29  Systolic 528 413 244  Diastolic 78 80 010  Pulse 68 72 65     Physical Exam Vitals reviewed.  Constitutional:      Appearance: Normal appearance.  HENT:     Head: Normocephalic and atraumatic.  Cardiovascular:     Rate and Rhythm: Normal rate and regular rhythm.  Pulmonary:     Effort: Pulmonary effort is normal.     Breath sounds: Normal breath sounds.  Musculoskeletal:     Cervical back: Neck supple.  Skin:    General: Skin is warm and dry.  Neurological:     Mental Status: He is alert and oriented to person, place, and time.  Psychiatric:        Mood and Affect: Mood normal.        Behavior: Behavior normal.        Thought Content: Thought content normal.        Judgment: Judgment  normal.          Assessment and Plan   1. Essential hypertension      Plan: 1.  Blood pressure appears acceptable without lisinopril so we will continue him off of that medication for right now.  We will recheck metabolic panel to monitor potassium levels as well.  He is encouraged to check his blood pressure at home and either only a T-shirt or tank top and avoid any tight or restrictive clothing while checking blood pressure.  He is encouraged to let me know if blood pressure starts to become elevated at home between now and his next appointment.  He tells me he understands.   Tests ordered Orders Placed This Encounter  Procedures  . CMP with eGFR([facility])      No orders of the defined types were placed in this encounter.   Patient to follow-up in 3 months or sooner as needed.  Ailene Ards, NP

## 2020-02-23 ENCOUNTER — Encounter (INDEPENDENT_AMBULATORY_CARE_PROVIDER_SITE_OTHER): Payer: Self-pay | Admitting: Nurse Practitioner

## 2020-03-07 ENCOUNTER — Other Ambulatory Visit: Payer: Self-pay

## 2020-03-07 ENCOUNTER — Ambulatory Visit: Payer: Medicare HMO | Admitting: Physician Assistant

## 2020-03-07 ENCOUNTER — Ambulatory Visit (HOSPITAL_COMMUNITY)
Admission: RE | Admit: 2020-03-07 | Discharge: 2020-03-07 | Disposition: A | Discharge status: Home / Self Care | Payer: Medicare HMO | Source: Ambulatory Visit | Attending: Physician Assistant | Admitting: Physician Assistant

## 2020-03-07 VITALS — BP 132/74 | HR 63 | Temp 98.3°F | Resp 20 | Ht 73.0 in | Wt 207.3 lb

## 2020-03-07 DIAGNOSIS — I6522 Occlusion and stenosis of left carotid artery: Secondary | ICD-10-CM | POA: Diagnosis not present

## 2020-03-07 NOTE — Progress Notes (Signed)
HISTORY AND PHYSICAL     CC:  follow up. Requesting Provider:  [name], MD  HPI: This is a 70 y.o. male here for follow up for carotid artery stenosis.  Pt is s/p left CEA for symptomatic carotid artery stenosis on 08/01/2016 by Dr. Scot Dock.    Pt was last seen 03/08/2019 and at that time he was doing well without stroke sx.  He was walking one mile everyday.  He did not have any dizziness or heaviness in his arms.    Pt returns today for follow up.   Pt denies any amaurosis fugax, speech difficulties, weakness, numbness, paralysis or clumsiness or facial droop.  Says he has done well over the past year.  He denies any claudication or non healing wounds.  He has continued his walking regimen as [place].  He states he keeps close follow up with cardiology.  He was seen a couple of weeks ago by his PCP for check on his hypertension.  His blood pressures were acceptable and he is continued on his BB and HCTZ.    The pt is on a statin for cholesterol management.  The pt is on a daily aspirin.   Other AC:  Plavix The pt is on BB for hypertension.   The pt is not diabetic.   Tobacco hx:  remote  Pt does not have family hx of AAA.  Past Medical History:  Diagnosis Date  . Colon polyps   . CVA (cerebral vascular accident) ([place]) 06/21/2016  . Diabetes mellitus without complication ([place]) [id number]  . Hyperlipidemia   . Hypertension   . Myocardial infarction ([place])   . Pneumonia   . Pre-diabetes   . Substance abuse [facility])     Past Surgical History:  Procedure Laterality Date  . CAROTID ENDARTERECTOMY Left   . COLON SURGERY  [phone]   growth removed, Laparoscopic Vs. open Left hemicolectomy Dr. Winter 09-25-17  . COLONOSCOPY N/A 06/26/2017   Procedure: COLONOSCOPY;  Surgeon: [name], MD;  [facility];  Service: Endoscopy;  Laterality: N/A;  730  . COLONOSCOPY N/A 12/01/2018   Procedure: COLONOSCOPY;  Surgeon: [name], MD;  [facility];  Service: Endoscopy;  Laterality: N/A;  930  . COLONOSCOPY WITH PROPOFOL N/A 10/07/2017   Procedure: COLONOSCOPY WITH PROPOFOL WITH TATTOO;  Surgeon: [name], MD;  [facility];  Service: General;  Laterality: N/A;  . ENDARTERECTOMY Left 08/01/2016   Procedure: ENDARTERECTOMY LEFT CAROTID;  Surgeon: [name], MD;  Location: [place];  Service: Vascular;  Laterality: Left;  . FLEXIBLE SIGMOIDOSCOPY N/A 10/08/2017   Procedure: FLEXIBLE SIGMOIDOSCOPY;  Surgeon: [name], MD;  [facility];  Service: General;  Laterality: N/A;  . LAPAROSCOPIC RIGHT HEMI COLECTOMY Left 10/08/2017   Procedure: LAPAROSCOPIC VERSES OPEN LEFT HEMI COLECTOMY ERAS PATHWAY;  Surgeon: [name], MD;  [facility];  Service: General;  Laterality: Left;  . PATCH ANGIOPLASTY Left 08/01/2016   Procedure: PATCH ANGIOPLASTY LEFT CAROTID ARTERY USING ZENOSURE BIOLOGIC PATCH;  Surgeon: [name], MD;  Location: [place];  Service: Vascular;  Laterality: Left;  . POLYPECTOMY  12/01/2018   Procedure: POLYPECTOMY;  Surgeon: [name], MD;  [facility];  Service: Endoscopy;;  . SUBMUCOSAL INJECTION  10/07/2017   Procedure: SUBMUCOSAL INJECTION;  Surgeon: [name], MD;  [facility];  Service: General;;  . TONSILLECTOMY  Allergies  Allergen Reactions  . Blood-Group Specific Substance     No whole blood products    Current Outpatient Medications  Medication Sig Dispense Refill  . aspirin EC 81 MG tablet Take 1 tablet (81 mg total) by mouth daily. 90 tablet 3  . atorvastatin (LIPITOR) 80 MG tablet TAKE 1 TABLET BY MOUTH EVERY DAY 90 tablet 0  . carvedilol (COREG) 3.125 MG tablet TAKE 1 TABLET(3.125 MG) BY MOUTH TWICE DAILY WITH A MEAL 180 tablet 0  . Cholecalciferol (VITAMIN D3) 250 MCG (10000 UT) capsule Take 10,000 Units by mouth daily.    . clopidogrel (PLAVIX) 75 MG tablet TAKE 1  TABLET BY MOUTH DAILY 90 tablet 0  . hydrochlorothiazide (HYDRODIURIL) 25 MG tablet Take 1 tablet (25 mg total) by mouth daily. 30 tablet 3   No current facility-administered medications for this visit.    Family History  Problem Relation Age of Onset  . Breast cancer Mother        lived to be 27  . CVA Mother   . CAD Mother   . Arthritis Mother   . Cancer Mother   . Hypertension Mother   . Other Father        gunshot wound  . Early death Father        GSW  . Cancer Brother        Stomach Cancer  . Cancer Brother        Lung Cancer  . Stroke Maternal Aunt   . Alcohol abuse Maternal Uncle   . Stroke Maternal Grandfather   . Colon cancer Neg Hx     Social History   Socioeconomic History  . Marital status: Divorced    Spouse name: Not on file  . Number of children: 2  . Years of education: 70  . Highest education level: Not on file  Occupational History  . Occupation: disabled    Comment: alcoholic  Tobacco Use  . Smoking status: Former Smoker    Packs/day: 0.50    Years: 29.00    Pack years: 14.50    Types: Cigarettes    Quit date: 2006    Years since quitting: 16.0  . Smokeless tobacco: Never Used  Vaping Use  . Vaping Use: Never used  Substance and Sexual Activity  . Alcohol use: Not Currently    Comment: quit 2006  . Drug use: Not Currently    Comment: remote h/o heavy marijuana use, also used cocaine and others  . Sexual activity: Not Currently  Other Topics Concern  . 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, EMT certified      Lives alone   Likes to walk      2 sons -in [place] and [place]   Social Determinants of Health   Financial Resource Strain: Not on file  Food Insecurity: Not on file  Transportation Needs: Not on file  Physical Activity: Not on file  Stress: Not on file  Social Connections: Not on file  Intimate Partner Violence: Not on file     REVIEW OF SYSTEMS:   [X]  denotes  positive finding, [ ]  denotes negative finding Cardiac  Comments:  Chest pain or chest pressure:    Shortness of breath upon exertion:    Short of breath when lying flat:    Irregular heart rhythm:        Vascular    Pain in calf, thigh, or  hip brought on by ambulation:    Pain in feet at night that wakes you up from your sleep:     Blood clot in your veins:    Leg swelling:         Pulmonary    Oxygen at home:    Productive cough:     Wheezing:         Neurologic    Sudden weakness in arms or legs:     Sudden numbness in arms or legs:     Sudden onset of difficulty speaking or slurred speech:    Temporary loss of vision in one eye:     Problems with dizziness:         Gastrointestinal    Blood in stool:     Vomited blood:         Genitourinary    Burning when urinating:     Blood in urine:        Psychiatric    Major depression:         Hematologic    Bleeding problems:    Problems with blood clotting too easily:        Skin    Rashes or ulcers:        Constitutional    Fever or chills:      PHYSICAL EXAMINATION:  Today's Vitals   03/07/20 1030 03/07/20 1032  BP: 120/77 132/74  Pulse: 63   Resp: 20   Temp: 98.3 F (36.8 C)   TempSrc: Temporal   SpO2: 98%   Weight: 207 lb 4.8 oz (94 kg)   Height: 6\' 1"  (1.854 m)    Body mass index is 27.35 kg/m.   General:  WDWN in NAD; vital signs documented above Gait: Not observed HENT: WNL, normocephalic Pulmonary: normal non-labored breathing Cardiac: regular HR, without carotid bruits Abdomen: soft, NT, no masses; aortic pulse is not palpable Skin: without rashes; well healed left neck incision Vascular Exam/Pulses:  Right Left  Radial 2+ (normal) 2+ (normal)   Extremities: without ischemic changes, without Gangrene , without cellulitis; without open wounds Musculoskeletal: no muscle wasting or atrophy  Neurologic: A&O X 3 Psychiatric:  The pt has Normal affect.   Non-Invasive Vascular Imaging:    Carotid Duplex on 03/07/2020: Right:  1-39% ICA stenosis Left:  1-39% ICA stenosis Vertebrals: Bilateral vertebral arteries demonstrate antegrade flow. The  right vertebral artery somewhat difficult to visualize  Subclavians: Normal flow hemodynamics were seen in bilateral subclavian arteries.   Previous Carotid duplex on 03/08/2019: Right: 1-39% ICA stenosis Left:   1-39% ICA stenosis  Vertebrals: Left vertebral artery demonstrates antegrade flow. Right  vertebral artery demonstrates retrograde flow.  Subclavians: Normal flow hemodynamics were seen in the left subclavian  artery.  Right subclavian artery is monophasic. The brachial and  radial arteries are triphasic.   ASSESSMENT/PLAN:: 70 y.o. male here for follow up carotid artery stenosis and is s/p  left CEA for symptomatic carotid artery stenosis on 08/01/2016 by Dr. Scot Dock.    -duplex today reveals 1-39% bilateral ICA stenosis.  Pt remains asymptomatic. -discussed s/s of stroke with pt and he understands should he develop any of these sx, he will go to the nearest ER. -continue statin/asa -BP continues to be under good control - this is being followed closely by his PCP.   -he will continue his walking program. -pt will f/u in one with carotid duplex -pt will call sooner should they have any issues.   Leontine Locket, PAC  [facility] [phone]  Clinic MD:  Scot Dock

## 2020-03-14 ENCOUNTER — Other Ambulatory Visit: Payer: Self-pay

## 2020-03-14 ENCOUNTER — Encounter: Payer: Self-pay | Admitting: Podiatry

## 2020-03-14 ENCOUNTER — Ambulatory Visit: Payer: Medicare HMO | Admitting: Podiatry

## 2020-03-14 DIAGNOSIS — E119 Type 2 diabetes mellitus without complications: Secondary | ICD-10-CM | POA: Diagnosis not present

## 2020-03-14 DIAGNOSIS — M79675 Pain in left toe(s): Secondary | ICD-10-CM | POA: Diagnosis not present

## 2020-03-14 DIAGNOSIS — M79674 Pain in right toe(s): Secondary | ICD-10-CM | POA: Diagnosis not present

## 2020-03-14 DIAGNOSIS — B351 Tinea unguium: Secondary | ICD-10-CM

## 2020-03-14 NOTE — Progress Notes (Signed)
This patient returns to my office for at risk foot care.  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  Patient is taking plavix.  This patient is unable to cut nails himself since the patient cannot reach his nails.These nails are painful walking and wearing shoes.  This patient presents for at risk foot care today.  General Appearance  Alert, conversant and in no acute stress.  Vascular  Dorsalis pedis and posterior tibial  pulses are weakly  palpable  bilaterally.  Capillary return is within normal limits  bilaterally. Temperature is within normal limits  bilaterally.  Neurologic  Senn-Weinstein monofilament wire test within normal limits/diminished   bilaterally. Muscle power within normal limits bilaterally.  Nails Thick disfigured discolored nails with subungual debris  from hallux to fifth toes bilaterally. No evidence of bacterial infection or drainage bilaterally.  Orthopedic  No limitations of motion  feet .  No crepitus or effusions noted.  No bony pathology or digital deformities noted. HAV  B/L.  Skin  normotropic skin with no porokeratosis noted bilaterally.  No signs of infections or ulcers noted.     Onychomycosis  Pain in right toes  Pain in left toes  Consent was obtained for treatment procedures.   Mechanical debridement of nails 1-5  bilaterally performed with a nail nipper.  Filed with dremel without incident.    Return office visit   3 months                  Told patient to return for periodic foot care and evaluation due to potential at risk complications.   Deniss Wormley DPM  

## 2020-03-22 ENCOUNTER — Ambulatory Visit (INDEPENDENT_AMBULATORY_CARE_PROVIDER_SITE_OTHER): Payer: Medicare HMO | Admitting: Internal Medicine

## 2020-04-12 ENCOUNTER — Other Ambulatory Visit (INDEPENDENT_AMBULATORY_CARE_PROVIDER_SITE_OTHER): Payer: Self-pay | Admitting: Internal Medicine

## 2020-04-19 ENCOUNTER — Other Ambulatory Visit (INDEPENDENT_AMBULATORY_CARE_PROVIDER_SITE_OTHER): Payer: Self-pay | Admitting: Internal Medicine

## 2020-05-09 NOTE — Progress Notes (Unsigned)
Cardiology Office Note   Date:  05/10/2020   ID:  Albert Morris, DOB [birth year], MRN [id number]  PCP:  [name], MD  Cardiologist:   [name], MD   Patient presents for f/u of mild LV dyfunction  The pt was previously followed by Albert Morris  Last seen in March 2021    History of Present Illness: Albert Morris is a 70 y.o. male with a history of CVA in 2018; L CEA 2018, mild LV dysfunction LVEF 45 to 50% with basal inferoseptal akinesis,   Myovue in 07/03/16 showed inferior infar (moderate intensity base to apex)  LVEF 48%  Pt was last seen in March 2021  Echo in April 2021 showed LVEF normal     The patient denies CP  Breathing is OK   NO dizziness   No edema      Current Meds  Medication Sig  . aspirin EC 81 MG tablet Take 1 tablet (81 mg total) by mouth daily.  [name] atorvastatin (LIPITOR) 80 MG tablet TAKE 1 TABLET BY MOUTH EVERY DAY  . carvedilol (COREG) 3.125 MG tablet TAKE 1 TABLET(3.125 MG) BY MOUTH TWICE DAILY WITH A MEAL  . Cholecalciferol (VITAMIN D3) 250 MCG (10000 UT) capsule Take 10,000 Units by mouth daily.  . clopidogrel (PLAVIX) 75 MG tablet TAKE 1 TABLET BY MOUTH DAILY  . hydrochlorothiazide (HYDRODIURIL) 25 MG tablet Take 1 tablet (25 mg total) by mouth daily.     Allergies:   Blood-group specific substance   Past Medical History:  Diagnosis Date  . Colon polyps   . CVA (cerebral vascular accident) ([place]) 06/21/2016  . Diabetes mellitus without complication ([place]) 7/00/1749  . Hyperlipidemia   . Hypertension   . Myocardial infarction ([place])   . Pneumonia   . Pre-diabetes   . Substance abuse [facility])     Past Surgical History:  Procedure Laterality Date  . CAROTID ENDARTERECTOMY Left   . COLON SURGERY  [phone]   growth removed, Laparoscopic Vs. open Left hemicolectomy Dr. Winter 09-25-17  . COLONOSCOPY N/A 06/26/2017   Procedure: COLONOSCOPY;  Surgeon: [name], MD;  [facility];  Service: Endoscopy;  Laterality:  N/A;  730  . COLONOSCOPY N/A 12/01/2018   Procedure: COLONOSCOPY;  Surgeon: [name], MD;  [facility];  Service: Endoscopy;  Laterality: N/A;  930  . COLONOSCOPY WITH PROPOFOL N/A 10/07/2017   Procedure: COLONOSCOPY WITH PROPOFOL WITH TATTOO;  Surgeon: [name], MD;  [facility];  Service: General;  Laterality: N/A;  . ENDARTERECTOMY Left 08/01/2016   Procedure: ENDARTERECTOMY LEFT CAROTID;  Surgeon: [name], MD;  Location: [place];  Service: Vascular;  Laterality: Left;  . FLEXIBLE SIGMOIDOSCOPY N/A 10/08/2017   Procedure: FLEXIBLE SIGMOIDOSCOPY;  Surgeon: [name], MD;  [facility];  Service: General;  Laterality: N/A;  . LAPAROSCOPIC RIGHT HEMI COLECTOMY Left 10/08/2017   Procedure: LAPAROSCOPIC VERSES OPEN LEFT HEMI COLECTOMY ERAS PATHWAY;  Surgeon: [name], MD;  [facility];  Service: General;  Laterality: Left;  . PATCH ANGIOPLASTY Left 08/01/2016   Procedure: PATCH ANGIOPLASTY LEFT CAROTID ARTERY USING ZENOSURE BIOLOGIC PATCH;  Surgeon: [name], MD;  Location: [place];  Service: Vascular;  Laterality: Left;  . POLYPECTOMY  12/01/2018   Procedure: POLYPECTOMY;  Surgeon: [name], MD;  [facility];  Service: Endoscopy;;  . SUBMUCOSAL INJECTION  10/07/2017   Procedure: SUBMUCOSAL INJECTION;  Surgeon: [name]  M, MD;  [facility];  Service: General;;  . TONSILLECTOMY       Social History:  The patient  reports that he quit smoking about 16 years ago. His smoking use included cigarettes. He has a 14.50 pack-year smoking history. He has never used smokeless tobacco. He reports previous alcohol use. He reports previous drug use.   Family History:  The patient's family history includes Alcohol abuse in his maternal uncle; Arthritis in his mother; Breast cancer in his mother; CAD in his mother; CVA in his mother; Cancer in his brother, brother, and mother;  Early death in his father; Hypertension in his mother; Other in his father; Stroke in his maternal aunt and maternal grandfather.    ROS:  Please see the history of present illness. All other systems are reviewed and  Negative to the above problem except as noted.    PHYSICAL EXAM: VS:  BP 122/70   Pulse 69   Ht 6\' 3"  (1.905 m)   Wt 209 lb (94.8 kg)   SpO2 96%   BMI 26.12 kg/m   GEN: Well nourished, well developed, in no acute distress  HEENT: normal  Neck: no JVD, carotid bruits Cardiac: RRR; no murmurs,no LE  edema  Respiratory:  clear to auscultation bilaterally, normal work of breathing GI: soft, nontender, nondistended, + BS  No hepatomegaly  MS: no deformity Moving all extremities   Skin: warm and dry, no rash Neuro:  Strength and sensation are intact Psych: euthymic mood, full affect   EKG:  EKG is ordered today.  SR 68 bpm   LVH with repolartization abnormality   Occasional PVC  IWMI No signifi change from previous  Myovue May 2018   No diagnostic ST segment changes over baseline abnormalities. Rare PVC.  Medium sized, moderate intensity, inferior defect from apex to base that is fixed, more prominent on rest imaging than stress. Most consistent with scar, although there could be a component of soft tissue attenuation. There are no large ischemic territories noted.  This is an intermediate risk study.  Nuclear stress EF: 48%.     Echo   05/25/19  1. Left ventricular ejection fraction, by estimation, is 65 to 70%. The left ventricle has normal function. The left ventricle has no regional wall motion abnormalities. There is moderate left ventricular hypertrophy. Left ventricular diastolic parameters are consistent with Grade I diastolic dysfunction (impaired relaxation). 2. Right ventricular systolic function is normal. The right ventricular size is normal. There is mildly elevated pulmonary artery systolic pressure. 3. The mitral valve is normal in structure. No  evidence of mitral valve regurgitation. No evidence of mitral stenosis. 4. The aortic valve has an indeterminant number of cusps. Aortic valve regurgitation is mild to moderate. No aortic stenosis is present. 5. Aortic dilatation noted. There is mild dilatation of the ascending aorta measuring 40 mm. 6. The inferior vena cava is normal in size with greater than 50% respiratory variability, suggesting right atrial pressure of 3 mmHg.  Carotid USN  Jan 2022  Left Carotid: Patent carotid endarterectomy with velocity in the left ICA consistent with a 1-39% stenosis. Vertebrals: Bilateral vertebral arteries demonstrate antegrade flow. The right vertebral artery somewhat difficult to visualize Subclavians: Normal flow hemodynamics were seen in bilateral subclavian arteries.  Lipid Panel    Component Value Date/Time   CHOL 153 02/02/2020 1035   TRIG 92 02/02/2020 1035   HDL 44 02/02/2020 1035   CHOLHDL 3.5 02/02/2020 1035   VLDL 13 09/17/2016 0720  [place] 90 02/02/2020 1035      Wt Readings from Last 3 Encounters:  05/10/20 209 lb (94.8 kg)  03/07/20 207 lb 4.8 oz (94 kg)  02/22/20 207 lb 3.2 oz (94 kg)      ASSESSMENT AND PLAN:  1  Hx of LV dysfunction    Presumed due to CAD with some reginal wall changes and nuclear scan in May 201 showed inferior inferact    Last echo in April 2021 showed LVEF normal with normal wall motion  Pt denies symptoms of angina   Volume status looks good    I would continue on current regimen    2  Hx CV dz   S/p LCEA   Followed in vascular   Keep on medical Rx  3  HL  Will get lipids   In Dec LDL was 90, up from previous   Reconfirm befure adjusting meds    4  HTN  BP is good  Keep on current regimen    F/U next year    Current medicines are reviewed at length with the patient today.  The patient does not have concerns regarding medicines.  Signed, [name], MD  05/10/2020 4:48 PM    [address], [place], [place]  [address] Phone: [phone]; Fax: [phone]

## 2020-05-10 ENCOUNTER — Encounter: Payer: Self-pay | Admitting: Internal Medicine

## 2020-05-10 ENCOUNTER — Other Ambulatory Visit: Payer: Self-pay

## 2020-05-10 ENCOUNTER — Ambulatory Visit: Payer: Medicare HMO | Admitting: Internal Medicine

## 2020-05-10 VITALS — BP 122/70 | HR 69 | Ht 75.0 in | Wt 209.0 lb

## 2020-05-10 DIAGNOSIS — I2510 Atherosclerotic heart disease of native coronary artery without angina pectoris: Secondary | ICD-10-CM | POA: Diagnosis not present

## 2020-05-10 DIAGNOSIS — I10 Essential (primary) hypertension: Secondary | ICD-10-CM | POA: Diagnosis not present

## 2020-05-10 DIAGNOSIS — E785 Hyperlipidemia, unspecified: Secondary | ICD-10-CM

## 2020-05-10 NOTE — Patient Instructions (Signed)
Medication Instructions:  Your physician recommends that you continue on your current medications as directed. Please refer to the Current Medication list given to you today.  *If you need a refill on your cardiac medications before your next appointment, please call your pharmacy*   Lab Work: Your physician recommends that you return for lab work in: Today   If you have labs (blood work) drawn today and your tests are completely normal, you will receive your results only by: [name] MyChart Message (if you have MyChart) OR . A paper copy in the mail If you have any lab test that is abnormal or we need to change your treatment, we will call you to review the results.   Testing/Procedures: NONE    Follow-Up: At [facility], you and your health needs are our priority.  As part of our continuing mission to provide you with exceptional heart care, we have created designated Provider Care Teams.  These Care Teams include your primary Cardiologist (physician) and Advanced Practice Providers (APPs -  Physician Assistants and Nurse Practitioners) who all work together to provide you with the care you need, when you need it.  We recommend signing up for the patient portal called "MyChart".  Sign up information is provided on this After Visit Summary.  MyChart is used to connect with patients for Virtual Visits (Telemedicine).  Patients are able to view lab/test results, encounter notes, upcoming appointments, etc.  Non-urgent messages can be sent to your provider as well.   To learn more about what you can do with MyChart, go to [email or web address].    Your next appointment:   1 year(s)  The format for your next appointment:   In Person  Provider:   [name], MD   Other Instructions Thank you for choosing [facility]!

## 2020-05-21 ENCOUNTER — Other Ambulatory Visit: Payer: Self-pay

## 2020-05-21 ENCOUNTER — Encounter (INDEPENDENT_AMBULATORY_CARE_PROVIDER_SITE_OTHER): Payer: Self-pay | Admitting: Internal Medicine

## 2020-05-21 ENCOUNTER — Ambulatory Visit (INDEPENDENT_AMBULATORY_CARE_PROVIDER_SITE_OTHER): Payer: Medicare HMO | Admitting: Internal Medicine

## 2020-05-21 VITALS — BP 116/70 | HR 64 | Temp 97.0°F | Ht 73.0 in | Wt 208.4 lb

## 2020-05-21 DIAGNOSIS — E785 Hyperlipidemia, unspecified: Secondary | ICD-10-CM | POA: Diagnosis not present

## 2020-05-21 DIAGNOSIS — I10 Essential (primary) hypertension: Secondary | ICD-10-CM

## 2020-05-21 DIAGNOSIS — E119 Type 2 diabetes mellitus without complications: Secondary | ICD-10-CM

## 2020-05-21 MED ORDER — CARVEDILOL 3.125 MG PO TABS: 3.1250 mg | ORAL_TABLET | Freq: Two times a day (BID) | ORAL | 0 refills | Status: DC

## 2020-05-21 MED ORDER — HYDROCHLOROTHIAZIDE 25 MG PO TABS: 25.0000 mg | ORAL_TABLET | Freq: Every day | ORAL | 1 refills | Status: DC

## 2020-05-21 NOTE — Progress Notes (Signed)
Metrics: Intervention Frequency ACO  Documented Smoking Status Yearly  Screened one or more times in 24 months  Cessation Counseling or  Active cessation medication Past 24 months  Past 24 months   Guideline developer: UpToDate (See UpToDate for funding source) Date Released: 2014       Wellness Office Visit  Subjective:  Patient ID: Albert Morris, male    DOB: 09-08-1950  Age: 70 y.o. MRN: [id number]  CC: This man comes in for follow-up of diabetes, hypertension, dyslipidemia. HPI  His diabetes is diet controlled and his last A1c was below 6.5%.  He continues on statin therapy in the face of coronary artery disease. He continues on antihypertensive medications as before. He is frustrated with the [place] who have still not given him his first COVID-19 vaccine. Past Medical History:  Diagnosis Date  . Colon polyps   . CVA (cerebral vascular accident) ([place]) 06/21/2016  . Diabetes mellitus without complication ([place]) 08/13/348  . Hyperlipidemia   . Hypertension   . Myocardial infarction ([place])   . Pneumonia   . Pre-diabetes   . Substance abuse [facility])    Past Surgical History:  Procedure Laterality Date  . CAROTID ENDARTERECTOMY Left   . COLON SURGERY  [phone]   growth removed, Laparoscopic Vs. open Left hemicolectomy Dr. Winter 09-25-17  . COLONOSCOPY N/A 06/26/2017   Procedure: COLONOSCOPY;  Surgeon: [name], MD;  [facility];  Service: Endoscopy;  Laterality: N/A;  730  . COLONOSCOPY N/A 12/01/2018   Procedure: COLONOSCOPY;  Surgeon: [name], MD;  [facility];  Service: Endoscopy;  Laterality: N/A;  930  . COLONOSCOPY WITH PROPOFOL N/A 10/07/2017   Procedure: COLONOSCOPY WITH PROPOFOL WITH TATTOO;  Surgeon: [name], MD;  [facility];  Service: General;  Laterality: N/A;  . ENDARTERECTOMY Left 08/01/2016   Procedure: ENDARTERECTOMY LEFT CAROTID;  Surgeon: [name], MD;  Location: [place];  Service:  Vascular;  Laterality: Left;  . FLEXIBLE SIGMOIDOSCOPY N/A 10/08/2017   Procedure: FLEXIBLE SIGMOIDOSCOPY;  Surgeon: [name], MD;  [facility];  Service: General;  Laterality: N/A;  . LAPAROSCOPIC RIGHT HEMI COLECTOMY Left 10/08/2017   Procedure: LAPAROSCOPIC VERSES OPEN LEFT HEMI COLECTOMY ERAS PATHWAY;  Surgeon: [name], MD;  [facility];  Service: General;  Laterality: Left;  . PATCH ANGIOPLASTY Left 08/01/2016   Procedure: PATCH ANGIOPLASTY LEFT CAROTID ARTERY USING ZENOSURE BIOLOGIC PATCH;  Surgeon: [name], MD;  Location: [place];  Service: Vascular;  Laterality: Left;  . POLYPECTOMY  12/01/2018   Procedure: POLYPECTOMY;  Surgeon: [name], MD;  [facility];  Service: Endoscopy;;  . SUBMUCOSAL INJECTION  10/07/2017   Procedure: SUBMUCOSAL INJECTION;  Surgeon: [name], MD;  Location: [facility];  Service: General;;  . TONSILLECTOMY       Family History  Problem Relation Age of Onset  . Breast cancer Mother        lived to be 28  . CVA Mother   . CAD Mother   . Arthritis Mother   . Cancer Mother   . Hypertension Mother   . Other Father        gunshot wound  . Early death Father        GSW  . Cancer Brother        Stomach Cancer  . Cancer Brother        Lung Cancer  . Stroke Maternal Aunt   .  Alcohol abuse Maternal Uncle   . Stroke Maternal Grandfather   . 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 Smoking status: Former Smoker    Packs/day: 0.50    Years: 29.00    Pack years: 14.50    Types: Cigarettes    Quit date: 2006    Years since quitting: 16.2  . Smokeless tobacco: Never Used  Substance Use Topics  . Alcohol use: Not Currently    Comment: quit 2006    Current Meds  Medication  Sig  . aspirin EC 81 MG tablet Take 1 tablet (81 mg total) by mouth daily.  [name] atorvastatin (LIPITOR) 80 MG tablet TAKE 1 TABLET BY MOUTH EVERY DAY  . Cholecalciferol (VITAMIN D3) 250 MCG (10000 UT) capsule Take 10,000 Units by mouth daily.  . clopidogrel (PLAVIX) 75 MG tablet TAKE 1 TABLET BY MOUTH DAILY  . [DISCONTINUED] carvedilol (COREG) 3.125 MG tablet TAKE 1 TABLET(3.125 MG) BY MOUTH TWICE DAILY WITH A MEAL  . [DISCONTINUED] hydrochlorothiazide (HYDRODIURIL) 25 MG tablet Take 1 tablet (25 mg total) by mouth daily.     [place] Office Visit from 05/21/2020 in [facility]  PHQ-9 Total Score 0      Objective:   Today's Vitals: BP 116/70   Pulse 64   Temp (!) 97 F (36.1 C) (Temporal)   Ht 6\' 1"  (1.854 m)   Wt 208 lb 6.4 oz (94.5 kg)   SpO2 99%   BMI 27.50 kg/m  Vitals with BMI 05/21/2020 05/10/2020 03/07/2020  Height 6\' 1"  6\' 3"  -  Weight 208 lbs 6 oz 209 lbs -  BMI 76.2 26.33 -  Systolic 354 562 563  Diastolic 70 70 74  Pulse 64 69 -     Physical Exam  He looks systemically well.  Weight is stable.  Blood pressure is excellent.     Assessment   1. Essential hypertension   2. Dyslipidemia   3. Diabetes mellitus without complication ([place])       Tests ordered Orders Placed This Encounter  Procedures  . COMPLETE METABOLIC PANEL WITH GFR  . Hemoglobin A1c  . Lipid panel     Plan: 1. Continue with antihypertensive medication above and I have refilled hydrochlorothiazide and carvedilol today. 2. Continue with statin therapy as before and we will check a lipid panel. 3. Continue to focus on nutrition and we will check an A1c to see the state of his diabetes. 4. Further recommendations will depend on blood results and he will follow-up with Sarah in about 4 months   Meds ordered this encounter  Medications  . carvedilol (COREG) 3.125 MG tablet    Sig: Take 1 tablet (3.125 mg total) by mouth 2 (two) times daily with a meal.    Dispense:  180  tablet    Refill:  0  . hydrochlorothiazide (HYDRODIURIL) 25 MG tablet    Sig: Take 1 tablet (25 mg total) by mouth daily.    Dispense:  90 tablet    Refill:  1    Jarris Kortz Luther Parody, MD

## 2020-05-22 ENCOUNTER — Encounter (INDEPENDENT_AMBULATORY_CARE_PROVIDER_SITE_OTHER): Payer: Self-pay | Admitting: Internal Medicine

## 2020-05-22 LAB — LIPID PANEL
Cholesterol: 147 mg/dL (ref <200)
HDL: 36 mg/dL — ABNORMAL LOW (ref >=40)
LDL Cholesterol (Calc): 86 mg/dL (calc)
Non-HDL Cholesterol (Calc): 111 mg/dL (calc) (ref <130)
Total CHOL/HDL Ratio: 4.1 (calc) (ref <5.0)
Triglycerides: 155 mg/dL — ABNORMAL HIGH (ref <150)

## 2020-05-22 LAB — COMPLETE METABOLIC PANEL WITH GFR
AG Ratio: 1.5 (calc) (ref 1.0–2.5)
ALT: 20 U/L (ref 9–46)
AST: 24 U/L (ref 10–35)
Albumin: 4.3 g/dL (ref 3.6–5.1)
Alkaline phosphatase (APISO): 85 U/L (ref 35–144)
BUN/Creatinine Ratio: 23 (calc) — ABNORMAL HIGH (ref 6–22)
BUN: 28 mg/dL — ABNORMAL HIGH (ref 7–25)
CO2: 26 mmol/L (ref 20–32)
Calcium: 9.4 mg/dL (ref 8.6–10.3)
Chloride: 99 mmol/L (ref 98–110)
Creat: 1.22 mg/dL — ABNORMAL HIGH (ref 0.70–1.18)
GFR, Est African American: 69 mL/min/{1.73_m2} (ref >=60)
GFR, Est Non African American: 60 mL/min/{1.73_m2} (ref >=60)
Globulin: 2.9 g/dL (calc) (ref 1.9–3.7)
Glucose, Bld: 110 mg/dL (ref 65–139)
Potassium: 4.8 mmol/L (ref 3.5–5.3)
Sodium: 135 mmol/L (ref 135–146)
Total Bilirubin: 0.6 mg/dL (ref 0.2–1.2)
Total Protein: 7.2 g/dL (ref 6.1–8.1)

## 2020-05-22 LAB — HEMOGLOBIN A1C
Hgb A1c MFr Bld: 6.6 % of total Hgb — ABNORMAL HIGH (ref <5.7)
Mean Plasma Glucose: 143 mg/dL
eAG (mmol/L): 7.9 mmol/L

## 2020-06-08 ENCOUNTER — Ambulatory Visit: Payer: Medicare HMO | Admitting: Podiatry

## 2020-06-08 ENCOUNTER — Other Ambulatory Visit: Payer: Self-pay

## 2020-06-08 ENCOUNTER — Encounter: Payer: Self-pay | Admitting: Podiatry

## 2020-06-08 DIAGNOSIS — M79674 Pain in right toe(s): Secondary | ICD-10-CM | POA: Diagnosis not present

## 2020-06-08 DIAGNOSIS — E119 Type 2 diabetes mellitus without complications: Secondary | ICD-10-CM | POA: Diagnosis not present

## 2020-06-08 DIAGNOSIS — B351 Tinea unguium: Secondary | ICD-10-CM | POA: Diagnosis not present

## 2020-06-08 DIAGNOSIS — M79675 Pain in left toe(s): Secondary | ICD-10-CM

## 2020-06-08 NOTE — Progress Notes (Signed)
This patient returns to my office for at risk foot care.  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  Patient is taking plavix.  This patient is unable to cut nails himself since the patient cannot reach his nails.These nails are painful walking and wearing shoes.  This patient presents for at risk foot care today.  General Appearance  Alert, conversant and in no acute stress.  Vascular  Dorsalis pedis and posterior tibial  pulses are weakly  palpable  bilaterally.  Capillary return is within normal limits  bilaterally. Temperature is within normal limits  bilaterally.  Neurologic  Senn-Weinstein monofilament wire test within normal limits/diminished   bilaterally. Muscle power within normal limits bilaterally.  Nails Thick disfigured discolored nails with subungual debris  from hallux to fifth toes bilaterally. No evidence of bacterial infection or drainage bilaterally.  Orthopedic  No limitations of motion  feet .  No crepitus or effusions noted.  No bony pathology or digital deformities noted. HAV  B/L.  Skin  normotropic skin with no porokeratosis noted bilaterally.  No signs of infections or ulcers noted.     Onychomycosis  Pain in right toes  Pain in left toes  Consent was obtained for treatment procedures.   Mechanical debridement of nails 1-5  bilaterally performed with a nail nipper.  Filed with dremel without incident.    Return office visit   3 months                  Told patient to return for periodic foot care and evaluation due to potential at risk complications.   Unnamed Zeien DPM  

## 2020-06-13 ENCOUNTER — Ambulatory Visit: Payer: Medicare HMO | Admitting: Podiatry

## 2020-06-18 DIAGNOSIS — E119 Type 2 diabetes mellitus without complications: Secondary | ICD-10-CM | POA: Diagnosis not present

## 2020-06-18 LAB — HM DIABETES EYE EXAM

## 2020-09-03 ENCOUNTER — Emergency Department (HOSPITAL_COMMUNITY)
Admission: EM | Admit: 2020-09-03 | Discharge: 2020-09-03 | Disposition: A | Discharge status: Home / Self Care | Payer: Medicare HMO | Attending: Emergency Medicine | Admitting: Emergency Medicine

## 2020-09-03 ENCOUNTER — Encounter (HOSPITAL_COMMUNITY): Payer: Self-pay | Admitting: Emergency Medicine

## 2020-09-03 ENCOUNTER — Other Ambulatory Visit: Payer: Self-pay

## 2020-09-03 ENCOUNTER — Emergency Department (HOSPITAL_COMMUNITY): Payer: Medicare HMO

## 2020-09-03 DIAGNOSIS — Z79899 Other long term (current) drug therapy: Secondary | ICD-10-CM | POA: Insufficient documentation

## 2020-09-03 DIAGNOSIS — I2510 Atherosclerotic heart disease of native coronary artery without angina pectoris: Secondary | ICD-10-CM | POA: Insufficient documentation

## 2020-09-03 DIAGNOSIS — Z87891 Personal history of nicotine dependence: Secondary | ICD-10-CM | POA: Insufficient documentation

## 2020-09-03 DIAGNOSIS — M7989 Other specified soft tissue disorders: Secondary | ICD-10-CM | POA: Diagnosis not present

## 2020-09-03 DIAGNOSIS — I10 Essential (primary) hypertension: Secondary | ICD-10-CM | POA: Insufficient documentation

## 2020-09-03 DIAGNOSIS — E119 Type 2 diabetes mellitus without complications: Secondary | ICD-10-CM | POA: Diagnosis not present

## 2020-09-03 DIAGNOSIS — S90111A Contusion of right great toe without damage to nail, initial encounter: Secondary | ICD-10-CM

## 2020-09-03 DIAGNOSIS — S90931A Unspecified superficial injury of right great toe, initial encounter: Secondary | ICD-10-CM | POA: Diagnosis present

## 2020-09-03 DIAGNOSIS — S99921A Unspecified injury of right foot, initial encounter: Secondary | ICD-10-CM | POA: Diagnosis not present

## 2020-09-03 DIAGNOSIS — Z7982 Long term (current) use of aspirin: Secondary | ICD-10-CM | POA: Insufficient documentation

## 2020-09-03 DIAGNOSIS — W2209XA Striking against other stationary object, initial encounter: Secondary | ICD-10-CM | POA: Diagnosis not present

## 2020-09-03 NOTE — ED Triage Notes (Signed)
Pt arrives with complaint of right great toe pain after hitting it on a stair on Friday.

## 2020-09-03 NOTE — Discharge Instructions (Addendum)
Your x-ray today does not show a broken bone. It is possible you sprained or bruised your toe when you injured it. Wear a supportive shoe, follow up with your PCP if pain continues. Take Tylenol as needed as directed for pain.

## 2020-09-03 NOTE — ED Provider Notes (Signed)
[facility] EMERGENCY DEPARTMENT Provider Note   CSN: [id number] Arrival date & time: 09/03/20  6962     History Chief Complaint  Patient presents with   Toe Injury    Albert Morris is a 70 y.o. male.  70 year old male with right great toe injury, stubbed on his deck on Friday. No other injuries, no swelling. Skin intact. Pain worse with walking.      Past Medical History:  Diagnosis Date   Colon polyps    CVA (cerebral vascular accident) ([place]) 06/21/2016   Diabetes mellitus without complication ([place]) 9/52/8413   Hyperlipidemia    Hypertension    Myocardial infarction ([place])    Pneumonia    Pre-diabetes    Substance abuse [facility])     Patient Active Problem List   Diagnosis Date Noted   Pain due to onychomycosis of toenails of both feet 12/14/2019   Medicare annual wellness visit, subsequent 01/26/2019   History of tobacco use 01/26/2019   Hx of colonic polyps 11/12/2018   Diabetes mellitus without complication ([place]) 24/40/1027   Colon polyp 10/08/2017   Positive colorectal cancer screening using Cologuard test 05/28/2017   Vitamin D deficiency 10/14/2016   History of carotid endarterectomy 10/14/2016   Left carotid artery stenosis 08/01/2016   History of MI (myocardial infarction) 07/08/2016   CAD in native artery 07/08/2016   History of substance abuse ([place]) 07/08/2016   Essential hypertension 07/07/2016   Dyslipidemia 07/07/2016   Dizziness    Stenosis of left carotid artery    Cardiomyopathy ([place])    Cerebellar stroke ([place]) 06/24/2016    Past Surgical History:  Procedure Laterality Date   CAROTID ENDARTERECTOMY Left    COLON SURGERY  [id number]   growth removed, Laparoscopic Vs. open Left hemicolectomy Dr. Lovena Neighbours 09-25-17   COLONOSCOPY N/A 06/26/2017   Procedure: COLONOSCOPY;  Surgeon: [name], MD;  [facility];  Service: Endoscopy;  Laterality: N/A;  730   COLONOSCOPY N/A 12/01/2018   Procedure: COLONOSCOPY;  Surgeon: [name], MD;  [facility];  Service: Endoscopy;  Laterality: N/A;  930   COLONOSCOPY WITH PROPOFOL N/A 10/07/2017   Procedure: COLONOSCOPY WITH PROPOFOL WITH TATTOO;  Surgeon: [name], MD;  [facility];  Service: General;  Laterality: N/A;   ENDARTERECTOMY Left 08/01/2016   Procedure: ENDARTERECTOMY LEFT CAROTID;  Surgeon: [name], MD;  Location: [place];  Service: Vascular;  Laterality: Left;   FLEXIBLE SIGMOIDOSCOPY N/A 10/08/2017   Procedure: FLEXIBLE SIGMOIDOSCOPY;  Surgeon: [name], MD;  [facility];  Service: General;  Laterality: N/A;   LAPAROSCOPIC RIGHT HEMI COLECTOMY Left 10/08/2017   Procedure: LAPAROSCOPIC VERSES OPEN LEFT HEMI COLECTOMY ERAS PATHWAY;  Surgeon: [name], MD;  [facility];  Service: General;  Laterality: Left;   PATCH ANGIOPLASTY Left 08/01/2016   Procedure: PATCH ANGIOPLASTY LEFT CAROTID ARTERY USING ZENOSURE BIOLOGIC PATCH;  Surgeon: [name], MD;  Location: [facility] OR;  Service: Vascular;  Laterality: Left;   POLYPECTOMY  12/01/2018   Procedure: POLYPECTOMY;  Surgeon: [name], MD;  [facility];  Service: Endoscopy;;   SUBMUCOSAL INJECTION  10/07/2017   Procedure: SUBMUCOSAL INJECTION;  Surgeon: [name], MD;  [facility];  Service: General;;   TONSILLECTOMY         Family History  Problem Relation Age of Onset   Breast cancer Mother        lived to be  22   CVA Mother    CAD Mother    Arthritis Mother    Cancer Mother    Hypertension Mother    Other Father        gunshot wound   Early death Father        GSW   Cancer Brother        Stomach Cancer   Cancer Brother        Lung Cancer   Stroke Maternal Aunt    Alcohol abuse Maternal Uncle    Stroke Maternal Grandfather    Colon cancer Neg Hx     Social History   Tobacco Use   Smoking status: Former    Packs/day: 0.50    Years: 29.00    Pack years: 14.50     Types: Cigarettes    Quit date: 2006    Years since quitting: 16.5   Smokeless tobacco: Never  Vaping Use   Vaping Use: Never used  Substance Use Topics   Alcohol use: Not Currently    Comment: quit 2006   Drug use: Not Currently    Comment: remote h/o heavy marijuana use, also used cocaine and others    Home Medications Prior to Admission medications   Medication Sig Start Date End Date Taking? Authorizing Provider  aspirin EC 81 MG tablet Take 1 tablet (81 mg total) by mouth daily. 04/17/17   Herminio Commons, MD  atorvastatin (LIPITOR) 80 MG tablet TAKE 1 TABLET BY MOUTH EVERY DAY 04/12/20   Hurshel Party C, MD  carvedilol (COREG) 3.125 MG tablet Take 1 tablet (3.125 mg total) by mouth 2 (two) times daily with a meal. 05/21/20   Gosrani, Nimish C, MD  Cholecalciferol (VITAMIN D3) 250 MCG (10000 UT) capsule Take 10,000 Units by mouth daily.    [provider]  hydrochlorothiazide (HYDRODIURIL) 25 MG tablet Take 1 tablet (25 mg total) by mouth daily. 05/21/20   [name], MD    Allergies    Blood-group specific substance  Review of Systems   Review of Systems  Constitutional:  Negative for fever.  Musculoskeletal:  Positive for arthralgias. Negative for joint swelling.  Skin:  Negative for color change, rash and wound.  Allergic/Immunologic: Positive for immunocompromised state.  Neurological:  Negative for weakness and numbness.   Physical Exam Updated Vital Signs BP 115/66 (BP Location: Right Arm)   Pulse 68   Temp 98.6 F (37 C) (Oral)   Resp 14   Ht 6\' 3"  (1.905 m)   Wt 92.5 kg   SpO2 98%   BMI 25.50 kg/m   Physical Exam Vitals and nursing note reviewed.  Constitutional:      General: He is not in acute distress.    Appearance: He is well-developed. He is not diaphoretic.  HENT:     Head: Normocephalic and atraumatic.  Cardiovascular:     Pulses: Normal pulses.  Pulmonary:     Effort: Pulmonary effort is normal.  Musculoskeletal:         General: Tenderness present. No swelling, deformity or signs of injury.     Right lower leg: No edema.     Left lower leg: No edema.     Comments: Tenderness to right great MTP, sensation intact  Skin:    General: Skin is warm and dry.     Findings: No erythema or rash.  Neurological:     Mental Status: He is alert and oriented to person, place, and time.  Psychiatric:  Behavior: Behavior normal.    ED Results / Procedures / Treatments   Labs (all labs ordered are listed, but only abnormal results are displayed) Labs Reviewed - No data to display  EKG None  Radiology DG Toe Great Right  Result Date: 09/03/2020 CLINICAL DATA:  70 year old male status post blunt trauma to the right great toe 3 days ago. Diabetes and hypertension. EXAM: RIGHT GREAT TOE COMPARISON:  None. FINDINGS: Soft tissue swelling at the distal great toe. Bone mineralization is within normal limits for age. There is no evidence of fracture or dislocation. Joint spaces and alignment within normal limits for age. IMPRESSION: Soft tissue swelling with no acute fracture or dislocation identified about the right great toe. Electronically Signed   By: Genevie Ann M.D.   On: 09/03/2020 09:59    Procedures Procedures   Medications Ordered in ED Medications - No data to display  ED Course  I have reviewed the triage vital signs and the nursing notes.  Pertinent labs & imaging results that were available during my care of the patient were reviewed by me and considered in my medical decision making (see chart for details).  Clinical Course as of 09/03/20 1346  Mon Sep 04, 7862  5824 70 year old male with right great toe pain after stubbing toe at home 3 days ago. Skin intact, sensation intact, DP pulse present with normal capillary refill. No erythema, no significant swelling. Tenderness at the MTP.  DC with supportive shoe, recommend tylenol PRN, recheck with pcp if pain continues.  [LM]    Clinical Course User  Index [LM] Roque Lias   MDM Rules/Calculators/A&P                          Final Clinical Impression(s) / ED Diagnoses Final diagnoses:  Contusion of right great toe without damage to nail, initial encounter    Rx / DC Orders ED Discharge Orders     None        Tacy Learn, PA-C 09/03/20 1346    Milton Ferguson, MD 09/06/20 1005

## 2020-09-13 ENCOUNTER — Ambulatory Visit (INDEPENDENT_AMBULATORY_CARE_PROVIDER_SITE_OTHER): Payer: Medicare HMO | Admitting: Nurse Practitioner

## 2020-09-14 ENCOUNTER — Ambulatory Visit: Payer: Medicare HMO | Admitting: Podiatry

## 2020-09-20 ENCOUNTER — Encounter (INDEPENDENT_AMBULATORY_CARE_PROVIDER_SITE_OTHER): Payer: Self-pay

## 2020-09-26 ENCOUNTER — Other Ambulatory Visit: Payer: Self-pay

## 2020-09-26 ENCOUNTER — Ambulatory Visit: Payer: Medicare HMO | Admitting: Podiatry

## 2020-09-26 DIAGNOSIS — M79674 Pain in right toe(s): Secondary | ICD-10-CM

## 2020-09-26 DIAGNOSIS — E119 Type 2 diabetes mellitus without complications: Secondary | ICD-10-CM

## 2020-09-26 DIAGNOSIS — S90129A Contusion of unspecified lesser toe(s) without damage to nail, initial encounter: Secondary | ICD-10-CM | POA: Insufficient documentation

## 2020-09-26 DIAGNOSIS — S90111D Contusion of right great toe without damage to nail, subsequent encounter: Secondary | ICD-10-CM | POA: Diagnosis not present

## 2020-09-26 NOTE — Progress Notes (Signed)
This patient returns to my office for at risk foot care.  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  Patient is taking plavix.  This patient is unable to cut nails himself since the patient cannot reach his nails.These nails are painful walking and wearing shoes. Patient says he injured his right big toe.  He went for exam and xrays were taken.  No fracture seen and patient was dispensed a shoes.  He says he has not worn his shoe since the shoe hurt the toe. This patient presents for at risk foot care today.  General Appearance  Alert, conversant and in no acute stress.  Vascular  Dorsalis pedis and posterior tibial  pulses are weakly  palpable  bilaterally.  Capillary return is within normal limits  bilaterally. Temperature is within normal limits  bilaterally.  Neurologic  Senn-Weinstein monofilament wire test within normal limits/diminished   bilaterally. Muscle power within normal limits bilaterally.  Nails Thick disfigured discolored nails with subungual debris  from hallux to fifth toes bilaterally. No evidence of bacterial infection or drainage bilaterally.  Orthopedic  No limitations of motion  feet .  No crepitus or effusions noted.  No bony pathology or digital deformities noted. HAV  B/L. Normal ROM  IPJ right foot.  No redness or swelling or palpable pain noted.  Skin  normotropic skin with no porokeratosis noted bilaterally.  No signs of infections or ulcers noted.     Onychomycosis  Pain in right toes  Pain in left toes  Consent was obtained for treatment procedures.   Mechanical debridement of nails 1-5  bilaterally performed with a nail nipper.  Filed with dremel without incident. Xrays were reviewed.  No fracture seen.  Padding dispensed.    Return office visit   3 months                  Told patient to return for periodic foot care and evaluation due to potential at risk  complications.   Gardiner Barefoot DPM

## 2020-10-03 ENCOUNTER — Telehealth (INDEPENDENT_AMBULATORY_CARE_PROVIDER_SITE_OTHER): Payer: Self-pay

## 2020-10-03 DIAGNOSIS — I10 Essential (primary) hypertension: Secondary | ICD-10-CM

## 2020-10-03 MED ORDER — LISINOPRIL 20 MG PO TABS: 20.0000 mg | ORAL_TABLET | Freq: Two times a day (BID) | ORAL | 0 refills | Status: DC

## 2020-10-03 NOTE — Telephone Encounter (Signed)
Refill request approved and sent to pharmacy

## 2020-10-03 NOTE — Telephone Encounter (Signed)
Received a refill request from [facility] on [address] for a request of the following medication:  lisinopril (ZESTRIL) 20 MG tablet  Last filled 04/23/2020

## 2020-10-24 ENCOUNTER — Other Ambulatory Visit: Payer: Self-pay

## 2020-10-24 ENCOUNTER — Ambulatory Visit
Admission: EM | Admit: 2020-10-24 | Discharge: 2020-10-24 | Disposition: A | Discharge status: Home / Self Care | Payer: Medicare HMO | Attending: Family Medicine | Admitting: Family Medicine

## 2020-10-24 ENCOUNTER — Encounter: Payer: Self-pay | Admitting: Emergency Medicine

## 2020-10-24 DIAGNOSIS — Z760 Encounter for issue of repeat prescription: Secondary | ICD-10-CM

## 2020-10-24 MED ORDER — HYDROCHLOROTHIAZIDE 25 MG PO TABS: 25.0000 mg | ORAL_TABLET | Freq: Every day | ORAL | 1 refills | Status: DC

## 2020-10-24 MED ORDER — ATORVASTATIN CALCIUM 80 MG PO TABS: 80.0000 mg | ORAL_TABLET | Freq: Every day | ORAL | 1 refills | Status: DC

## 2020-10-24 MED ORDER — CARVEDILOL 3.125 MG PO TABS: 3.1250 mg | ORAL_TABLET | Freq: Two times a day (BID) | ORAL | 1 refills | Status: DC

## 2020-10-24 MED ORDER — CLOPIDOGREL BISULFATE 75 MG PO TABS: 75.0000 mg | ORAL_TABLET | Freq: Every day | ORAL | 1 refills | Status: DC

## 2020-10-24 NOTE — ED Triage Notes (Signed)
Needs refills on medications.  Pcp passed away and does not have appt with new pcp until October.  Atorvastatin '80mg'$  daily Carvedilol 3.125 mg BID w/ meals  Clopidogrel 75 mg daily Hydrochlorothiazide 25 mg daily

## 2020-10-24 NOTE — ED Provider Notes (Signed)
[place]   [id number] 10/24/20 Arrival Time: [id number]  ASSESSMENT & PLAN:  1. Medication refill     Meds ordered this encounter  Medications   atorvastatin (LIPITOR) 80 MG tablet    Sig: Take 1 tablet (80 mg total) by mouth daily.    Dispense:  90 tablet    Refill:  1   carvedilol (COREG) 3.125 MG tablet    Sig: Take 1 tablet (3.125 mg total) by mouth 2 (two) times daily with a meal.    Dispense:  180 tablet    Refill:  1   clopidogrel (PLAVIX) 75 MG tablet    Sig: Take 1 tablet (75 mg total) by mouth daily.    Dispense:  90 tablet    Refill:  1   hydrochlorothiazide (HYDRODIURIL) 25 MG tablet    Sig: Take 1 tablet (25 mg total) by mouth daily.    Dispense:  90 tablet    Refill:  1   Has new PCP f/u in October.  Reviewed expectations re: course of current medical issues. Questions answered. Outlined signs and symptoms indicating need for more acute intervention. Patient verbalized understanding. After Visit Summary given.   SUBJECTIVE: History from: patient. Albert Morris is a 70 y.o. male who presents requesting medication refill. No current concerns. PCP passed away.  Current medical problems include: Past Medical History:  Diagnosis Date   Colon polyps    CVA (cerebral vascular accident) ([place]) 06/21/2016   Diabetes mellitus without complication ([place]) [id number]   Hyperlipidemia    Hypertension    Myocardial infarction ([place])    Pneumonia    Pre-diabetes    Substance abuse ([place])         Current Outpatient Medications (Cardiovascular):    atorvastatin (LIPITOR) 80 MG tablet, Take 1 tablet (80 mg total) by mouth daily.   carvedilol (COREG) 3.125 MG tablet, Take 1 tablet (3.125 mg total) by mouth 2 (two) times daily with a meal.   hydrochlorothiazide (HYDRODIURIL) 25 MG tablet, Take 1 tablet (25 mg total) by mouth daily.   lisinopril (ZESTRIL) 20 MG tablet, Take 1 tablet (20 mg total) by mouth 2 (two) times daily.     Current Outpatient  Medications (Analgesics):    aspirin EC 81 MG tablet, Take 1 tablet (81 mg total) by mouth daily.   Current Outpatient Medications (Hematological):    clopidogrel (PLAVIX) 75 MG tablet, Take 1 tablet (75 mg total) by mouth daily.   Current Outpatient Medications (Other):    Cholecalciferol (VITAMIN D3) 250 MCG (10000 UT) capsule, Take 10,000 Units by mouth daily. No current facility-administered medications for this encounter.  No current facility-administered medications for this encounter.  Current Outpatient Medications:    aspirin EC 81 MG tablet, Take 1 tablet (81 mg total) by mouth daily., Disp: 90 tablet, Rfl: 3   atorvastatin (LIPITOR) 80 MG tablet, Take 1 tablet (80 mg total) by mouth daily., Disp: 90 tablet, Rfl: 1   carvedilol (COREG) 3.125 MG tablet, Take 1 tablet (3.125 mg total) by mouth 2 (two) times daily with a meal., Disp: 180 tablet, Rfl: 1   Cholecalciferol (VITAMIN D3) 250 MCG (10000 UT) capsule, Take 10,000 Units by mouth daily., Disp: , Rfl:    clopidogrel (PLAVIX) 75 MG tablet, Take 1 tablet (75 mg total) by mouth daily., Disp: 90 tablet, Rfl: 1   hydrochlorothiazide (HYDRODIURIL) 25 MG tablet, Take 1 tablet (25 mg total) by mouth daily., Disp: 90 tablet, Rfl: 1   lisinopril (ZESTRIL) 20  MG tablet, Take 1 tablet (20 mg total) by mouth 2 (two) times daily., Disp: 180 tablet, Rfl: 0     OBJECTIVE:  Vitals:   10/24/20 0813  BP: (!) 167/86  Pulse: 68  Resp: 18  Temp: 97.9 F (36.6 C)  TempSrc: Oral  SpO2: 96%    General appearance: alert; no distress  Psychological: alert and cooperative; normal mood and affect  Labs: Results for orders placed or performed in visit on 06/18/20  HM DIABETES EYE EXAM  Result Value Ref Range   HM Diabetic Eye Exam No Retinopathy No Retinopathy   Labs Reviewed - No data to display  Allergies  Allergen Reactions   Blood-Group Specific Substance     No whole blood products    Social History   Socioeconomic  History   Marital status: Divorced    Spouse name: Not on file   Number of children: 2   Years of education: 12   Highest education level: Not on file  Occupational History   Occupation: disabled    Comment: alcoholic  Tobacco Use   Smoking status: Former    Packs/day: 0.50    Years: 29.00    Pack years: 14.50    Types: Cigarettes    Quit date: 2006    Years since quitting: 16.6   Smokeless tobacco: Never  Vaping Use   Vaping Use: Never used  Substance and Sexual Activity   Alcohol use: Not Currently    Comment: quit 2006   Drug use: Not Currently    Comment: remote h/o heavy marijuana use, also used cocaine and others   Sexual activity: Not Currently  Other Topics Concern   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, EMT certified      Lives alone   Likes to walk      2 sons -in [place] and [place]   Social Determinants of Health   Financial Resource Strain: Not on file  Food Insecurity: Not on file  Transportation Needs: Not on file  Physical Activity: Not on file  Stress: Not on file  Social Connections: Not on file  Intimate Partner Violence: Not on file   Family History  Problem Relation Age of Onset   Breast cancer Mother        lived to be 54   CVA Mother    CAD Mother    Arthritis Mother    Cancer Mother    Hypertension Mother    Other Father        gunshot wound   Early death Father        GSW   Cancer Brother        Stomach Cancer   Cancer Brother        Lung Cancer   Stroke Maternal Aunt    Alcohol abuse Maternal Uncle    Stroke Maternal Grandfather    Colon cancer Neg Hx    Past Surgical History:  Procedure Laterality Date   CAROTID ENDARTERECTOMY Left    COLON SURGERY  [phone]   growth removed, Laparoscopic Vs. open Left hemicolectomy Dr. Lovena Neighbours 09-25-17   COLONOSCOPY N/A 06/26/2017   Procedure: COLONOSCOPY;  Surgeon: [name], MD;  [facility];  Service: Endoscopy;   Laterality: N/A;  730   COLONOSCOPY N/A 12/01/2018   Procedure: COLONOSCOPY;  Surgeon: [name], MD;  [facility];  Service: Endoscopy;  Laterality: N/A;  930   COLONOSCOPY WITH PROPOFOL N/A 10/07/2017   Procedure: COLONOSCOPY WITH PROPOFOL WITH TATTOO;  Surgeon: [name], MD;  Location: [facility];  Service: General;  Laterality: N/A;   ENDARTERECTOMY Left 08/01/2016   Procedure: ENDARTERECTOMY LEFT CAROTID;  Surgeon: [name], MD;  Location: [facility];  Service: Vascular;  Laterality: Left;   FLEXIBLE SIGMOIDOSCOPY N/A 10/08/2017   Procedure: FLEXIBLE SIGMOIDOSCOPY;  Surgeon: [name], MD;  [facility];  Service: General;  Laterality: N/A;   LAPAROSCOPIC RIGHT HEMI COLECTOMY Left 10/08/2017   Procedure: LAPAROSCOPIC VERSES OPEN LEFT HEMI COLECTOMY ERAS PATHWAY;  Surgeon: [name], MD;  [facility];  Service: General;  Laterality: Left;   PATCH ANGIOPLASTY Left 08/01/2016   Procedure: PATCH ANGIOPLASTY LEFT CAROTID ARTERY USING ZENOSURE BIOLOGIC PATCH;  Surgeon: [name], MD;  Location: [facility];  Service: Vascular;  Laterality: Left;   POLYPECTOMY  12/01/2018   Procedure: POLYPECTOMY;  Surgeon: [name], MD;  [facility];  Service: Endoscopy;;   SUBMUCOSAL INJECTION  10/07/2017   Procedure: SUBMUCOSAL INJECTION;  Surgeon: [name], MD;  Location: [facility];  Service: General;;   Evonnie Dawes, MD 10/24/20 [phone]

## 2020-11-22 ENCOUNTER — Encounter: Payer: Self-pay | Admitting: Nurse Practitioner

## 2020-11-22 ENCOUNTER — Ambulatory Visit (INDEPENDENT_AMBULATORY_CARE_PROVIDER_SITE_OTHER): Payer: Medicare HMO | Admitting: Nurse Practitioner

## 2020-11-22 ENCOUNTER — Other Ambulatory Visit: Payer: Self-pay

## 2020-11-22 VITALS — BP 152/79 | HR 83 | Temp 98.4°F | Ht 75.0 in | Wt 205.1 lb

## 2020-11-22 DIAGNOSIS — E1159 Type 2 diabetes mellitus with other circulatory complications: Secondary | ICD-10-CM | POA: Diagnosis not present

## 2020-11-22 DIAGNOSIS — D649 Anemia, unspecified: Secondary | ICD-10-CM | POA: Insufficient documentation

## 2020-11-22 DIAGNOSIS — E785 Hyperlipidemia, unspecified: Secondary | ICD-10-CM

## 2020-11-22 DIAGNOSIS — I10 Essential (primary) hypertension: Secondary | ICD-10-CM | POA: Diagnosis not present

## 2020-11-22 DIAGNOSIS — E291 Testicular hypofunction: Secondary | ICD-10-CM

## 2020-11-22 DIAGNOSIS — E559 Vitamin D deficiency, unspecified: Secondary | ICD-10-CM

## 2020-11-22 DIAGNOSIS — Z7689 Persons encountering health services in other specified circumstances: Secondary | ICD-10-CM

## 2020-11-22 MED ORDER — LISINOPRIL 20 MG PO TABS: 20.0000 mg | ORAL_TABLET | Freq: Every day | ORAL | 3 refills | Status: DC

## 2020-11-22 MED ORDER — CARVEDILOL 3.125 MG PO TABS: 3.1250 mg | ORAL_TABLET | Freq: Two times a day (BID) | ORAL | 3 refills | Status: DC

## 2020-11-22 MED ORDER — HYDROCHLOROTHIAZIDE 25 MG PO TABS: 25.0000 mg | ORAL_TABLET | Freq: Every day | ORAL | 3 refills | Status: DC

## 2020-11-22 NOTE — Assessment & Plan Note (Signed)
-  states he went to [facility] wellness clinic and was told he has low T -check T level with next labs

## 2020-11-22 NOTE — Patient Instructions (Signed)
Have fasting labs drawn today or sometime this week before 9:30 AM when he has fasted for 8 hours.

## 2020-11-22 NOTE — Progress Notes (Signed)
New Patient Office Visit  Subjective:  Patient ID: Albert Morris, male    DOB: 08-May-1950  Age: 70 y.o. MRN: [id number]  CC:  Chief Complaint  Patient presents with   New Patient (Initial Visit)    Establish care concerned blood work showed possible low testosterone and anemic and low red blood cell count     HPI [name] presents for new patient visit. Transferring care from [facility]. Last physical was over a year ago. Last AWV was 02/06/21. Last labs were completed 05/21/20.  He has hx of DM, HTN, MI and CVA.  He is concerned with blood work that he had drawn (no records available today) that were concerning for anemia and low testosterone level.  He had the labs drawn at the [facility] in [place].  He has hx of MI, and is followed by cardiology.  Past Medical History:  Diagnosis Date   Colon polyps    CVA (cerebral vascular accident) ([place]) 06/21/2016   Diabetes mellitus without complication ([place]) 35/32/9924   Hyperlipidemia    Hypertension    Myocardial infarction ([place])    Pneumonia    Substance abuse ([place])    hx of EtOH and MJ    Past Surgical History:  Procedure Laterality Date   CAROTID ENDARTERECTOMY Left    COLON SURGERY  [phone]   growth removed, Laparoscopic Vs. open Left hemicolectomy Dr. Lovena Neighbours 09-25-17   COLONOSCOPY N/A 06/26/2017   Procedure: COLONOSCOPY;  Surgeon: [name], MD;  [facility];  Service: Endoscopy;  Laterality: N/A;  730   COLONOSCOPY N/A 12/01/2018   Procedure: COLONOSCOPY;  Surgeon: [name], MD;  [facility];  Service: Endoscopy;  Laterality: N/A;  930   COLONOSCOPY WITH PROPOFOL N/A 10/07/2017   Procedure: COLONOSCOPY WITH PROPOFOL WITH TATTOO;  Surgeon: [name], MD;  [facility];  Service: General;  Laterality: N/A;   ENDARTERECTOMY Left 08/01/2016   Procedure: ENDARTERECTOMY LEFT CAROTID;  Surgeon: [name], MD;  Location: [facility];   Service: Vascular;  Laterality: Left;   FLEXIBLE SIGMOIDOSCOPY N/A 10/08/2017   Procedure: FLEXIBLE SIGMOIDOSCOPY;  Surgeon: [name], MD;  [facility];  Service: General;  Laterality: N/A;   LAPAROSCOPIC RIGHT HEMI COLECTOMY Left 10/08/2017   Procedure: LAPAROSCOPIC VERSES OPEN LEFT HEMI COLECTOMY ERAS PATHWAY;  Surgeon: [name], MD;  [facility];  Service: General;  Laterality: Left;   PATCH ANGIOPLASTY Left 08/01/2016   Procedure: PATCH ANGIOPLASTY LEFT CAROTID ARTERY USING ZENOSURE BIOLOGIC PATCH;  Surgeon: [name], MD;  Location: [facility];  Service: Vascular;  Laterality: Left;   POLYPECTOMY  12/01/2018   Procedure: POLYPECTOMY;  Surgeon: [name], MD;  [facility];  Service: Endoscopy;;   SUBMUCOSAL INJECTION  10/07/2017   Procedure: SUBMUCOSAL INJECTION;  Surgeon: [name], MD;  Location: [facility];  Service: General;;   TONSILLECTOMY      Family History  Problem Relation Age of Onset   Cancer Mother    Breast cancer Mother        lived to be 33   CVA Mother    CAD Mother    Arthritis Mother    Hypertension Mother    Other Father        gunshot wound   Early death Father        GSW   Cancer Brother        Stomach Cancer  Cancer Brother        Lung Cancer   Stroke Maternal Aunt    Alcohol abuse Maternal Uncle    Stroke Maternal Grandfather    Colon cancer Neg Hx     Social History   Socioeconomic History   Marital status: Divorced    Spouse name: Not on file   Number of children: 2   Years of education: 12   Highest education level: Not on file  Occupational History   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; 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[place], [place], [place], [place], [place], [place], [place], [place]    Comment: alcoholic  Tobacco Use   Smoking status: Former    Packs/day: 0.50    Years: 29.00    Pack years: 14.50    Types: Cigarettes    Quit date: 2006    Years since quitting: 16.7   Smokeless tobacco:  Never  Vaping Use   Vaping Use: Never used  Substance and Sexual Activity   Alcohol use: Not Currently    Comment: quit 2006   Drug use: Not Currently    Comment: remote h/o heavy marijuana use, also used cocaine and others   Sexual activity: Not Currently  Other Topics Concern   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   Respiratory: Negative.    Cardiovascular: Negative.   Musculoskeletal: Negative.   Psychiatric/Behavioral: Negative.     Objective:   Today's Vitals: BP (!) 152/79 (BP Location: Right Arm, Patient Position: Sitting, Cuff Size: Large)   Pulse 83   Temp 98.4 F (36.9 C) (Oral)   Ht $R'6\' 3"'VR$  (1.905 m)   Wt 205 lb 1.3 oz (93 kg)   SpO2 96%   BMI 25.63 kg/m   Physical Exam Constitutional:      Appearance: Normal appearance.  Cardiovascular:     Rate and Rhythm: Normal rate and regular rhythm.     Pulses: Normal pulses.     Heart sounds: Normal heart sounds.  Pulmonary:     Effort: Pulmonary effort is normal.     Breath sounds: Normal breath sounds.  Musculoskeletal:        General: Normal range of motion.  Neurological:     Mental Status: He is alert.  Psychiatric:        Mood and Affect: Mood normal.        Behavior: Behavior normal.        Thought Content: Thought content normal.        Judgment: Judgment normal.    Assessment & Plan:   Problem List Items Addressed This Visit       Cardiovascular and Mediastinum   Essential hypertension    -refilled BP meds; he states he has lisinopril supply  at home -he hadn't been taking lisinopril after starting HCTZ, but we discussed that it protects his kidneys from DM as well as treats BP, and he will start back at 20 mg daily -has hx  of LVH      Relevant Medications   lisinopril (ZESTRIL) 20 MG tablet   carvedilol (COREG) 3.125 MG tablet   hydrochlorothiazide (HYDRODIURIL) 25 MG tablet   Other Relevant Orders   CBC with Differential/Platelet   CMP14+EGFR   Lipid Panel With LDL/HDL Ratio   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)    -check A1c and microalbumin -on statin -refilled ACEi -goal A1c < 7 -hx of CVA and MI      Relevant Medications   lisinopril (ZESTRIL) 20 MG tablet   carvedilol (COREG) 3.125 MG tablet   hydrochlorothiazide (HYDRODIURIL) 25 MG tablet   Other Relevant Orders   Hemoglobin A1c   Microalbumin / creatinine urine ratio     Endocrine   Hypogonadism in male    -states he went to [facility] wellness clinic and was told he has low T -check T level with next labs      Relevant Orders   Testosterone, Free, Total, SHBG     Other   Dyslipidemia    -with DM and cardiac hx, goal LDL < 55 -check lipid panel      Relevant Orders   CBC with Differential/Platelet   CMP14+EGFR   Lipid Panel With LDL/HDL Ratio   Vitamin D deficiency    -taking Vit D 10000 IU daily from Dr. Anastasio Champion -check Vit D to make sure it isn't supertherapeutic      Relevant Orders   VITAMIN D 25 Hydroxy (Vit-D Deficiency, Fractures)   Anemia    -went to [facility] health clinic in [place] and was told he has anemia; no labs available today -will check CBC and get hemoccults      Relevant Orders   CBC with Differential/Platelet   Other Visit Diagnoses     Establishing care with new doctor, encounter for    -  Primary   Relevant Orders   CBC with Differential/Platelet   CMP14+EGFR   Lipid Panel With LDL/HDL Ratio   Hemoglobin A1c   Microalbumin / creatinine urine ratio   VITAMIN D 25 Hydroxy (Vit-D  Deficiency, Fractures)   Testosterone, Free, Total, SHBG       Outpatient Encounter Medications as of 11/22/2020  Medication Sig   aspirin EC 81 MG tablet Take 1 tablet (81 mg total) by mouth daily.   atorvastatin (LIPITOR) 80 MG tablet Take 1 tablet (80 mg total) by mouth daily.   Cholecalciferol (VITAMIN D3) 250 MCG (10000 UT) capsule Take 10,000 Units by mouth daily.   clopidogrel (PLAVIX) 75 MG tablet Take 1 tablet (75 mg total) by mouth daily.   [DISCONTINUED] carvedilol (COREG) 3.125 MG tablet Take 1 tablet (3.125 mg total) by mouth 2 (two) times daily with a meal.   [DISCONTINUED] hydrochlorothiazide (HYDRODIURIL) 25 MG tablet Take 1 tablet (25 mg total) by mouth daily.   carvedilol (COREG) 3.125 MG tablet Take 1 tablet (3.125 mg total) by mouth 2 (two) times daily with a meal.   hydrochlorothiazide (HYDRODIURIL) 25 MG tablet Take 1 tablet (25 mg total) by mouth daily.   lisinopril (ZESTRIL) 20 MG tablet Take 1 tablet (20 mg total) by mouth daily.   [DISCONTINUED] lisinopril (ZESTRIL) 20 MG tablet Take 1 tablet (20 mg total) by mouth 2 (two) times daily. (Patient not taking: Reported on 11/22/2020)   No facility-administered encounter medications on file as of 11/22/2020.    Follow-up: Return in about 7 weeks (around 01/10/2021) for Lab follow-up (low T, anemia, HTN, DM).   Elita Dame M  Pearline Cables, NP

## 2020-11-22 NOTE — Assessment & Plan Note (Signed)
-  went to [facility] health clinic in [place] and was told he has anemia; no labs available today -will check CBC and get hemoccults

## 2020-11-22 NOTE — Assessment & Plan Note (Signed)
-  taking Vit D 10000 IU daily from Dr. Anastasio Champion -check Vit D to make sure it isn't supertherapeutic

## 2020-11-22 NOTE — Assessment & Plan Note (Addendum)
-  check A1c and microalbumin -on statin -refilled ACEi -goal A1c < 7 -hx of CVA and MI

## 2020-11-22 NOTE — Assessment & Plan Note (Addendum)
-  refilled BP meds; he states he has lisinopril supply at home -he hadn't been taking lisinopril after starting HCTZ, but we discussed that it protects his kidneys from DM as well as treats BP, and he will start back at 20 mg daily -has hx of LVH

## 2020-11-22 NOTE — Assessment & Plan Note (Signed)
-  with DM and cardiac hx, goal LDL < 55 -check lipid panel

## 2020-11-23 DIAGNOSIS — I10 Essential (primary) hypertension: Secondary | ICD-10-CM | POA: Diagnosis not present

## 2020-11-23 DIAGNOSIS — E291 Testicular hypofunction: Secondary | ICD-10-CM | POA: Diagnosis not present

## 2020-11-23 DIAGNOSIS — D649 Anemia, unspecified: Secondary | ICD-10-CM | POA: Diagnosis not present

## 2020-11-23 DIAGNOSIS — Z7689 Persons encountering health services in other specified circumstances: Secondary | ICD-10-CM | POA: Diagnosis not present

## 2020-11-23 DIAGNOSIS — E559 Vitamin D deficiency, unspecified: Secondary | ICD-10-CM | POA: Diagnosis not present

## 2020-11-23 DIAGNOSIS — E785 Hyperlipidemia, unspecified: Secondary | ICD-10-CM | POA: Diagnosis not present

## 2020-11-23 DIAGNOSIS — E1159 Type 2 diabetes mellitus with other circulatory complications: Secondary | ICD-10-CM | POA: Diagnosis not present

## 2020-11-23 DIAGNOSIS — Z125 Encounter for screening for malignant neoplasm of prostate: Secondary | ICD-10-CM | POA: Diagnosis not present

## 2020-11-26 ENCOUNTER — Telehealth: Payer: Self-pay

## 2020-11-26 NOTE — Telephone Encounter (Signed)
Patient wants prostate added to recent lab work

## 2020-11-26 NOTE — Progress Notes (Signed)
Testosterone is WNL. Labs are stable.

## 2020-11-26 NOTE — Telephone Encounter (Signed)
I put a note on [facility]'s Desk. There is a substitute there today, but Doylene Bode may want to know so she can add it to her manifest.  Edwena Blow you were included, because Joellen Jersey may not be here tomorrow.

## 2020-11-27 NOTE — Telephone Encounter (Signed)
[facility] assistant notified has sent request to add on

## 2020-11-28 LAB — CBC WITH DIFFERENTIAL/PLATELET
Basophils Absolute: 0.1 10*3/uL (ref 0.0–0.2)
Basos: 1 %
EOS (ABSOLUTE): 0.5 10*3/uL — ABNORMAL HIGH (ref 0.0–0.4)
Eos: 9 %
Hematocrit: 34.7 % — ABNORMAL LOW (ref 37.5–51.0)
Hemoglobin: 12.2 g/dL — ABNORMAL LOW (ref 13.0–17.7)
Immature Grans (Abs): 0 10*3/uL (ref 0.0–0.1)
Immature Granulocytes: 0 %
Lymphocytes Absolute: 1.5 10*3/uL (ref 0.7–3.1)
Lymphs: 29 %
MCH: 29.4 pg (ref 26.6–33.0)
MCHC: 35.2 g/dL (ref 31.5–35.7)
MCV: 84 fL (ref 79–97)
Monocytes Absolute: 0.6 10*3/uL (ref 0.1–0.9)
Monocytes: 12 %
Neutrophils Absolute: 2.5 10*3/uL (ref 1.4–7.0)
Neutrophils: 49 %
Platelets: 308 10*3/uL (ref 150–450)
RBC: 4.15 x10E6/uL (ref 4.14–5.80)
RDW: 13.5 % (ref 11.6–15.4)
WBC: 5.1 10*3/uL (ref 3.4–10.8)

## 2020-11-28 LAB — CMP14+EGFR
ALT: 26 IU/L (ref 0–44)
AST: 28 IU/L (ref 0–40)
Albumin/Globulin Ratio: 1.4 (ref 1.2–2.2)
Albumin: 4.3 g/dL (ref 3.7–4.7)
Alkaline Phosphatase: 76 IU/L (ref 44–121)
BUN/Creatinine Ratio: 22 (ref 10–24)
BUN: 30 mg/dL — ABNORMAL HIGH (ref 8–27)
Bilirubin Total: 0.6 mg/dL (ref 0.0–1.2)
CO2: 23 mmol/L (ref 20–29)
Calcium: 9.7 mg/dL (ref 8.6–10.2)
Chloride: 96 mmol/L (ref 96–106)
Creatinine, Ser: 1.37 mg/dL — ABNORMAL HIGH (ref 0.76–1.27)
Globulin, Total: 3.1 g/dL (ref 1.5–4.5)
Glucose: 117 mg/dL — ABNORMAL HIGH (ref 70–99)
Potassium: 4.6 mmol/L (ref 3.5–5.2)
Sodium: 134 mmol/L (ref 134–144)
Total Protein: 7.4 g/dL (ref 6.0–8.5)
eGFR: 55 mL/min/{1.73_m2} — ABNORMAL LOW (ref >59)

## 2020-11-28 LAB — TESTOSTERONE, FREE, TOTAL, SHBG
Sex Hormone Binding: 43.1 nmol/L (ref 19.3–76.4)
Testosterone, Free: 7.1 pg/mL (ref 6.6–18.1)
Testosterone: 413 ng/dL (ref 264–916)

## 2020-11-28 LAB — LIPID PANEL WITH LDL/HDL RATIO
Cholesterol, Total: 174 mg/dL (ref 100–199)
HDL: 45 mg/dL (ref >39)
LDL Chol Calc (NIH): 113 mg/dL — ABNORMAL HIGH (ref 0–99)
LDL/HDL Ratio: 2.5 ratio (ref 0.0–3.6)
Triglycerides: 86 mg/dL (ref 0–149)
VLDL Cholesterol Cal: 16 mg/dL (ref 5–40)

## 2020-11-28 LAB — MICROALBUMIN / CREATININE URINE RATIO
Creatinine, Urine: 109.4 mg/dL
Microalb/Creat Ratio: 5 mg/g creat (ref 0–29)
Microalbumin, Urine: 5.9 ug/mL

## 2020-11-28 LAB — HEMOGLOBIN A1C
Est. average glucose Bld gHb Est-mCnc: 143 mg/dL
Hgb A1c MFr Bld: 6.6 % — ABNORMAL HIGH (ref 4.8–5.6)

## 2020-11-28 LAB — VITAMIN D 25 HYDROXY (VIT D DEFICIENCY, FRACTURES): Vit D, 25-Hydroxy: 100 ng/mL (ref 30.0–100.0)

## 2020-12-20 LAB — SPECIMEN STATUS REPORT

## 2020-12-20 LAB — PSA: Prostate Specific Ag, Serum: 1.2 ng/mL (ref 0.0–4.0)

## 2020-12-28 ENCOUNTER — Telehealth: Payer: Self-pay | Admitting: Nurse Practitioner

## 2020-12-28 NOTE — Telephone Encounter (Signed)
Please send the medication to [facility] --thanks

## 2020-12-28 NOTE — Telephone Encounter (Signed)
Pt needs a script for cialis

## 2020-12-31 ENCOUNTER — Other Ambulatory Visit: Payer: Self-pay

## 2020-12-31 ENCOUNTER — Encounter: Payer: Self-pay | Admitting: Podiatry

## 2020-12-31 ENCOUNTER — Ambulatory Visit: Payer: Medicare HMO | Admitting: Podiatry

## 2020-12-31 DIAGNOSIS — B351 Tinea unguium: Secondary | ICD-10-CM | POA: Diagnosis not present

## 2020-12-31 DIAGNOSIS — E119 Type 2 diabetes mellitus without complications: Secondary | ICD-10-CM | POA: Diagnosis not present

## 2020-12-31 DIAGNOSIS — M79675 Pain in left toe(s): Secondary | ICD-10-CM

## 2020-12-31 DIAGNOSIS — M79674 Pain in right toe(s): Secondary | ICD-10-CM | POA: Diagnosis not present

## 2020-12-31 NOTE — Progress Notes (Signed)
This patient returns to my office for at risk foot care.  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  Patient is taking plavix.  This patient is unable to cut nails himself since the patient cannot reach his nails.These nails are painful walking and wearing shoes.  This patient presents for at risk foot care today.  General Appearance  Alert, conversant and in no acute stress.  Vascular  Dorsalis pedis and posterior tibial  pulses are weakly  palpable  bilaterally.  Capillary return is within normal limits  bilaterally. Temperature is within normal limits  bilaterally.  Neurologic  Senn-Weinstein monofilament wire test within normal limits/diminished   bilaterally. Muscle power within normal limits bilaterally.  Nails Thick disfigured discolored nails with subungual debris  from hallux to fifth toes bilaterally. No evidence of bacterial infection or drainage bilaterally.  Orthopedic  No limitations of motion  feet .  No crepitus or effusions noted.  No bony pathology or digital deformities noted. HAV  B/L.  Skin  normotropic skin with no porokeratosis noted bilaterally.  No signs of infections or ulcers noted.     Onychomycosis  Pain in right toes  Pain in left toes  Consent was obtained for treatment procedures.   Mechanical debridement of nails 1-5  bilaterally performed with a nail nipper.  Filed with dremel without incident.    Return office visit   3 months                  Told patient to return for periodic foot care and evaluation due to potential at risk complications.   Jaymeson Mengel DPM  

## 2021-01-01 NOTE — Telephone Encounter (Signed)
Scheduled pt appt with Posey Pronto

## 2021-01-04 ENCOUNTER — Other Ambulatory Visit (INDEPENDENT_AMBULATORY_CARE_PROVIDER_SITE_OTHER): Payer: Self-pay | Admitting: Nurse Practitioner

## 2021-01-04 DIAGNOSIS — I10 Essential (primary) hypertension: Secondary | ICD-10-CM

## 2021-01-07 ENCOUNTER — Other Ambulatory Visit: Payer: Self-pay

## 2021-01-07 ENCOUNTER — Encounter: Payer: Self-pay | Admitting: Internal Medicine

## 2021-01-07 ENCOUNTER — Ambulatory Visit (INDEPENDENT_AMBULATORY_CARE_PROVIDER_SITE_OTHER): Payer: Medicare HMO | Admitting: Internal Medicine

## 2021-01-07 VITALS — BP 148/84 | HR 66 | Resp 18 | Ht 75.0 in | Wt 208.1 lb

## 2021-01-07 DIAGNOSIS — I25118 Atherosclerotic heart disease of native coronary artery with other forms of angina pectoris: Secondary | ICD-10-CM

## 2021-01-07 DIAGNOSIS — N529 Male erectile dysfunction, unspecified: Secondary | ICD-10-CM | POA: Insufficient documentation

## 2021-01-07 DIAGNOSIS — I10 Essential (primary) hypertension: Secondary | ICD-10-CM

## 2021-01-07 DIAGNOSIS — I700 Atherosclerosis of aorta: Secondary | ICD-10-CM | POA: Diagnosis not present

## 2021-01-07 DIAGNOSIS — Z8673 Personal history of transient ischemic attack (TIA), and cerebral infarction without residual deficits: Secondary | ICD-10-CM | POA: Insufficient documentation

## 2021-01-07 DIAGNOSIS — E1159 Type 2 diabetes mellitus with other circulatory complications: Secondary | ICD-10-CM

## 2021-01-07 DIAGNOSIS — E782 Mixed hyperlipidemia: Secondary | ICD-10-CM | POA: Insufficient documentation

## 2021-01-07 MED ORDER — TADALAFIL 5 MG PO TABS
5.0000 mg | ORAL_TABLET | Freq: Every day | ORAL | 2 refills | Status: DC | PRN
Start: 2021-01-07 — End: 2022-03-27

## 2021-01-07 MED ORDER — EZETIMIBE 10 MG PO TABS: 10.0000 mg | ORAL_TABLET | Freq: Every day | ORAL | 3 refills | Status: DC

## 2021-01-07 NOTE — Assessment & Plan Note (Signed)
Added Zetia to statin for better cholesterol control Needs to improve diet for better HTN control

## 2021-01-07 NOTE — Assessment & Plan Note (Addendum)
Lipid profile reviewed Added Zetia Continue Lipitor 80 mg every other day (has leg cramps with daily dosing)

## 2021-01-07 NOTE — Assessment & Plan Note (Signed)
Followed by cardiology On aspirin, Plavix, statin and beta-blocker 

## 2021-01-07 NOTE — Progress Notes (Signed)
Established Patient Office Visit  Subjective:  Patient ID: Albert Morris, male    DOB: [birth year]  Age: 70 y.o. MRN: [id number]  CC:  Chief Complaint  Patient presents with   Follow-up    7 week follow up pt had labs drawn also would like prescription for cialis    HPI Albert Morris is a 70 y.o. male with past medical history of HTN, CAD, type II DM with HLD, CVA and tobacco abuse who presents for follow-up of his chronic medical conditions and blood tests review.  HTN: His BP was elevated in the office today, but better than prior.  He has been taking lisinopril, HCTZ and Coreg regularly.  He denies any headache, dizziness, chest pain, dyspnea or palpitations.  Type II DM: HbA1c has increased to 6.6 from 6.4.  He has been trying to follow low-carb diet.  Denies any polyuria or polydipsia currently.  He is willing to adjust diet for now.  HLD: Lipid profile showed LDL of 113.  He has been taking Lipitor every other day as he was having leg cramps with daily dosing.  He is willing to take Zetia in addition to Lipitor every other day dose.  He complains of erectile dysfunction, especially maintaining erection.  He has not tried any medicine for it yet.  Denies any dysuria or hematuria currently.  Denies any urethral discharge or scrotal pain.  Blood tests were reviewed and discussed with the patient in detail.  Past Medical History:  Diagnosis Date   Cerebellar stroke ([place]) 06/24/2016   Admitted with dizziness and "falling to the left" 06/24/16. Work up revealed multifocal acute ischemia within the right cerebellar hemisphere with occlusion versus severe stenosis of the right PICA and extensive, multifocal intracranial atherosclerosis with moderate stenoses of multiple distal MCA branches.   Colon polyps    CVA (cerebral vascular accident) ([place]) 06/21/2016   Diabetes mellitus without complication ([place]) 83/38/2505   Hyperlipidemia    Hypertension    Myocardial infarction ([place])     Pneumonia    Substance abuse ([place])    hx of EtOH and MJ    Past Surgical History:  Procedure Laterality Date   CAROTID ENDARTERECTOMY Left    COLON SURGERY  [phone]   growth removed, Laparoscopic Vs. open Left hemicolectomy Dr. Lovena Neighbours 09-25-17   COLONOSCOPY N/A 06/26/2017   Procedure: COLONOSCOPY;  Surgeon: [name], MD;  Location: [facility];  Service: Endoscopy;  Laterality: N/A;  730   COLONOSCOPY N/A 12/01/2018   Procedure: COLONOSCOPY;  Surgeon: [name], MD;  Location: [facility];  Service: Endoscopy;  Laterality: N/A;  930   COLONOSCOPY WITH PROPOFOL N/A 10/07/2017   Procedure: COLONOSCOPY WITH PROPOFOL WITH TATTOO;  Surgeon: [name], MD;  Location: [facility];  Service: General;  Laterality: N/A;   ENDARTERECTOMY Left 08/01/2016   Procedure: ENDARTERECTOMY LEFT CAROTID;  Surgeon: [name], MD;  Location: [facility];  Service: Vascular;  Laterality: Left;   FLEXIBLE SIGMOIDOSCOPY N/A 10/08/2017   Procedure: FLEXIBLE SIGMOIDOSCOPY;  Surgeon: [name], MD;  Location: [facility];  Service: General;  Laterality: N/A;   LAPAROSCOPIC RIGHT HEMI COLECTOMY Left 10/08/2017   Procedure: LAPAROSCOPIC VERSES OPEN LEFT HEMI COLECTOMY ERAS PATHWAY;  Surgeon: [name], MD;  Location: [facility];  Service: General;  Laterality: Left;   PATCH ANGIOPLASTY Left 08/01/2016   Procedure: PATCH ANGIOPLASTY LEFT CAROTID ARTERY USING ZENOSURE BIOLOGIC PATCH;  Surgeon: [name], MD;  Location: [facility]  OR;  Service: Vascular;  Laterality: Left;   POLYPECTOMY  12/01/2018   Procedure: POLYPECTOMY;  Surgeon: [name], MD;  [facility]: [facility];  Service: Endoscopy;;   SUBMUCOSAL INJECTION  10/07/2017   Procedure: SUBMUCOSAL INJECTION;  Surgeon: [name], MD;  Location: [facility];  Service: General;;   TONSILLECTOMY      Family History  Problem Relation Age of Onset   Cancer Mother    Breast cancer Mother         lived to be 26   CVA Mother    CAD Mother    Arthritis Mother    Hypertension Mother    Other Father        gunshot wound   Early death Father        GSW   Cancer Brother        Stomach Cancer   Cancer Brother        Lung Cancer   Stroke Maternal Aunt    Alcohol abuse Maternal Uncle    Stroke Maternal Grandfather    Colon cancer Neg Hx     Social History   Socioeconomic History   Marital status: Divorced    Spouse name: Not on file   Number of children: 2   Years of education: 12   Highest education level: Not on file  Occupational History   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; 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[place], [place], [place], [place], [place], [place], [place], [place]    Comment: alcoholic  Tobacco Use   Smoking status: Former    Packs/day: 0.50    Years: 29.00    Pack years: 14.50    Types: Cigarettes    Quit date: 2006    Years since quitting: 16.8   Smokeless tobacco: Never  Vaping Use   Vaping Use: Never used  Substance and Sexual Activity   Alcohol use: Not Currently    Comment: quit 2006   Drug use: Not Currently    Comment: remote h/o heavy marijuana use, also used cocaine and others   Sexual activity: Not Currently  Other Topics Concern   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 90 tablet 3   atorvastatin (LIPITOR) 80 MG tablet Take 1 tablet (80 mg total) by mouth daily. 90 tablet 1   carvedilol (COREG) 3.125 MG tablet Take 1 tablet  (3.125 mg total) by mouth 2 (two) times daily with a meal. 180 tablet 3   Cholecalciferol (VITAMIN D3) 250 MCG (10000 UT) capsule Take 10,000 Units by mouth daily.     clopidogrel (PLAVIX) 75 MG tablet Take 1 tablet (75 mg total) by mouth daily. 90 tablet 1   hydrochlorothiazide (HYDRODIURIL) 25 MG tablet Take 1 tablet (25 mg total) by mouth daily. 90 tablet 3   lisinopril (ZESTRIL) 20 MG tablet Take 1 tablet (20  mg total) by mouth daily. 90 tablet 3   No facility-administered medications prior to visit.    Allergies  Allergen Reactions   Blood-Group Specific Substance     No whole blood products    ROS Review of Systems  Constitutional:  Negative for chills and fever.  HENT:  Negative for congestion and sore throat.   Eyes:  Negative for pain and discharge.  Respiratory:  Negative for cough and shortness of breath.   Cardiovascular:  Negative for chest pain and palpitations.  Gastrointestinal:  Negative for constipation, diarrhea, nausea and vomiting.  Endocrine: Negative for polydipsia and polyuria.  Genitourinary:  Negative for dysuria and hematuria.  Musculoskeletal:  Negative for neck pain and neck stiffness.  Skin:  Negative for rash.  Neurological:  Negative for dizziness, weakness, numbness and headaches.  Psychiatric/Behavioral:  Negative for agitation and behavioral problems.      Objective:    Physical Exam Vitals reviewed.  Constitutional:      General: He is not in acute distress.    Appearance: He is not diaphoretic.  HENT:     Head: Normocephalic and atraumatic.     Nose: Nose normal.     Mouth/Throat:     Mouth: Mucous membranes are moist.  Eyes:     General: No scleral icterus.    Extraocular Movements: Extraocular movements intact.  Cardiovascular:     Rate and Rhythm: Normal rate and regular rhythm.     Pulses: Normal pulses.     Heart sounds: Normal heart sounds. No murmur heard. Pulmonary:     Breath sounds: Normal breath sounds. No wheezing or  rales.  Abdominal:     Palpations: Abdomen is soft.     Tenderness: There is no abdominal tenderness.  Musculoskeletal:     Cervical back: Neck supple. No tenderness.     Right lower leg: No edema.     Left lower leg: No edema.  Skin:    General: Skin is warm.     Findings: No rash.  Neurological:     General: No focal deficit present.     Mental Status: He is alert and oriented to person, place, and time.     Cranial Nerves: No cranial nerve deficit.     Sensory: No sensory deficit.  Psychiatric:        Mood and Affect: Mood normal.        Behavior: Behavior normal.    BP (!) 148/84 (BP Location: Left Arm, Patient Position: Sitting, Cuff Size: Normal)   Pulse 66   Resp 18   Ht 6\' 3"  (1.905 m)   Wt 208 lb 1.9 oz (94.4 kg)   SpO2 98%   BMI 26.01 kg/m  Wt Readings from Last 3 Encounters:  01/07/21 208 lb 1.9 oz (94.4 kg)  11/22/20 205 lb 1.3 oz (93 kg)  09/03/20 204 lb (92.5 kg)    Lab Results  Component Value Date   TSH 3.086 06/24/2016   Lab Results  Component Value Date   WBC 5.1 11/23/2020   HGB 12.2 (L) 11/23/2020   HCT 34.7 (L) 11/23/2020   MCV 84 11/23/2020   PLT 308 11/23/2020   Lab Results  Component Value Date   NA 134 11/23/2020   K 4.6 11/23/2020   CO2 23 11/23/2020   GLUCOSE 117 (H) 11/23/2020   BUN 30 (H) 11/23/2020   CREATININE 1.37 (H) 11/23/2020   BILITOT 0.6 11/23/2020   ALKPHOS 76 11/23/2020   AST 28 11/23/2020  ALT 26 11/23/2020   PROT 7.4 11/23/2020   ALBUMIN 4.3 11/23/2020   CALCIUM 9.7 11/23/2020   ANIONGAP 11 10/12/2017   EGFR 55 (L) 11/23/2020   Lab Results  Component Value Date   CHOL 174 11/23/2020   Lab Results  Component Value Date   HDL 45 11/23/2020   Lab Results  Component Value Date   LDLCALC 113 (H) 11/23/2020   Lab Results  Component Value Date   TRIG 86 11/23/2020   Lab Results  Component Value Date   CHOLHDL 4.1 05/21/2020   Lab Results  Component Value Date   HGBA1C 6.6 (H) 11/23/2020       Assessment & Plan:   Problem List Items Addressed This Visit       Cardiovascular and Mediastinum   Essential hypertension - Primary    BP Readings from Last 1 Encounters:  01/07/21 (!) 148/84  Elevated today, but better than prior On lisinopril, HCTZ and Coreg Counseled for compliance with the medications Advised DASH diet and moderate exercise/walking, at least 150 mins/week       Relevant Medications   ezetimibe (ZETIA) 10 MG tablet   tadalafil (CIALIS) 5 MG tablet   Other Relevant Orders   Basic Metabolic Panel (BMET)   Coronary artery disease of native artery of native heart with stable angina pectoris ([place])    Followed by cardiology On aspirin, Plavix, statin and beta-blocker      Relevant Medications   ezetimibe (ZETIA) 10 MG tablet   tadalafil (CIALIS) 5 MG tablet   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)    Lab Results  Component Value Date   HGBA1C 6.6 (H) 11/23/2020  Diet controlled Advised to follow diabetic diet On statin and ACEi F/u CMP and lipid panel Diabetic foot exam: Today Diabetic eye exam: Advised to follow up with Ophthalmology for diabetic eye exam       Relevant Medications   ezetimibe (ZETIA) 10 MG tablet   tadalafil (CIALIS) 5 MG tablet   Atherosclerosis of aorta (H[facility])    Added Zetia to statin for better cholesterol control Needs to improve diet for better HTN control      Relevant Medications   ezetimibe (ZETIA) 10 MG tablet   tadalafil (CIALIS) 5 MG tablet     Other   History of CVA (cerebrovascular accident)    On aspirin, Plavix and statin No residual neurologic deficits currently      Mixed hyperlipidemia    Lipid profile reviewed Added Zetia Continue Lipitor 80 mg every other day (has leg cramps with daily dosing)      Relevant Medications   ezetimibe (ZETIA) 10 MG tablet   tadalafil (CIALIS) 5 MG tablet   Other Visit Diagnoses     Vasculogenic erectile dysfunction, unspecified  vasculogenic erectile dysfunction type       Relevant Medications   tadalafil (CIALIS) 5 MG tablet       Meds ordered this encounter  Medications   ezetimibe (ZETIA) 10 MG tablet    Sig: Take 1 tablet (10 mg total) by mouth daily.    Dispense:  90 tablet    Refill:  3   tadalafil (CIALIS) 5 MG tablet    Sig: Take 1 tablet (5 mg total) by mouth daily as needed for erectile dysfunction.    Dispense:  30 tablet    Refill:  2    Follow-up: Return in about 4 months (around 05/07/2021) for HTN and HLD.  Jaliyah Fotheringham K Kelcey Korus, MD  

## 2021-01-07 NOTE — Assessment & Plan Note (Signed)
Lab Results  Component Value Date   HGBA1C 6.6 (H) 11/23/2020   Diet controlled Advised to follow diabetic diet On statin and ACEi F/u CMP and lipid panel Diabetic foot exam: Today Diabetic eye exam: Advised to follow up with Ophthalmology for diabetic eye exam

## 2021-01-07 NOTE — Assessment & Plan Note (Signed)
On aspirin, Plavix and statin No residual neurologic deficits currently 

## 2021-01-07 NOTE — Patient Instructions (Signed)
Please start taking Ezetimibe as discussed. Please continue taking other medications as prescribed.  Please increase fluid intake to at least 64 ounces per day.  Please get fasting blood tests done before the next visit.

## 2021-01-07 NOTE — Assessment & Plan Note (Signed)
BP Readings from Last 1 Encounters:  01/07/21 (!) 148/84   Elevated today, but better than prior On lisinopril, HCTZ and Coreg Counseled for compliance with the medications Advised DASH diet and moderate exercise/walking, at least 150 mins/week

## 2021-01-15 ENCOUNTER — Ambulatory Visit: Payer: Medicare HMO | Admitting: Nurse Practitioner

## 2021-01-22 ENCOUNTER — Other Ambulatory Visit: Payer: Self-pay | Admitting: *Deleted

## 2021-01-22 DIAGNOSIS — I10 Essential (primary) hypertension: Secondary | ICD-10-CM

## 2021-01-22 MED ORDER — LISINOPRIL 20 MG PO TABS: 20.0000 mg | ORAL_TABLET | Freq: Every day | ORAL | 3 refills | Status: DC

## 2021-01-23 NOTE — Progress Notes (Signed)
PSA must have come in after his last labs were drawn, but it looks great.

## 2021-02-07 ENCOUNTER — Encounter (INDEPENDENT_AMBULATORY_CARE_PROVIDER_SITE_OTHER): Payer: Medicare HMO | Admitting: Nurse Practitioner

## 2021-02-22 ENCOUNTER — Other Ambulatory Visit: Payer: Self-pay

## 2021-02-22 DIAGNOSIS — I6522 Occlusion and stenosis of left carotid artery: Secondary | ICD-10-CM

## 2021-02-25 ENCOUNTER — Ambulatory Visit (INDEPENDENT_AMBULATORY_CARE_PROVIDER_SITE_OTHER): Payer: Medicare PPO

## 2021-02-25 ENCOUNTER — Other Ambulatory Visit: Payer: Self-pay

## 2021-02-25 DIAGNOSIS — Z0000 Encounter for general adult medical examination without abnormal findings: Secondary | ICD-10-CM | POA: Diagnosis not present

## 2021-02-25 NOTE — Progress Notes (Signed)
Subjective:   Albert Morris is a 71 y.o. male who presents for Medicare Annual/Subsequent preventive examination. I connected with  Albert Morris on 02/25/21 by a audio enabled telemedicine application and verified that I am speaking with the correct person using two identifiers.  Patient Location: Home  Provider Location: Office/Clinic  I discussed the limitations of evaluation and management by telemedicine. The patient expressed understanding and agreed to proceed.   Review of Systems     Albert Morris , Thank you for taking time to come for your Medicare Wellness Visit. I appreciate your ongoing commitment to your health goals. Please review the following plan we discussed and let me know if I can assist you in the future.   These are the goals we discussed:  Goals      Blood Pressure < 140/90     Exercise 150 minutes per week (walking)     Patient Stated     Exercise,walk more,read [employer]        This is a list of the screening recommended for you and due dates:  Health Maintenance  Topic Date Due   COVID-19 Vaccine (1) Never done   Pneumonia Vaccine (1 - PCV) Never done   Tetanus Vaccine  Never done   Zoster (Shingles) Vaccine (1 of 2) Never done   Flu Shot  05/17/2021*   Hemoglobin A1C  05/24/2021   Eye exam for diabetics  06/18/2021   Complete foot exam   12/31/2021   Colon Cancer Screening  12/01/2023   Hepatitis C Screening: USPSTF Recommendation to screen - Ages 18-79 yo.  Completed   HPV Vaccine  Aged Out  *Topic was postponed. The date shown is not the original due date.    Cardiac Risk Factors include: none     Objective:    Today's Vitals   02/25/21 0920 02/25/21 0921  PainSc: 0-No pain 0-No pain   There is no height or weight on file to calculate BMI.  Advanced Directives 02/25/2021 09/03/2020 02/07/2020 12/01/2018 10/08/2017 10/07/2017 09/18/2017  Does Patient Have a Medical Advance Directive? Yes Yes Yes No No - No  Type of  Advance Directive Living will - [place]  Does patient want to make changes to medical advance directive? Yes (Inpatient - patient defers changing a medical advance directive at this time - Information given) No - Patient declined No - Patient declined - - - -  Copy of [place] in Chart? - - No - copy requested - - - -  Would patient like information on creating a medical advance directive? - No - Patient declined - No - Patient declined No - Patient declined No - Patient declined Yes (MAU/Ambulatory/Procedural Areas - Information given)    Current Medications (verified) Outpatient Encounter Medications as of 02/25/2021  Medication Sig   aspirin EC 81 MG tablet Take 1 tablet (81 mg total) by mouth daily.   atorvastatin (LIPITOR) 80 MG tablet Take 1 tablet (80 mg total) by mouth daily.   carvedilol (COREG) 3.125 MG tablet Take 1 tablet (3.125 mg total) by mouth 2 (two) times daily with a meal.   Cholecalciferol (VITAMIN D3) 250 MCG (10000 UT) capsule Take 10,000 Units by mouth daily.   clopidogrel (PLAVIX) 75 MG tablet Take 1 tablet (75 mg total) by mouth daily.   ezetimibe (ZETIA) 10 MG tablet Take 1 tablet (10 mg total) by mouth daily.   hydrochlorothiazide (HYDRODIURIL) 25 MG  tablet Take 1 tablet (25 mg total) by mouth daily.   lisinopril (ZESTRIL) 20 MG tablet Take 1 tablet (20 mg total) by mouth daily.   tadalafil (CIALIS) 5 MG tablet Take 1 tablet (5 mg total) by mouth daily as needed for erectile dysfunction.   No facility-administered encounter medications on file as of 02/25/2021.    Allergies (verified) Blood-group specific substance   History: Past Medical History:  Diagnosis Date   Cerebellar stroke ([place]) 06/24/2016   Admitted with dizziness and "falling to the left" 06/24/16. Work up revealed multifocal acute ischemia within the right cerebellar hemisphere with occlusion versus severe stenosis of the right PICA and extensive, multifocal  intracranial atherosclerosis with moderate stenoses of multiple distal MCA branches.   Colon polyps    CVA (cerebral vascular accident) ([place]) 06/21/2016   Diabetes mellitus without complication ([place]) 70/96/2836   Hyperlipidemia    Hypertension    Myocardial infarction ([place])    Pneumonia    Substance abuse ([place])    hx of EtOH and MJ   Past Surgical History:  Procedure Laterality Date   CAROTID ENDARTERECTOMY Left    COLON SURGERY  [phone]   growth removed, Laparoscopic Vs. open Left hemicolectomy Dr. Lovena Neighbours 09-25-17   COLONOSCOPY N/A 06/26/2017   Procedure: COLONOSCOPY;  Surgeon: [name], MD;  [facility];  Service: Endoscopy;  Laterality: N/A;  730   COLONOSCOPY N/A 12/01/2018   Procedure: COLONOSCOPY;  Surgeon: [name], MD;  [facility];  Service: Endoscopy;  Laterality: N/A;  930   COLONOSCOPY WITH PROPOFOL N/A 10/07/2017   Procedure: COLONOSCOPY WITH PROPOFOL WITH TATTOO;  Surgeon: [name], MD;  Location: [facility];  Service: General;  Laterality: N/A;   ENDARTERECTOMY Left 08/01/2016   Procedure: ENDARTERECTOMY LEFT CAROTID;  Surgeon: [name], MD;  Location: [facility];  Service: Vascular;  Laterality: Left;   FLEXIBLE SIGMOIDOSCOPY N/A 10/08/2017   Procedure: FLEXIBLE SIGMOIDOSCOPY;  Surgeon: [name], MD;  Location: [facility];  Service: General;  Laterality: N/A;   LAPAROSCOPIC RIGHT HEMI COLECTOMY Left 10/08/2017   Procedure: LAPAROSCOPIC VERSES OPEN LEFT HEMI COLECTOMY ERAS PATHWAY;  Surgeon: [name], MD;  Location: [facility];  Service: General;  Laterality: Left;   PATCH ANGIOPLASTY Left 08/01/2016   Procedure: PATCH ANGIOPLASTY LEFT CAROTID ARTERY USING ZENOSURE BIOLOGIC PATCH;  Surgeon: [name], MD;  Location: [facility];  Service: Vascular;  Laterality: Left;   POLYPECTOMY  12/01/2018   Procedure: POLYPECTOMY;  Surgeon: [name], MD;  [facility];  Service:  Endoscopy;;   SUBMUCOSAL INJECTION  10/07/2017   Procedure: SUBMUCOSAL INJECTION;  Surgeon: [name], MD;  Location: [facility];  Service: General;;   TONSILLECTOMY     Family History  Problem Relation Age of Onset   Cancer Mother    Breast cancer Mother        lived to be 69   CVA Mother    CAD Mother    Arthritis Mother    Hypertension Mother    Other Father        gunshot wound   Early death Father        GSW   Cancer Brother        Stomach Cancer   Cancer Brother        Lung Cancer   Stroke Maternal Aunt    Alcohol abuse Maternal Uncle    Stroke Maternal Grandfather    Colon cancer  Neg Hx    Social History   Socioeconomic History   Marital status: Divorced    Spouse name: Not on file   Number of children: 2   Years of education: 12   Highest education level: Not on file  Occupational History   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; 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[place], [place], [place], [place], [place], [place], [place], [place]    Comment: alcoholic  Tobacco Use   Smoking status: Former    Packs/day: 0.50    Years: 29.00    Pack years: 14.50    Types: Cigarettes    Quit date: 2006    Years since quitting: 17.0   Smokeless tobacco: Never  Vaping Use   Vaping Use: Never used  Substance and Sexual Activity   Alcohol use: Not Currently    Comment: quit 2006   Drug use: Not Currently    Comment: remote h/o heavy marijuana use, also used cocaine and others   Sexual activity: Not Currently  Other Topics Concern   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, EMT certified      Lives alone   Likes to walk      2 sons -in [place] and [place] Determinants of Health   Financial Resource Strain: Low Risk    Difficulty of Paying Living Expenses: Not hard at all  Food Insecurity: No Food Insecurity   Worried About Charity fundraiser in the Last Year: Never true   Arboriculturist in the Last Year: Never true  Transportation  Needs: No Transportation Needs   Lack of Transportation (Medical): No   Lack of Transportation (Non-Medical): No  Physical Activity: Sufficiently Active   Days of Exercise per Week: 7 days   Minutes of Exercise per Session: 30 min  Stress: No Stress Concern Present   Feeling of Stress : Not at all  Social Connections: Moderately Isolated   Frequency of Communication with Friends and Family: More than three times a week   Frequency of Social Gatherings with Friends and Family: Twice a week   Attends Religious Services: More than 4 times per year   Active Member of [employer] or Organizations: No   Attends Music therapist: Never   Marital Status: Divorced    Tobacco Counseling Counseling given: Not Answered   Clinical Intake:  Pre-visit preparation completed: Yes  Pain : No/denies pain Pain Score: 0-No pain     BMI - recorded: 26.01 Nutritional Status: BMI 25 -29 Overweight Nutritional Risks: None Diabetes: Yes CBG done?: No Did pt. bring in CBG monitor from home?: No  How often do you need to have someone help you when you read instructions, pamphlets, or other written materials from your doctor or pharmacy?: 1 - Never What is the last grade level you completed in school?: 12+  Diabetic?yes Nutrition Risk Assessment:  Has the patient had any N/V/D within the last 2 months?  No  Does the patient have any non-healing wounds?  No  Has the patient had any unintentional weight loss or weight gain?  No   Diabetes:  Is the patient diabetic?  Yes  If diabetic, was a CBG obtained today?  No  Did the patient bring in their glucometer from home?  No  How often do you monitor your CBG's? never.   Financial Strains and Diabetes Management:  Are you having any financial strains with the device, your supplies  or your medication? No .  Does the patient want to be seen by Chronic Care Management for management of their diabetes?  No  Would the patient like to be  referred to a Nutritionist or for Diabetic Management?  No   Diabetic Exams:  Diabetic Eye Exam: Completed 06/18/2020 Diabetic Foot Exam: Completed 12/31/2020    Interpreter Needed?: No      Activities of Daily Living In your present state of health, do you have any difficulty performing the following activities: 02/25/2021  Hearing? N  Vision? N  Difficulty concentrating or making decisions? N  Walking or climbing stairs? N  Dressing or bathing? N  Doing errands, shopping? N  Preparing Food and eating ? N  Using the Toilet? N  In the past six months, have you accidently leaked urine? N  Do you have problems with loss of bowel control? N  Managing your Medications? N  Managing your Finances? N  Housekeeping or managing your Housekeeping? N  Some recent data might be hidden    Patient Care Team: Lindell Spar, MD as PCP - General (Internal Medicine) Fay Records, MD as PCP - Cardiology (Cardiology)  Indicate any recent Medical Services you may have received from other than [facility] providers in the past year (date may be approximate).     Assessment:   This is a routine wellness examination for Magdiel.  Hearing/Vision screen No results found.  Dietary issues and exercise activities discussed: Current Exercise Habits: Home exercise routine, Type of exercise: walking, Time (Minutes): 30, Frequency (Times/Week): 7, Weekly Exercise (Minutes/Week): 210, Intensity: Mild, Exercise limited by: None identified   Goals Addressed             This Visit's Progress    Patient Stated       Exercise,walk more,read more,better diet.      Depression Screen PHQ 2/9 Scores 02/25/2021 02/25/2021 01/07/2021 11/22/2020 05/21/2020 02/02/2020 07/11/2019  PHQ - 2 Score 0 0 0 0 0 0 0  PHQ- 9 Score - - - - 0 0 -  Exception Documentation - - - - - - Medical reason    Fall Risk Fall Risk  02/25/2021 01/07/2021 11/22/2020 05/21/2020 02/07/2020  Falls in the past year? 0 0 0 0 0  Number falls in  past yr: 0 0 0 - -  Injury with Fall? 0 0 0 - -  Risk for fall due to : No Fall Risks No Fall Risks No Fall Risks - -  Follow up Falls evaluation completed Falls evaluation completed Falls evaluation completed - -    FALL RISK PREVENTION PERTAINING TO THE HOME:  Any stairs in or around the home? yes If so, are there any without handrails? Yes  Home free of loose throw rugs in walkways, pet beds, electrical cords, etc? Yes  Adequate lighting in your home to reduce risk of falls? Yes   ASSISTIVE DEVICES UTILIZED TO PREVENT FALLS:  Life alert? No  Use of a cane, walker or w/c? No  Grab bars in the bathroom? Yes  Shower chair or bench in shower? No  Elevated toilet seat or a handicapped toilet? No   MMSE - Mini Mental State Exam 02/25/2021  Not completed: Unable to complete     6CIT Screen 02/25/2021 02/07/2020  What Year? 0 points 0 points  What month? 0 points 0 points  What time? 0 points 0 points  Count back from 20 0 points 0 points  Months in reverse 0 points 0  points  Repeat phrase 0 points 0 points  Total Score 0 0    Immunizations There is no immunization history for the selected administration types on file for this patient.  TDAP status: Due, Education has been provided regarding the importance of this vaccine. Advised may receive this vaccine at local pharmacy or Health Dept. Aware to provide a copy of the vaccination record if obtained from local pharmacy or Health Dept. Verbalized acceptance and understanding.  Flu Vaccine status: Due, Education has been provided regarding the importance of this vaccine. Advised may receive this vaccine at local pharmacy or Health Dept. Aware to provide a copy of the vaccination record if obtained from local pharmacy or Health Dept. Verbalized acceptance and understanding.  Pneumococcal vaccine status: Due, Education has been provided regarding the importance of this vaccine. Advised may receive this vaccine at local pharmacy or Health  Dept. Aware to provide a copy of the vaccination record if obtained from local pharmacy or Health Dept. Verbalized acceptance and understanding.  Covid-19 vaccine status: Information provided on how to obtain vaccines.   Qualifies for Shingles Vaccine? Yes   Zostavax completed No   Shingrix Completed?: No.    Education has been provided regarding the importance of this vaccine. Patient has been advised to call insurance company to determine out of pocket expense if they have not yet received this vaccine. Advised may also receive vaccine at local pharmacy or Health Dept. Verbalized acceptance and understanding.  Screening Tests Health Maintenance  Topic Date Due   COVID-19 Vaccine (1) Never done   Pneumonia Vaccine 32+ Years old (1 - PCV) Never done   TETANUS/TDAP  Never done   Zoster Vaccines- Shingrix (1 of 2) Never done   INFLUENZA VACCINE  05/17/2021 (Originally 09/17/2020)   HEMOGLOBIN A1C  05/24/2021   OPHTHALMOLOGY EXAM  06/18/2021   FOOT EXAM  12/31/2021   COLONOSCOPY (Pts 45-80yrs Insurance coverage will need to be confirmed)  12/01/2023   Hepatitis C Screening  Completed   HPV VACCINES  Aged Out    Health Maintenance  Health Maintenance Due  Topic Date Due   COVID-19 Vaccine (1) Never done   Pneumonia Vaccine 106+ Years old (1 - PCV) Never done   TETANUS/TDAP  Never done   Zoster Vaccines- Shingrix (1 of 2) Never done    Colorectal cancer screening: Type of screening: Colonoscopy. Completed 12/01/2018. Repeat every 5 years  Lung Cancer Screening: (Low Dose CT Chest recommended if Age 51-80 years, 30 pack-year currently smoking OR have quit w/in 15years.) does not qualify.   Lung Cancer Screening Referral: N/A  Additional Screening:  Hepatitis C Screening: does qualify; Completed 09/17/2016  Vision Screening: Recommended annual ophthalmology exams for early detection of glaucoma and other disorders of the eye. Is the patient up to date with their annual eye exam?   Yes  Who is the provider or what is the name of the office in which the patient attends annual eye exams? Dr Jorja Loa If pt is not established with a provider, would they like to be referred to a provider to establish care? No .   Dental Screening: Recommended annual dental exams for proper oral hygiene  Community Resource Referral / Chronic Care Management: CRR required this visit?  No   CCM required this visit?  No      Plan:     I have personally reviewed and noted the following in the patients chart:   Medical and social history Use of alcohol, tobacco or  illicit drugs  Current medications and supplements including opioid prescriptions. Patient is not currently taking opioid prescriptions. Functional ability and status Nutritional status Physical activity Advanced directives List of other physicians Hospitalizations, surgeries, and ER visits in previous 12 months Vitals Screenings to include cognitive, depression, and falls Referrals and appointments  In addition, I have reviewed and discussed with patient certain preventive protocols, quality metrics, and best practice recommendations. A written personalized care plan for preventive services as well as general preventive health recommendations were provided to patient.   Mr. Bolger , Thank you for taking time to come for your Medicare Wellness Visit. I appreciate your ongoing commitment to your health goals. Please review the following plan we discussed and let me know if I can assist you in the future.   These are the goals we discussed:  Goals      Blood Pressure < 140/90     Exercise 150 minutes per week (walking)     Patient Stated     Exercise,walk more,read [employer]        This is a list of the screening recommended for you and due dates:  Health Maintenance  Topic Date Due   COVID-19 Vaccine (1) Never done   Pneumonia Vaccine (1 - PCV) Never done   Tetanus Vaccine  Never done   Zoster  (Shingles) Vaccine (1 of 2) Never done   Flu Shot  05/17/2021*   Hemoglobin A1C  05/24/2021   Eye exam for diabetics  06/18/2021   Complete foot exam   12/31/2021   Colon Cancer Screening  12/01/2023   Hepatitis C Screening: USPSTF Recommendation to screen - Ages 18-79 yo.  Completed   HPV Vaccine  Aged Out  *Topic was postponed. The date shown is not the original due date.      Quentin Angst, [place]   02/25/2021

## 2021-02-25 NOTE — Patient Instructions (Signed)
[name] , Thank you for taking time to come for your Medicare Wellness Visit. I appreciate your ongoing commitment to your health goals. Please review the following plan we discussed and let me know if I can assist you in the future.   Screening recommendations/referrals: Colonoscopy: complete Recommended yearly ophthalmology/optometry visit for glaucoma screening and checkup Recommended yearly dental visit for hygiene and checkup  Vaccinations: Influenza vaccine: due now Pneumococcal vaccine: due now Tdap vaccine: due now Shingles vaccine: due now    Advanced directives: complete  Conditions/risks identified: diabetes, hypertension.  Next appointment: 1 year  Preventive Care 110 Years and Older, Male Preventive care refers to lifestyle choices and visits with your health care provider that can promote health and wellness. What does preventive care include? A yearly physical exam. This is also called an annual well check. Dental exams once or twice a year. Routine eye exams. Ask your health care provider how often you should have your eyes checked. Personal lifestyle choices, including: Daily care of your teeth and gums. Regular physical activity. Eating a healthy diet. Avoiding tobacco and drug use. Limiting alcohol use. Practicing safe sex. Taking low doses of aspirin every day. Taking vitamin and mineral supplements as recommended by your health care provider. What happens during an annual well check? The services and screenings done by your health care provider during your annual well check will depend on your age, overall health, lifestyle risk factors, and family history of disease. Counseling  Your health care provider may ask you questions about your: Alcohol use. Tobacco use. Drug use. Emotional well-being. Home and relationship well-being. Sexual activity. Eating habits. History of falls. Memory and ability to understand (cognition). Work and work  Statistician. Screening  You may have the following tests or measurements: Height, weight, and BMI. Blood pressure. Lipid and cholesterol levels. These may be checked every 5 years, or more frequently if you are over 47 years old. Skin check. Lung cancer screening. You may have this screening every year starting at age 29 if you have a 30-pack-year history of smoking and currently smoke or have quit within the past 15 years. Fecal occult blood test (FOBT) of the stool. You may have this test every year starting at age 35. Flexible sigmoidoscopy or colonoscopy. You may have a sigmoidoscopy every 5 years or a colonoscopy every 10 years starting at age 3. Prostate cancer screening. Recommendations will vary depending on your family history and other risks. Hepatitis C blood test. Hepatitis B blood test. Sexually transmitted disease (STD) testing. Diabetes screening. This is done by checking your blood sugar (glucose) after you have not eaten for a while (fasting). You may have this done every 1-3 years. Abdominal aortic aneurysm (AAA) screening. You may need this if you are a current or former smoker. Osteoporosis. You may be screened starting at age 44 if you are at high risk. Talk with your health care provider about your test results, treatment options, and if necessary, the need for more tests. Vaccines  Your health care provider may recommend certain vaccines, such as: Influenza vaccine. This is recommended every year. Tetanus, diphtheria, and acellular pertussis (Tdap, Td) vaccine. You may need a Td booster every 10 years. Zoster vaccine. You may need this after age 50. Pneumococcal 13-valent conjugate (PCV13) vaccine. One dose is recommended after age 66. Pneumococcal polysaccharide (PPSV23) vaccine. One dose is recommended after age 52. Talk to your health care provider about which screenings and vaccines you need and how often you need  them. This information is not intended to replace  advice given to you by your health care provider. Make sure you discuss any questions you have with your health care provider. Document Released: 03/02/2015 Document Revised: 10/24/2015 Document Reviewed: 12/05/2014 Elsevier Interactive Patient Education  2017 [place] Prevention in the Home Falls can cause injuries. They can happen to people of all ages. There are many things you can do to make your home safe and to help prevent falls. What can I do on the outside of my home? Regularly fix the edges of walkways and driveways and fix any cracks. Remove anything that might make you trip as you walk through a door, such as a raised step or threshold. Trim any bushes or trees on the path to your home. Use bright outdoor lighting. Clear any walking paths of anything that might make someone trip, such as rocks or tools. Regularly check to see if handrails are loose or broken. Make sure that both sides of any steps have handrails. Any raised decks and porches should have guardrails on the edges. Have any leaves, snow, or ice cleared regularly. Use sand or salt on walking paths during winter. Clean up any spills in your garage right away. This includes oil or grease spills. What can I do in the bathroom? Use night lights. Install grab bars by the toilet and in the tub and shower. Do not use towel bars as grab bars. Use non-skid mats or decals in the tub or shower. If you need to sit down in the shower, use a plastic, non-slip stool. Keep the floor dry. Clean up any water that spills on the floor as soon as it happens. Remove soap buildup in the tub or shower regularly. Attach bath mats securely with double-sided non-slip rug tape. Do not have throw rugs and other things on the floor that can make you trip. What can I do in the bedroom? Use night lights. Make sure that you have a light by your bed that is easy to reach. Do not use any sheets or blankets that are too big for your bed.  They should not hang down onto the floor. Have a firm chair that has side arms. You can use this for support while you get dressed. Do not have throw rugs and other things on the floor that can make you trip. What can I do in the kitchen? Clean up any spills right away. Avoid walking on wet floors. Keep items that you use a lot in easy-to-reach places. If you need to reach something above you, use a strong step stool that has a grab bar. Keep electrical cords out of the way. Do not use floor polish or wax that makes floors slippery. If you must use wax, use non-skid floor wax. Do not have throw rugs and other things on the floor that can make you trip. What can I do with my stairs? Do not leave any items on the stairs. Make sure that there are handrails on both sides of the stairs and use them. Fix handrails that are broken or loose. Make sure that handrails are as long as the stairways. Check any carpeting to make sure that it is firmly attached to the stairs. Fix any carpet that is loose or worn. Avoid having throw rugs at the top or bottom of the stairs. If you do have throw rugs, attach them to the floor with carpet tape. Make sure that you have a light switch at  the top of the stairs and the bottom of the stairs. If you do not have them, ask someone to add them for you. What else can I do to help prevent falls? Wear shoes that: Do not have high heels. Have rubber bottoms. Are comfortable and fit you well. Are closed at the toe. Do not wear sandals. If you use a stepladder: Make sure that it is fully opened. Do not climb a closed stepladder. Make sure that both sides of the stepladder are locked into place. Ask someone to hold it for you, if possible. Clearly mark and make sure that you can see: Any grab bars or handrails. First and last steps. Where the edge of each step is. Use tools that help you move around (mobility aids) if they are needed. These  include: Canes. Walkers. Scooters. Crutches. Turn on the lights when you go into a dark area. Replace any light bulbs as soon as they burn out. Set up your furniture so you have a clear path. Avoid moving your furniture around. If any of your floors are uneven, fix them. If there are any pets around you, be aware of where they are. Review your medicines with your doctor. Some medicines can make you feel dizzy. This can increase your chance of falling. Ask your doctor what other things that you can do to help prevent falls. This information is not intended to replace advice given to you by your health care provider. Make sure you discuss any questions you have with your health care provider. Document Released: 11/30/2008 Document Revised: 07/12/2015 Document Reviewed: 03/10/2014 Elsevier Interactive Patient Education  2017 [employer].   [name] , Thank you for taking time to come for your Medicare Wellness Visit. I appreciate your ongoing commitment to your health goals. Please review the following plan we discussed and let me know if I can assist you in the future.   These are the goals we discussed:  Goals      Blood Pressure < 140/90     Exercise 150 minutes per week (walking)     Patient Stated     Exercise,walk more,read [employer]        This is a list of the screening recommended for you and due dates:  Health Maintenance  Topic Date Due   COVID-19 Vaccine (1) Never done   Pneumonia Vaccine (1 - PCV) Never done   Tetanus Vaccine  Never done   Zoster (Shingles) Vaccine (1 of 2) Never done   Flu Shot  05/17/2021*   Hemoglobin A1C  05/24/2021   Eye exam for diabetics  06/18/2021   Complete foot exam   12/31/2021   Colon Cancer Screening  12/01/2023   Hepatitis C Screening: USPSTF Recommendation to screen - Ages 18-79 yo.  Completed   HPV Vaccine  Aged Out  *Topic was postponed. The date shown is not the original due date.

## 2021-03-07 ENCOUNTER — Other Ambulatory Visit: Payer: Self-pay

## 2021-03-07 ENCOUNTER — Ambulatory Visit (HOSPITAL_COMMUNITY)
Admission: RE | Admit: 2021-03-07 | Discharge: 2021-03-07 | Disposition: A | Discharge status: Home / Self Care | Payer: Medicare PPO | Source: Ambulatory Visit | Attending: Vascular Surgery | Admitting: Vascular Surgery

## 2021-03-07 ENCOUNTER — Ambulatory Visit: Payer: Medicare PPO | Admitting: Physician Assistant

## 2021-03-07 ENCOUNTER — Encounter: Payer: Self-pay | Admitting: Physician Assistant

## 2021-03-07 VITALS — BP 110/73 | HR 64 | Temp 98.0°F | Resp 20 | Ht 75.0 in | Wt 204.0 lb

## 2021-03-07 DIAGNOSIS — I6522 Occlusion and stenosis of left carotid artery: Secondary | ICD-10-CM | POA: Insufficient documentation

## 2021-03-07 DIAGNOSIS — I6523 Occlusion and stenosis of bilateral carotid arteries: Secondary | ICD-10-CM

## 2021-03-07 NOTE — Progress Notes (Signed)
HISTORY AND PHYSICAL     CC:  follow up. Requesting Provider:  Lindell Spar, MD  HPI: This is a 71 y.o. male here for follow up for carotid artery stenosis.  Pt is s/p left CEA for symptomatic carotid artery stenosis on 08/01/2016 by Dr. Scot Dock.  Pt was last seen January 2022 and at that time he was doing well without stroke symptoms.  He was walking about a mile daily.   He denies any pain in his legs or feet.   Pt returns today for follow up.    Pt denies any amaurosis fugax, speech difficulties, weakness, numbness, paralysis or clumsiness or facial droop.  He continues to walk a mile on most days.  He states his blood pressure remains under pretty good control.   The pt is  on a statin for cholesterol management.  The pt is on a daily aspirin.   Other AC:  Plavix The pt is on BB, ACEI, diuretic for hypertension.   The pt is not diabetic.   Tobacco hx:  former  Pt does not have family hx of AAA.  Past Medical History:  Diagnosis Date   Cerebellar stroke ([place]) 06/24/2016   Admitted with dizziness and "falling to the left" 06/24/16. Work up revealed multifocal acute ischemia within the right cerebellar hemisphere with occlusion versus severe stenosis of the right PICA and extensive, multifocal intracranial atherosclerosis with moderate stenoses of multiple distal MCA branches.   Colon polyps    CVA (cerebral vascular accident) ([place]) 06/21/2016   Diabetes mellitus without complication ([place]) 02/19/7251   Hyperlipidemia    Hypertension    Myocardial infarction ([place])    Pneumonia    Substance abuse ([place])    hx of EtOH and MJ    Past Surgical History:  Procedure Laterality Date   CAROTID ENDARTERECTOMY Left    COLON SURGERY  [phone]   growth removed, Laparoscopic Vs. open Left hemicolectomy Dr. Lovena Neighbours 09-25-17   COLONOSCOPY N/A 06/26/2017   Procedure: COLONOSCOPY;  Surgeon: [name], MD;  [facility];  Service: Endoscopy;  Laterality: N/A;  730    COLONOSCOPY N/A 12/01/2018   Procedure: COLONOSCOPY;  Surgeon: [name], MD;  [facility];  Service: Endoscopy;  Laterality: N/A;  930   COLONOSCOPY WITH PROPOFOL N/A 10/07/2017   Procedure: COLONOSCOPY WITH PROPOFOL WITH TATTOO;  Surgeon: [name], MD;  Location: [facility];  Service: General;  Laterality: N/A;   ENDARTERECTOMY Left 08/01/2016   Procedure: ENDARTERECTOMY LEFT CAROTID;  Surgeon: [name], MD;  Location: [facility];  Service: Vascular;  Laterality: Left;   FLEXIBLE SIGMOIDOSCOPY N/A 10/08/2017   Procedure: FLEXIBLE SIGMOIDOSCOPY;  Surgeon: [name], MD;  Location: [facility];  Service: General;  Laterality: N/A;   LAPAROSCOPIC RIGHT HEMI COLECTOMY Left 10/08/2017   Procedure: LAPAROSCOPIC VERSES OPEN LEFT HEMI COLECTOMY ERAS PATHWAY;  Surgeon: [name], MD;  Location: [facility];  Service: General;  Laterality: Left;   PATCH ANGIOPLASTY Left 08/01/2016   Procedure: PATCH ANGIOPLASTY LEFT CAROTID ARTERY USING ZENOSURE BIOLOGIC PATCH;  Surgeon: [name], MD;  Location: [facility];  Service: Vascular;  Laterality: Left;   POLYPECTOMY  12/01/2018   Procedure: POLYPECTOMY;  Surgeon: [name], MD;  [facility];  Service: Endoscopy;;   SUBMUCOSAL INJECTION  10/07/2017   Procedure: SUBMUCOSAL INJECTION;  Surgeon: [name], MD;  Location: [facility];  Service: General;;   TONSILLECTOMY  Allergies  Allergen Reactions   Blood-Group Specific Substance     No whole blood products    Current Outpatient Medications  Medication Sig Dispense Refill   aspirin EC 81 MG tablet Take 1 tablet (81 mg total) by mouth daily. 90 tablet 3   atorvastatin (LIPITOR) 80 MG tablet Take 1 tablet (80 mg total) by mouth daily. 90 tablet 1   carvedilol (COREG) 3.125 MG tablet Take 1 tablet (3.125 mg total) by mouth 2 (two) times daily with a meal. 180 tablet 3   Cholecalciferol (VITAMIN D3) 250 MCG (10000  UT) capsule Take 10,000 Units by mouth daily.     clopidogrel (PLAVIX) 75 MG tablet Take 1 tablet (75 mg total) by mouth daily. 90 tablet 1   ezetimibe (ZETIA) 10 MG tablet Take 1 tablet (10 mg total) by mouth daily. 90 tablet 3   hydrochlorothiazide (HYDRODIURIL) 25 MG tablet Take 1 tablet (25 mg total) by mouth daily. 90 tablet 3   lisinopril (ZESTRIL) 20 MG tablet Take 1 tablet (20 mg total) by mouth daily. 90 tablet 3   tadalafil (CIALIS) 5 MG tablet Take 1 tablet (5 mg total) by mouth daily as needed for erectile dysfunction. 30 tablet 2   No current facility-administered medications for this visit.    Family History  Problem Relation Age of Onset   Cancer Mother    Breast cancer Mother        lived to be 73   CVA Mother    CAD Mother    Arthritis Mother    Hypertension Mother    Other Father        gunshot wound   Early death Father        GSW   Cancer Brother        Stomach Cancer   Cancer Brother        Lung Cancer   Stroke Maternal Aunt    Alcohol abuse Maternal Uncle    Stroke Maternal Grandfather    Colon cancer Neg Hx     Social History   Socioeconomic History   Marital status: Divorced    Spouse name: Not on file   Number of children: 2   Years of education: 12   Highest education level: Not on file  Occupational History   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; 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[place], [place], [place], [place], [place], [place], [place], [place]    Comment: alcoholic  Tobacco Use   Smoking status: Former    Packs/day: 0.50    Years: 29.00    Pack years: 14.50    Types: Cigarettes    Quit date: 2006    Years since quitting: 17.0   Smokeless tobacco: Never  Vaping Use   Vaping Use: Never used  Substance and Sexual Activity   Alcohol use: Not Currently    Comment: quit 2006   Drug use: Not Currently    Comment: remote h/o heavy marijuana use, also used cocaine and others   Sexual activity: Not Currently  Other Topics Concern   Not on file  Social History Narrative    Divorced   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, EMT certified      Lives alone   Likes to walk      2 sons -in Home and [place]   Social Determinants of Health   Financial Resource Strain: Low Risk    Difficulty of Paying Living Expenses: Not hard at all  Food Insecurity: No Food Insecurity   Worried  About Running Out of Food in the Last Year: Never true   Ran Out of Food in the Last Year: Never true  Transportation Needs: No Transportation Needs   Lack of Transportation (Medical): No   Lack of Transportation (Non-Medical): No  Physical Activity: Sufficiently Active   Days of Exercise per Week: 7 days   Minutes of Exercise per Session: 30 min  Stress: No Stress Concern Present   Feeling of Stress : Not at all  Social Connections: Moderately Isolated   Frequency of Communication with Friends and Family: More than three times a week   Frequency of Social Gatherings with Friends and Family: Twice a week   Attends Religious Services: More than 4 times per year   Active Member of [employer] or Organizations: No   Attends Music therapist: Never   Marital Status: Divorced  Human resources officer Violence: Not At Risk   Fear of Current or Ex-Partner: No   Emotionally Abused: No   Physically Abused: No   Sexually Abused: No     REVIEW OF SYSTEMS:   [X]  denotes positive finding, [ ]  denotes negative finding Cardiac  Comments:  Chest pain or chest pressure:    Shortness of breath upon exertion:    Short of breath when lying flat:    Irregular heart rhythm:        Vascular    Pain in calf, thigh, or hip brought on by ambulation:    Pain in feet at night that wakes you up from your sleep:     Blood clot in your veins:    Leg swelling:         Pulmonary    Oxygen at home:    Productive cough:     Wheezing:         Neurologic    Sudden weakness in arms or legs:     Sudden numbness in arms or legs:     Sudden onset of difficulty speaking or slurred speech:     Temporary loss of vision in one eye:     Problems with dizziness:         Gastrointestinal    Blood in stool:     Vomited blood:         Genitourinary    Burning when urinating:     Blood in urine:        Psychiatric    Major depression:         Hematologic    Bleeding problems:    Problems with blood clotting too easily:        Skin    Rashes or ulcers:        Constitutional    Fever or chills:      PHYSICAL EXAMINATION:  Today's Vitals   03/07/21 1130 03/07/21 1131  BP: 130/77 110/73  Pulse: 64   Resp: 20   Temp: 98 F (36.7 C)   TempSrc: Temporal   SpO2: 97%   Weight: 204 lb (92.5 kg)   Height: 6\' 3"  (1.905 m)    Body mass index is 25.5 kg/m.   General:  WDWN in NAD; vital signs documented above Gait: Not observed HENT: WNL, normocephalic Pulmonary: normal non-labored breathing Cardiac: regular HR, without carotid bruits Abdomen: soft, NT; aortic pulse is not palpable Skin: without rashes Vascular Exam/Pulses:  Right Left  Radial 2+ (normal) 2+ (normal)   Extremities: without ischemic changes, without Gangrene , without cellulitis; without open wounds Musculoskeletal: no muscle wasting or  atrophy  Neurologic: A&O X 3; moving all extremities equally; speech is fluent/normal Psychiatric:  The pt has Normal affect.   Non-Invasive Vascular Imaging:   Carotid Duplex on 03/07/2021: Right:  1-39% ICA stenosis Left:  1-39% ICA stenosis Vertebrals:  Bilateral vertebral arteries demonstrate antegrade flow.  Subclavians: Normal flow hemodynamics were seen in bilateral subclavian arteries.  Previous Carotid duplex on 03/07/2020: Right: 1-39% ICA stenosis Left:   1-39% ICA stenosis    ASSESSMENT/PLAN:: 71 y.o. male here for follow up carotid artery stenosis and is s/p eft CEA for symptomatic carotid artery stenosis on 08/01/2016 by Dr. Scot Dock.  -duplex today reveals bilateral 1-39% ICA stenosis.  He remains asymptomatic -discussed s/s of stroke with  pt and he understands should he develop any of these sx, he will go to the nearest ER or call 911. -pt will f/u in one year with carotid duplex -pt will call sooner should they have any issues. -continue statin/asa/plavix  Leontine Locket, [facility] [phone]  Clinic MD:  Scot Dock

## 2021-04-03 ENCOUNTER — Encounter: Payer: Self-pay | Admitting: Podiatry

## 2021-04-03 ENCOUNTER — Other Ambulatory Visit: Payer: Self-pay

## 2021-04-03 ENCOUNTER — Ambulatory Visit: Payer: Medicare HMO | Admitting: Podiatry

## 2021-04-03 DIAGNOSIS — M79674 Pain in right toe(s): Secondary | ICD-10-CM

## 2021-04-03 DIAGNOSIS — E119 Type 2 diabetes mellitus without complications: Secondary | ICD-10-CM

## 2021-04-03 DIAGNOSIS — M79675 Pain in left toe(s): Secondary | ICD-10-CM | POA: Diagnosis not present

## 2021-04-03 DIAGNOSIS — B351 Tinea unguium: Secondary | ICD-10-CM

## 2021-04-03 NOTE — Progress Notes (Signed)
This patient returns to my office for at risk foot care.  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  Patient is taking plavix.  This patient is unable to cut nails himself since the patient cannot reach his nails.These nails are painful walking and wearing shoes.  This patient presents for at risk foot care today.  General Appearance  Alert, conversant and in no acute stress.  Vascular  Dorsalis pedis and posterior tibial  pulses are weakly  palpable  bilaterally.  Capillary return is within normal limits  bilaterally. Temperature is within normal limits  bilaterally.  Neurologic  Senn-Weinstein monofilament wire test within normal limits/diminished   bilaterally. Muscle power within normal limits bilaterally.  Nails Thick disfigured discolored nails with subungual debris  from hallux to fifth toes bilaterally. No evidence of bacterial infection or drainage bilaterally.  Orthopedic  No limitations of motion  feet .  No crepitus or effusions noted.  No bony pathology or digital deformities noted. HAV  B/L.  Skin  normotropic skin with no porokeratosis noted bilaterally.  No signs of infections or ulcers noted.     Onychomycosis  Pain in right toes  Pain in left toes  Consent was obtained for treatment procedures.   Mechanical debridement of nails 1-5  bilaterally performed with a nail nipper.  Filed with dremel without incident.    Return office visit   3 months                  Told patient to return for periodic foot care and evaluation due to potential at risk complications.   Hermann Dottavio DPM  

## 2021-04-17 ENCOUNTER — Other Ambulatory Visit: Payer: Self-pay | Admitting: *Deleted

## 2021-04-17 ENCOUNTER — Telehealth: Payer: Self-pay

## 2021-04-17 DIAGNOSIS — E782 Mixed hyperlipidemia: Secondary | ICD-10-CM

## 2021-04-17 MED ORDER — EZETIMIBE 10 MG PO TABS: 10.0000 mg | ORAL_TABLET | Freq: Every day | ORAL | 3 refills | Status: DC

## 2021-04-17 NOTE — Telephone Encounter (Signed)
Patient came by the office need med refill ? ?Exetimibe 10 mg  ? ?Pharmacy: [facility] [address] [facility] ?

## 2021-04-17 NOTE — Telephone Encounter (Signed)
Pt medication sent to pharmacy  

## 2021-04-18 ENCOUNTER — Telehealth: Payer: Self-pay

## 2021-04-18 ENCOUNTER — Other Ambulatory Visit: Payer: Self-pay | Admitting: *Deleted

## 2021-04-18 MED ORDER — CLOPIDOGREL BISULFATE 75 MG PO TABS: 75.0000 mg | ORAL_TABLET | Freq: Every day | ORAL | 1 refills | Status: DC

## 2021-04-18 NOTE — Telephone Encounter (Signed)
Medication sent to pharmacy  

## 2021-04-18 NOTE — Telephone Encounter (Signed)
Patient need med refill Clopidogrel 75 mg ? ?Pharmacy: [facility] [address] ?

## 2021-04-30 DIAGNOSIS — I10 Essential (primary) hypertension: Secondary | ICD-10-CM | POA: Diagnosis not present

## 2021-05-01 LAB — BASIC METABOLIC PANEL
BUN/Creatinine Ratio: 18 (ref 10–24)
BUN: 25 mg/dL (ref 8–27)
CO2: 24 mmol/L (ref 20–29)
Calcium: 9.8 mg/dL (ref 8.6–10.2)
Chloride: 100 mmol/L (ref 96–106)
Creatinine, Ser: 1.37 mg/dL — ABNORMAL HIGH (ref 0.76–1.27)
Glucose: 111 mg/dL — ABNORMAL HIGH (ref 70–99)
Potassium: 5.4 mmol/L — ABNORMAL HIGH (ref 3.5–5.2)
Sodium: 140 mmol/L (ref 134–144)
eGFR: 55 mL/min/{1.73_m2} — ABNORMAL LOW (ref >59)

## 2021-05-08 ENCOUNTER — Encounter: Payer: Self-pay | Admitting: Internal Medicine

## 2021-05-08 ENCOUNTER — Ambulatory Visit (INDEPENDENT_AMBULATORY_CARE_PROVIDER_SITE_OTHER): Payer: Medicare HMO | Admitting: Internal Medicine

## 2021-05-08 ENCOUNTER — Other Ambulatory Visit: Payer: Self-pay

## 2021-05-08 VITALS — BP 132/88 | HR 80 | Resp 18 | Ht 75.0 in | Wt 204.8 lb

## 2021-05-08 DIAGNOSIS — I6523 Occlusion and stenosis of bilateral carotid arteries: Secondary | ICD-10-CM | POA: Diagnosis not present

## 2021-05-08 DIAGNOSIS — I10 Essential (primary) hypertension: Secondary | ICD-10-CM | POA: Diagnosis not present

## 2021-05-08 DIAGNOSIS — Z125 Encounter for screening for malignant neoplasm of prostate: Secondary | ICD-10-CM

## 2021-05-08 DIAGNOSIS — E782 Mixed hyperlipidemia: Secondary | ICD-10-CM | POA: Diagnosis not present

## 2021-05-08 DIAGNOSIS — E559 Vitamin D deficiency, unspecified: Secondary | ICD-10-CM

## 2021-05-08 DIAGNOSIS — E1159 Type 2 diabetes mellitus with other circulatory complications: Secondary | ICD-10-CM

## 2021-05-08 DIAGNOSIS — N1831 Chronic kidney disease, stage 3a: Secondary | ICD-10-CM

## 2021-05-08 LAB — POCT GLYCOSYLATED HEMOGLOBIN (HGB A1C): HbA1c, POC (controlled diabetic range): 6.6 % (ref 0.0–7.0)

## 2021-05-08 NOTE — Progress Notes (Signed)
? ?Established Patient Office Visit ? ?Subjective:  ?Patient ID: Albert Morris, male    DOB: 1950/04/11  Age: 71 y.o. MRN: [id number] ? ?CC:  ?Chief Complaint  ?Patient presents with  ? Follow-up  ?  4 month follow up HTN and HLD   ? ? ?HPI ?ELL TISO is a 71 y.o. male with past medical history of HTN, CAD, type II DM with HLD, CVA and tobacco abuse who presents for f/u of his chronic medical conditions. ? ?HTN: BP is well-controlled. Takes medications regularly. Patient denies headache, dizziness, chest pain, dyspnea or palpitations. ? ?HLD: He has been taking Lipitor and Zetia now. ? ?Type 2 DM: His HbA1C is stable at 6.6. Diet controlled. Does not prefer to take medicine for it. Denies any polyuria or polyphagia. ? ?Past Medical History:  ?Diagnosis Date  ? 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 Admitted with dizziness and "falling to the left" 06/24/16. Work up revealed multifocal acute ischemia within the right cerebellar hemisphere with occlusion versus severe stenosis of the right PICA and extensive, multifocal intracranial atherosclerosis with moderate stenoses of multiple distal MCA branches.  ? Colon polyps   ? CVA (cerebral vascular accident) ([place]) 06/21/2016  ? Diabetes mellitus without complication ([place]) 62/83/1517  ? Hyperlipidemia   ? Hypertension   ? Myocardial infarction [facility])   ? Pneumonia   ? Substance abuse ([place])   ? hx of EtOH and MJ  ? ? ?Past Surgical History:  ?Procedure Laterality Date  ? CAROTID ENDARTERECTOMY Left   ? COLON SURGERY  [phone]  ? growth removed, Laparoscopic Vs. open Left hemicolectomy Dr. Winter 09-25-17  ? COLONOSCOPY N/A 06/26/2017  ? Procedure: COLONOSCOPY;  Surgeon: [name], MD;  [facility];  Service: Endoscopy;  Laterality: N/A;  730  ? COLONOSCOPY N/A 12/01/2018  ? Procedure: COLONOSCOPY;  Surgeon: [name], MD;  [facility];  Service: Endoscopy;  Laterality: N/A;  930  ? COLONOSCOPY WITH PROPOFOL N/A 10/07/2017  ?  Procedure: COLONOSCOPY WITH PROPOFOL WITH TATTOO;  Surgeon: [name], MD;  [facility];  Service: General;  Laterality: N/A;  ? ENDARTERECTOMY Left 08/01/2016  ? Procedure: ENDARTERECTOMY LEFT CAROTID;  Surgeon: [name], MD;  Location: [place];  Service: Vascular;  Laterality: Left;  ? FLEXIBLE SIGMOIDOSCOPY N/A 10/08/2017  ? Procedure: FLEXIBLE SIGMOIDOSCOPY;  Surgeon: [name], MD;  [facility];  Service: General;  Laterality: N/A;  ? LAPAROSCOPIC RIGHT HEMI COLECTOMY Left 10/08/2017  ? Procedure: LAPAROSCOPIC VERSES OPEN LEFT HEMI COLECTOMY ERAS PATHWAY;  Surgeon: [name], MD;  [facility];  Service: General;  Laterality: Left;  ? PATCH ANGIOPLASTY Left 08/01/2016  ? Procedure: PATCH ANGIOPLASTY LEFT CAROTID ARTERY USING ZENOSURE BIOLOGIC PATCH;  Surgeon: [name], MD;  Location: [place];  Service: Vascular;  Laterality: Left;  ? POLYPECTOMY  12/01/2018  ? Procedure: POLYPECTOMY;  Surgeon: [name], MD;  [facility];  Service: Endoscopy;;  ? SUBMUCOSAL INJECTION  10/07/2017  ? Procedure: SUBMUCOSAL INJECTION;  Surgeon: [name], MD;  [facility];  Service: General;;  ? TONSILLECTOMY    ? ? ?Family History  ?Problem Relation Age of Onset  ? Cancer Mother   ? Breast cancer Mother   ?     lived to be 58  ? CVA Mother   ? CAD Mother   ? Arthritis Mother   ? Hypertension Mother   ? Other Father   ?  gunshot wound  ? Early death Father   ?     GSW  ? Cancer Brother   ?     Stomach Cancer  ? Cancer Brother   ?     Lung Cancer  ? Stroke Maternal Aunt   ? Alcohol abuse Maternal Uncle   ? Stroke Maternal Grandfather   ? Colon cancer Neg Hx   ? ? ?Social History  ? ?Socioeconomic History  ? Marital status: Divorced  ?  Spouse name: Not on file  ? Number of children: 2  ? Years of education: 52  ? Highest education level: Not on file  ?Occupational History  ? Occupation: disabled Veteran x8 years;  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[place], [place], [place], [place], [place], [place], [place], [place]  ?  Comment: alcoholic  ?Tobacco Use  ? Smoking status: Former  ?  Packs/day: 0.50  ?  Years: 29.00  ?  Pack years: 14.50  ?  Types: Cigarettes  ?  Quit date: 2006  ?  Years since quitting: 17.2  ? Smokeless tobacco: Never  ?Vaping Use  ? Vaping Use: Never used  ?Substance and Sexual Activity  ? Alcohol use: Not Currently  ?  Comment: quit 2006  ? Drug use: Not Currently  ?  Comment: remote h/o heavy marijuana use, also used cocaine and others  ? Sexual activity: Not Currently  ?Other Topics Concern  ? Not on file  ?Social History Narrative  ? Divorced  ? 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army for 8 years, EMT certified  ?   ? Lives alone  ? Likes to walk  ?   ? 2 sons -in [place] and [place]  ? ?Social Determinants of Health  ? ?Financial Resource Strain: Low Risk   ? Difficulty of Paying Living Expenses: Not hard at all  ?Food Insecurity: No Food Insecurity  ? Worried About Charity fundraiser in the Last Year: Never true  ? Ran Out of Food in the Last Year: Never true  ?Transportation Needs: No Transportation Needs  ? Lack of Transportation (Medical): No  ? Lack of Transportation (Non-Medical): No  ?Physical Activity: Sufficiently Active  ? Days of Exercise per Week: 7 days  ? Minutes of Exercise per Session: 30 min  ?Stress: No Stress Concern Present  ? Feeling of Stress : Not at all  ?Social Connections: Moderately Isolated  ? Frequency of Communication with Friends and Family: More than three times a week  ? Frequency of Social Gatherings with Friends and Family: Twice a week  ? Attends Religious Services: More than 4 times per year  ? Active Member of Clubs or Organizations: No  ? Attends Archivist Meetings: Never  ? Marital Status: Divorced  ?Intimate Partner Violence: Not At Risk  ? Fear of Current or Ex-Partner: No  ? Emotionally Abused: No  ? Physically Abused: No  ? Sexually Abused: No  ? ? ?Outpatient Medications Prior to Visit  ?Medication Sig  Dispense Refill  ? aspirin EC 81 MG tablet Take 1 tablet (81 mg total) by mouth daily. 90 tablet 3  ? atorvastatin (LIPITOR) 80 MG tablet Take 1 tablet (80 mg total) by mouth daily. 90 tablet 1  ? carvedilol (COREG) 3.125 MG tablet Take 1 tablet (3.125 mg total) by mouth 2 (two) times daily with a meal. 180 tablet 3  ? Cholecalciferol (VITAMIN D) 50 MCG (2000 UT) CAPS Take 1 capsule by mouth daily.    ? clopidogrel (PLAVIX) 75 MG tablet Take 1 tablet (  75 mg total) by mouth daily. 90 tablet 1  ? ezetimibe (ZETIA) 10 MG tablet Take 1 tablet (10 mg total) by mouth daily. 90 tablet 3  ? hydrochlorothiazide (HYDRODIURIL) 25 MG tablet Take 1 tablet (25 mg total) by mouth daily. 90 tablet 3  ? lisinopril (ZESTRIL) 20 MG tablet Take 1 tablet (20 mg total) by mouth daily. 90 tablet 3  ? tadalafil (CIALIS) 5 MG tablet Take 1 tablet (5 mg total) by mouth daily as needed for erectile dysfunction. 30 tablet 2  ? Cholecalciferol (VITAMIN D3) 250 MCG (10000 UT) capsule Take 10,000 Units by mouth daily.    ? ?No facility-administered medications prior to visit.  ? ? ?Allergies  ?Allergen Reactions  ? Blood-Group Specific Substance   ?  No whole blood products  ? ? ?ROS ?Review of Systems  ?Constitutional:  Negative for chills and fever.  ?HENT:  Negative for congestion and sore throat.   ?Eyes:  Negative for pain and discharge.  ?Respiratory:  Negative for cough and shortness of breath.   ?Cardiovascular:  Negative for chest pain and palpitations.  ?Gastrointestinal:  Negative for constipation, diarrhea, nausea and vomiting.  ?Endocrine: Negative for polydipsia and polyuria.  ?Genitourinary:  Negative for dysuria and hematuria.  ?Musculoskeletal:  Negative for neck pain and neck stiffness.  ?Skin:  Negative for rash.  ?Neurological:  Negative for dizziness, weakness, numbness and headaches.  ?Psychiatric/Behavioral:  Negative for agitation and behavioral problems.   ? ?  ?Objective:  ?  ?Physical Exam ?Vitals reviewed.   ?Constitutional:   ?   General: He is not in acute distress. ?   Appearance: He is not diaphoretic.  ?HENT:  ?   Head: Normocephalic and atraumatic.  ?   Nose: Nose normal.  ?   Mouth/Throat:  ?   Mouth: Mucous membranes a

## 2021-05-08 NOTE — Assessment & Plan Note (Signed)
Likely from HTN, DM and age related decline ?On lisinopril and HCTZ for HTN ?Avoid nephrotoxic agents ?Advised to improve fluid intake to at least 64 ounces of fluid in a day ?

## 2021-05-08 NOTE — Assessment & Plan Note (Signed)
Lab Results  ?Component Value Date  ? HGBA1C 6.6 05/08/2021  ? ?Diet controlled ?Advised to follow diabetic diet ?On statin and ACEi ?F/u CMP and lipid panel ?Diabetic eye exam: Advised to follow up with Ophthalmology for diabetic eye exam ?

## 2021-05-08 NOTE — Assessment & Plan Note (Signed)
Advised to take Vitamin D 2000 IU QD instead of 28406 IU QD ?

## 2021-05-08 NOTE — Assessment & Plan Note (Signed)
BP Readings from Last 1 Encounters:  ?05/08/21 132/88  ? ?Well-controlled now ?On lisinopril, HCTZ and Coreg ?Counseled for compliance with the medications ?Advised DASH diet and moderate exercise/walking as tolerated ?

## 2021-05-08 NOTE — Assessment & Plan Note (Addendum)
Followed by Vascular surgery, has had left carotid endarterectomy On DAPT and statin 

## 2021-05-08 NOTE — Assessment & Plan Note (Signed)
Ordered PSA after discussing its limitations for prostate cancer screening, including false positive results leading additional investigations. 

## 2021-05-08 NOTE — Patient Instructions (Addendum)
Please take Vitamin D 2000 IU once daily instead of 81856 IU. ? ?Please continue to take other medications as prescribed. ? ?Please continue to follow low carb diet and ambulate as tolerated. ? ?Please consider getting Shingrix vaccine at your local pharmacy. ?

## 2021-06-06 NOTE — Progress Notes (Signed)
? ?  ? ?Cardiology Office Note ? ? ?Date:  06/07/2021  ? ?ID:  Albert Morris, DOB [birth year], MRN [id number] ? ?PCP:  Lindell Spar, MD  ?Cardiologist:   [name], MD  ? ?Patient presents for f/u of of HFmrEF ?  ?History of Present Illness: ?Albert Morris is a 71 y.o. male with a history of CVA in 2018; L CEA 2018, mild LV dysfunction LVEF 45 to 50% with basal inferoseptal akinesis,   Myovue in 07/03/16 showed inferior infar (moderate intensity base to apex)  LVEF 48%  Echo in April 2021 showed LVEF normal    ?I saw the pt in March 2022 ?Since seen he has done OK  Denies CP  Breathing is OK   no signficant  ? ?Current Meds  ?Medication Sig  ? aspirin EC 81 MG tablet Take 1 tablet (81 mg total) by mouth daily.  ? atorvastatin (LIPITOR) 80 MG tablet Take 1 tablet (80 mg total) by mouth daily.  ? carvedilol (COREG) 3.125 MG tablet Take 1 tablet (3.125 mg total) by mouth 2 (two) times daily with a meal.  ? Cholecalciferol (VITAMIN D) 50 MCG (2000 UT) CAPS Take 1 capsule by mouth daily.  ? clopidogrel (PLAVIX) 75 MG tablet Take 1 tablet (75 mg total) by mouth daily.  ? ezetimibe (ZETIA) 10 MG tablet Take 1 tablet (10 mg total) by mouth daily.  ? hydrochlorothiazide (HYDRODIURIL) 25 MG tablet Take 1 tablet (25 mg total) by mouth daily.  ? lisinopril (ZESTRIL) 20 MG tablet Take 1 tablet (20 mg total) by mouth daily.  ? ? ? ?Allergies:   Blood-group specific substance  ? ?Past Medical History:  ?Diagnosis Date  ? 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 Admitted with dizziness and "falling to the left" 06/24/16. Work up revealed multifocal acute ischemia within the right cerebellar hemisphere with occlusion versus severe stenosis of the right PICA and extensive, multifocal intracranial atherosclerosis with moderate stenoses of multiple distal MCA branches.  ? Colon polyps   ? CVA (cerebral vascular accident) ([place]) 06/21/2016  ? Diabetes mellitus without complication ([place]) 41/32/4401  ? Hyperlipidemia   ? Hypertension    ? Myocardial infarction [facility])   ? Pneumonia   ? Substance abuse ([place])   ? hx of EtOH and MJ  ? ? ?Past Surgical History:  ?Procedure Laterality Date  ? CAROTID ENDARTERECTOMY Left   ? COLON SURGERY  [phone]  ? growth removed, Laparoscopic Vs. open Left hemicolectomy Dr. Winter 09-25-17  ? COLONOSCOPY N/A 06/26/2017  ? Procedure: COLONOSCOPY;  Surgeon: [name], MD;  [facility];  Service: Endoscopy;  Laterality: N/A;  730  ? COLONOSCOPY N/A 12/01/2018  ? Procedure: COLONOSCOPY;  Surgeon: [name], MD;  [facility];  Service: Endoscopy;  Laterality: N/A;  930  ? COLONOSCOPY WITH PROPOFOL N/A 10/07/2017  ? Procedure: COLONOSCOPY WITH PROPOFOL WITH TATTOO;  Surgeon: [name], MD;  [facility];  Service: General;  Laterality: N/A;  ? ENDARTERECTOMY Left 08/01/2016  ? Procedure: ENDARTERECTOMY LEFT CAROTID;  Surgeon: [name], MD;  Location: [facility];  Service: Vascular;  Laterality: Left;  ? FLEXIBLE SIGMOIDOSCOPY N/A 10/08/2017  ? Procedure: FLEXIBLE SIGMOIDOSCOPY;  Surgeon: [name], MD;  [facility];  Service: General;  Laterality: N/A;  ? LAPAROSCOPIC RIGHT HEMI COLECTOMY Left 10/08/2017  ? Procedure: LAPAROSCOPIC VERSES OPEN LEFT HEMI COLECTOMY ERAS PATHWAY;  Surgeon: [name], MD;  [facility];  Service:  General;  Laterality: Left;  ? PATCH ANGIOPLASTY Left 08/01/2016  ? Procedure: PATCH ANGIOPLASTY LEFT CAROTID ARTERY USING ZENOSURE BIOLOGIC PATCH;  Surgeon: [name], MD;  Location: [facility];  Service: Vascular;  Laterality: Left;  ? POLYPECTOMY  12/01/2018  ? Procedure: POLYPECTOMY;  Surgeon: [name], MD;  [facility];  Service: Endoscopy;;  ? SUBMUCOSAL INJECTION  10/07/2017  ? Procedure: SUBMUCOSAL INJECTION;  Surgeon: [name], MD;  [facility];  Service: General;;  ? TONSILLECTOMY    ? ? ? ?Social History:  The patient  reports that he quit smoking about  17 years ago. His smoking use included cigarettes. He has a 14.50 pack-year smoking history. He has never used smokeless tobacco. He reports that he does not currently use alcohol. He reports that he does not currently use drugs.  ? ?Family History:  The patient's family history includes Alcohol abuse in his maternal uncle; Arthritis in his mother; Breast cancer in his mother; CAD in his mother; CVA in his mother; Cancer in his brother, brother, and mother; Early death in his father; Hypertension in his mother; Other in his father; Stroke in his maternal aunt and maternal grandfather.  ? ? ?ROS:  Please see the history of present illness. All other systems are reviewed and  Negative to the above problem except as noted.  ? ? ?PHYSICAL EXAM: ?VS:  BP (!) 142/80   Pulse 60   Ht '6\' 3"'$  (1.905 m)   Wt 202 lb (91.6 kg)   SpO2 98%   BMI 25.25 kg/m?   ?GEN: Well nourished, well developed, in no acute distress  ?HEENT: normal  ?Neck: no JVD, carotid bruits ?Cardiac: RRR; no murmurs,no LE  edema  ?Respiratory:  clear to auscultation bilaterally ?GI: soft, nontender, nondistended, + BS  No hepatomegaly  ?MS: no deformity Moving all extremities   ?Skin: warm and dry, no rash ?Neuro:  Strength and sensation are intact ?Psych: euthymic mood, full affect ?5 bpm   LVH with repolartization abnormality   Occasional PAC ? ?Myovue May 2018 ? ?No diagnostic ST segment changes over baseline abnormalities. Rare PVC. ?Medium sized, moderate intensity, inferior defect from apex to base that is fixed, more prominent on rest imaging than stress. Most consistent with scar, although there could be a component of soft tissue attenuation. There are no large ischemic territories noted. ?This is an intermediate risk study. ?Nuclear stress EF: 48%. ?  ? ? ?Echo   05/25/19 ? ?1. Left ventricular ejection fraction, by estimation, is 65 to 70%. The left ventricle has normal ?function. The left ventricle has no regional wall motion abnormalities.  There is moderate left ?ventricular hypertrophy. Left ventricular diastolic parameters are consistent with Grade I ?diastolic dysfunction (impaired relaxation). ?2. Right ventricular systolic function is normal. The right ventricular size is normal. There is ?mildly elevated pulmonary artery systolic pressure. ?3. The mitral valve is normal in structure. No evidence of mitral valve regurgitation. No ?evidence of mitral stenosis. ?4. The aortic valve has an indeterminant number of cusps. Aortic valve regurgitation is mild to ?moderate. No aortic stenosis is present. ?5. Aortic dilatation noted. There is mild dilatation of the ascending aorta measuring 40 mm. ?6. The inferior vena cava is normal in size with greater than 50% respiratory variability, ?suggesting right atrial pressure of 3 mmHg. ? ?Carotid USN  Jan 2022 ? ?Left Carotid: Patent carotid endarterectomy with velocity in the left ICA consistent with a 1-39% ?stenosis. ?Vertebrals: Bilateral vertebral arteries  demonstrate antegrade flow. The right vertebral artery ?somewhat difficult to visualize ?Subclavians: Normal flow hemodynamics were seen in bilateral subclavian arteries. ? ?Lipid Panel ?   ?Component Value Date/Time  ? CHOL 174 11/23/2020 0915  ? TRIG 86 11/23/2020 0915  ? HDL 45 11/23/2020 0915  ? CHOLHDL 4.1 05/21/2020 1109  ? VLDL 13 09/17/2016 0720  ? LDLCALC 113 (H) 11/23/2020 0915  ? [place] 86 05/21/2020 1109  ? ?  ? ?Wt Readings from Last 3 Encounters:  ?06/07/21 202 lb (91.6 kg)  ?05/08/21 204 lb 12.8 oz (92.9 kg)  ?03/07/21 204 lb (92.5 kg)  ?  ? ? ?ASSESSMENT AND PLAN: ? ?1  Hx of LV dysfunction    Last echo in 2021 LVEF normal     ?Patient denies symptoms     ? ?2  Hx CV dz   S/p LCEA   Followed in vascular   heck lipomed ? ?3  HL  Will get lipo med, Lpa, ApoB   ? ?4  HTN  BP is fair   Follow   No changes   ? ?5   Aortic insuff.   Pt had echo in 2021   Mild AI on my review  AOrtic root is mildly dilated at 41   Will get echo in 1 year  ? ?  Stay active   F/U next year  ? ? ?Current medicines are reviewed at length with the patient today.  The patient does not have concerns regarding medicines. ? ?Signed, ?[name], MD  ?06/07/2021 10:16 AM    ?

## 2021-06-07 ENCOUNTER — Ambulatory Visit: Payer: Medicare HMO | Admitting: Internal Medicine

## 2021-06-07 ENCOUNTER — Encounter: Payer: Self-pay | Admitting: Internal Medicine

## 2021-06-07 VITALS — BP 142/80 | HR 60 | Ht 75.0 in | Wt 202.0 lb

## 2021-06-07 DIAGNOSIS — I25118 Atherosclerotic heart disease of native coronary artery with other forms of angina pectoris: Secondary | ICD-10-CM | POA: Diagnosis not present

## 2021-06-07 NOTE — Patient Instructions (Signed)
Medication Instructions:  ?Your physician recommends that you continue on your current medications as directed. Please refer to the Current Medication list given to you today. ? ?*If you need a refill on your cardiac medications before your next appointment, please call your pharmacy* ? ? ?Lab Work: ?Lipid Panel  ? ?If you have labs (blood work) drawn today and your tests are completely normal, you will receive your results only by: ?MyChart Message (if you have MyChart) OR ?A paper copy in the mail ?If you have any lab test that is abnormal or we need to change your treatment, we will call you to review the results. ? ? ?Testing/Procedures: ?NONE  ? ? ?Follow-Up: ?At [facility], you and your health needs are our priority.  As part of our continuing mission to provide you with exceptional heart care, we have created designated Provider Care Teams.  These Care Teams include your primary Cardiologist (physician) and Advanced Practice Providers (APPs -  Physician Assistants and Nurse Practitioners) who all work together to provide you with the care you need, when you need it. ? ?We recommend signing up for the patient portal called "MyChart".  Sign up information is provided on this After Visit Summary.  MyChart is used to connect with patients for Virtual Visits (Telemedicine).  Patients are able to view lab/test results, encounter notes, upcoming appointments, etc.  Non-urgent messages can be sent to your provider as well.   ?To learn more about what you can do with MyChart, go to [email or web address].   ? ?Your next appointment:   ? February  ? ?The format for your next appointment:   ?In Person ? ?Provider:   ?[name], MD  ? ? ?Other Instructions ?Thank you for choosing [facility]! ?  ? ?Important Information About Sugar ? ? ? ? ? ? ?

## 2021-06-19 DIAGNOSIS — E119 Type 2 diabetes mellitus without complications: Secondary | ICD-10-CM | POA: Diagnosis not present

## 2021-06-19 LAB — HM DIABETES EYE EXAM

## 2021-06-21 ENCOUNTER — Encounter: Payer: Self-pay | Admitting: *Deleted

## 2021-07-01 ENCOUNTER — Ambulatory Visit: Payer: Medicare HMO | Admitting: Podiatry

## 2021-07-01 ENCOUNTER — Encounter: Payer: Self-pay | Admitting: Podiatry

## 2021-07-01 DIAGNOSIS — E119 Type 2 diabetes mellitus without complications: Secondary | ICD-10-CM | POA: Diagnosis not present

## 2021-07-01 DIAGNOSIS — B351 Tinea unguium: Secondary | ICD-10-CM

## 2021-07-01 DIAGNOSIS — M79674 Pain in right toe(s): Secondary | ICD-10-CM

## 2021-07-01 DIAGNOSIS — M79675 Pain in left toe(s): Secondary | ICD-10-CM | POA: Diagnosis not present

## 2021-07-01 NOTE — Progress Notes (Signed)
This patient returns to my office for at risk foot care.  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  Patient is taking plavix.  This patient is unable to cut nails himself since the patient cannot reach his nails.These nails are painful walking and wearing shoes.  This patient presents for at risk foot care today.  General Appearance  Alert, conversant and in no acute stress.  Vascular  Dorsalis pedis and posterior tibial  pulses are weakly  palpable  bilaterally.  Capillary return is within normal limits  bilaterally. Temperature is within normal limits  bilaterally.  Neurologic  Senn-Weinstein monofilament wire test within normal limits/diminished   bilaterally. Muscle power within normal limits bilaterally.  Nails Thick disfigured discolored nails with subungual debris  from hallux to fifth toes bilaterally. No evidence of bacterial infection or drainage bilaterally.  Orthopedic  No limitations of motion  feet .  No crepitus or effusions noted.  No bony pathology or digital deformities noted. HAV  B/L.  Skin  normotropic skin with no porokeratosis noted bilaterally.  No signs of infections or ulcers noted.     Onychomycosis  Pain in right toes  Pain in left toes  Consent was obtained for treatment procedures.   Mechanical debridement of nails 1-5  bilaterally performed with a nail nipper.  Filed with dremel without incident.    Return office visit   3 months                  Told patient to return for periodic foot care and evaluation due to potential at risk complications.   Caddie Randle DPM  

## 2021-07-19 ENCOUNTER — Telehealth: Payer: Self-pay | Admitting: Emergency Medicine

## 2021-07-19 ENCOUNTER — Ambulatory Visit
Admission: EM | Admit: 2021-07-19 | Discharge: 2021-07-19 | Disposition: A | Discharge status: Home / Self Care | Payer: Medicare HMO | Attending: Family Medicine | Admitting: Family Medicine

## 2021-07-19 DIAGNOSIS — M79672 Pain in left foot: Secondary | ICD-10-CM

## 2021-07-19 MED ORDER — PREDNISONE 20 MG PO TABS: 40.0000 mg | ORAL_TABLET | Freq: Every day | ORAL | 0 refills | Status: DC

## 2021-07-19 NOTE — ED Triage Notes (Signed)
Pt states his left foot began hurting across his toes yesterday and by the night time his whole left foot was hurting  Pt states this has never happened before   Denies Meds

## 2021-07-19 NOTE — ED Provider Notes (Signed)
[facility]    CSN: [id number] Arrival date & time: 07/19/21  7628      History   Chief Complaint Chief Complaint  Patient presents with   Foot Pain    HPI Albert Morris is a 71 y.o. male.   Presenting today with 1 day history of progressively worsening left foot pain that started at the joint lines of his toes and is now extending into the foot.  Denies notice of swelling up until this morning and now swollen, red, warm to the touch.  He denies any injury, numbness, tingling, weakness, loss of range of motion, fevers.  Trying lidocaine cream with no relief.  No past history of similar issues.   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 Work up revealed multifocal acute ischemia within the right cerebellar hemisphere with occlusion versus severe stenosis of the right PICA and extensive, multifocal intracranial atherosclerosis with moderate stenoses of multiple distal MCA branches.   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   Hypogonadism in male 11/22/2020   Anemia 11/22/2020   Pain due to onychomycosis of toenails of both feet 12/14/2019   History of tobacco use 01/26/2019   Hx of colonic polyps 11/12/2018   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[place]) 11/11/2018   Colon polyp 10/08/2017   Positive colorectal cancer  screening using Cologuard test 05/28/2017   Vitamin D deficiency 10/14/2016   History of carotid endarterectomy 10/14/2016   Left carotid artery stenosis 08/01/2016   History of MI (myocardial infarction) 07/08/2016   History of substance abuse ([place]) 07/08/2016   Coronary artery disease of native artery of native heart with stable angina pectoris ([place])    Essential hypertension 07/07/2016   Dyslipidemia 07/07/2016   Stenosis of carotid artery    Cardiomyopathy [facility])     Past Surgical History:  Procedure Laterality Date   CAROTID ENDARTERECTOMY Left    COLON SURGERY  0737,1062   growth removed, Laparoscopic Vs. open Left hemicolectomy Dr. Lovena Neighbours 09-25-17   COLONOSCOPY N/A 06/26/2017   Procedure: COLONOSCOPY;  Surgeon: [name], MD;  [facility];  Service: Endoscopy;  Laterality: N/A;  730   COLONOSCOPY N/A 12/01/2018   Procedure: COLONOSCOPY;  Surgeon: [name], MD;  [facility];  Service: Endoscopy;  Laterality: N/A;  930   COLONOSCOPY WITH PROPOFOL N/A 10/07/2017   Procedure: COLONOSCOPY WITH PROPOFOL WITH TATTOO;  Surgeon: [name], MD;  [facility];  Service: General;  Laterality: N/A;   ENDARTERECTOMY Left 08/01/2016   Procedure: ENDARTERECTOMY LEFT CAROTID;  Surgeon: [name], MD;  Location: [facility];  Service: Vascular;  Laterality: Left;   FLEXIBLE SIGMOIDOSCOPY N/A 10/08/2017   Procedure: FLEXIBLE SIGMOIDOSCOPY;  Surgeon: [name], MD;  [facility];  Service: General;  Laterality: N/A;  LAPAROSCOPIC RIGHT HEMI COLECTOMY Left 10/08/2017   Procedure: LAPAROSCOPIC VERSES OPEN LEFT HEMI COLECTOMY ERAS PATHWAY;  Surgeon: [name], MD;  [facility];  Service: General;  Laterality: Left;   PATCH ANGIOPLASTY Left 08/01/2016   Procedure: PATCH ANGIOPLASTY LEFT CAROTID ARTERY USING ZENOSURE BIOLOGIC PATCH;  Surgeon: [name], MD;  Location: [facility] OR;  Service: Vascular;   Laterality: Left;   POLYPECTOMY  12/01/2018   Procedure: POLYPECTOMY;  Surgeon: [name], MD;  [facility];  Service: Endoscopy;;   SUBMUCOSAL INJECTION  10/07/2017   Procedure: SUBMUCOSAL INJECTION;  Surgeon: [name], MD;  [facility];  Service: General;;   TONSILLECTOMY         Home Medications    Prior to Admission medications   Medication Sig Start Date End Date Taking? Authorizing Provider  predniSONE (DELTASONE) 20 MG tablet Take 2 tablets (40 mg total) by mouth daily with breakfast. 07/19/21  Yes Volney American, PA-C  aspirin EC 81 MG tablet Take 1 tablet (81 mg total) by mouth daily. 04/17/17   Herminio Commons, MD  atorvastatin (LIPITOR) 80 MG tablet Take 1 tablet (80 mg total) by mouth daily. 10/24/20   Vanessa Kick, MD  carvedilol (COREG) 3.125 MG tablet Take 1 tablet (3.125 mg total) by mouth 2 (two) times daily with a meal. 11/22/20   Noreene Larsson, NP  Cholecalciferol (VITAMIN D) 50 MCG (2000 UT) CAPS Take 1 capsule by mouth daily.    [provider]  clopidogrel (PLAVIX) 75 MG tablet Take 1 tablet (75 mg total) by mouth daily. 04/18/21   Lindell Spar, MD  ezetimibe (ZETIA) 10 MG tablet Take 1 tablet (10 mg total) by mouth daily. 04/17/21   Lindell Spar, MD  hydrochlorothiazide (HYDRODIURIL) 25 MG tablet Take 1 tablet (25 mg total) by mouth daily. 11/22/20   Noreene Larsson, NP  lisinopril (ZESTRIL) 20 MG tablet Take 1 tablet (20 mg total) by mouth daily. 01/22/21   Lindell Spar, MD  tadalafil (CIALIS) 5 MG tablet Take 1 tablet (5 mg total) by mouth daily as needed for erectile dysfunction. Patient not taking: Reported on 06/07/2021 01/07/21   Lindell Spar, MD    Family History Family History  Problem Relation Age of Onset   Cancer Mother    Breast cancer Mother        lived to be 51   CVA Mother    CAD Mother    Arthritis Mother    Hypertension Mother    Other Father        gunshot wound   Early  death Father        GSW   Cancer Brother        Stomach Cancer   Cancer Brother        Lung Cancer   Stroke Maternal Aunt    Alcohol abuse Maternal Uncle    Stroke Maternal Grandfather    Colon cancer Neg Hx     Social History Social History   Tobacco Use   Smoking status: Former    Packs/day: 0.50    Years: 29.00    Pack years: 14.50    Types: Cigarettes    Quit date: 2006    Years since quitting: 17.4   Smokeless tobacco: Never  Vaping Use   Vaping Use: Never used  Substance Use Topics   Alcohol use: Not Currently    Comment: quit 2006   Drug use:  Not Currently    Comment: remote h/o heavy marijuana use, also used cocaine and others     Allergies   Blood-group specific substance   Review of Systems Review of Systems Per HPI  Physical Exam Triage Vital Signs ED Triage Vitals  Enc Vitals Group     BP 07/19/21 0853 117/74     Pulse Rate 07/19/21 0853 70     Resp 07/19/21 0853 18     Temp 07/19/21 0853 98.5 F (36.9 C)     Temp Source 07/19/21 0853 Oral     SpO2 07/19/21 0853 96 %     Weight --      Height --      Head Circumference --      Peak Flow --      Pain Score 07/19/21 0851 8     Pain Loc --      Pain Edu? --      Excl. in [place]? --    No data found.  Updated Vital Signs BP 117/74 (BP Location: Right Arm)   Pulse 70   Temp 98.5 F (36.9 C) (Oral)   Resp 18   SpO2 96%   Visual Acuity Right Eye Distance:   Left Eye Distance:   Bilateral Distance:    Right Eye Near:   Left Eye Near:    Bilateral Near:     Physical Exam Vitals and nursing note reviewed.  Constitutional:      Appearance: Normal appearance.  HENT:     Head: Atraumatic.  Eyes:     Extraocular Movements: Extraocular movements intact.     Conjunctiva/sclera: Conjunctivae normal.  Cardiovascular:     Rate and Rhythm: Normal rate and regular rhythm.  Pulmonary:     Effort: Pulmonary effort is normal.     Breath sounds: Normal breath sounds.  Musculoskeletal:         General: Swelling and tenderness present. No deformity or signs of injury. Normal range of motion.     Cervical back: Normal range of motion and neck supple.     Comments: Left distal foot at base of toes erythematous, edematous, warm to the touch, significantly tender to palpation.  Range of motion intact  Skin:    General: Skin is warm and dry.     Findings: Erythema present.  Neurological:     General: No focal deficit present.     Mental Status: He is oriented to person, place, and time.     Comments: Left foot neurovascularly intact  Psychiatric:        Mood and Affect: Mood normal.        Thought Content: Thought content normal.        Judgment: Judgment normal.     UC Treatments / Results  Labs (all labs ordered are listed, but only abnormal results are displayed) Labs Reviewed - No data to display  EKG   Radiology No results found.  Procedures Procedures (including critical care time)  Medications Ordered in UC Medications - No data to display  Initial Impression / Assessment and Plan / UC Course  I have reviewed the triage vital signs and the nursing notes.  Pertinent labs & imaging results that were available during my care of the patient were reviewed by me and considered in my medical decision making (see chart for details).     Suspect gout, declines lab work today for confirmation.  Trial course of prednisone, Epsom salt soaks, leg elevation, over-the-counter pain relievers.  Return for worsening symptoms.  Discussed dietary changes and tart cherry supplements.  Final Clinical Impressions(s) / UC Diagnoses   Final diagnoses:  Left foot pain   Discharge Instructions   None    ED Prescriptions     Medication Sig Dispense Auth. Provider   predniSONE (DELTASONE) 20 MG tablet Take 2 tablets (40 mg total) by mouth daily with breakfast. 10 tablet Volney American, [place]      PDMP not reviewed this encounter.   Merrie Roof [place],  [place] 07/19/21 [phone]

## 2021-07-22 ENCOUNTER — Encounter: Payer: Self-pay | Admitting: Family Medicine

## 2021-07-22 ENCOUNTER — Ambulatory Visit (INDEPENDENT_AMBULATORY_CARE_PROVIDER_SITE_OTHER): Payer: Medicare HMO | Admitting: Family Medicine

## 2021-07-22 VITALS — BP 118/80 | HR 88 | Ht 75.0 in | Wt 199.2 lb

## 2021-07-22 DIAGNOSIS — M1000 Idiopathic gout, unspecified site: Secondary | ICD-10-CM

## 2021-07-22 DIAGNOSIS — I10 Essential (primary) hypertension: Secondary | ICD-10-CM

## 2021-07-22 NOTE — Patient Instructions (Signed)
I appreciate the opportunity to provide care to you today!    Continue taking your blood pressure medication as indicated   Please continue to a heart-healthy diet and increase your physical activities. Try to exercise for 73mns at least three times a week.      It was a pleasure to see you and I look forward to continuing to work together on your health and well-being. Please do not hesitate to call the office if you need care or have questions about your care.   Have a wonderful day and week. With Gratitude, GAlvira MondayMSN, FNP-BC

## 2021-07-22 NOTE — Assessment & Plan Note (Signed)
-  Elevated BP due to side effects from prednisone -Advised pt to stop taking prednisone and monitor BP -He reports not taking prednisone today -BP initially upon visit was 144/84 -BP before leaving the clinic is 118/80 -Given that his gout symptoms have subsided, we will monitor and treat him as needed if he has any complaints - Educated pt on low purine diet for gout

## 2021-07-22 NOTE — Progress Notes (Addendum)
Established Patient Office Visit  Subjective:  Patient ID: Albert Morris, male    DOB: 12-22-1950  Age: 71 y.o. MRN: [id number]  CC:  Chief Complaint  Patient presents with   Hypertension    Pt states he was treated at the urgent care for gout, was given prednisone which caused his bp to spike up.     HPI Albert Morris is a 71 y.o. male with past medical history of essential hypertension and dyslipidemia presents with complaints of elevated BP today, 07/22/21. He reported being seen in the ED on 07/19/21 for an acute gout flair-up and was given prednisone 27m daily. He reported taking the medication on 07/20/21 and 07/21/21 and noticed an increased BP today. His ambulatory readings are 158/84 and 186/79, accompanied by symptoms of headaches and dizziness. He has taken 2 tablets of Tylenol 6574mto ease his headaches. He reports stopping treatment since noticing his elevated BP. Swelling, warmth, and redness have subsided in his left foot.   Past Medical History:  Diagnosis Date   Cerebellar stroke ([facility]   Admitted with dizziness and "falling to the left" 06/24/16. Work up revealed multifocal acute ischemia within the right cerebellar hemisphere with occlusion versus severe stenosis of the right PICA and extensive, multifocal intracranial atherosclerosis with moderate stenoses of multiple distal MCA branches.   Colon polyps    CVA (cerebral vascular accident) ([facility]/06/2016   Diabetes mellitus without complication ([facility]0926/71/2458 Hyperlipidemia    Hypertension    Myocardial infarction ([facility]   Pneumonia    Substance abuse ([facility]   hx of EtOH and MJ    Past Surgical History:  Procedure Laterality Date   CAROTID ENDARTERECTOMY Left    COLON SURGERY  [id number] growth removed, Laparoscopic Vs. open Left hemicolectomy Dr. WiLovena Neighbours-9-19   COLONOSCOPY N/A 06/26/2017   Procedure: COLONOSCOPY;  Surgeon: ReRogene HoustonMD;  [facility];  Service: Endoscopy;   Laterality: N/A;  730   COLONOSCOPY N/A 12/01/2018   Procedure: COLONOSCOPY;  Surgeon: ReRogene HoustonMD;  [facility];  Service: Endoscopy;  Laterality: N/A;  930   COLONOSCOPY WITH PROPOFOL N/A 10/07/2017   Procedure: COLONOSCOPY WITH PROPOFOL WITH TATTOO;  Surgeon: WhIleana RoupMD;  [facility];  Service: General;  Laterality: N/A;   ENDARTERECTOMY Left 08/01/2016   Procedure: ENDARTERECTOMY LEFT CAROTID;  Surgeon: DiAngelia MouldMD;  [place];  Laterality: Left;   FLEXIBLE SIGMOIDOSCOPY N/A 10/08/2017   Procedure: FLEXIBLE SIGMOIDOSCOPY;  Surgeon: WhIleana RoupMD;  [facility];  Service: General;  Laterality: N/A;   LAPAROSCOPIC RIGHT HEMI COLECTOMY Left 10/08/2017   Procedure: LAPAROSCOPIC VERSES OPEN LEFT HEMI COLECTOMY ERAS PATHWAY;  Surgeon: WhIleana RoupMD;  [facility];  Service: General;  Laterality: Left;   PATCH ANGIOPLASTY Left 08/01/2016   Procedure: PATCH ANGIOPLASTY LEFT CAROTID ARTERY USING ZENOSURE BIOLOGIC PATCH;  Surgeon: DiAngelia MouldMD;  [facility];  Service: Vascular;  Laterality: Left;   POLYPECTOMY  12/01/2018   Procedure: POLYPECTOMY;  Surgeon: ReRogene HoustonMD;  [facility];  Service: Endoscopy;;   SUBMUCOSAL INJECTION  10/07/2017   Procedure: SUBMUCOSAL INJECTION;  Surgeon: WhIleana RoupMD;  [facility];  Service: General;;   TONSILLECTOMY      Family History  Problem Relation Age of Onset   Cancer Mother    Breast cancer Mother  lived to be 58   CVA Mother    CAD Mother    Arthritis Mother    Hypertension Mother    Other Father        gunshot wound   Early death Father        GSW   Cancer Brother        Stomach Cancer   Cancer Brother        Lung Cancer   Stroke Maternal Aunt    Alcohol abuse Maternal Uncle    Stroke Maternal Grandfather    Colon cancer Neg Hx     Social History    Socioeconomic History   Marital status: Divorced    Spouse name: Not on file   Number of children: 2   Years of education: 12   Highest education level: Not on file  Occupational History   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; 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[place], [place], [place], [place], [place], [place], [place], [place]    Comment: alcoholic  Tobacco Use   Smoking status: Former    Packs/day: 0.50    Years: 29.00    Pack years: 14.50    Types: Cigarettes    Quit date: 2006    Years since quitting: 17.4   Smokeless tobacco: Never  Vaping Use   Vaping Use: Never used  Substance and Sexual Activity   Alcohol use: Not Currently    Comment: quit 2006   Drug use: Not Currently    Comment: remote h/o heavy marijuana use, also used cocaine and others   Sexual activity: Not Currently  Other Topics Concern   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, EMT certified      Lives alone   Likes to walk      2 sons -in [place] and [place] Determinants of Health   Financial Resource Strain: Low Risk    Difficulty of Paying Living Expenses: Not hard at all  Food Insecurity: No Food Insecurity   Worried About Charity fundraiser in the Last Year: Never true   Arboriculturist in the Last Year: Never true  Transportation Needs: No Transportation Needs   Lack of Transportation (Medical): No   Lack of Transportation (Non-Medical): No  Physical Activity: Sufficiently Active   Days of Exercise per Week: 7 days   Minutes of Exercise per Session: 30 min  Stress: No Stress Concern Present   Feeling of Stress : Not at all  Social Connections: Moderately Isolated   Frequency of Communication with Friends and Family: More than three times a week   Frequency of Social Gatherings with Friends and Family: Twice a week   Attends Religious Services: More than 4 times per year   Active Member of [employer] or Organizations: No   Attends Archivist Meetings: Never    Marital Status: Divorced  Human resources officer Violence: Not At Risk   Fear of Current or Ex-Partner: No   Emotionally Abused: No   Physically Abused: No   Sexually Abused: No    Outpatient Medications Prior to Visit  Medication Sig Dispense Refill   aspirin EC 81 MG tablet Take 1 tablet (81 mg total) by mouth daily. 90 tablet 3   atorvastatin (LIPITOR) 80 MG tablet Take 1 tablet (80 mg total) by mouth daily. 90 tablet 1   carvedilol (COREG) 3.125 MG tablet Take 1 tablet (3.125 mg total) by mouth 2 (two) times  daily with a meal. 180 tablet 3   Cholecalciferol (VITAMIN D) 50 MCG (2000 UT) CAPS Take 1 capsule by mouth daily.     clopidogrel (PLAVIX) 75 MG tablet Take 1 tablet (75 mg total) by mouth daily. 90 tablet 1   ezetimibe (ZETIA) 10 MG tablet Take 1 tablet (10 mg total) by mouth daily. 90 tablet 3   hydrochlorothiazide (HYDRODIURIL) 25 MG tablet Take 1 tablet (25 mg total) by mouth daily. 90 tablet 3   lisinopril (ZESTRIL) 20 MG tablet Take 1 tablet (20 mg total) by mouth daily. 90 tablet 3   tadalafil (CIALIS) 5 MG tablet Take 1 tablet (5 mg total) by mouth daily as needed for erectile dysfunction. (Patient not taking: Reported on 06/07/2021) 30 tablet 2   predniSONE (DELTASONE) 20 MG tablet Take 2 tablets (40 mg total) by mouth daily with breakfast. (Patient not taking: Reported on 07/22/2021) 10 tablet 0   No facility-administered medications prior to visit.    Allergies  Allergen Reactions   Blood-Group Specific Substance     No whole blood products    ROS Review of Systems  Constitutional:  Negative for chills, fatigue and fever.  HENT:  Negative for rhinorrhea, sinus pressure and sinus pain.   Eyes:  Negative for pain and itching.  Respiratory:  Negative for chest tightness and shortness of breath.   Cardiovascular:  Negative for chest pain and palpitations.  Gastrointestinal:  Negative for constipation, nausea and vomiting.  Genitourinary:  Negative for frequency and  urgency.  Musculoskeletal:  Negative for back pain and neck pain.  Skin:  Negative for rash and wound.  Neurological:  Positive for dizziness and headaches.  Psychiatric/Behavioral:  Negative for self-injury, sleep disturbance and suicidal ideas.      Objective:    Physical Exam Constitutional:      Appearance: Normal appearance.  HENT:     Head: Normocephalic.     Right Ear: External ear normal.     Left Ear: External ear normal.  Cardiovascular:     Rate and Rhythm: Normal rate and regular rhythm.     Pulses: Normal pulses.     Heart sounds: Normal heart sounds.  Pulmonary:     Effort: Pulmonary effort is normal.     Breath sounds: Normal breath sounds.  Abdominal:     Palpations: Abdomen is soft.  Musculoskeletal:     Cervical back: No rigidity.     Right lower leg: No edema.     Left lower leg: No edema.     Right foot: No swelling or tenderness.     Left foot: No swelling or tenderness.  Neurological:     Mental Status: He is alert.    BP 118/80 (BP Location: Left Arm)   Pulse 88   Ht _0  (1.905 m)   Wt 199 lb 3.2 oz (90.4 kg)   SpO2 99%   BMI 24.90 kg/m  Wt Readings from Last 3 Encounters:  07/22/21 199 lb 3.2 oz (90.4 kg)  06/07/21 202 lb (91.6 kg)  05/08/21 204 lb 12.8 oz (92.9 kg)    Lab Results  Component Value Date   TSH 3.086 06/24/2016   Lab Results  Component Value Date   WBC 5.1 11/23/2020   HGB 12.2 (L) 11/23/2020   HCT 34.7 (L) 11/23/2020   MCV 84 11/23/2020   PLT 308 11/23/2020   Lab Results  Component Value Date   NA 140 04/30/2021   K 5.4 (H) 04/30/2021  CO2 24 04/30/2021   GLUCOSE 111 (H) 04/30/2021   BUN 25 04/30/2021   CREATININE 1.37 (H) 04/30/2021   BILITOT 0.6 11/23/2020   ALKPHOS 76 11/23/2020   AST 28 11/23/2020   ALT 26 11/23/2020   PROT 7.4 11/23/2020   ALBUMIN 4.3 11/23/2020   CALCIUM 9.8 04/30/2021   ANIONGAP 11 10/12/2017   EGFR 55 (L) 04/30/2021   Lab Results  Component Value Date   CHOL 174  11/23/2020   Lab Results  Component Value Date   HDL 45 11/23/2020   Lab Results  Component Value Date   LDLCALC 113 (H) 11/23/2020   Lab Results  Component Value Date   TRIG 86 11/23/2020   Lab Results  Component Value Date   CHOLHDL 4.1 05/21/2020   Lab Results  Component Value Date   HGBA1C 6.6 05/08/2021      Assessment & Plan:   Problem List Items Addressed This Visit       Cardiovascular and Mediastinum   Essential hypertension - Primary    -Elevated BP due to side effects from prednisone -Advised pt to stop taking prednisone and monitor BP -He reports not taking prednisone today -BP initially upon visit was 144/84 -BP before leaving the clinic is 118/80 -Given that his gout symptoms have subsided, we will monitor and treat him as needed if he has any complaints - Educated pt on low purine diet for gout       Other Visit Diagnoses     Acute idiopathic gout, unspecified site           No orders of the defined types were placed in this encounter.   Follow-up: No follow-ups on file.    Alvira Monday, FNP

## 2021-07-29 DIAGNOSIS — Z7982 Long term (current) use of aspirin: Secondary | ICD-10-CM | POA: Diagnosis not present

## 2021-07-29 DIAGNOSIS — N529 Male erectile dysfunction, unspecified: Secondary | ICD-10-CM | POA: Diagnosis not present

## 2021-07-29 DIAGNOSIS — I129 Hypertensive chronic kidney disease with stage 1 through stage 4 chronic kidney disease, or unspecified chronic kidney disease: Secondary | ICD-10-CM | POA: Diagnosis not present

## 2021-07-29 DIAGNOSIS — Z803 Family history of malignant neoplasm of breast: Secondary | ICD-10-CM | POA: Diagnosis not present

## 2021-07-29 DIAGNOSIS — E785 Hyperlipidemia, unspecified: Secondary | ICD-10-CM | POA: Diagnosis not present

## 2021-07-29 DIAGNOSIS — I252 Old myocardial infarction: Secondary | ICD-10-CM | POA: Diagnosis not present

## 2021-07-29 DIAGNOSIS — N1831 Chronic kidney disease, stage 3a: Secondary | ICD-10-CM | POA: Diagnosis not present

## 2021-07-29 DIAGNOSIS — Z7902 Long term (current) use of antithrombotics/antiplatelets: Secondary | ICD-10-CM | POA: Diagnosis not present

## 2021-07-29 DIAGNOSIS — I739 Peripheral vascular disease, unspecified: Secondary | ICD-10-CM | POA: Diagnosis not present

## 2021-09-04 DIAGNOSIS — Z125 Encounter for screening for malignant neoplasm of prostate: Secondary | ICD-10-CM | POA: Diagnosis not present

## 2021-09-04 DIAGNOSIS — E1159 Type 2 diabetes mellitus with other circulatory complications: Secondary | ICD-10-CM | POA: Diagnosis not present

## 2021-09-04 DIAGNOSIS — I10 Essential (primary) hypertension: Secondary | ICD-10-CM | POA: Diagnosis not present

## 2021-09-04 DIAGNOSIS — E559 Vitamin D deficiency, unspecified: Secondary | ICD-10-CM | POA: Diagnosis not present

## 2021-09-04 DIAGNOSIS — E782 Mixed hyperlipidemia: Secondary | ICD-10-CM | POA: Diagnosis not present

## 2021-09-05 LAB — CMP14+EGFR
ALT: 20 IU/L (ref 0–44)
AST: 19 IU/L (ref 0–40)
Albumin/Globulin Ratio: 1.7 (ref 1.2–2.2)
Albumin: 4.6 g/dL (ref 3.8–4.8)
Alkaline Phosphatase: 86 IU/L (ref 44–121)
BUN/Creatinine Ratio: 17 (ref 10–24)
BUN: 23 mg/dL (ref 8–27)
Bilirubin Total: 0.4 mg/dL (ref 0.0–1.2)
CO2: 23 mmol/L (ref 20–29)
Calcium: 9.8 mg/dL (ref 8.6–10.2)
Chloride: 97 mmol/L (ref 96–106)
Creatinine, Ser: 1.36 mg/dL — ABNORMAL HIGH (ref 0.76–1.27)
Globulin, Total: 2.7 g/dL (ref 1.5–4.5)
Glucose: 108 mg/dL — ABNORMAL HIGH (ref 70–99)
Potassium: 5.1 mmol/L (ref 3.5–5.2)
Sodium: 134 mmol/L (ref 134–144)
Total Protein: 7.3 g/dL (ref 6.0–8.5)
eGFR: 56 mL/min/{1.73_m2} — ABNORMAL LOW (ref >59)

## 2021-09-05 LAB — CBC WITH DIFFERENTIAL/PLATELET
Basophils Absolute: 0 10*3/uL (ref 0.0–0.2)
Basos: 1 %
EOS (ABSOLUTE): 0.4 10*3/uL (ref 0.0–0.4)
Eos: 8 %
Hematocrit: 37.5 % (ref 37.5–51.0)
Hemoglobin: 12.7 g/dL — ABNORMAL LOW (ref 13.0–17.7)
Immature Grans (Abs): 0 10*3/uL (ref 0.0–0.1)
Immature Granulocytes: 0 %
Lymphocytes Absolute: 1.4 10*3/uL (ref 0.7–3.1)
Lymphs: 26 %
MCH: 29.3 pg (ref 26.6–33.0)
MCHC: 33.9 g/dL (ref 31.5–35.7)
MCV: 86 fL (ref 79–97)
Monocytes Absolute: 0.6 10*3/uL (ref 0.1–0.9)
Monocytes: 11 %
Neutrophils Absolute: 3.1 10*3/uL (ref 1.4–7.0)
Neutrophils: 54 %
Platelets: 317 10*3/uL (ref 150–450)
RBC: 4.34 x10E6/uL (ref 4.14–5.80)
RDW: 12.8 % (ref 11.6–15.4)
WBC: 5.6 10*3/uL (ref 3.4–10.8)

## 2021-09-05 LAB — TSH: TSH: 1.49 u[IU]/mL (ref 0.450–4.500)

## 2021-09-05 LAB — LIPID PANEL
Chol/HDL Ratio: 3.3 ratio (ref 0.0–5.0)
Cholesterol, Total: 159 mg/dL (ref 100–199)
HDL: 48 mg/dL (ref >39)
LDL Chol Calc (NIH): 98 mg/dL (ref 0–99)
Triglycerides: 66 mg/dL (ref 0–149)
VLDL Cholesterol Cal: 13 mg/dL (ref 5–40)

## 2021-09-05 LAB — HEMOGLOBIN A1C
Est. average glucose Bld gHb Est-mCnc: 137 mg/dL
Hgb A1c MFr Bld: 6.4 % — ABNORMAL HIGH (ref 4.8–5.6)

## 2021-09-05 LAB — VITAMIN D 25 HYDROXY (VIT D DEFICIENCY, FRACTURES): Vit D, 25-Hydroxy: 66 ng/mL (ref 30.0–100.0)

## 2021-09-05 LAB — PSA: Prostate Specific Ag, Serum: 1.5 ng/mL (ref 0.0–4.0)

## 2021-09-11 ENCOUNTER — Ambulatory Visit (INDEPENDENT_AMBULATORY_CARE_PROVIDER_SITE_OTHER): Payer: Medicare HMO | Admitting: Internal Medicine

## 2021-09-11 ENCOUNTER — Encounter: Payer: Self-pay | Admitting: Internal Medicine

## 2021-09-11 VITALS — BP 132/78 | HR 60 | Resp 18 | Ht 75.0 in | Wt 196.0 lb

## 2021-09-11 DIAGNOSIS — Z8673 Personal history of transient ischemic attack (TIA), and cerebral infarction without residual deficits: Secondary | ICD-10-CM | POA: Diagnosis not present

## 2021-09-11 DIAGNOSIS — I5032 Chronic diastolic (congestive) heart failure: Secondary | ICD-10-CM

## 2021-09-11 DIAGNOSIS — E1159 Type 2 diabetes mellitus with other circulatory complications: Secondary | ICD-10-CM | POA: Diagnosis not present

## 2021-09-11 DIAGNOSIS — Z0001 Encounter for general adult medical examination with abnormal findings: Secondary | ICD-10-CM | POA: Diagnosis not present

## 2021-09-11 DIAGNOSIS — N1831 Chronic kidney disease, stage 3a: Secondary | ICD-10-CM

## 2021-09-11 DIAGNOSIS — E782 Mixed hyperlipidemia: Secondary | ICD-10-CM | POA: Diagnosis not present

## 2021-09-11 DIAGNOSIS — I25118 Atherosclerotic heart disease of native coronary artery with other forms of angina pectoris: Secondary | ICD-10-CM

## 2021-09-11 MED ORDER — ATORVASTATIN CALCIUM 80 MG PO TABS: 80.0000 mg | ORAL_TABLET | Freq: Every day | ORAL | 3 refills | Status: DC

## 2021-09-11 MED ORDER — DAPAGLIFLOZIN PROPANEDIOL 5 MG PO TABS: 5.0000 mg | ORAL_TABLET | Freq: Every day | ORAL | 0 refills | Status: DC

## 2021-09-11 NOTE — Assessment & Plan Note (Signed)
On aspirin, Plavix and statin No residual neurologic deficits currently

## 2021-09-11 NOTE — Assessment & Plan Note (Signed)
Lab Results  Component Value Date   HGBA1C 6.4 (H) 09/04/2021   Diet controlled Added Wilder Glade for HFpEF and CKD Advised to follow diabetic diet On statin and ACEi F/u CMP and lipid panel Diabetic eye exam: Advised to follow up with Ophthalmology for diabetic eye exam

## 2021-09-11 NOTE — Assessment & Plan Note (Signed)
Likely from HTN, DM and age related decline On lisinopril and HCTZ for HTN Added Farxiga for CKD and HFpEF Avoid nephrotoxic agents Advised to improve fluid intake to at least 64 ounces of fluid in a day 

## 2021-09-11 NOTE — Patient Instructions (Signed)
Please start taking Farxiga as prescribed.  Please continue taking other medications as prescribed.  Please continue to follow low carb diet and ambulate as tolerated.

## 2021-09-11 NOTE — Assessment & Plan Note (Signed)
Last Echo reviewed On ACEi and B-blocker Added Farxiga

## 2021-09-11 NOTE — Assessment & Plan Note (Signed)

## 2021-09-11 NOTE — Progress Notes (Signed)
Established Patient Office Visit  Subjective:  Patient ID: Albert Morris, male    DOB: 1950-05-02  Age: 71 y.o. MRN: [id number]  CC:  Chief Complaint  Patient presents with   Annual Exam    Annual exam     HPI Albert Morris is a 71 y.o. male with past medical history of HTN, CAD, type II DM with HLD, CVA and tobacco abuse who presents for annual physical.  HTN: BP is well-controlled. Takes medications regularly. Patient denies headache, dizziness, chest pain, dyspnea or palpitations.   HLD: He has been taking Lipitor and Zetia now, but not taking Lipitor regularly.   Type 2 DM: His HbA1C is stable at 6.4. Diet controlled. Does not prefer to take medicine for it. Denies any polyuria or polyphagia.     Past Medical History:  Diagnosis Date   Cerebellar stroke ([place]) 06/24/2016   Admitted with dizziness and "falling to the left" 06/24/16. Work up revealed multifocal acute ischemia within the right cerebellar hemisphere with occlusion versus severe stenosis of the right PICA and extensive, multifocal intracranial atherosclerosis with moderate stenoses of multiple distal MCA branches.   Colon polyps    CVA (cerebral vascular accident) ([place]) 06/21/2016   Diabetes mellitus without complication ([place]) 59/93/5701   Hyperlipidemia    Hypertension    Myocardial infarction ([place])    Pneumonia    Substance abuse ([place])    hx of EtOH and MJ    Past Surgical History:  Procedure Laterality Date   CAROTID ENDARTERECTOMY Left    COLON SURGERY  [phone]   growth removed, Laparoscopic Vs. open Left hemicolectomy Dr. Lovena Neighbours 09-25-17   COLONOSCOPY N/A 06/26/2017   Procedure: COLONOSCOPY;  Surgeon: [name], MD;  [facility];  Service: Endoscopy;  Laterality: N/A;  730   COLONOSCOPY N/A 12/01/2018   Procedure: COLONOSCOPY;  Surgeon: [name], MD;  [facility];  Service: Endoscopy;  Laterality: N/A;  930   COLONOSCOPY WITH PROPOFOL N/A 10/07/2017    Procedure: COLONOSCOPY WITH PROPOFOL WITH TATTOO;  Surgeon: [name], MD;  Location: [facility];  Service: General;  Laterality: N/A;   ENDARTERECTOMY Left 08/01/2016   Procedure: ENDARTERECTOMY LEFT CAROTID;  Surgeon: [name], MD;  Location: [facility];  Service: Vascular;  Laterality: Left;   FLEXIBLE SIGMOIDOSCOPY N/A 10/08/2017   Procedure: FLEXIBLE SIGMOIDOSCOPY;  Surgeon: [name], MD;  Location: [facility];  Service: General;  Laterality: N/A;   LAPAROSCOPIC RIGHT HEMI COLECTOMY Left 10/08/2017   Procedure: LAPAROSCOPIC VERSES OPEN LEFT HEMI COLECTOMY ERAS PATHWAY;  Surgeon: [name], MD;  Location: [facility];  Service: General;  Laterality: Left;   PATCH ANGIOPLASTY Left 08/01/2016   Procedure: PATCH ANGIOPLASTY LEFT CAROTID ARTERY USING ZENOSURE BIOLOGIC PATCH;  Surgeon: [name], MD;  Location: [facility];  Service: Vascular;  Laterality: Left;   POLYPECTOMY  12/01/2018   Procedure: POLYPECTOMY;  Surgeon: [name], MD;  [facility];  Service: Endoscopy;;   SUBMUCOSAL INJECTION  10/07/2017   Procedure: SUBMUCOSAL INJECTION;  Surgeon: [name], MD;  Location: [facility];  Service: General;;   TONSILLECTOMY      Family History  Problem Relation Age of Onset   Cancer Mother    Breast cancer Mother        lived to be 68   CVA Mother    CAD Mother    Arthritis Mother    Hypertension Mother    Other  Father        gunshot wound   Early death Father        GSW   Cancer Brother        Stomach Cancer   Cancer Brother        Lung Cancer   Stroke Maternal Aunt    Alcohol abuse Maternal Uncle    Stroke Maternal Grandfather    Colon cancer Neg Hx     Social History   Socioeconomic History   Marital status: Divorced    Spouse name: Not on file   Number of children: 2   Years of education: 12   Highest education level: Not on file  Occupational History   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;  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[place], [place], [place], [place], [place], [place], [place], [place]: alcoholic  Tobacco Use   Smoking status: Former    Packs/day: 0.50    Years: 29.00    Total pack years: 14.50    Types: Cigarettes    Quit date: 2006    Years since quitting: 17.5   Smokeless tobacco: Never  Vaping Use   Vaping Use: Never used  Substance and Sexual Activity   Alcohol use: Not Currently    Comment: quit 2006   Drug use: Not Currently    Comment: remote h/o heavy marijuana use, also used cocaine and others   Sexual activity: Not Currently  Other Topics Concern   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, EMT certified      Lives alone   Likes to walk      2 sons -in [place] and [place] Determinants of Health   Financial Resource Strain: [place]  (02/25/2021)   Overall Financial Resource Strain (CARDIA)    Difficulty of Paying Living Expenses: Not hard at all  Food Insecurity: No Food Insecurity (02/25/2021)   Hunger Vital Sign    Worried About Running Out of Food in the Last Year: Never true    [place] in the Last Year: Never true  Transportation Needs: No Transportation Needs (02/25/2021)   PRAPARE - Hydrologist (Medical): No    Lack of Transportation (Non-Medical): No  Physical Activity: Sufficiently Active (02/25/2021)   Exercise Vital Sign    Days of Exercise per Week: 7 days    Minutes of Exercise per Session: 30 min  Stress: No Stress Concern Present (02/25/2021)   [place]    Feeling of Stress : Not at all  Social Connections: Moderately Isolated (02/25/2021)   Social Connection and Isolation Panel [NHANES]    Frequency of Communication with Friends and Family: More than three times a week    Frequency of Social Gatherings with Friends and Family: Twice a week    Attends Religious Services: More than 4 times per year     Active Member of [employer] or Organizations: No    Attends Archivist Meetings: Never    Marital Status: Divorced  Human resources officer Violence: Not At Risk (02/25/2021)   Humiliation, Afraid, Rape, and Kick questionnaire    Fear of Current or Ex-Partner: No    Emotionally Abused: No    Physically Abused: No    Sexually Abused: No    Outpatient Medications Prior to Visit  Medication Sig Dispense Refill   aspirin EC 81 MG tablet Take  1 tablet (81 mg total) by mouth daily. 90 tablet 3   carvedilol (COREG) 3.125 MG tablet Take 1 tablet (3.125 mg total) by mouth 2 (two) times daily with a meal. 180 tablet 3   Cholecalciferol (VITAMIN D) 50 MCG (2000 UT) CAPS Take 1 capsule by mouth daily.     clopidogrel (PLAVIX) 75 MG tablet Take 1 tablet (75 mg total) by mouth daily. 90 tablet 1   ezetimibe (ZETIA) 10 MG tablet Take 1 tablet (10 mg total) by mouth daily. 90 tablet 3   hydrochlorothiazide (HYDRODIURIL) 25 MG tablet Take 1 tablet (25 mg total) by mouth daily. 90 tablet 3   lisinopril (ZESTRIL) 20 MG tablet Take 1 tablet (20 mg total) by mouth daily. 90 tablet 3   tadalafil (CIALIS) 5 MG tablet Take 1 tablet (5 mg total) by mouth daily as needed for erectile dysfunction. 30 tablet 2   atorvastatin (LIPITOR) 80 MG tablet Take 1 tablet (80 mg total) by mouth daily. 90 tablet 1   No facility-administered medications prior to visit.    Allergies  Allergen Reactions   Blood-Group Specific Substance     No whole blood products    ROS Review of Systems  Constitutional:  Negative for chills and fever.  HENT:  Negative for congestion and sore throat.   Eyes:  Negative for pain and discharge.  Respiratory:  Negative for cough and shortness of breath.   Cardiovascular:  Negative for chest pain and palpitations.  Gastrointestinal:  Negative for constipation, diarrhea, nausea and vomiting.  Endocrine: Negative for polydipsia and polyuria.  Genitourinary:  Negative for dysuria and hematuria.   Musculoskeletal:  Negative for neck pain and neck stiffness.  Skin:  Negative for rash.  Neurological:  Negative for dizziness, weakness, numbness and headaches.  Psychiatric/Behavioral:  Negative for agitation and behavioral problems.       Objective:    Physical Exam Vitals reviewed.  Constitutional:      General: He is not in acute distress.    Appearance: He is not diaphoretic.  HENT:     Head: Normocephalic and atraumatic.     Nose: Nose normal.     Mouth/Throat:     Mouth: Mucous membranes are moist.  Eyes:     General: No scleral icterus.    Extraocular Movements: Extraocular movements intact.  Cardiovascular:     Rate and Rhythm: Normal rate and regular rhythm.     Pulses: Normal pulses.     Heart sounds: Normal heart sounds. No murmur heard. Pulmonary:     Breath sounds: Normal breath sounds. No wheezing or rales.  Abdominal:     Palpations: Abdomen is soft.     Tenderness: There is no abdominal tenderness.  Musculoskeletal:     Cervical back: Neck supple. No tenderness.     Right lower leg: No edema.     Left lower leg: No edema.  Skin:    General: Skin is warm.     Findings: No rash.  Neurological:     General: No focal deficit present.     Mental Status: He is alert and oriented to person, place, and time.     Cranial Nerves: No cranial nerve deficit.     Sensory: No sensory deficit.     Motor: No weakness.  Psychiatric:        Mood and Affect: Mood normal.        Behavior: Behavior normal.     BP 132/78 (BP Location: Right Arm, Patient  Position: Sitting, Cuff Size: Normal)   Pulse 60   Resp 18   Ht _0  (1.905 m)   Wt 196 lb (88.9 kg)   SpO2 98%   BMI 24.50 kg/m  Wt Readings from Last 3 Encounters:  09/11/21 196 lb (88.9 kg)  07/22/21 199 lb 3.2 oz (90.4 kg)  06/07/21 202 lb (91.6 kg)    Lab Results  Component Value Date   TSH 1.490 09/04/2021   Lab Results  Component Value Date   WBC 5.6 09/04/2021   HGB 12.7 (L) 09/04/2021    HCT 37.5 09/04/2021   MCV 86 09/04/2021   PLT 317 09/04/2021   Lab Results  Component Value Date   NA 134 09/04/2021   K 5.1 09/04/2021   CO2 23 09/04/2021   GLUCOSE 108 (H) 09/04/2021   BUN 23 09/04/2021   CREATININE 1.36 (H) 09/04/2021   BILITOT 0.4 09/04/2021   ALKPHOS 86 09/04/2021   AST 19 09/04/2021   ALT 20 09/04/2021   PROT 7.3 09/04/2021   ALBUMIN 4.6 09/04/2021   CALCIUM 9.8 09/04/2021   ANIONGAP 11 10/12/2017   EGFR 56 (L) 09/04/2021   Lab Results  Component Value Date   CHOL 159 09/04/2021   Lab Results  Component Value Date   HDL 48 09/04/2021   Lab Results  Component Value Date   LDLCALC 98 09/04/2021   Lab Results  Component Value Date   TRIG 66 09/04/2021   Lab Results  Component Value Date   CHOLHDL 3.3 09/04/2021   Lab Results  Component Value Date   HGBA1C 6.4 (H) 09/04/2021      Assessment & Plan:   Problem List Items Addressed This Visit       Cardiovascular and Mediastinum   Coronary artery disease of native artery of native heart with stable angina pectoris ([place])    Followed by cardiology On aspirin, Plavix, statin and beta-blocker      Relevant Medications   atorvastatin (LIPITOR) 80 MG tablet   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[place])    Lab Results  Component Value Date   HGBA1C 6.4 (H) 09/04/2021  Diet controlled Added Farxiga for HFpEF and CKD Advised to follow diabetic diet On statin and ACEi F/u CMP and lipid panel Diabetic eye exam: Advised to follow up with Ophthalmology for diabetic eye exam      Relevant Medications   atorvastatin (LIPITOR) 80 MG tablet   dapagliflozin propanediol (FARXIGA) 5 MG TABS tablet   Other Relevant Orders   Hemoglobin A1c   Chronic heart failure with preserved ejection fraction ([place])    Last Echo reviewed On ACEi and B-blocker Added Farxiga      Relevant Medications   atorvastatin (LIPITOR) 80 MG tablet   dapagliflozin propanediol (FARXIGA) 5 MG  TABS tablet     Genitourinary   Stage 3a chronic kidney disease ([place])    Likely from HTN, DM and age related decline On lisinopril and HCTZ for HTN Added Farxiga for CKD and HFpEF Avoid nephrotoxic agents Advised to improve fluid intake to at least 64 ounces of fluid in a day      Relevant Medications   dapagliflozin propanediol (FARXIGA) 5 MG TABS tablet   Other Relevant Orders   Basic Metabolic Panel (BMET)     Other   History of CVA (cerebrovascular accident)    On aspirin, Plavix and statin No residual neurologic deficits currently      Relevant Medications   atorvastatin (  LIPITOR) 80 MG tablet   Mixed hyperlipidemia    Lipid profile reviewed On Zetia Continue Lipitor 80 mg every other day (has leg cramps with daily dosing) - needs to take it regularly      Relevant Medications   atorvastatin (LIPITOR) 80 MG tablet   Encounter for general adult medical examination with abnormal findings - Primary    Physical exam as documented. Counseling done  re healthy lifestyle involving commitment to 150 minutes exercise per week, heart healthy diet, and attaining healthy weight.The importance of adequate sleep also discussed. Changes in health habits are decided on by the patient with goals and time frames  set for achieving them. Immunization and cancer screening needs are specifically addressed at this visit.       Meds ordered this encounter  Medications   atorvastatin (LIPITOR) 80 MG tablet    Sig: Take 1 tablet (80 mg total) by mouth daily.    Dispense:  90 tablet    Refill:  3   dapagliflozin propanediol (FARXIGA) 5 MG TABS tablet    Sig: Take 1 tablet (5 mg total) by mouth daily before breakfast.    Dispense:  30 tablet    Refill:  0    Follow-up: Return in about 4 months (around 01/12/2022) for DM and CKD.    Lindell Spar, MD

## 2021-09-11 NOTE — Assessment & Plan Note (Signed)
Followed by cardiology On aspirin, Plavix, statin and beta-blocker

## 2021-09-11 NOTE — Assessment & Plan Note (Signed)
Lipid profile reviewed On Zetia Continue Lipitor 80 mg every other day (has leg cramps with daily dosing) - needs to take it regularly 

## 2021-10-07 ENCOUNTER — Ambulatory Visit: Payer: Medicare HMO | Admitting: Podiatry

## 2021-10-11 ENCOUNTER — Other Ambulatory Visit: Payer: Self-pay | Admitting: Internal Medicine

## 2021-10-18 ENCOUNTER — Ambulatory Visit: Payer: Medicare HMO | Admitting: Podiatry

## 2021-10-18 ENCOUNTER — Encounter: Payer: Self-pay | Admitting: Podiatry

## 2021-10-18 DIAGNOSIS — M79675 Pain in left toe(s): Secondary | ICD-10-CM | POA: Diagnosis not present

## 2021-10-18 DIAGNOSIS — N1831 Chronic kidney disease, stage 3a: Secondary | ICD-10-CM

## 2021-10-18 DIAGNOSIS — B351 Tinea unguium: Secondary | ICD-10-CM

## 2021-10-18 DIAGNOSIS — M79674 Pain in right toe(s): Secondary | ICD-10-CM | POA: Diagnosis not present

## 2021-10-18 DIAGNOSIS — E119 Type 2 diabetes mellitus without complications: Secondary | ICD-10-CM | POA: Diagnosis not present

## 2021-10-18 NOTE — Progress Notes (Signed)
This patient returns to my office for at risk foot care.  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  Patient is taking plavix.  This patient is unable to cut nails himself since the patient cannot reach his nails.These nails are painful walking and wearing shoes.  This patient presents for at risk foot care today.  General Appearance  Alert, conversant and in no acute stress.  Vascular  Dorsalis pedis and posterior tibial  pulses are weakly  palpable  bilaterally.  Capillary return is within normal limits  bilaterally. Temperature is within normal limits  bilaterally.  Neurologic  Senn-Weinstein monofilament wire test within normal limits/diminished   bilaterally. Muscle power within normal limits bilaterally.  Nails Thick disfigured discolored nails with subungual debris  from hallux to fifth toes bilaterally. No evidence of bacterial infection or drainage bilaterally.  Orthopedic  No limitations of motion  feet .  No crepitus or effusions noted.  No bony pathology or digital deformities noted. HAV  B/L.  Skin  normotropic skin with no porokeratosis noted bilaterally.  No signs of infections or ulcers noted.     Onychomycosis  Pain in right toes  Pain in left toes  Consent was obtained for treatment procedures.   Mechanical debridement of nails 1-5  bilaterally performed with a nail nipper.  Filed with dremel without incident.    Return office visit   3 months                  Told patient to return for periodic foot care and evaluation due to potential at risk complications.   Jeidy Hoerner DPM  

## 2021-11-13 ENCOUNTER — Telehealth: Payer: Self-pay

## 2021-11-13 ENCOUNTER — Other Ambulatory Visit: Payer: Self-pay | Admitting: *Deleted

## 2021-11-13 ENCOUNTER — Ambulatory Visit
Admission: EM | Admit: 2021-11-13 | Discharge: 2021-11-13 | Disposition: A | Discharge status: Home / Self Care | Payer: Medicare HMO | Attending: Family Medicine | Admitting: Family Medicine

## 2021-11-13 ENCOUNTER — Other Ambulatory Visit: Payer: Self-pay

## 2021-11-13 ENCOUNTER — Encounter: Payer: Self-pay | Admitting: Emergency Medicine

## 2021-11-13 DIAGNOSIS — M79674 Pain in right toe(s): Secondary | ICD-10-CM | POA: Diagnosis not present

## 2021-11-13 DIAGNOSIS — I10 Essential (primary) hypertension: Secondary | ICD-10-CM

## 2021-11-13 MED ORDER — HYDROCHLOROTHIAZIDE 25 MG PO TABS: 25.0000 mg | ORAL_TABLET | Freq: Every day | ORAL | 3 refills | Status: DC

## 2021-11-13 MED ORDER — DICLOFENAC SODIUM 1 % EX GEL: 4.0000 g | Freq: Four times a day (QID) | CUTANEOUS | 0 refills | Status: DC

## 2021-11-13 NOTE — Telephone Encounter (Signed)
Patient came by the office need med refills  hydrochlorothiazide (HYDRODIURIL) 25 MG tablet   Pharmacy  [facility] [phone] - [place], [place] AT [place]  [address], [place] [address]  Phone:  [phone]  Fax:  [phone]  DEA #:  [id number]

## 2021-11-13 NOTE — ED Triage Notes (Signed)
Pt reports right great toe pain for last several days. Pt reports injured toe and had x-rays completed a few weeks ago and states xray was negative. Pt reports history of gout and states "this feels similar."

## 2021-11-13 NOTE — ED Provider Notes (Signed)
[facility]    CSN: [id number] Arrival date & time: 11/13/21  6578      History   Chief Complaint Chief Complaint  Patient presents with   Toe Pain    HPI Albert Morris is a 71 y.o. male.   Patient presenting today with 2 to 3-day history of pain, swelling, redness, warmth at the base of his right great toe.  He states has had similar issues in the past and has been gout.  Had x-rays several weeks ago to the area after an injury that were negative, states this feels a bit different than the pain at that time.  Taking Tylenol with minimal relief at this time.    Past Medical History:  Diagnosis Date   Cerebellar stroke ([place]) 06/24/2016   Admitted with dizziness and "falling to the left" 06/24/16. Work up revealed multifocal acute ischemia within the right cerebellar hemisphere with occlusion versus severe stenosis of the right PICA and extensive, multifocal intracranial atherosclerosis with moderate stenoses of multiple distal MCA branches.   Colon polyps    CVA (cerebral vascular accident) ([place]) 06/21/2016   Diabetes mellitus without complication ([place]) 46/96/2952   Hyperlipidemia    Hypertension    Myocardial infarction ([place])    Pneumonia    Substance abuse ([place])    hx of EtOH and MJ    Patient Active Problem List   Diagnosis Date Noted   Chronic heart failure with preserved ejection fraction ([place]) 09/11/2021   Encounter for general adult medical examination with abnormal findings 09/11/2021   Prostate cancer screening 05/08/2021   Stage 3a chronic kidney disease ([place]) 05/08/2021   Atherosclerosis of aorta ([place]) 01/07/2021   History of CVA (cerebrovascular accident) 01/07/2021   Mixed hyperlipidemia 01/07/2021   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   Hypogonadism in male 11/22/2020   Anemia 11/22/2020   Pain due to onychomycosis of toenails of both feet 12/14/2019   History of tobacco use 01/26/2019   Hx of colonic polyps 11/12/2018    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[place]) 11/11/2018   Colon polyp 10/08/2017   Positive colorectal cancer screening using Cologuard test 05/28/2017   Vitamin D deficiency 10/14/2016   History of carotid endarterectomy 10/14/2016   History of MI (myocardial infarction) 07/08/2016   History of substance abuse ([place]) 07/08/2016   Coronary artery disease of native artery of native heart with stable angina pectoris ([place])    Essential hypertension 07/07/2016   Stenosis of carotid artery    Cardiomyopathy [facility])     Past Surgical History:  Procedure Laterality Date   CAROTID ENDARTERECTOMY Left    COLON SURGERY  8413,2440   growth removed, Laparoscopic Vs. open Left hemicolectomy Dr. Lovena Neighbours 09-25-17   COLONOSCOPY N/A 06/26/2017   Procedure: COLONOSCOPY;  Surgeon: [name], MD;  [facility];  Service: Endoscopy;  Laterality: N/A;  730   COLONOSCOPY N/A 12/01/2018   Procedure: COLONOSCOPY;  Surgeon: [name], MD;  [facility];  Service: Endoscopy;  Laterality: N/A;  930   COLONOSCOPY WITH PROPOFOL N/A 10/07/2017   Procedure: COLONOSCOPY WITH PROPOFOL WITH TATTOO;  Surgeon: [name], MD;  Location[facility];  Service: General;  Laterality: N/A;   ENDARTERECTOMY Left 08/01/2016   Procedure: ENDARTERECTOMY LEFT CAROTID;  Surgeon: [name], MD;  Location: [place];  Service: Vascular;  Laterality: Left;   [place] N/A 10/08/2017   Procedure: FLEXIBLE SIGMOIDOSCOPY;  Surgeon: [name], MD;  Location: [facility];  Service: General;  Laterality: N/A;   LAPAROSCOPIC RIGHT HEMI COLECTOMY Left 10/08/2017   Procedure: LAPAROSCOPIC VERSES OPEN LEFT HEMI COLECTOMY ERAS PATHWAY;  Surgeon: [name], MD;  Location: [facility];  Service: General;  Laterality: Left;   PATCH ANGIOPLASTY Left 08/01/2016   Procedure: PATCH ANGIOPLASTY LEFT CAROTID ARTERY USING ZENOSURE BIOLOGIC PATCH;  Surgeon: [name], MD;  Location[facility];  Service: Vascular;  Laterality: Left;   POLYPECTOMY  12/01/2018   Procedure: POLYPECTOMY;  Surgeon: [name], MD;  [facility];  Service: Endoscopy;;   SUBMUCOSAL INJECTION  10/07/2017   Procedure: SUBMUCOSAL INJECTION;  Surgeon: [name], MD;  Location: [facility];  Service: General;;   TONSILLECTOMY         Home Medications    Prior to Admission medications   Medication Sig Start Date End Date Taking? Authorizing Provider  diclofenac Sodium (VOLTAREN) 1 % GEL Apply 4 g topically 4 (four) times daily. 11/13/21  Yes Volney American, PA-C  aspirin EC 81 MG tablet Take 1 tablet (81 mg total) by mouth daily. 04/17/17   Herminio Commons, MD  atorvastatin (LIPITOR) 80 MG tablet Take 1 tablet (80 mg total) by mouth daily. 09/11/21   Lindell Spar, MD  carvedilol (COREG) 3.125 MG tablet Take 1 tablet (3.125 mg total) by mouth 2 (two) times daily with a meal. 11/22/20   Noreene Larsson, NP  Cholecalciferol (VITAMIN D) 50 MCG (2000 UT) CAPS Take 1 capsule by mouth daily.    [provider]  clopidogrel (PLAVIX) 75 MG tablet TAKE 1 TABLET(75 MG) BY MOUTH DAILY 10/11/21   Lindell Spar, MD  dapagliflozin propanediol (FARXIGA) 5 MG TABS tablet Take 1 tablet (5 mg total) by mouth daily before breakfast. 09/11/21   Lindell Spar, MD  ezetimibe (ZETIA) 10 MG tablet Take 1 tablet (10 mg total) by mouth daily. 04/17/21   Lindell Spar, MD  hydrochlorothiazide (HYDRODIURIL) 25 MG tablet Take 1 tablet (25 mg total) by mouth daily. 11/13/21   Lindell Spar, MD  lisinopril (ZESTRIL) 20 MG tablet Take 1 tablet (20 mg total) by mouth daily. 01/22/21   Lindell Spar, MD  tadalafil (CIALIS) 5 MG tablet Take 1 tablet (5 mg total) by mouth daily as needed for erectile dysfunction. 01/07/21   Lindell Spar, MD    Family History Family History  Problem Relation Age of Onset   Cancer Mother    Breast cancer Mother         lived to be 75   CVA Mother    CAD Mother    Arthritis Mother    Hypertension Mother    Other Father        gunshot wound   Early death Father        GSW   Cancer Brother        Stomach Cancer   Cancer Brother        Lung Cancer   Stroke Maternal Aunt    Alcohol abuse Maternal Uncle    Stroke Maternal Grandfather    Colon cancer Neg Hx     Social History Social History   Tobacco Use   Smoking status: Former    Packs/day: 0.50    Years: 29.00    Total pack years: 14.50    Types: Cigarettes    Quit date: 2006    Years since quitting: 17.7   Smokeless tobacco: Never  Vaping Use   Vaping Use: Never used  Substance Use Topics   Alcohol use: Not Currently    Comment: quit 2006   Drug use: Not Currently    Comment: remote h/o heavy marijuana use, also used cocaine and others     Allergies   Blood-group specific substance   Review of Systems Review of Systems Per HPI  Physical Exam Triage Vital Signs ED Triage Vitals  Enc Vitals Group     BP 11/13/21 0834 125/76     Pulse Rate 11/13/21 0834 67     Resp 11/13/21 0834 20     Temp 11/13/21 0834 98.5 F (36.9 C)     Temp Source 11/13/21 0834 Oral     SpO2 11/13/21 0834 99 %     Weight --      Height --      Head Circumference --      Peak Flow --      Pain Score 11/13/21 0835 6     Pain Loc --      Pain Edu? --      Excl. in [place]? --    No data found.  Updated Vital Signs BP 125/76 (BP Location: Right Arm)   Pulse 67   Temp 98.5 F (36.9 C) (Oral)   Resp 20   SpO2 99%   Visual Acuity Right Eye Distance:   Left Eye Distance:   Bilateral Distance:    Right Eye Near:   Left Eye Near:    Bilateral Near:     Physical Exam Vitals and nursing note reviewed.  Constitutional:      Appearance: Normal appearance.  HENT:     Head: Atraumatic.     Mouth/Throat:     Mouth: Mucous membranes are moist.  Eyes:     Extraocular Movements: Extraocular movements intact.     Conjunctiva/sclera:  Conjunctivae normal.  Cardiovascular:     Rate and Rhythm: Normal rate and regular rhythm.  Pulmonary:     Effort: Pulmonary effort is normal.     Breath sounds: Normal breath sounds.  Musculoskeletal:        General: Swelling and tenderness present. Normal range of motion.     Cervical back: Normal range of motion and neck supple.     Comments: Tenderness to palpation, swelling, warmth to base of right great toe  Skin:    General: Skin is warm and dry.  Neurological:     General: No focal deficit present.     Mental Status: He is oriented to person, place, and time.     Comments: Right foot neurovascularly intact  Psychiatric:        Mood and Affect: Mood normal.        Thought Content: Thought content normal.        Judgment: Judgment normal.    UC Treatments / Results  Labs (all labs ordered are listed, but only abnormal results are displayed) Labs Reviewed - No data to display  EKG  Radiology No results found.  Procedures Procedures (including critical care time)  Medications Ordered in UC Medications - No data to display  Initial Impression / Assessment and Plan / UC Course  I have reviewed the triage vital signs and the nursing notes.  Pertinent labs & imaging results that were available during my care of the patient were reviewed by me and considered in my medical decision making (see chart for details).     Suspect gout, patient declining labs for  further confirmation at this time.  In addition to starting tart cherry supplements, will cautiously treat with Voltaren gel.  He has tried prednisone in the past for this and he states it elevated his blood pressure too high so he cannot take that and given his chronic kidney disease and other medications is not a good candidate for oral NSAIDs or colchicine.  Close PCP follow-up recommended for recheck  Final Clinical Impressions(s) / UC Diagnoses   Final diagnoses:  Toe pain, right     Discharge Instructions       You may take tart cherry supplements (they make capsules or juices, I recommend the capsules so your blood sugars won't run up) to naturally reduce uric acid levels    ED Prescriptions     Medication Sig Dispense Auth. Provider   diclofenac Sodium (VOLTAREN) 1 % GEL Apply 4 g topically 4 (four) times daily. 150 g Volney American, [place]      PDMP not reviewed this encounter.   Volney American, [place] 11/13/21 1616

## 2021-11-13 NOTE — Telephone Encounter (Signed)
Medication sent to pharmacy  

## 2021-11-13 NOTE — Discharge Instructions (Signed)
You may take tart cherry supplements (they make capsules or juices, I recommend the capsules so your blood sugars won't run up) to naturally reduce uric acid levels

## 2021-11-29 ENCOUNTER — Other Ambulatory Visit: Payer: Self-pay | Admitting: *Deleted

## 2021-11-29 ENCOUNTER — Telehealth: Payer: Self-pay | Admitting: Internal Medicine

## 2021-11-29 DIAGNOSIS — I10 Essential (primary) hypertension: Secondary | ICD-10-CM

## 2021-11-29 MED ORDER — CARVEDILOL 3.125 MG PO TABS: 3.1250 mg | ORAL_TABLET | Freq: Two times a day (BID) | ORAL | 3 refills | Status: DC

## 2021-11-29 NOTE — Telephone Encounter (Signed)
Patient needs refills on    carvedilol (COREG) 3.125 MG tablet

## 2021-11-29 NOTE — Telephone Encounter (Signed)
Medication sent to pharmacy  

## 2022-01-06 ENCOUNTER — Other Ambulatory Visit: Payer: Self-pay

## 2022-01-06 ENCOUNTER — Telehealth: Payer: Self-pay | Admitting: Internal Medicine

## 2022-01-06 DIAGNOSIS — N1831 Chronic kidney disease, stage 3a: Secondary | ICD-10-CM | POA: Diagnosis not present

## 2022-01-06 DIAGNOSIS — E1159 Type 2 diabetes mellitus with other circulatory complications: Secondary | ICD-10-CM | POA: Diagnosis not present

## 2022-01-06 MED ORDER — CLOPIDOGREL BISULFATE 75 MG PO TABS: ORAL_TABLET | ORAL | 1 refills | Status: DC

## 2022-01-06 NOTE — Telephone Encounter (Signed)
Pt came into office for med refill. Will not have enough medication to last till Dr. Posey Pronto returns. Can you please refill?       Prescription Request  01/06/2022  Is this a "Controlled Substance" medicine? No  LOV: 11/29/2021   What is the name of the medication or equipment? clopidogrel (PLAVIX) 75 MG tablet   Have you contacted your pharmacy to request a refill? No   Which pharmacy would you like this sent to?  [facility] [place], [place] [facility] [place] [address] [place] [address] Phone: [phone] Fax: [place], [place]. [address] [place] [address] Phone: [phone] Fax: [phone]   Patient notified that their request is being sent to the clinical staff for review and that they should receive a response within 2 business days.   Please advise at [phone]

## 2022-01-06 NOTE — Telephone Encounter (Signed)
Refills sent

## 2022-01-07 LAB — BASIC METABOLIC PANEL
BUN/Creatinine Ratio: 18 (ref 10–24)
BUN: 23 mg/dL (ref 8–27)
CO2: 24 mmol/L (ref 20–29)
Calcium: 9.6 mg/dL (ref 8.6–10.2)
Chloride: 96 mmol/L (ref 96–106)
Creatinine, Ser: 1.31 mg/dL — ABNORMAL HIGH (ref 0.76–1.27)
Glucose: 105 mg/dL — ABNORMAL HIGH (ref 70–99)
Potassium: 4.6 mmol/L (ref 3.5–5.2)
Sodium: 133 mmol/L — ABNORMAL LOW (ref 134–144)
eGFR: 58 mL/min/{1.73_m2} — ABNORMAL LOW (ref >59)

## 2022-01-07 LAB — HEMOGLOBIN A1C
Est. average glucose Bld gHb Est-mCnc: 137 mg/dL
Hgb A1c MFr Bld: 6.4 % — ABNORMAL HIGH (ref 4.8–5.6)

## 2022-01-13 ENCOUNTER — Encounter: Payer: Self-pay | Admitting: Internal Medicine

## 2022-01-13 ENCOUNTER — Ambulatory Visit (INDEPENDENT_AMBULATORY_CARE_PROVIDER_SITE_OTHER): Payer: Medicare HMO | Admitting: Internal Medicine

## 2022-01-13 VITALS — BP 125/69 | HR 78 | Ht 75.0 in | Wt 197.4 lb

## 2022-01-13 DIAGNOSIS — I6523 Occlusion and stenosis of bilateral carotid arteries: Secondary | ICD-10-CM

## 2022-01-13 DIAGNOSIS — E1159 Type 2 diabetes mellitus with other circulatory complications: Secondary | ICD-10-CM

## 2022-01-13 DIAGNOSIS — I10 Essential (primary) hypertension: Secondary | ICD-10-CM

## 2022-01-13 DIAGNOSIS — Z2821 Immunization not carried out because of patient refusal: Secondary | ICD-10-CM

## 2022-01-13 DIAGNOSIS — E782 Mixed hyperlipidemia: Secondary | ICD-10-CM

## 2022-01-13 DIAGNOSIS — I5032 Chronic diastolic (congestive) heart failure: Secondary | ICD-10-CM

## 2022-01-13 DIAGNOSIS — N1831 Chronic kidney disease, stage 3a: Secondary | ICD-10-CM | POA: Diagnosis not present

## 2022-01-13 MED ORDER — DAPAGLIFLOZIN PROPANEDIOL 5 MG PO TABS: 5.0000 mg | ORAL_TABLET | Freq: Every day | ORAL | 3 refills | Status: DC

## 2022-01-13 NOTE — Patient Instructions (Addendum)
Please continue taking medications as prescribed.  Please start taking Farxiga as prescribed.  Please continue to follow low carb diet and ambulate as tolerated.  Please consider getting Shingrix vaccine at local pharmacy.  Please get fasting blood tests done before the next visit.

## 2022-01-13 NOTE — Progress Notes (Unsigned)
Established Patient Office Visit  Subjective:  Patient ID: Albert Morris, male    DOB: 31-Aug-1950  Age: 71 y.o. MRN: [id number]  CC:  Chief Complaint  Patient presents with   Follow-up    Follow up    HPI Albert Morris is a 71 y.o. male with past medical history of HTN, CAD, type II DM with HLD, CVA and tobacco abuse who presents for f/u of his chronic medical conditions.  HTN: BP is well-controlled. Takes medications regularly. Patient denies headache, dizziness, chest pain, dyspnea or palpitations.   HLD: He has been taking Lipitor and Zetia now, but not taking Lipitor regularly.   Type 2 DM: His HbA1C is stable at 6.4. Diet controlled. Does not prefer to take medicine for it. Denies any polyuria or polyphagia.  He was given [place] for his history of HFpEF and CKD, but he did not start taking it as he states that he was told by his pharmacist not to take it considering its side effects (?).  I had extensive discussion with him about its potential benefits in his case and he agrees to take it now.     Past Medical History:  Diagnosis Date   Cerebellar stroke ([place]) 06/24/2016   Admitted with dizziness and "falling to the left" 06/24/16. Work up revealed multifocal acute ischemia within the right cerebellar hemisphere with occlusion versus severe stenosis of the right PICA and extensive, multifocal intracranial atherosclerosis with moderate stenoses of multiple distal MCA branches.   Colon polyps    CVA (cerebral vascular accident) ([place]) 06/21/2016   Diabetes mellitus without complication ([place]) 17/51/0258   Hyperlipidemia    Hypertension    Myocardial infarction ([place])    Pneumonia    Substance abuse ([place])    hx of EtOH and MJ    Past Surgical History:  Procedure Laterality Date   CAROTID ENDARTERECTOMY Left    COLON SURGERY  [phone]   growth removed, Laparoscopic Vs. open Left hemicolectomy Dr. Lovena Neighbours 09-25-17   COLONOSCOPY N/A 06/26/2017   Procedure: COLONOSCOPY;   Surgeon: [name], MD;  [facility];  Service: Endoscopy;  Laterality: N/A;  730   COLONOSCOPY N/A 12/01/2018   Procedure: COLONOSCOPY;  Surgeon: [name], MD;  [facility];  Service: Endoscopy;  Laterality: N/A;  930   COLONOSCOPY WITH PROPOFOL N/A 10/07/2017   Procedure: COLONOSCOPY WITH PROPOFOL WITH TATTOO;  Surgeon: [name], MD;  Location: [facility];  Service: General;  Laterality: N/A;   ENDARTERECTOMY Left 08/01/2016   Procedure: ENDARTERECTOMY LEFT CAROTID;  Surgeon: [name], MD;  Location: [facility];  Service: Vascular;  Laterality: Left;   FLEXIBLE SIGMOIDOSCOPY N/A 10/08/2017   Procedure: FLEXIBLE SIGMOIDOSCOPY;  Surgeon: [name], MD;  Location: [facility];  Service: General;  Laterality: N/A;   LAPAROSCOPIC RIGHT HEMI COLECTOMY Left 10/08/2017   Procedure: LAPAROSCOPIC VERSES OPEN LEFT HEMI COLECTOMY ERAS PATHWAY;  Surgeon: [name], MD;  Location: [facility];  Service: General;  Laterality: Left;   PATCH ANGIOPLASTY Left 08/01/2016   Procedure: PATCH ANGIOPLASTY LEFT CAROTID ARTERY USING ZENOSURE BIOLOGIC PATCH;  Surgeon: [name], MD;  Location: [facility];  Service: Vascular;  Laterality: Left;   POLYPECTOMY  12/01/2018   Procedure: POLYPECTOMY;  Surgeon: [name], MD;  [facility];  Service: Endoscopy;;   SUBMUCOSAL INJECTION  10/07/2017   Procedure: SUBMUCOSAL INJECTION;  Surgeon: [name], MD;  [facility];  Service:  General;;   TONSILLECTOMY      Family History  Problem Relation Age of Onset   Cancer Mother    Breast cancer Mother        lived to be 19   CVA Mother    CAD Mother    Arthritis Mother    Hypertension Mother    Other Father        gunshot wound   Early death Father        GSW   Cancer Brother        Stomach Cancer   Cancer Brother        Lung Cancer   Stroke Maternal Aunt    Alcohol abuse Maternal Uncle    Stroke  Maternal Grandfather    Colon cancer Neg Hx     Social History   Socioeconomic History   Marital status: Divorced    Spouse name: Not on file   Number of children: 2   Years of education: 12   Highest education level: Not on file  Occupational History   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; 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[place], [place], [place], [place], [place], [place], [place], [place]    Comment: alcoholic  Tobacco Use   Smoking status: Former    Packs/day: 0.50    Years: 29.00    Total pack years: 14.50    Types: Cigarettes    Quit date: 2006    Years since quitting: 17.9   Smokeless tobacco: Never  Vaping Use   Vaping Use: Never used  Substance and Sexual Activity   Alcohol use: Not Currently    Comment: quit 2006   Drug use: Not Currently    Comment: remote h/o heavy marijuana use, also used cocaine and others   Sexual activity: Not Currently  Other Topics Concern   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, EMT certified      Lives alone   Likes to walk      2 sons -in [place] and [place]   Social Determinants of Health   Financial Resource Strain: Low Risk  (02/25/2021)   Overall Financial Resource Strain (CARDIA)    Difficulty of Paying Living Expenses: Not hard at all  Food Insecurity: No Food Insecurity (02/25/2021)   Hunger Vital Sign    Worried About Running Out of Food in the Last Year: Never true    [place] in the Last Year: Never true  Transportation Needs: No Transportation Needs (02/25/2021)   PRAPARE - Hydrologist (Medical): No    Lack of Transportation (Non-Medical): No  Physical Activity: Sufficiently Active (02/25/2021)   Exercise Vital Sign    Days of Exercise per Week: 7 days    Minutes of Exercise per Session: 30 min  Stress: No Stress Concern Present (02/25/2021)   [place]    Feeling of Stress : Not at all  Social  Connections: Moderately Isolated (02/25/2021)   Social Connection and Isolation Panel [NHANES]    Frequency of Communication with Friends and Family: More than three times a week    Frequency of Social Gatherings with Friends and Family: Twice a week    Attends Religious Services: More than 4 times per year    Active Member of [employer] or Organizations: No    Attends Archivist Meetings: Never    Marital Status: Divorced  Human resources officer  Violence: Not At Risk (02/25/2021)   Humiliation, Afraid, Rape, and Kick questionnaire    Fear of Current or Ex-Partner: No    Emotionally Abused: No    Physically Abused: No    Sexually Abused: No    Outpatient Medications Prior to Visit  Medication Sig Dispense Refill   aspirin EC 81 MG tablet Take 1 tablet (81 mg total) by mouth daily. 90 tablet 3   atorvastatin (LIPITOR) 80 MG tablet Take 1 tablet (80 mg total) by mouth daily. 90 tablet 3   carvedilol (COREG) 3.125 MG tablet Take 1 tablet (3.125 mg total) by mouth 2 (two) times daily with a meal. 180 tablet 3   Cholecalciferol (VITAMIN D) 50 MCG (2000 UT) CAPS Take 1 capsule by mouth daily.     clopidogrel (PLAVIX) 75 MG tablet TAKE 1 TABLET(75 MG) BY MOUTH DAILY 90 tablet 1   diclofenac Sodium (VOLTAREN) 1 % GEL Apply 4 g topically 4 (four) times daily. 150 g 0   ezetimibe (ZETIA) 10 MG tablet Take 1 tablet (10 mg total) by mouth daily. 90 tablet 3   hydrochlorothiazide (HYDRODIURIL) 25 MG tablet Take 1 tablet (25 mg total) by mouth daily. 90 tablet 3   lisinopril (ZESTRIL) 20 MG tablet Take 1 tablet (20 mg total) by mouth daily. 90 tablet 3   tadalafil (CIALIS) 5 MG tablet Take 1 tablet (5 mg total) by mouth daily as needed for erectile dysfunction. 30 tablet 2   TART CHERRY PO Take by mouth.     dapagliflozin propanediol (FARXIGA) 5 MG TABS tablet Take 1 tablet (5 mg total) by mouth daily before breakfast. 30 tablet 0   No facility-administered medications prior to visit.    Allergies   Allergen Reactions   Blood-Group Specific Substance     No whole blood products    ROS Review of Systems  Constitutional:  Negative for chills and fever.  HENT:  Negative for congestion and sore throat.   Eyes:  Negative for pain and discharge.  Respiratory:  Negative for cough and shortness of breath.   Cardiovascular:  Negative for chest pain and palpitations.  Gastrointestinal:  Negative for constipation, diarrhea, nausea and vomiting.  Endocrine: Negative for polydipsia and polyuria.  Genitourinary:  Negative for dysuria and hematuria.  Musculoskeletal:  Negative for neck pain and neck stiffness.  Skin:  Negative for rash.  Neurological:  Negative for dizziness, weakness, numbness and headaches.  Psychiatric/Behavioral:  Negative for agitation and behavioral problems.       Objective:    Physical Exam Vitals reviewed.  Constitutional:      General: He is not in acute distress.    Appearance: He is not diaphoretic.  HENT:     Head: Normocephalic and atraumatic.     Nose: Nose normal.     Mouth/Throat:     Mouth: Mucous membranes are moist.  Eyes:     General: No scleral icterus.    Extraocular Movements: Extraocular movements intact.  Cardiovascular:     Rate and Rhythm: Normal rate and regular rhythm.     Pulses: Normal pulses.     Heart sounds: Normal heart sounds. No murmur heard. Pulmonary:     Breath sounds: Normal breath sounds. No wheezing or rales.  Musculoskeletal:     Cervical back: Neck supple. No tenderness.     Right lower leg: No edema.     Left lower leg: No edema.  Skin:    General: Skin is warm.  Findings: No rash.  Neurological:     General: No focal deficit present.     Mental Status: He is alert and oriented to person, place, and time.     Sensory: No sensory deficit.     Motor: No weakness.  Psychiatric:        Mood and Affect: Mood normal.        Behavior: Behavior normal.     BP 125/69 (BP Location: Left Arm, Patient  Position: Sitting, Cuff Size: Large)   Pulse 78   Ht _0  (1.905 m)   Wt 197 lb 6.4 oz (89.5 kg)   SpO2 95%   BMI 24.67 kg/m  Wt Readings from Last 3 Encounters:  01/13/22 197 lb 6.4 oz (89.5 kg)  09/11/21 196 lb (88.9 kg)  07/22/21 199 lb 3.2 oz (90.4 kg)    Lab Results  Component Value Date   TSH 1.490 09/04/2021   Lab Results  Component Value Date   WBC 5.6 09/04/2021   HGB 12.7 (L) 09/04/2021   HCT 37.5 09/04/2021   MCV 86 09/04/2021   PLT 317 09/04/2021   Lab Results  Component Value Date   NA 133 (L) 01/06/2022   K 4.6 01/06/2022   CO2 24 01/06/2022   GLUCOSE 105 (H) 01/06/2022   BUN 23 01/06/2022   CREATININE 1.31 (H) 01/06/2022   BILITOT 0.4 09/04/2021   ALKPHOS 86 09/04/2021   AST 19 09/04/2021   ALT 20 09/04/2021   PROT 7.3 09/04/2021   ALBUMIN 4.6 09/04/2021   CALCIUM 9.6 01/06/2022   ANIONGAP 11 10/12/2017   EGFR 58 (L) 01/06/2022   Lab Results  Component Value Date   CHOL 159 09/04/2021   Lab Results  Component Value Date   HDL 48 09/04/2021   Lab Results  Component Value Date   LDLCALC 98 09/04/2021   Lab Results  Component Value Date   TRIG 66 09/04/2021   Lab Results  Component Value Date   CHOLHDL 3.3 09/04/2021   Lab Results  Component Value Date   HGBA1C 6.4 (H) 01/06/2022      Assessment & Plan:   Problem List Items Addressed This Visit       Cardiovascular and Mediastinum   Stenosis of carotid artery    Followed by Vascular surgery, has had left carotid endarterectomy On DAPT and statin      Relevant Orders   Lipid Profile   Essential hypertension    BP Readings from Last 1 Encounters:  01/13/22 125/69  Well-controlled now On lisinopril, HCTZ and Coreg Counseled for compliance with the medications Advised DASH diet and moderate exercise/walking as tolerated      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[place]) - Primary    Lab Results  Component Value Date   HGBA1C 6.4 (H) 01/06/2022   Diet controlled Had added Farxiga for HFpEF and CKD - agrees to take it now Advised to follow diabetic diet On statin and ACEi F/u CMP and lipid panel Diabetic eye exam: Advised to follow up with Ophthalmology for diabetic eye exam      Relevant Medications   dapagliflozin propanediol (FARXIGA) 5 MG TABS tablet   Other Relevant Orders   CMP14+EGFR   Hemoglobin A1c   Urine Microalbumin w/creat. ratio (Completed)   Chronic heart failure with preserved ejection fraction ([place])    Last Echo reviewed On ACEi and B-blocker Added Farxiga, he did not take it as he was concerned about its side effects, after  counseling he agrees to start taking it      Relevant Medications   dapagliflozin propanediol (FARXIGA) 5 MG TABS tablet     Genitourinary   Stage 3a chronic kidney disease (HCC)    Likely from HTN, DM and age related decline On lisinopril and HCTZ for HTN Added Farxiga for CKD and HFpEF Avoid nephrotoxic agents Advised to improve fluid intake to at least 64 ounces of fluid in a day      Relevant Medications   dapagliflozin propanediol (FARXIGA) 5 MG TABS tablet   Other Relevant Orders   CBC with Differential/Platelet   CMP14+EGFR   Urine Microalbumin w/creat. ratio (Completed)     Other   Mixed hyperlipidemia    Lipid profile reviewed On Zetia Continue Lipitor 80 mg every other day (has leg cramps with daily dosing) - needs to take it regularly      Relevant Orders   Lipid Profile   Other Visit Diagnoses     Influenza vaccination declined           Meds ordered this encounter  Medications   dapagliflozin propanediol (FARXIGA) 5 MG TABS tablet    Sig: Take 1 tablet (5 mg total) by mouth daily before breakfast.    Dispense:  30 tablet    Refill:  3    Follow-up: Return in about 4 months (around 05/14/2022) for CKD and DM.    Lindell Spar, MD

## 2022-01-14 DIAGNOSIS — N1831 Chronic kidney disease, stage 3a: Secondary | ICD-10-CM | POA: Diagnosis not present

## 2022-01-14 DIAGNOSIS — E1159 Type 2 diabetes mellitus with other circulatory complications: Secondary | ICD-10-CM | POA: Diagnosis not present

## 2022-01-16 LAB — MICROALBUMIN / CREATININE URINE RATIO
Creatinine, Urine: 46.2 mg/dL
Microalb/Creat Ratio: <6 mg/g creat (ref 0–29)
Microalbumin, Urine: <3 ug/mL

## 2022-01-16 NOTE — Assessment & Plan Note (Signed)
Likely from HTN, DM and age related decline On lisinopril and HCTZ for HTN Added Farxiga for CKD and HFpEF Avoid nephrotoxic agents Advised to improve fluid intake to at least 64 ounces of fluid in a day

## 2022-01-16 NOTE — Assessment & Plan Note (Signed)
Followed by Vascular surgery, has had left carotid endarterectomy On DAPT and statin

## 2022-01-16 NOTE — Assessment & Plan Note (Signed)
BP Readings from Last 1 Encounters:  01/13/22 125/69   Well-controlled now On lisinopril, HCTZ and Coreg Counseled for compliance with the medications Advised DASH diet and moderate exercise/walking as tolerated

## 2022-01-16 NOTE — Assessment & Plan Note (Signed)
Lipid profile reviewed On Zetia Continue Lipitor 80 mg every other day (has leg cramps with daily dosing) - needs to take it regularly

## 2022-01-16 NOTE — Assessment & Plan Note (Addendum)
Lab Results  Component Value Date   HGBA1C 6.4 (H) 01/06/2022   Diet controlled Had added Farxiga for HFpEF and CKD - agrees to take it now Advised to follow diabetic diet On statin and ACEi F/u CMP and lipid panel Diabetic eye exam: Advised to follow up with Ophthalmology for diabetic eye exam

## 2022-01-16 NOTE — Assessment & Plan Note (Addendum)
Last Echo reviewed On ACEi and B-blocker Added Farxiga, he did not take it as he was concerned about its side effects, after counseling he agrees to start taking it

## 2022-01-17 ENCOUNTER — Encounter: Payer: Self-pay | Admitting: Podiatry

## 2022-01-17 ENCOUNTER — Ambulatory Visit: Payer: Medicare HMO | Admitting: Podiatry

## 2022-01-17 VITALS — BP 124/58

## 2022-01-17 DIAGNOSIS — N1831 Chronic kidney disease, stage 3a: Secondary | ICD-10-CM

## 2022-01-17 DIAGNOSIS — E119 Type 2 diabetes mellitus without complications: Secondary | ICD-10-CM

## 2022-01-17 DIAGNOSIS — B351 Tinea unguium: Secondary | ICD-10-CM | POA: Diagnosis not present

## 2022-01-17 DIAGNOSIS — M79674 Pain in right toe(s): Secondary | ICD-10-CM

## 2022-01-17 DIAGNOSIS — M79675 Pain in left toe(s): Secondary | ICD-10-CM

## 2022-01-17 NOTE — Progress Notes (Signed)
This patient returns to my office for at risk foot care.  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  Patient is taking plavix.  This patient is unable to cut nails himself since the patient cannot reach his nails.These nails are painful walking and wearing shoes.  This patient presents for at risk foot care today.  General Appearance  Alert, conversant and in no acute stress.  Vascular  Dorsalis pedis and posterior tibial  pulses are weakly  palpable  bilaterally.  Capillary return is within normal limits  bilaterally. Temperature is within normal limits  bilaterally.  Neurologic  Senn-Weinstein monofilament wire test within normal limits/diminished   bilaterally. Muscle power within normal limits bilaterally.  Nails Thick disfigured discolored nails with subungual debris  from hallux to fifth toes bilaterally. No evidence of bacterial infection or drainage bilaterally.  Orthopedic  No limitations of motion  feet .  No crepitus or effusions noted.  No bony pathology or digital deformities noted. HAV  B/L.  Skin  normotropic skin with no porokeratosis noted bilaterally.  No signs of infections or ulcers noted.     Onychomycosis  Pain in right toes  Pain in left toes  Consent was obtained for treatment procedures.   Mechanical debridement of nails 1-5  bilaterally performed with a nail nipper.  Filed with dremel without incident.    Return office visit   3 months                  Told patient to return for periodic foot care and evaluation due to potential at risk complications.   [place] Giammona DPM  

## 2022-02-20 ENCOUNTER — Other Ambulatory Visit: Payer: Self-pay | Admitting: Internal Medicine

## 2022-02-20 DIAGNOSIS — I10 Essential (primary) hypertension: Secondary | ICD-10-CM

## 2022-02-21 ENCOUNTER — Ambulatory Visit
Admission: EM | Admit: 2022-02-21 | Discharge: 2022-02-21 | Disposition: A | Discharge status: Home / Self Care | Payer: Medicare HMO

## 2022-02-21 ENCOUNTER — Other Ambulatory Visit: Payer: Self-pay

## 2022-02-21 DIAGNOSIS — M109 Gout, unspecified: Secondary | ICD-10-CM

## 2022-02-21 NOTE — Discharge Instructions (Addendum)
Based on your current medications, there is a contraindication for you to begin colchicine. Recommend that you continue ibuprofen.  Recommend taking 2 tablets or 400 mg twice daily while symptoms persist. Continue to monitor your dietary intake to eat foods that worsen your gout symptoms. As discussed, please follow-up with your primary care physician next week for further evaluation.  Recommend that you have your uric acid level checked.  Follow-up as needed.

## 2022-02-21 NOTE — ED Triage Notes (Signed)
Pt reports right great toe "gout flare-up" x4 days.

## 2022-02-21 NOTE — ED Provider Notes (Signed)
[facility]    CSN: [id number] Arrival date & time: 02/21/22  2353      History   Chief Complaint Chief Complaint  Patient presents with   Toe Pain    HPI Albert Morris is a 72 y.o. male.   The history is provided by the patient.   Patient presents with a 3-day history of pain, swelling, redness, and warmth to the right great toe.  Patient reports a previous history of gout and states that he has been saying for the seen for the same or similar symptoms.  Previous notes state patient has had x-rays that were negative at the time he had the same or similar symptoms.  He reports that he was prescribed diclofenac at his last appointment, but had as a adverse reactions to the medication.  Also reports that he cannot take prednisone as this elevates his heart rate and blood glucose levels.  Patient states that in the past he has not had blood work done for gout as he was scheduled to see his PCP.  There is no recent lab work showing uric acid levels for this patient.  Patient reports he has been taking Advil for his symptoms.  He does report that he does have a history of chronic kidney disease and diabetes.  He currently is not on any medication for his diabetes as he reports that the PCP recommended Farxiga, but he was concerned because of the effects it may have on his kidneys.  Past Medical History:  Diagnosis Date   Cerebellar stroke ([place]) 06/24/2016   Admitted with dizziness and "falling to the left" 06/24/16. Work up revealed multifocal acute ischemia within the right cerebellar hemisphere with occlusion versus severe stenosis of the right PICA and extensive, multifocal intracranial atherosclerosis with moderate stenoses of multiple distal MCA branches.   Colon polyps    CVA (cerebral vascular accident) ([place]) 06/21/2016   Diabetes mellitus without complication ([place]) 61/44/3154   Hyperlipidemia    Hypertension    Myocardial infarction ([place])    Pneumonia    Substance  abuse ([place])    hx of EtOH and MJ    Patient Active Problem List   Diagnosis Date Noted   Chronic heart failure with preserved ejection fraction ([place]) 09/11/2021   Encounter for general adult medical examination with abnormal findings 09/11/2021   Prostate cancer screening 05/08/2021   Stage 3a chronic kidney disease ([place]) 05/08/2021   Atherosclerosis of aorta ([place]) 01/07/2021   History of CVA (cerebrovascular accident) 01/07/2021   Mixed hyperlipidemia 01/07/2021   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   Hypogonadism in male 11/22/2020   Anemia 11/22/2020   Pain due to onychomycosis of toenails of both feet 12/14/2019   History of tobacco use 01/26/2019   Hx of colonic polyps 11/12/2018   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[place]) 11/11/2018   Colon polyp 10/08/2017   Positive colorectal cancer screening using Cologuard test 05/28/2017   Vitamin D deficiency 10/14/2016   History of carotid endarterectomy 10/14/2016   History of MI (myocardial infarction) 07/08/2016   History of substance abuse ([place]) 07/08/2016   Coronary artery disease of native artery of native heart with stable angina pectoris ([place])    Essential hypertension 07/07/2016   Stenosis of carotid artery    Cardiomyopathy [facility] ([place]     Past Surgical History:  Procedure Laterality Date   CAROTID ENDARTERECTOMY Left    COLON SURGERY  [phone]   growth removed, Laparoscopic Vs. open Left hemicolectomy  Dr. Lovena Neighbours 09-25-17   COLONOSCOPY N/A 06/26/2017   Procedure: COLONOSCOPY;  Surgeon: [name], MD;  [facility];  Service: Endoscopy;  Laterality: N/A;  730   COLONOSCOPY N/A 12/01/2018   Procedure: COLONOSCOPY;  Surgeon: [name], MD;  [facility];  Service: Endoscopy;  Laterality: N/A;  930   COLONOSCOPY WITH PROPOFOL N/A 10/07/2017   Procedure: COLONOSCOPY WITH PROPOFOL WITH TATTOO;  Surgeon: [name], MD;  Location[facility];  Service:  General;  Laterality: N/A;   ENDARTERECTOMY Left 08/01/2016   Procedure: ENDARTERECTOMY LEFT CAROTID;  Surgeon: [name], MD;  Location: [place];  Service: Vascular;  Laterality: Left;   FLEXIBLE SIGMOIDOSCOPY N/A 10/08/2017   Procedure: FLEXIBLE SIGMOIDOSCOPY;  Surgeon: [name], MD;  Location: [facility];  Service: General;  Laterality: N/A;   LAPAROSCOPIC RIGHT HEMI COLECTOMY Left 10/08/2017   Procedure: LAPAROSCOPIC VERSES OPEN LEFT HEMI COLECTOMY ERAS PATHWAY;  Surgeon: [name], MD;  Location: [facility];  Service: General;  Laterality: Left;   PATCH ANGIOPLASTY Left 08/01/2016   Procedure: PATCH ANGIOPLASTY LEFT CAROTID ARTERY USING ZENOSURE BIOLOGIC PATCH;  Surgeon: [name], MD;  Location: [place];  Service: Vascular;  Laterality: Left;   POLYPECTOMY  12/01/2018   Procedure: POLYPECTOMY;  Surgeon: [name], MD;  [facility];  Service: Endoscopy;;   SUBMUCOSAL INJECTION  10/07/2017   Procedure: SUBMUCOSAL INJECTION;  Surgeon: [name], MD;  Location: [facility];  Service: General;;   TONSILLECTOMY         Home Medications    Prior to Admission medications   Medication Sig Start Date End Date Taking? Authorizing Provider  aspirin EC 81 MG tablet Take 1 tablet (81 mg total) by mouth daily. 04/17/17   Herminio Commons, MD  atorvastatin (LIPITOR) 80 MG tablet Take 1 tablet (80 mg total) by mouth daily. 09/11/21   Lindell Spar, MD  carvedilol (COREG) 3.125 MG tablet Take 1 tablet (3.125 mg total) by mouth 2 (two) times daily with a meal. 11/29/21   Lindell Spar, MD  Cholecalciferol (VITAMIN D) 50 MCG (2000 UT) CAPS Take 1 capsule by mouth daily.    [provider]  clopidogrel (PLAVIX) 75 MG tablet TAKE 1 TABLET(75 MG) BY MOUTH DAILY 01/06/22   Lindell Spar, MD  dapagliflozin propanediol (FARXIGA) 5 MG TABS tablet Take 1 tablet (5 mg total) by mouth daily before breakfast. 01/13/22   Lindell Spar, MD  diclofenac Sodium (VOLTAREN) 1 % GEL Apply 4 g topically 4 (four) times daily. 11/13/21   Volney American, PA-C  ezetimibe (ZETIA) 10 MG tablet Take 1 tablet (10 mg total) by mouth daily. 04/17/21   Lindell Spar, MD  hydrochlorothiazide (HYDRODIURIL) 25 MG tablet Take 1 tablet (25 mg total) by mouth daily. 11/13/21   Lindell Spar, MD  lisinopril (ZESTRIL) 20 MG tablet TAKE 1 TABLET(20 MG) BY MOUTH DAILY 02/20/22   Lindell Spar, MD  tadalafil (CIALIS) 5 MG tablet Take 1 tablet (5 mg total) by mouth daily as needed for erectile dysfunction. 01/07/21   Lindell Spar, MD  TART CHERRY PO Take by mouth.    [provider]    Family History Family History  Problem Relation Age of Onset   Cancer Mother    Breast cancer Mother        lived to be 23   CVA Mother    CAD  Mother    Arthritis Mother    Hypertension Mother    Other Father        gunshot wound   Early death Father        GSW   Cancer Brother        Stomach Cancer   Cancer Brother        Lung Cancer   Stroke Maternal Aunt    Alcohol abuse Maternal Uncle    Stroke Maternal Grandfather    Colon cancer Neg Hx     Social History Social History   Tobacco Use   Smoking status: Former    Packs/day: 0.50    Years: 29.00    Total pack years: 14.50    Types: Cigarettes    Quit date: 2006    Years since quitting: 18.0   Smokeless tobacco: Never  Vaping Use   Vaping Use: Never used  Substance Use Topics   Alcohol use: Not Currently    Comment: quit 2006   Drug use: Not Currently    Comment: remote h/o heavy marijuana use, also used cocaine and others     Allergies   Blood-group specific substance, Diclofenac sodium-menthol gel [diclofenac sodium-menthol crm], and Prednisone   Review of Systems Review of Systems Per HPI  Physical Exam Triage Vital Signs ED Triage Vitals [02/21/22 0913]  Enc Vitals Group     BP (!) 154/78     Pulse Rate 64     Resp 20     Temp 98.6 F (37 C)      Temp Source Oral     SpO2 95 %     Weight      Height      Head Circumference      Peak Flow      Pain Score 6     Pain Loc      Pain Edu?      Excl. in [place]?    No data found.  Updated Vital Signs BP (!) 154/78 (BP Location: Right Arm)   Pulse 64   Temp 98.6 F (37 C) (Oral)   Resp 20   SpO2 95%   Visual Acuity Right Eye Distance:   Left Eye Distance:   Bilateral Distance:    Right Eye Near:   Left Eye Near:    Bilateral Near:     Physical Exam Vitals and nursing note reviewed.  Constitutional:      General: He is not in acute distress.    Appearance: Normal appearance.  HENT:     Head: Normocephalic.     Right Ear: Tympanic membrane, ear canal and external ear normal.     Left Ear: Tympanic membrane, ear canal and external ear normal.  Eyes:     Extraocular Movements: Extraocular movements intact.     Conjunctiva/sclera: Conjunctivae normal.     Pupils: Pupils are equal, round, and reactive to light.  Cardiovascular:     Rate and Rhythm: Normal rate and regular rhythm.     Pulses: Normal pulses.     Heart sounds: Normal heart sounds.  Pulmonary:     Effort: Pulmonary effort is normal. No respiratory distress.     Breath sounds: Normal breath sounds. No stridor. No wheezing, rhonchi or rales.  Abdominal:     General: Bowel sounds are normal.     Palpations: Abdomen is soft.     Tenderness: There is no abdominal tenderness.  Musculoskeletal:     Cervical back: Normal range of motion.  Right foot: Normal range of motion and normal capillary refill. Swelling and tenderness present. No deformity or crepitus. Normal pulse.     Comments: Tenderness, pain, and swelling noted to the first metatarsal, base of the right great toe.  Neurovascular status is intact.  Lymphadenopathy:     Cervical: No cervical adenopathy.  Skin:    General: Skin is warm and dry.  Neurological:     General: No focal deficit present.     Mental Status: He is alert and oriented to  person, place, and time.  Psychiatric:        Mood and Affect: Mood normal.        Behavior: Behavior normal.      UC Treatments / Results  Labs (all labs ordered are listed, but only abnormal results are displayed) Labs Reviewed - No data to display  EKG   Radiology No results found.  Procedures Procedures (including critical care time)  Medications Ordered in UC Medications - No data to display  Initial Impression / Assessment and Plan / UC Course  I have reviewed the triage vital signs and the nursing notes.  Pertinent labs & imaging results that were available during my care of the patient were reviewed by me and considered in my medical decision making (see chart for details).  The patient is well-appearing, he is in no acute distress, he is mildly hypertensive, but vital signs are otherwise stable.  Suspect that this is a another gout flare for the patient.  Patient declines blood work today as he reports that he is scheduled to see his PCP with over the next week.  Patient has a history of diabetes and chronic kidney disease.  He reports adverse reactions to both prednisone and diclofenac gel that was previously prescribed.  Attempted to prescribe colchicine for the patient today as his GFR is 58, but because patient takes carvedilol, will avoid due to possible drug-drug interaction.  Discussing with patient, and patient is in agreement with beginning ibuprofen 400 mg twice daily.  Patient was also given information regarding a low purine diet.  Patient was advised to follow-up with his primary care physician for further evaluation.  Supportive care recommendations were provided to the patient.  The lyses understanding.  All questions were answered.  Patient is stable for discharge.   Final Clinical Impressions(s) / UC Diagnoses   Final diagnoses:  Acute gout involving toe of right foot, unspecified cause     Discharge Instructions      Based on your current  medications, there is a contraindication for you to begin colchicine. Recommend that you continue ibuprofen.  Recommend taking 2 tablets or 400 mg twice daily while symptoms persist. Continue to monitor your dietary intake to eat foods that worsen your gout symptoms. As discussed, please follow-up with your primary care physician next week for further evaluation.  Recommend that you have your uric acid level checked.  Follow-up as needed.     ED Prescriptions   None    PDMP not reviewed this encounter.   Tish Men, NP 02/21/22 1057

## 2022-03-10 ENCOUNTER — Ambulatory Visit (INDEPENDENT_AMBULATORY_CARE_PROVIDER_SITE_OTHER): Payer: Medicare HMO

## 2022-03-10 VITALS — BP 164/86 | HR 71 | Ht 75.0 in | Wt 198.1 lb

## 2022-03-10 DIAGNOSIS — Z0000 Encounter for general adult medical examination without abnormal findings: Secondary | ICD-10-CM | POA: Diagnosis not present

## 2022-03-10 NOTE — Progress Notes (Signed)
Subjective:   Albert Morris is a 72 y.o. male who presents for Medicare Annual/Subsequent preventive examination.  Review of Systems           Objective:    There were no vitals filed for this visit. There is no height or weight on file to calculate BMI.     02/25/2021    9:25 AM 09/03/2020    9:34 AM 02/07/2020   10:39 AM 12/01/2018    8:34 AM 10/08/2017    9:03 AM 10/07/2017   12:24 PM 09/18/2017   11:37 AM  Advanced Directives  Does Patient Have a Medical Advance Directive? Yes Yes Yes No No  No  Type of Advance Directive Living will  [place]      Does patient want to make changes to medical advance directive? Yes (Inpatient - patient defers changing a medical advance directive at this time - Information given) No - Patient declined No - Patient declined      Copy of [place] in Chart?   No - copy requested      Would patient like information on creating a medical advance directive?  No - Patient declined  No - Patient declined No - Patient declined No - Patient declined Yes (MAU/Ambulatory/Procedural Areas - Information given)    Current Medications (verified) Outpatient Encounter Medications as of 03/10/2022  Medication Sig   aspirin EC 81 MG tablet Take 1 tablet (81 mg total) by mouth daily.   atorvastatin (LIPITOR) 80 MG tablet Take 1 tablet (80 mg total) by mouth daily.   carvedilol (COREG) 3.125 MG tablet Take 1 tablet (3.125 mg total) by mouth 2 (two) times daily with a meal.   Cholecalciferol (VITAMIN D) 50 MCG (2000 UT) CAPS Take 1 capsule by mouth daily.   clopidogrel (PLAVIX) 75 MG tablet TAKE 1 TABLET(75 MG) BY MOUTH DAILY   dapagliflozin propanediol (FARXIGA) 5 MG TABS tablet Take 1 tablet (5 mg total) by mouth daily before breakfast.   diclofenac Sodium (VOLTAREN) 1 % GEL Apply 4 g topically 4 (four) times daily.   ezetimibe (ZETIA) 10 MG tablet Take 1 tablet (10 mg total) by mouth daily.   hydrochlorothiazide  (HYDRODIURIL) 25 MG tablet Take 1 tablet (25 mg total) by mouth daily.   lisinopril (ZESTRIL) 20 MG tablet TAKE 1 TABLET(20 MG) BY MOUTH DAILY   tadalafil (CIALIS) 5 MG tablet Take 1 tablet (5 mg total) by mouth daily as needed for erectile dysfunction.   TART CHERRY PO Take by mouth.   No facility-administered encounter medications on file as of 03/10/2022.    Allergies (verified) Blood-group specific substance, Diclofenac sodium-menthol gel [diclofenac sodium-menthol crm], and Prednisone   History: Past Medical History:  Diagnosis Date   Cerebellar stroke ([place]) 06/24/2016   Admitted with dizziness and "falling to the left" 06/24/16. Work up revealed multifocal acute ischemia within the right cerebellar hemisphere with occlusion versus severe stenosis of the right PICA and extensive, multifocal intracranial atherosclerosis with moderate stenoses of multiple distal MCA branches.   Colon polyps    CVA (cerebral vascular accident) ([place]) 06/21/2016   Diabetes mellitus without complication ([place]) 29/79/8921   Hyperlipidemia    Hypertension    Myocardial infarction ([place])    Pneumonia    Substance abuse ([place])    hx of EtOH and MJ   Past Surgical History:  Procedure Laterality Date   CAROTID ENDARTERECTOMY Left    COLON SURGERY  [phone]   growth removed,  Laparoscopic Vs. open Left hemicolectomy Dr. Lovena Neighbours 09-25-17   COLONOSCOPY N/A 06/26/2017   Procedure: COLONOSCOPY;  Surgeon: [name], MD;  [facility];  Service: Endoscopy;  Laterality: N/A;  730   COLONOSCOPY N/A 12/01/2018   Procedure: COLONOSCOPY;  Surgeon: [name], MD;  [facility];  Service: Endoscopy;  Laterality: N/A;  930   COLONOSCOPY WITH PROPOFOL N/A 10/07/2017   Procedure: COLONOSCOPY WITH PROPOFOL WITH TATTOO;  Surgeon: [name], MD;  Location: [facility];  Service: General;  Laterality: N/A;   ENDARTERECTOMY Left 08/01/2016   Procedure: ENDARTERECTOMY LEFT CAROTID;  Surgeon:  [name], MD;  Location: [place];  Service: Vascular;  Laterality: Left;   FLEXIBLE SIGMOIDOSCOPY N/A 10/08/2017   Procedure: FLEXIBLE SIGMOIDOSCOPY;  Surgeon: [name], MD;  Location: [facility];  Service: General;  Laterality: N/A;   LAPAROSCOPIC RIGHT HEMI COLECTOMY Left 10/08/2017   Procedure: LAPAROSCOPIC VERSES OPEN LEFT HEMI COLECTOMY ERAS PATHWAY;  Surgeon: [name], MD;  Location: [facility];  Service: General;  Laterality: Left;   PATCH ANGIOPLASTY Left 08/01/2016   Procedure: PATCH ANGIOPLASTY LEFT CAROTID ARTERY USING ZENOSURE BIOLOGIC PATCH;  Surgeon: [name], MD;  Location: [facility];  Service: Vascular;  Laterality: Left;   POLYPECTOMY  12/01/2018   Procedure: POLYPECTOMY;  Surgeon: [name], MD;  [facility];  Service: Endoscopy;;   SUBMUCOSAL INJECTION  10/07/2017   Procedure: SUBMUCOSAL INJECTION;  Surgeon: [name], MD;  Location: [facility];  Service: General;;   TONSILLECTOMY     Family History  Problem Relation Age of Onset   Cancer Mother    Breast cancer Mother        lived to be 88   CVA Mother    CAD Mother    Arthritis Mother    Hypertension Mother    Other Father        gunshot wound   Early death Father        GSW   Cancer Brother        Stomach Cancer   Cancer Brother        Lung Cancer   Stroke Maternal Aunt    Alcohol abuse Maternal Uncle    Stroke Maternal Grandfather    Colon cancer Neg Hx    Social History   Socioeconomic History   Marital status: Divorced    Spouse name: Not on file   Number of children: 2   Years of education: 12   Highest education level: Not on file  Occupational History   Occupation: disabled Veteran x8 years; 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[place], [place], [place], [place], [place], [place], [place], [place]: alcoholic  Tobacco Use   Smoking status: Former    Packs/day: 0.50    Years: 29.00    Total pack years: 14.50    Types: Cigarettes    Quit date: 2006     Years since quitting: 18.0   Smokeless tobacco: Never  Vaping Use   Vaping Use: Never used  Substance and Sexual Activity   Alcohol use: Not Currently    Comment: quit 2006   Drug use: Not Currently    Comment: remote h/o heavy marijuana use, also used cocaine and others   Sexual activity: Not Currently  Other Topics Concern   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, EMT certified      Lives  alone   Likes to walk      2 sons -in [place] and [place]   Social Determinants of Health   Financial Resource Strain: Low Risk  (02/25/2021)   Overall Financial Resource Strain (CARDIA)    Difficulty of Paying Living Expenses: Not hard at all  Food Insecurity: No Food Insecurity (02/25/2021)   Hunger Vital Sign    Worried About Running Out of Food in the Last Year: Never true    Ran Out of Food in the Last Year: Never true  Transportation Needs: No Transportation Needs (02/25/2021)   PRAPARE - Hydrologist (Medical): No    Lack of Transportation (Non-Medical): No  Physical Activity: Sufficiently Active (02/25/2021)   Exercise Vital Sign    Days of Exercise per Week: 7 days    Minutes of Exercise per Session: 30 min  Stress: No Stress Concern Present (02/25/2021)   [place]    Feeling of Stress : Not at all  Social Connections: Moderately Isolated (02/25/2021)   Social Connection and Isolation Panel [NHANES]    Frequency of Communication with Friends and Family: More than three times a week    Frequency of Social Gatherings with Friends and Family: Twice a week    Attends Religious Services: More than 4 times per year    Active Member of [employer] or Organizations: No    Attends Archivist Meetings: Never    Marital Status: Divorced    Tobacco Counseling Counseling given: Not Answered   Clinical Intake:  Diabetic?YES Nutrition Risk  Assessment:  Has the patient had any N/V/D within the last 2 months?  No  Does the patient have any non-healing wounds?  No  Has the patient had any unintentional weight loss or weight gain?  No   Diabetes:  Is the patient diabetic?  Yes  If diabetic, was a CBG obtained today?  No  Did the patient bring in their glucometer from home?  No  How often do you monitor your CBG's? never.   Financial Strains and Diabetes Management:  Are you having any financial strains with the device, your supplies or your medication? No .  Does the patient want to be seen by Chronic Care Management for management of their diabetes?  No  Would the patient like to be referred to a Nutritionist or for Diabetic Management?  No   Diabetic Exams:  Diabetic Eye Exam: Completed 06/19/21 Diabetic Foot Exam: Completed 01/13/22    Activities of Daily Living     No data to display           Patient Care Team: Lindell Spar, MD as PCP - General (Internal Medicine) Fay Records, MD as PCP - Cardiology (Cardiology)  Indicate any recent Medical Services you may have received from other than [facility] providers in the past year (date may be approximate).     Assessment:   This is a routine wellness examination for [place].  Hearing/Vision screen No results found.  Dietary issues and exercise activities discussed:     Goals Addressed   None   Depression Screen    01/13/2022    9:20 AM 09/11/2021   10:19 AM 07/22/2021    1:07 PM 05/08/2021   10:10 AM 02/25/2021    9:25 AM 02/25/2021    9:23 AM 01/07/2021    1:23 PM  PHQ 2/9 Scores  PHQ - 2 Score 0 0 0 0 0  0 0    Fall Risk    01/13/2022    9:19 AM 09/11/2021   10:19 AM 07/22/2021    1:07 PM 05/08/2021   10:09 AM 02/25/2021    9:25 AM  Fall Risk   Falls in the past year? 0 0 0 0 0  Number falls in past yr: 0 0 0 0 0  Injury with Fall? 0 0 0 0 0  Risk for fall due to : No Fall Risks No Fall Risks No Fall Risks No Fall Risks No Fall Risks  Follow  up Falls evaluation completed Falls evaluation completed Falls evaluation completed Falls evaluation completed Falls evaluation completed    FALL RISK PREVENTION PERTAINING TO THE HOME:  Any stairs in or around the home? Yes  If so, are there any without handrails? No  Home free of loose throw rugs in walkways, pet beds, electrical cords, etc? Yes  Adequate lighting in your home to reduce risk of falls? Yes   ASSISTIVE DEVICES UTILIZED TO PREVENT FALLS:  Life alert? No  Use of a cane, walker or w/c? No  Grab bars in the bathroom? yes Shower chair or bench in shower? No  Elevated toilet seat or a handicapped toilet? No   TIMED UP AND GO:  Was the test performed? Yes .  Length of time to ambulate 10 feet: 5 sec.   Gait steady and fast without use of assistive device  Cognitive Function:    02/25/2021    9:26 AM  MMSE - Mini Mental State Exam  Not completed: Unable to complete        02/25/2021    9:26 AM 02/07/2020   10:46 AM  6CIT Screen  What Year? 0 points 0 points  What month? 0 points 0 points  What time? 0 points 0 points  Count back from 20 0 points 0 points  Months in reverse 0 points 0 points  Repeat phrase 0 points 0 points  Total Score 0 points 0 points    Immunizations There is no immunization history for the selected administration types on file for this patient.  TDAP status: Due, Education has been provided regarding the importance of this vaccine. Advised may receive this vaccine at local pharmacy or Health Dept. Aware to provide a copy of the vaccination record if obtained from local pharmacy or Health Dept. Verbalized acceptance and understanding.  Flu Vaccine status: Due, Education has been provided regarding the importance of this vaccine. Advised may receive this vaccine at local pharmacy or Health Dept. Aware to provide a copy of the vaccination record if obtained from local pharmacy or Health Dept. Verbalized acceptance and  understanding.  Pneumococcal vaccine status: Due, Education has been provided regarding the importance of this vaccine. Advised may receive this vaccine at local pharmacy or Health Dept. Aware to provide a copy of the vaccination record if obtained from local pharmacy or Health Dept. Verbalized acceptance and understanding.  Covid-19 vaccine status: Information provided on how to obtain vaccines.   Qualifies for Shingles Vaccine? Yes   Zostavax completed No   Shingrix Completed?: No.    Education has been provided regarding the importance of this vaccine. Patient has been advised to call insurance company to determine out of pocket expense if they have not yet received this vaccine. Advised may also receive vaccine at local pharmacy or Health Dept. Verbalized acceptance and understanding.  Screening Tests Health Maintenance  Topic Date Due   COVID-19 Vaccine (1) Never done  DTaP/Tdap/Td (1 - Tdap) Never done   Zoster Vaccines- Shingrix (1 of 2) Never done   Pneumonia Vaccine 74+ Years old (1 - PCV) Never done   INFLUENZA VACCINE  05/18/2022 (Originally 09/17/2021)   OPHTHALMOLOGY EXAM  06/20/2022   HEMOGLOBIN A1C  07/07/2022   Diabetic kidney evaluation - eGFR measurement  01/07/2023   FOOT EXAM  01/14/2023   Diabetic kidney evaluation - Urine ACR  01/15/2023   Medicare Annual Wellness (AWV)  03/11/2023   COLONOSCOPY (Pts 45-64yr Insurance coverage will need to be confirmed)  12/01/2023   Hepatitis C Screening  Completed   HPV VACCINES  Aged Out    Health Maintenance  Health Maintenance Due  Topic Date Due   COVID-19 Vaccine (1) Never done   DTaP/Tdap/Td (1 - Tdap) Never done   Zoster Vaccines- Shingrix (1 of 2) Never done   Pneumonia Vaccine 73 Years old (1 - PCV) Never done    Colorectal cancer screening: Type of screening: Colonoscopy. Completed 12/01/2018. Repeat every 5 years  Lung Cancer Screening: (Low Dose CT Chest recommended if Age 579-80years, 30 pack-year  currently smoking OR have quit w/in 15years.) does not qualify.   Lung Cancer Screening Referral: NO  Additional Screening:  Hepatitis C Screening: does qualify; Completed 09/17/2016  Vision Screening: Recommended annual ophthalmology exams for early detection of glaucoma and other disorders of the eye. Is the patient up to date with their annual eye exam?  No  Who is the provider or what is the name of the office in which the patient attends annual eye exams? My eye doctor If pt is not established with a provider, would they like to be referred to a provider to establish care? No .   Dental Screening: Recommended annual dental exams for proper oral hygiene  Community Resource Referral / Chronic Care Management: CRR required this visit?  No   CCM required this visit?  No      Plan:     I have personally reviewed and noted the following in the patient's chart:   Medical and social history Use of alcohol, tobacco or illicit drugs  Current medications and supplements including opioid prescriptions. Patient is not currently taking opioid prescriptions. Functional ability and status Nutritional status Physical activity Advanced directives List of other physicians Hospitalizations, surgeries, and ER visits in previous 12 months Vitals Screenings to include cognitive, depression, and falls Referrals and appointments  In addition, I have reviewed and discussed with patient certain preventive protocols, quality metrics, and best practice recommendations. A written personalized care plan for preventive services as well as general preventive health recommendations were provided to patient.     KQuentin Angst COregon  03/10/2022

## 2022-03-10 NOTE — Patient Instructions (Signed)
  [name] , Thank you for taking time to come for your Medicare Wellness Visit. I appreciate your ongoing commitment to your health goals. Please review the following plan we discussed and let me know if I can assist you in the future.   These are the goals we discussed:  Goals      Blood Pressure < 140/90     Exercise 150 minutes per week (walking)     Patient Stated     Exercise,walk more,read [employer]     Patient Stated     Start exercising.        This is a list of the screening recommended for you and due dates:  Health Maintenance  Topic Date Due   COVID-19 Vaccine (1) Never done   DTaP/Tdap/Td vaccine (1 - Tdap) Never done   Zoster (Shingles) Vaccine (1 of 2) Never done   Pneumonia Vaccine (1 - PCV) Never done   Flu Shot  05/18/2022*   Eye exam for diabetics  06/20/2022   Hemoglobin A1C  07/07/2022   Yearly kidney function blood test for diabetes  01/07/2023   Complete foot exam   01/14/2023   Yearly kidney health urinalysis for diabetes  01/15/2023   Medicare Annual Wellness Visit  03/11/2023   Colon Cancer Screening  12/01/2023   Hepatitis C Screening: USPSTF Recommendation to screen - Ages 18-79 yo.  Completed   HPV Vaccine  Aged Out  *Topic was postponed. The date shown is not the original due date.

## 2022-03-17 ENCOUNTER — Other Ambulatory Visit: Payer: Self-pay | Admitting: *Deleted

## 2022-03-17 DIAGNOSIS — I6523 Occlusion and stenosis of bilateral carotid arteries: Secondary | ICD-10-CM

## 2022-03-27 ENCOUNTER — Ambulatory Visit: Payer: Medicare HMO | Admitting: Physician Assistant

## 2022-03-27 ENCOUNTER — Ambulatory Visit (HOSPITAL_COMMUNITY)
Admission: RE | Admit: 2022-03-27 | Discharge: 2022-03-27 | Disposition: A | Discharge status: Home / Self Care | Payer: Medicare HMO | Source: Ambulatory Visit | Attending: Vascular Surgery | Admitting: Vascular Surgery

## 2022-03-27 VITALS — BP 133/73 | HR 58 | Temp 98.3°F | Ht 75.0 in | Wt 197.0 lb

## 2022-03-27 DIAGNOSIS — I6523 Occlusion and stenosis of bilateral carotid arteries: Secondary | ICD-10-CM

## 2022-03-27 NOTE — Progress Notes (Signed)
Established Carotid Patient   History of Present Illness   Albert Morris is a 72 y.o. (08/12/1950) male who presents for surveillance of carotid artery stenosis.  He has a history of left carotid endarterectomy for symptomatic carotid artery stenosis on 08/01/2016 by Dr. Scot Dock.  He was last seen in January 2023 at that time he was doing well without any stroke symptoms.  At follow-up today he is doing well.  He did denies any pain in his legs or feet.  He denies any strokelike symptoms.  He continues to walk anywhere from 1/2-1 mile most days of the week.  He is still taking his daily aspirin and statin.  Current Outpatient Medications  Medication Sig Dispense Refill   aspirin EC 81 MG tablet Take 1 tablet (81 mg total) by mouth daily. 90 tablet 3   atorvastatin (LIPITOR) 80 MG tablet Take 1 tablet (80 mg total) by mouth daily. 90 tablet 3   carvedilol (COREG) 3.125 MG tablet Take 1 tablet (3.125 mg total) by mouth 2 (two) times daily with a meal. 180 tablet 3   Cholecalciferol (VITAMIN D) 50 MCG (2000 UT) CAPS Take 1 capsule by mouth daily.     clopidogrel (PLAVIX) 75 MG tablet TAKE 1 TABLET(75 MG) BY MOUTH DAILY 90 tablet 1   diclofenac Sodium (VOLTAREN) 1 % GEL Apply 4 g topically 4 (four) times daily. 150 g 0   hydrochlorothiazide (HYDRODIURIL) 25 MG tablet Take 1 tablet (25 mg total) by mouth daily. 90 tablet 3   lisinopril (ZESTRIL) 20 MG tablet TAKE 1 TABLET(20 MG) BY MOUTH DAILY 90 tablet 3   TART CHERRY PO Take by mouth.     No current facility-administered medications for this visit.    REVIEW OF SYSTEMS (negative unless checked):   Cardiac:  '[]'$  Chest pain or chest pressure? '[]'$  Shortness of breath upon activity? '[]'$  Shortness of breath when lying flat? '[]'$  Irregular heart rhythm?  Vascular:  '[]'$  Pain in calf, thigh, or hip brought on by walking? '[]'$  Pain in feet at night that wakes you up from your sleep? '[]'$  Blood clot in your veins? '[]'$  Leg  swelling?  Pulmonary:  '[]'$  Oxygen at home? '[]'$  Productive cough? '[]'$  Wheezing?  Neurologic:  '[]'$  Sudden weakness in arms or legs? '[]'$  Sudden numbness in arms or legs? '[]'$  Sudden onset of difficult speaking or slurred speech? '[]'$  Temporary loss of vision in one eye? '[]'$  Problems with dizziness?  Gastrointestinal:  '[]'$  Blood in stool? '[]'$  Vomited blood?  Genitourinary:  '[]'$  Burning when urinating? '[]'$  Blood in urine?  Psychiatric:  '[]'$  Major depression  Hematologic:  '[]'$  Bleeding problems? '[]'$  Problems with blood clotting?  Dermatologic:  '[]'$  Rashes or ulcers?  Constitutional:  '[]'$  Fever or chills?  Ear/Nose/Throat:  '[]'$  Change in hearing? '[]'$  Nose bleeds? '[]'$  Sore throat?  Musculoskeletal:  '[]'$  Back pain? '[]'$  Joint pain? '[]'$  Muscle pain?   Physical Examination   Vitals:   03/27/22 1015 03/27/22 1018  BP: (!) 146/86 133/73  Pulse: (!) 58   Temp: 98.3 F (36.8 C)   TempSrc: Temporal   SpO2: 98%   Weight: 197 lb (89.4 kg)   Height: '6\' 3"'$  (1.905 m)    Body mass index is 24.62 kg/m.  General:  WDWN in NAD; vital signs documented above Gait: Not observed HENT: WNL, normocephalic Pulmonary: normal non-labored breathing  Cardiac: Regular rate and rhythm, no carotid bruit Abdomen: soft, NT, no masses Skin: without rashes Vascular Exam/Pulses: Palpable and equal  radial pulses bilaterally Extremities: without ischemic changes, without Gangrene , without cellulitis; without open wounds;  Musculoskeletal: no muscle wasting or atrophy  Neurologic: A&O X 3;  No focal weakness or paresthesias are detected Psychiatric:  The pt has Normal affect.  Non-Invasive Vascular Imaging   B Carotid Duplex (03/27/2022):  R ICA stenosis:  1-39% R VA:  patent and antegrade L ICA stenosis:  1-39% L VA:  patent and antegrade   Medical Decision Making   Albert Morris is a 72 y.o. male who presents for surveillance of carotid artery stenosis  Based on the patient's vascular studies,  his bilateral carotid artery stenosis unchanged at 1 to 39% He denies any strokelike symptoms.  He also denies claudication or rest pain He has palpable and equal radial pulses bilaterally.  No carotid bruit heard on exam He is still taking his daily aspirin and statin.  He can follow-up with our office in 1 year with repeat bilateral carotid duplex study   Vicente Serene PA-C [facility]: [phone]  Call MD: Virl Cagey

## 2022-03-30 NOTE — Progress Notes (Unsigned)
Cardiology Office Note   Date:  03/31/2022   ID:  Albert Morris, DOB 1950-11-10, MRN [id number]  PCP:  Lindell Spar, MD  Cardiologist:   [name], MD   Patient presents for f/u of of HFmrEF   History of Present Illness: Albert Morris is a 72 y.o. male with a history of CVA in 2018; L CEA 2018, mild LV dysfunction LVEF 45 to 50% with basal inferoseptal akinesis,   Myovue in 07/03/16 showed inferior    (moderate intensity base to apex)  LVEF 48%  Echo in April 2021 showed LVEF normal    I saw the pt in clinic in April 2023   Pt denies CP  Breathing is OK  No palpitaitons   NO dizziness   /no syncope  Walks about 1.5 miles a few times per week   Br  Banana  Apple   Cereal  Drinks water  [employer]    chicken   Dinner  Veggies and chicken    Current Meds  Medication Sig   aspirin EC 81 MG tablet Take 1 tablet (81 mg total) by mouth daily.   atorvastatin (LIPITOR) 80 MG tablet Take 1 tablet (80 mg total) by mouth daily.   carvedilol (COREG) 3.125 MG tablet Take 1 tablet (3.125 mg total) by mouth 2 (two) times daily with a meal.   Cholecalciferol (VITAMIN D) 50 MCG (2000 UT) CAPS Take 1 capsule by mouth daily.   clopidogrel (PLAVIX) 75 MG tablet TAKE 1 TABLET(75 MG) BY MOUTH DAILY   diclofenac Sodium (VOLTAREN) 1 % GEL Apply 4 g topically 4 (four) times daily.   hydrochlorothiazide (HYDRODIURIL) 25 MG tablet Take 1 tablet (25 mg total) by mouth daily.   lisinopril (ZESTRIL) 20 MG tablet TAKE 1 TABLET(20 MG) BY MOUTH DAILY   TART CHERRY PO Take by mouth.     Allergies:   Blood-group specific substance, Diclofenac sodium-menthol gel [diclofenac sodium-menthol crm], and Prednisone   Past Medical History:  Diagnosis Date   Cerebellar stroke ([place]) 06/24/2016   Admitted with dizziness and "falling to the left" 06/24/16. Work up revealed multifocal acute ischemia within the right cerebellar hemisphere with occlusion versus severe stenosis of the right PICA and  extensive, multifocal intracranial atherosclerosis with moderate stenoses of multiple distal MCA branches.   Colon polyps    CVA (cerebral vascular accident) ([place]) 06/21/2016   Diabetes mellitus without complication ([place]) [id number]   Hyperlipidemia    Hypertension    Myocardial infarction ([place])    Pneumonia    Substance abuse ([place])    hx of EtOH and MJ    Past Surgical History:  Procedure Laterality Date   CAROTID ENDARTERECTOMY Left    COLON SURGERY  [phone]   growth removed, Laparoscopic Vs. open Left hemicolectomy Dr. Lovena Neighbours 09-25-17   COLONOSCOPY N/A 06/26/2017   Procedure: COLONOSCOPY;  Surgeon: [name], MD;  [facility];  Service: Endoscopy;  Laterality: N/A;  730   COLONOSCOPY N/A 12/01/2018   Procedure: COLONOSCOPY;  Surgeon: [name], MD;  [facility];  Service: Endoscopy;  Laterality: N/A;  930   COLONOSCOPY WITH PROPOFOL N/A 10/07/2017   Procedure: COLONOSCOPY WITH PROPOFOL WITH TATTOO;  Surgeon: [name], MD;  Location: [facility];  Service: General;  Laterality: N/A;   ENDARTERECTOMY Left 08/01/2016   Procedure: ENDARTERECTOMY LEFT CAROTID;  Surgeon: [name], MD;  Location: [facility];  Service: Vascular;  Laterality:  Left;   FLEXIBLE SIGMOIDOSCOPY N/A 10/08/2017   Procedure: FLEXIBLE SIGMOIDOSCOPY;  Surgeon: [name], MD;  Location: [facility];  Service: General;  Laterality: N/A;   LAPAROSCOPIC RIGHT HEMI COLECTOMY Left 10/08/2017   Procedure: LAPAROSCOPIC VERSES OPEN LEFT HEMI COLECTOMY ERAS PATHWAY;  Surgeon: [name], MD;  Location: [facility];  Service: General;  Laterality: Left;   PATCH ANGIOPLASTY Left 08/01/2016   Procedure: PATCH ANGIOPLASTY LEFT CAROTID ARTERY USING ZENOSURE BIOLOGIC PATCH;  Surgeon: [name], MD;  Location: [facility];  Service: Vascular;  Laterality: Left;   POLYPECTOMY  12/01/2018   Procedure: POLYPECTOMY;  Surgeon: [name], MD;  [facility];  Service: Endoscopy;;   SUBMUCOSAL INJECTION  10/07/2017   Procedure: SUBMUCOSAL INJECTION;  Surgeon: [name], MD;  Location: [facility];  Service: General;;   TONSILLECTOMY       Social History:  The patient  reports that he quit smoking about 18 years ago. His smoking use included cigarettes. He has a 14.50 pack-year smoking history. He has never used smokeless tobacco. He reports that he does not currently use alcohol. He reports that he does not currently use drugs.   Family History:  The patient's family history includes Alcohol abuse in his maternal uncle; Arthritis in his mother; Breast cancer in his mother; CAD in his mother; CVA in his mother; Cancer in his brother, brother, and mother; Early death in his father; Hypertension in his mother; Other in his father; Stroke in his maternal aunt and maternal grandfather.    ROS:  Please see the history of present illness. All other systems are reviewed and  Negative to the above problem except as noted.    PHYSICAL EXAM: VS:  BP 124/62   Pulse 65   Ht 6' 3"$  (1.905 m)   Wt 201 lb 12.8 oz (91.5 kg)   SpO2 95%   BMI 25.22 kg/m   GEN: Well nourished, well developed, in no acute distress  HEENT: normal  Neck: no JVD, carotid bruit Cardiac: RRR; II/VI systolic murmur base.  No LE  edema  Respiratory:  clear to auscultation GI: soft, nontender, nondistended, + BS  No hepatomegaly  MS: no deformity Moving all extremities   Skin: warm and dry, no rash Neuro:  Strength and sensation are intact Psych: euthymic mood, full affect 5 bpm   LVH with repolartization abnormality   Occasional [facility]  Myovue May 2018  No diagnostic ST segment changes over baseline abnormalities. Rare PVC. Medium sized, moderate intensity, inferior defect from apex to base that is fixed, more prominent on rest imaging than stress. Most consistent with scar, although there could be a component of soft tissue attenuation. There are no large  ischemic territories noted. This is an intermediate risk study. Nuclear stress EF: 48%.     Echo   05/25/19  1. Left ventricular ejection fraction, by estimation, is 65 to 70%. The left ventricle has normal function. The left ventricle has no regional wall motion abnormalities. There is moderate left ventricular hypertrophy. Left ventricular diastolic parameters are consistent with Grade I diastolic dysfunction (impaired relaxation). 2. Right ventricular systolic function is normal. The right ventricular size is normal. There is mildly elevated pulmonary artery systolic pressure. 3. The mitral valve is normal in structure. No evidence of mitral valve regurgitation. No evidence of mitral stenosis. 4. The aortic valve has an indeterminant number of cusps. Aortic valve regurgitation is mild to moderate. No aortic stenosis is present. 5.  Aortic dilatation noted. There is mild dilatation of the ascending aorta measuring 40 mm. 6. The inferior vena cava is normal in size with greater than 50% respiratory variability, suggesting right atrial pressure of 3 mmHg.  Carotid USN  Jan 2022  Left Carotid: Patent carotid endarterectomy with velocity in the left ICA consistent with a 1-39% stenosis. Vertebrals: Bilateral vertebral arteries demonstrate antegrade flow. The right vertebral artery somewhat difficult to visualize Subclavians: Normal flow hemodynamics were seen in bilateral subclavian arteries.  Lipid Panel    Component Value Date/Time   CHOL 159 09/04/2021 0815   TRIG 66 09/04/2021 0815   HDL 48 09/04/2021 0815   CHOLHDL 3.3 09/04/2021 0815   CHOLHDL 4.1 05/21/2020 1109   VLDL 13 09/17/2016 0720   LDLCALC 98 09/04/2021 0815   LDLCALC 86 05/21/2020 1109      Wt Readings from Last 3 Encounters:  03/31/22 201 lb 12.8 oz (91.5 kg)  03/27/22 197 lb (89.4 kg)  03/10/22 198 lb 1.3 oz (89.8 kg)      ASSESSMENT AND PLAN:  1  Hx of LV dysfunction    Last echo in 2021 LVEF normal        2  Hx CV dz   S/p LCEA   Follow with vascular  3  HL   LDL In JUly 98    Will add Zetia and follow up in 8 wks  Keep on lipitor  4  HTN  BP is well controlled    5  AV dz  Pt with mild to mod insuff in 2021   Set to have echo in fall and I will see him after    Stay active   F/U next fall    Current medicines are reviewed at length with the patient today.  The patient does not have concerns regarding medicines.  Signed, [name], MD  03/31/2022 4:05 PM    [facility] [address], [place], [place]  [address] Phone: [phone]; Fax: [phone]

## 2022-03-31 ENCOUNTER — Ambulatory Visit: Payer: Medicare HMO | Attending: Internal Medicine | Admitting: Internal Medicine

## 2022-03-31 ENCOUNTER — Encounter: Payer: Self-pay | Admitting: Internal Medicine

## 2022-03-31 VITALS — BP 124/62 | HR 65 | Ht 75.0 in | Wt 201.8 lb

## 2022-03-31 DIAGNOSIS — I700 Atherosclerosis of aorta: Secondary | ICD-10-CM | POA: Diagnosis not present

## 2022-03-31 MED ORDER — EZETIMIBE 10 MG PO TABS: 10.0000 mg | ORAL_TABLET | Freq: Every day | ORAL | 3 refills | Status: DC

## 2022-03-31 NOTE — Patient Instructions (Signed)
Medication Instructions:   Start Zetia 10 mg Daily   *If you need a refill on your cardiac medications before your next appointment, please call your pharmacy*   Lab Work: Your physician recommends that you return for lab work in: Have your PCP send lab results to [phone].   If you have labs (blood work) drawn today and your tests are completely normal, you will receive your results only by: [place] (if you have MyChart) OR A paper copy in the mail If you have any lab test that is abnormal or we need to change your treatment, we will call you to review the results.   Testing/Procedures: Your physician has requested that you have an echocardiogram. Echocardiography is a painless test that uses sound waves to create images of your heart. It provides your doctor with information about the size and shape of your heart and how well your heart's chambers and valves are working. This procedure takes approximately one hour. There are no restrictions for this procedure. Please do NOT wear cologne, perfume, aftershave, or lotions (deodorant is allowed). Please arrive 15 minutes prior to your appointment time.    Follow-Up: At [facility], you and your health needs are our priority.  As part of our continuing mission to provide you with exceptional heart care, we have created designated Provider Care Teams.  These Care Teams include your primary Cardiologist (physician) and Advanced Practice Providers (APPs -  Physician Assistants and Nurse Practitioners) who all work together to provide you with the care you need, when you need it.  We recommend signing up for the patient portal called "MyChart".  Sign up information is provided on this After Visit Summary.  MyChart is used to connect with patients for Virtual Visits (Telemedicine).  Patients are able to view lab/test results, encounter notes, upcoming appointments, etc.  Non-urgent messages can be sent to your provider as  well.   To learn more about what you can do with MyChart, go to [email or web address].    Your next appointment:    October   Provider:   [name], MD    Other Instructions Thank you for choosing [facility]!

## 2022-04-21 ENCOUNTER — Ambulatory Visit: Payer: Medicare HMO | Admitting: Podiatry

## 2022-04-21 ENCOUNTER — Encounter: Payer: Self-pay | Admitting: Podiatry

## 2022-04-21 DIAGNOSIS — M79674 Pain in right toe(s): Secondary | ICD-10-CM

## 2022-04-21 DIAGNOSIS — E119 Type 2 diabetes mellitus without complications: Secondary | ICD-10-CM

## 2022-04-21 DIAGNOSIS — M79675 Pain in left toe(s): Secondary | ICD-10-CM

## 2022-04-21 DIAGNOSIS — B351 Tinea unguium: Secondary | ICD-10-CM

## 2022-04-21 NOTE — Progress Notes (Signed)
This patient returns to my office for at risk foot care.  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  Patient is taking plavix.  This patient is unable to cut nails himself since the patient cannot reach his nails.These nails are painful walking and wearing shoes.  This patient presents for at risk foot care today.  General Appearance  Alert, conversant and in no acute stress.  Vascular  Dorsalis pedis and posterior tibial  pulses are weakly  palpable  bilaterally.  Capillary return is within normal limits  bilaterally. Temperature is within normal limits  bilaterally.  Neurologic  Senn-Weinstein monofilament wire test within normal limits/diminished   bilaterally. Muscle power within normal limits bilaterally.  Nails Thick disfigured discolored nails with subungual debris  from hallux to fifth toes bilaterally. No evidence of bacterial infection or drainage bilaterally.  Orthopedic  No limitations of motion  feet .  No crepitus or effusions noted.  No bony pathology or digital deformities noted. HAV  B/L.  Skin  normotropic skin with no porokeratosis noted bilaterally.  No signs of infections or ulcers noted.     Onychomycosis  Pain in right toes  Pain in left toes  Consent was obtained for treatment procedures.   Mechanical debridement of nails 1-5  bilaterally performed with a nail nipper.  Filed with dremel without incident.    Return office visit   3 months                  Told patient to return for periodic foot care and evaluation due to potential at risk complications.   Gardiner Barefoot DPM

## 2022-05-08 DIAGNOSIS — I6523 Occlusion and stenosis of bilateral carotid arteries: Secondary | ICD-10-CM | POA: Diagnosis not present

## 2022-05-08 DIAGNOSIS — E782 Mixed hyperlipidemia: Secondary | ICD-10-CM | POA: Diagnosis not present

## 2022-05-08 DIAGNOSIS — N1831 Chronic kidney disease, stage 3a: Secondary | ICD-10-CM | POA: Diagnosis not present

## 2022-05-08 DIAGNOSIS — E1159 Type 2 diabetes mellitus with other circulatory complications: Secondary | ICD-10-CM | POA: Diagnosis not present

## 2022-05-09 LAB — CBC WITH DIFFERENTIAL/PLATELET
Basophils Absolute: 0.1 10*3/uL (ref 0.0–0.2)
Basos: 1 %
EOS (ABSOLUTE): 0.4 10*3/uL (ref 0.0–0.4)
Eos: 7 %
Hematocrit: 35.1 % — ABNORMAL LOW (ref 37.5–51.0)
Hemoglobin: 12 g/dL — ABNORMAL LOW (ref 13.0–17.7)
Immature Grans (Abs): 0 10*3/uL (ref 0.0–0.1)
Immature Granulocytes: 0 %
Lymphocytes Absolute: 1.6 10*3/uL (ref 0.7–3.1)
Lymphs: 28 %
MCH: 28.7 pg (ref 26.6–33.0)
MCHC: 34.2 g/dL (ref 31.5–35.7)
MCV: 84 fL (ref 79–97)
Monocytes Absolute: 0.6 10*3/uL (ref 0.1–0.9)
Monocytes: 12 %
Neutrophils Absolute: 2.9 10*3/uL (ref 1.4–7.0)
Neutrophils: 52 %
Platelets: 312 10*3/uL (ref 150–450)
RBC: 4.18 x10E6/uL (ref 4.14–5.80)
RDW: 12.6 % (ref 11.6–15.4)
WBC: 5.5 10*3/uL (ref 3.4–10.8)

## 2022-05-09 LAB — LIPID PANEL
Chol/HDL Ratio: 3.8 ratio (ref 0.0–5.0)
Cholesterol, Total: 176 mg/dL (ref 100–199)
HDL: 46 mg/dL (ref >39)
LDL Chol Calc (NIH): 116 mg/dL — ABNORMAL HIGH (ref 0–99)
Triglycerides: 75 mg/dL (ref 0–149)
VLDL Cholesterol Cal: 14 mg/dL (ref 5–40)

## 2022-05-09 LAB — CMP14+EGFR
ALT: 21 IU/L (ref 0–44)
AST: 23 IU/L (ref 0–40)
Albumin/Globulin Ratio: 1.6 (ref 1.2–2.2)
Albumin: 4.3 g/dL (ref 3.8–4.8)
Alkaline Phosphatase: 83 IU/L (ref 44–121)
BUN/Creatinine Ratio: 15 (ref 10–24)
BUN: 21 mg/dL (ref 8–27)
Bilirubin Total: 0.3 mg/dL (ref 0.0–1.2)
CO2: 23 mmol/L (ref 20–29)
Calcium: 9.5 mg/dL (ref 8.6–10.2)
Chloride: 99 mmol/L (ref 96–106)
Creatinine, Ser: 1.37 mg/dL — ABNORMAL HIGH (ref 0.76–1.27)
Globulin, Total: 2.7 g/dL (ref 1.5–4.5)
Glucose: 110 mg/dL — ABNORMAL HIGH (ref 70–99)
Potassium: 4.9 mmol/L (ref 3.5–5.2)
Sodium: 137 mmol/L (ref 134–144)
Total Protein: 7 g/dL (ref 6.0–8.5)
eGFR: 55 mL/min/{1.73_m2} — ABNORMAL LOW (ref >59)

## 2022-05-09 LAB — HEMOGLOBIN A1C
Est. average glucose Bld gHb Est-mCnc: 146 mg/dL
Hgb A1c MFr Bld: 6.7 % — ABNORMAL HIGH (ref 4.8–5.6)

## 2022-05-11 LAB — MICROALBUMIN / CREATININE URINE RATIO
Creatinine, Urine: 112.6 mg/dL
Microalb/Creat Ratio: 10 mg/g creat (ref 0–29)
Microalbumin, Urine: 11.6 ug/mL

## 2022-05-15 ENCOUNTER — Ambulatory Visit (INDEPENDENT_AMBULATORY_CARE_PROVIDER_SITE_OTHER): Payer: Medicare HMO | Admitting: Internal Medicine

## 2022-05-15 ENCOUNTER — Encounter: Payer: Self-pay | Admitting: Internal Medicine

## 2022-05-15 VITALS — BP 137/70 | HR 71 | Ht 75.0 in | Wt 200.8 lb

## 2022-05-15 DIAGNOSIS — N1831 Chronic kidney disease, stage 3a: Secondary | ICD-10-CM | POA: Diagnosis not present

## 2022-05-15 DIAGNOSIS — I739 Peripheral vascular disease, unspecified: Secondary | ICD-10-CM | POA: Insufficient documentation

## 2022-05-15 DIAGNOSIS — E1159 Type 2 diabetes mellitus with other circulatory complications: Secondary | ICD-10-CM | POA: Diagnosis not present

## 2022-05-15 DIAGNOSIS — I25118 Atherosclerotic heart disease of native coronary artery with other forms of angina pectoris: Secondary | ICD-10-CM | POA: Diagnosis not present

## 2022-05-15 DIAGNOSIS — E782 Mixed hyperlipidemia: Secondary | ICD-10-CM | POA: Diagnosis not present

## 2022-05-15 DIAGNOSIS — I10 Essential (primary) hypertension: Secondary | ICD-10-CM

## 2022-05-15 DIAGNOSIS — I700 Atherosclerosis of aorta: Secondary | ICD-10-CM

## 2022-05-15 DIAGNOSIS — M1000 Idiopathic gout, unspecified site: Secondary | ICD-10-CM | POA: Insufficient documentation

## 2022-05-15 DIAGNOSIS — M10071 Idiopathic gout, right ankle and foot: Secondary | ICD-10-CM | POA: Insufficient documentation

## 2022-05-15 DIAGNOSIS — I5032 Chronic diastolic (congestive) heart failure: Secondary | ICD-10-CM | POA: Diagnosis not present

## 2022-05-15 MED ORDER — DAPAGLIFLOZIN PROPANEDIOL 5 MG PO TABS: 5.0000 mg | ORAL_TABLET | Freq: Every day | ORAL | 3 refills | Status: DC

## 2022-05-15 MED ORDER — LISINOPRIL 40 MG PO TABS: 40.0000 mg | ORAL_TABLET | Freq: Every day | ORAL | 1 refills | Status: DC

## 2022-05-15 MED ORDER — COLCHICINE 0.6 MG PO TABS: 0.6000 mg | ORAL_TABLET | Freq: Every day | ORAL | 0 refills | Status: DC

## 2022-05-15 NOTE — Assessment & Plan Note (Signed)
Likely from HTN, DM and age related decline On lisinopril for HTN Added Farxiga for CKD and HFpEF Avoid nephrotoxic agents Advised to improve fluid intake to at least 64 ounces of fluid in a day

## 2022-05-15 NOTE — Assessment & Plan Note (Signed)
Lab Results  Component Value Date   HGBA1C 6.7 (H) 05/08/2022   Diet controlled Had added Farxiga for HFpEF and CKD - agrees to take it now, patient assistance form provided Advised to follow diabetic diet On statin and ACEi F/u CMP and lipid panel Diabetic eye exam: Advised to follow up with Ophthalmology for diabetic eye exam

## 2022-05-15 NOTE — Progress Notes (Signed)
Established Patient Office Visit  Subjective:  Patient ID: Albert Morris, male    DOB: 08/15/1950  Age: 72 y.o. MRN: [id number]  CC:  Chief Complaint  Patient presents with   Diabetes    Four month follow up for diabetes and CKD    HPI [name] is a 72 y.o. male with past medical history of HTN, CAD, type II DM with HLD, CVA and tobacco abuse who presents for f/u of his chronic medical conditions.  HTN: BP is well-controlled. Takes medications regularly. Patient denies headache, dizziness, chest pain, dyspnea or palpitations.   HLD: He has been taking Lipitor and Zetia now, but not taking Lipitor regularly.   Type 2 DM: His HbA1C is overall stable at 6.7. Diet controlled. Does not prefer to take medicine for it. Denies any polyuria or polyphagia.  He was given [place] for his history of HFpEF and CKD, but he did not start taking it initially as he states that he was told by his pharmacist not to take it considering its side effects (?).  I had extensive discussion with him about its potential benefits in his case and he agreed to take it, but later did not pick it up due to cost concern.  Gout: He has had episodes of gout flareup on his right toe despite following low purine diet.  Of note, he currently takes HCTZ for HTN.  He also reports blackish discoloration of the right foot, but denies any acute numbness or tingling of the LE currently.  He has leg pain, which is chronic and gets worse upon walking.  He sees vascular surgery for history of carotid artery stenosis.     Past Medical History:  Diagnosis Date   Cerebellar stroke ([place]) 06/24/2016   Admitted with dizziness and "falling to the left" 06/24/16. Work up revealed multifocal acute ischemia within the right cerebellar hemisphere with occlusion versus severe stenosis of the right PICA and extensive, multifocal intracranial atherosclerosis with moderate stenoses of multiple distal MCA branches.   Colon polyps     CVA (cerebral vascular accident) ([place]) 06/21/2016   Diabetes mellitus without complication ([name]) [id number]   Hyperlipidemia    Hypertension    Myocardial infarction ([place])    Pneumonia    Substance abuse ([place])    hx of EtOH and MJ    Past Surgical History:  Procedure Laterality Date   CAROTID ENDARTERECTOMY Left    COLON SURGERY  [phone]   growth removed, Laparoscopic Vs. open Left hemicolectomy Dr. Lovena Neighbours 09-25-17   COLONOSCOPY N/A 06/26/2017   Procedure: COLONOSCOPY;  Surgeon: [name], MD;  [facility];  Service: Endoscopy;  Laterality: N/A;  730   COLONOSCOPY N/A 12/01/2018   Procedure: COLONOSCOPY;  Surgeon: [name], MD;  [facility];  Service: Endoscopy;  Laterality: N/A;  930   COLONOSCOPY WITH PROPOFOL N/A 10/07/2017   Procedure: COLONOSCOPY WITH PROPOFOL WITH TATTOO;  Surgeon: [name], MD;  Location: [facility];  Service: General;  Laterality: N/A;   ENDARTERECTOMY Left 08/01/2016   Procedure: ENDARTERECTOMY LEFT CAROTID;  Surgeon: [name], MD;  Location: [facility];  Service: Vascular;  Laterality: Left;   FLEXIBLE SIGMOIDOSCOPY N/A 10/08/2017   Procedure: FLEXIBLE SIGMOIDOSCOPY;  Surgeon: [name], MD;  Location: [facility];  Service: General;  Laterality: N/A;   LAPAROSCOPIC RIGHT HEMI COLECTOMY Left 10/08/2017   Procedure: LAPAROSCOPIC VERSES OPEN LEFT HEMI COLECTOMY ERAS PATHWAY;  Surgeon: [name], MD;  Location: [facility];  Service: General;  Laterality: Left;   PATCH ANGIOPLASTY Left 08/01/2016   Procedure: PATCH ANGIOPLASTY LEFT CAROTID ARTERY USING ZENOSURE BIOLOGIC PATCH;  Surgeon: [name], MD;  [facility];  Service: Vascular;  Laterality: Left;   POLYPECTOMY  12/01/2018   Procedure: POLYPECTOMY;  Surgeon: [name], MD;  [facility];  Service: Endoscopy;;   SUBMUCOSAL INJECTION  10/07/2017   Procedure: SUBMUCOSAL INJECTION;  Surgeon: [name], MD;  Location: [facility];  Service: General;;   TONSILLECTOMY      Family History  Problem Relation Age of Onset   Cancer Mother    Breast cancer Mother        lived to be 54   CVA Mother    CAD Mother    Arthritis Mother    Hypertension Mother    Other Father        gunshot wound   Early death Father        GSW   Cancer Brother        Stomach Cancer   Cancer Brother        Lung Cancer   Stroke Maternal Aunt    Alcohol abuse Maternal Uncle    Stroke Maternal Grandfather    Colon cancer Neg Hx     Social History   Socioeconomic History   Marital status: Divorced    Spouse name: Not on file   Number of children: 2   Years of education: 12   Highest education level: Not on file  Occupational History   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; 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[place], [place], [place], [place], [place], [place], [place], [place]    Comment: alcoholic  Tobacco Use   Smoking status: Former    Packs/day: 0.50    Years: 29.00    Additional pack years: 0.00    Total pack years: 14.50    Types: Cigarettes    Quit date: 2006    Years since quitting: 18.2   Smokeless tobacco: Never  Vaping Use   Vaping Use: Never used  Substance and Sexual Activity   Alcohol use: Not Currently    Comment: quit 2006   Drug use: Not Currently    Comment: remote h/o heavy marijuana use, also used cocaine and others   Sexual activity: Not Currently  Other Topics Concern   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, EMT certified      Lives alone   Likes to walk      2 sons -in [place] and [place]   Social Determinants of Health   Financial Resource Strain: Low Risk  (03/10/2022)   Overall Financial Resource Strain (CARDIA)    Difficulty of Paying Living Expenses: Not hard at all  Food Insecurity: No Food Insecurity (03/10/2022)   Hunger Vital Sign    Worried About Running Out of Food in the Last Year: Never true    [place] in the Last  Year: Never true  Transportation Needs: No Transportation Needs (03/10/2022)   PRAPARE - Hydrologist (Medical): No    Lack of Transportation (Non-Medical): No  Physical Activity: Sufficiently Active (03/10/2022)   Exercise Vital Sign    Days of Exercise per Week: 7 days    Minutes of Exercise per Session: 30 min  Stress: No Stress Concern Present (03/10/2022)   [place]  Stress Questionnaire    Feeling of Stress : Not at all  Social Connections: Moderately Isolated (03/10/2022)   Social Connection and Isolation Panel [NHANES]    Frequency of Communication with Friends and Family: More than three times a week    Frequency of Social Gatherings with Friends and Family: More than three times a week    Attends Religious Services: More than 4 times per year    Active Member of [employer] or Organizations: No    Attends Archivist Meetings: Never    Marital Status: Divorced  Human resources officer Violence: Not At Risk (03/10/2022)   Humiliation, Afraid, Rape, and Kick questionnaire    Fear of Current or Ex-Partner: No    Emotionally Abused: No    Physically Abused: No    Sexually Abused: No    Outpatient Medications Prior to Visit  Medication Sig Dispense Refill   aspirin EC 81 MG tablet Take 1 tablet (81 mg total) by mouth daily. 90 tablet 3   atorvastatin (LIPITOR) 80 MG tablet Take 1 tablet (80 mg total) by mouth daily. 90 tablet 3   carvedilol (COREG) 3.125 MG tablet Take 1 tablet (3.125 mg total) by mouth 2 (two) times daily with a meal. 180 tablet 3   Cholecalciferol (VITAMIN D) 50 MCG (2000 UT) CAPS Take 1 capsule by mouth daily.     clopidogrel (PLAVIX) 75 MG tablet TAKE 1 TABLET(75 MG) BY MOUTH DAILY 90 tablet 1   diclofenac Sodium (VOLTAREN) 1 % GEL Apply 4 g topically 4 (four) times daily. 150 g 0   TART CHERRY PO Take by mouth.     ezetimibe (ZETIA) 10 MG tablet Take 1 tablet (10 mg total) by mouth daily.  90 tablet 3   hydrochlorothiazide (HYDRODIURIL) 25 MG tablet Take 1 tablet (25 mg total) by mouth daily. 90 tablet 3   lisinopril (ZESTRIL) 20 MG tablet TAKE 1 TABLET(20 MG) BY MOUTH DAILY 90 tablet 3   No facility-administered medications prior to visit.    Allergies  Allergen Reactions   Blood-Group Specific Substance     No whole blood products   Diclofenac Sodium-Menthol Gel [Diclofenac Sodium-Menthol Crm]     Shortness of breath   Prednisone     Elevates blood pressure    ROS Review of Systems  Constitutional:  Negative for chills and fever.  HENT:  Negative for congestion and sore throat.   Eyes:  Negative for pain and discharge.  Respiratory:  Negative for cough and shortness of breath.   Cardiovascular:  Negative for chest pain and palpitations.  Gastrointestinal:  Negative for constipation, diarrhea, nausea and vomiting.  Endocrine: Negative for polydipsia and polyuria.  Genitourinary:  Negative for dysuria and hematuria.  Musculoskeletal:  Positive for arthralgias (Right great toe). Negative for neck pain and neck stiffness.  Skin:  Negative for rash.  Neurological:  Negative for dizziness, weakness, numbness and headaches.  Psychiatric/Behavioral:  Negative for agitation and behavioral problems.       Objective:    Physical Exam Vitals reviewed.  Constitutional:      General: He is not in acute distress.    Appearance: He is not diaphoretic.  HENT:     Head: Normocephalic and atraumatic.     Nose: Nose normal.     Mouth/Throat:     Mouth: Mucous membranes are moist.  Eyes:     General: No scleral icterus.    Extraocular Movements: Extraocular movements intact.  Cardiovascular:     Rate and  Rhythm: Normal rate and regular rhythm.     Heart sounds: Normal heart sounds. No murmur heard.    Comments: Weak DPA pulse on right foot Pulmonary:     Breath sounds: Normal breath sounds. No wheezing or rales.  Musculoskeletal:     Cervical back: Neck supple. No  tenderness.     Right lower leg: No edema.     Left lower leg: No edema.  Skin:    General: Skin is warm.     Findings: No rash.     Comments: Blackish discoloration of the toes and around 1st MTP joint of right foot  Neurological:     General: No focal deficit present.     Mental Status: He is alert and oriented to person, place, and time.     Sensory: No sensory deficit.     Motor: No weakness.  Psychiatric:        Mood and Affect: Mood normal.        Behavior: Behavior normal.     BP 137/70 (BP Location: Right Arm, Patient Position: Sitting, Cuff Size: Large)   Pulse 71   Ht 6\' 3"  (1.905 m)   Wt 200 lb 12.8 oz (91.1 kg)   SpO2 96%   BMI 25.10 kg/m  Wt Readings from Last 3 Encounters:  05/15/22 200 lb 12.8 oz (91.1 kg)  03/31/22 201 lb 12.8 oz (91.5 kg)  03/27/22 197 lb (89.4 kg)    Lab Results  Component Value Date   TSH 1.490 09/04/2021   Lab Results  Component Value Date   WBC 5.5 05/08/2022   HGB 12.0 (L) 05/08/2022   HCT 35.1 (L) 05/08/2022   MCV 84 05/08/2022   PLT 312 05/08/2022   Lab Results  Component Value Date   NA 137 05/08/2022   K 4.9 05/08/2022   CO2 23 05/08/2022   GLUCOSE 110 (H) 05/08/2022   BUN 21 05/08/2022   CREATININE 1.37 (H) 05/08/2022   BILITOT 0.3 05/08/2022   ALKPHOS 83 05/08/2022   AST 23 05/08/2022   ALT 21 05/08/2022   PROT 7.0 05/08/2022   ALBUMIN 4.3 05/08/2022   CALCIUM 9.5 05/08/2022   ANIONGAP 11 10/12/2017   EGFR 55 (L) 05/08/2022   Lab Results  Component Value Date   CHOL 176 05/08/2022   Lab Results  Component Value Date   HDL 46 05/08/2022   Lab Results  Component Value Date   LDLCALC 116 (H) 05/08/2022   Lab Results  Component Value Date   TRIG 75 05/08/2022   Lab Results  Component Value Date   CHOLHDL 3.8 05/08/2022   Lab Results  Component Value Date   HGBA1C 6.7 (H) 05/08/2022      Assessment & Plan:   Problem List Items Addressed This Visit       Cardiovascular and Mediastinum    Essential hypertension    BP Readings from Last 1 Encounters:  05/15/22 137/70  Well-controlled now On lisinopril, HCTZ and Coreg Due to recurrent episodes of gout, discontinued HCTZ Increased dose of lisinopril to 40 mg daily Counseled for compliance with the medications Advised DASH diet and moderate exercise/walking as tolerated      Relevant Medications   lisinopril (ZESTRIL) 40 MG tablet   Other Relevant Orders   CMP14+EGFR   CBC with Differential/Platelet   Coronary artery disease of native artery of native heart with stable angina pectoris ([place])    Followed by cardiology On aspirin, statin and beta-blocker  Relevant Medications   lisinopril (ZESTRIL) 40 MG tablet   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[place]) - Primary    Lab Results  Component Value Date   HGBA1C 6.7 (H) 05/08/2022  Diet controlled Had added Farxiga for HFpEF and CKD - agrees to take it now, patient assistance form provided Advised to follow diabetic diet On statin and ACEi F/u CMP and lipid panel Diabetic eye exam: Advised to follow up with Ophthalmology for diabetic eye exam      Relevant Medications   lisinopril (ZESTRIL) 40 MG tablet   dapagliflozin propanediol (FARXIGA) 5 MG TABS tablet   Other Relevant Orders   Hemoglobin A1c   Atherosclerosis of aorta (HCC)    On aspirin and statin Needs to improve diet for better HTN control      Relevant Medications   lisinopril (ZESTRIL) 40 MG tablet   Chronic heart failure with preserved ejection fraction (HCC)    Last Echo reviewed On ACEi and B-blocker Added Farxiga, he did not take it as he was concerned about its side effects, after counseling he agrees to start taking it      Relevant Medications   lisinopril (ZESTRIL) 40 MG tablet   Other Relevant Orders   CMP14+EGFR   CBC with Differential/Platelet   PAD (peripheral artery disease) (HCC)    Has weak DPA pulse on RLE Has blackish discoloration of the toes  chronically, but warm extremities Has history of CAD, CVA and carotid artery stenosis Currently on DAPT and statin Check US arterial Doppler of LE       Relevant Medications   lisinopril (ZESTRIL) 40 MG tablet   Other Relevant Orders   [place] Lower Ext Art Bilat     Musculoskeletal and Integument   Acute idiopathic gout involving toe of right foot    Has recurrent flareup of gout of right great toe Discontinue HCTZ Avoid oral NSAIDs due to history of CKD, CAD and CVA Did not tolerate oral prednisone Colchicine for acute flareup      Relevant Medications   colchicine 0.6 MG tablet   Other Relevant Orders   Uric acid     Genitourinary   Stage 3a chronic kidney disease ([place])    Likely from HTN, DM and age related decline On lisinopril for HTN Added Farxiga for CKD and HFpEF Avoid nephrotoxic agents Advised to improve fluid intake to at least 64 ounces of fluid in a day      Relevant Medications   dapagliflozin propanediol (FARXIGA) 5 MG TABS tablet   Other Relevant Orders   CMP14+EGFR   CBC with Differential/Platelet     Other   Mixed hyperlipidemia    Lipid profile reviewed Continue Lipitor 80 mg every other day (has leg cramps with daily dosing) - needs to take it regularly Did not tolerate Zetia If persistent elevated LDL, will need Repatha or Praluent - cost can be a concern      Relevant Medications   lisinopril (ZESTRIL) 40 MG tablet   Meds ordered this encounter  Medications   lisinopril (ZESTRIL) 40 MG tablet    Sig: Take 1 tablet (40 mg total) by mouth daily.    Dispense:  90 tablet    Refill:  1   colchicine 0.6 MG tablet    Sig: Take 1 tablet (0.6 mg total) by mouth daily.    Dispense:  20 tablet    Refill:  0   dapagliflozin propanediol (FARXIGA) 5 MG TABS tablet  Sig: Take 1 tablet (5 mg total) by mouth daily.    Dispense:  30 tablet    Refill:  3    Follow-up: Return in about 4 months (around 09/14/2022) for Annual physical.    Lindell Spar, MD

## 2022-05-15 NOTE — Assessment & Plan Note (Signed)
Followed by cardiology On aspirin, statin and beta-blocker

## 2022-05-15 NOTE — Patient Instructions (Addendum)
Please stop taking Hydrochlorothiazide. Please start taking Lisinopril 40 mg instead of 20 mg.  Please take CoQ-10 200 mg once daily to get relief from leg cramps.  Please take Colchicine for gout flareup.  Please continue to follow low carb diet and perform moderate exercise/walking at least 150 mins/week.  Please get blood tests done before the next visit.

## 2022-05-15 NOTE — Assessment & Plan Note (Addendum)
BP Readings from Last 1 Encounters:  05/15/22 137/70   Well-controlled now On lisinopril, HCTZ and Coreg Due to recurrent episodes of gout, discontinued HCTZ Increased dose of lisinopril to 40 mg daily Counseled for compliance with the medications Advised DASH diet and moderate exercise/walking as tolerated

## 2022-05-15 NOTE — Assessment & Plan Note (Signed)
Last Echo reviewed On ACEi and B-blocker Added Farxiga, he did not take it as he was concerned about its side effects, after counseling he agrees to start taking it 

## 2022-05-15 NOTE — Assessment & Plan Note (Signed)
Has recurrent flareup of gout of right great toe Discontinue HCTZ Avoid oral NSAIDs due to history of CKD, CAD and CVA Did not tolerate oral prednisone Colchicine for acute flareup

## 2022-05-15 NOTE — Assessment & Plan Note (Signed)
Has weak DPA pulse on RLE Has blackish discoloration of the toes chronically, but warm extremities Has history of CAD, CVA and carotid artery stenosis Currently on DAPT and statin Check US arterial Doppler of LE

## 2022-05-15 NOTE — Assessment & Plan Note (Addendum)
On aspirin and statin Needs to improve diet for better HTN control

## 2022-05-15 NOTE — Assessment & Plan Note (Signed)
Lipid profile reviewed Continue Lipitor 80 mg every other day (has leg cramps with daily dosing) - needs to take it regularly Did not tolerate Zetia If persistent elevated LDL, will need Repatha or Praluent - cost can be a concern

## 2022-05-19 DIAGNOSIS — Z6825 Body mass index (BMI) 25.0-25.9, adult: Secondary | ICD-10-CM | POA: Diagnosis not present

## 2022-05-19 DIAGNOSIS — I10 Essential (primary) hypertension: Secondary | ICD-10-CM | POA: Diagnosis not present

## 2022-05-19 DIAGNOSIS — E785 Hyperlipidemia, unspecified: Secondary | ICD-10-CM | POA: Diagnosis not present

## 2022-05-19 DIAGNOSIS — Z0000 Encounter for general adult medical examination without abnormal findings: Secondary | ICD-10-CM | POA: Diagnosis not present

## 2022-05-19 DIAGNOSIS — E119 Type 2 diabetes mellitus without complications: Secondary | ICD-10-CM | POA: Diagnosis not present

## 2022-05-19 DIAGNOSIS — E559 Vitamin D deficiency, unspecified: Secondary | ICD-10-CM | POA: Diagnosis not present

## 2022-05-19 DIAGNOSIS — M109 Gout, unspecified: Secondary | ICD-10-CM | POA: Diagnosis not present

## 2022-05-19 DIAGNOSIS — I252 Old myocardial infarction: Secondary | ICD-10-CM | POA: Diagnosis not present

## 2022-06-04 DIAGNOSIS — Z0279 Encounter for issue of other medical certificate: Secondary | ICD-10-CM

## 2022-06-06 ENCOUNTER — Other Ambulatory Visit: Payer: Self-pay

## 2022-06-06 ENCOUNTER — Telehealth: Payer: Self-pay | Admitting: Internal Medicine

## 2022-06-06 DIAGNOSIS — M10071 Idiopathic gout, right ankle and foot: Secondary | ICD-10-CM

## 2022-06-06 MED ORDER — COLCHICINE 0.6 MG PO TABS: 0.6000 mg | ORAL_TABLET | Freq: Every day | ORAL | 0 refills | Status: DC

## 2022-06-06 NOTE — Telephone Encounter (Signed)
Refills sent

## 2022-06-06 NOTE — Telephone Encounter (Signed)
AZ&ME application for free medicine [employer]  Copied Noted sleeved

## 2022-06-06 NOTE — Telephone Encounter (Signed)
Patient came by the office and need med refills has not taken this before.  colchicine 0.6 MG tablet [[id number]]   Pharmacy  [facility] [phone] - [place], [place] - [address] AT [facility] [address], [place] [address] Phone: [phone]  Fax: [phone]

## 2022-06-30 ENCOUNTER — Ambulatory Visit (HOSPITAL_COMMUNITY)
Admission: RE | Admit: 2022-06-30 | Discharge: 2022-06-30 | Disposition: A | Discharge status: Home / Self Care | Payer: Medicare HMO | Source: Ambulatory Visit | Attending: Internal Medicine | Admitting: Internal Medicine

## 2022-06-30 ENCOUNTER — Other Ambulatory Visit: Payer: Self-pay | Admitting: Internal Medicine

## 2022-06-30 DIAGNOSIS — I739 Peripheral vascular disease, unspecified: Secondary | ICD-10-CM

## 2022-06-30 NOTE — Progress Notes (Signed)
Provider spoke to vascular

## 2022-07-07 ENCOUNTER — Telehealth: Payer: Self-pay | Admitting: Internal Medicine

## 2022-07-07 ENCOUNTER — Other Ambulatory Visit: Payer: Self-pay

## 2022-07-07 DIAGNOSIS — Z8673 Personal history of transient ischemic attack (TIA), and cerebral infarction without residual deficits: Secondary | ICD-10-CM

## 2022-07-07 MED ORDER — CLOPIDOGREL BISULFATE 75 MG PO TABS: ORAL_TABLET | ORAL | 1 refills | Status: DC

## 2022-07-07 NOTE — Telephone Encounter (Signed)
Refill sent. Spoke with patient about ultrasound result.

## 2022-07-07 NOTE — Telephone Encounter (Signed)
Prescription Request  07/07/2022  LOV: 05/15/2022  What is the name of the medication or equipment? clopidogrel (PLAVIX) 75 MG tablet [   Have you contacted your pharmacy to request a refill? No   Which pharmacy would you like this sent to?  [facility] [phone] - [place], [place] - [address] AT [facility] OF [address] [place] [address] Phone: [phone] Fax: [phone]   Patient notified that their request is being sent to the clinical staff for review and that they should receive a response within 2 business days.   Please advise at [facility] [phone]    Patient also wants a call back in regard to Ultrasound results

## 2022-07-23 ENCOUNTER — Ambulatory Visit: Payer: Medicare HMO | Admitting: Podiatry

## 2022-07-23 ENCOUNTER — Encounter: Payer: Self-pay | Admitting: Podiatry

## 2022-07-23 DIAGNOSIS — E119 Type 2 diabetes mellitus without complications: Secondary | ICD-10-CM | POA: Diagnosis not present

## 2022-07-23 DIAGNOSIS — M79675 Pain in left toe(s): Secondary | ICD-10-CM | POA: Diagnosis not present

## 2022-07-23 DIAGNOSIS — M79674 Pain in right toe(s): Secondary | ICD-10-CM | POA: Diagnosis not present

## 2022-07-23 DIAGNOSIS — B351 Tinea unguium: Secondary | ICD-10-CM | POA: Diagnosis not present

## 2022-07-23 NOTE — Progress Notes (Signed)
This patient returns to my office for at risk foot care.  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  Patient is taking plavix.  This patient is unable to cut nails himself since the patient cannot reach his nails.These nails are painful walking and wearing shoes.  Patient says his vascular status is being evaluated.This patient presents for at risk foot care today.  General Appearance  Alert, conversant and in no acute stress.  Vascular  Dorsalis pedis and posterior tibial  pulses are weakly  palpable  bilaterally.  Capillary return is within normal limits  bilaterally. Temperature is within normal limits  bilaterally.  Neurologic  Senn-Weinstein monofilament wire test within normal limits/diminished   bilaterally. Muscle power within normal limits bilaterally.  Nails Thick disfigured discolored nails with subungual debris  from hallux to fifth toes bilaterally. No evidence of bacterial infection or drainage bilaterally.  Orthopedic  No limitations of motion  feet .  No crepitus or effusions noted.  No bony pathology or digital deformities noted. HAV  B/L.  Skin  normotropic skin with no porokeratosis noted bilaterally.  No signs of infections or ulcers noted.     Onychomycosis  Pain in right toes  Pain in left toes  Consent was obtained for treatment procedures.   Mechanical debridement of nails 1-5  bilaterally performed with a nail nipper.  Filed with dremel without incident.    Return office visit   3 months                  Told patient to return for periodic foot care and evaluation due to potential at risk complications.   Helane Gunther DPM

## 2022-08-06 ENCOUNTER — Ambulatory Visit: Payer: Medicare HMO

## 2022-08-06 ENCOUNTER — Other Ambulatory Visit: Payer: Self-pay

## 2022-08-06 ENCOUNTER — Ambulatory Visit: Payer: Medicare HMO | Admitting: Vascular Surgery

## 2022-08-06 ENCOUNTER — Encounter: Payer: Self-pay | Admitting: Vascular Surgery

## 2022-08-06 VITALS — BP 180/89 | HR 62 | Temp 96.8°F | Ht 75.0 in | Wt 197.8 lb

## 2022-08-06 DIAGNOSIS — I739 Peripheral vascular disease, unspecified: Secondary | ICD-10-CM

## 2022-08-06 NOTE — Progress Notes (Signed)
[facility]  Patient name: Albert Morris MRN: [id number] DOB: 06/06/1950 Sex: male  REASON FOR VISIT: Discuss noninvasive studies revealing arterial insufficiency  HPI: Albert Morris is a 72 y.o. male here today for discussion of recent lower extremity noninvasive studies.  He is known to our practice from prior left carotid endarterectomy with Dr. Edilia Bo in 2018.  He does have some occasional numbness on the dorsum of his right foot.  He recently underwent noninvasive studies at [facility] and is here today for discussion of this.  He does remain active.  He specifically denies any claudication type symptoms.  He is very compliant and seeing podiatry every 3 months for toenail trimming.  He denies any lower extremity tissue loss.  Past Medical History:  Diagnosis Date   Cerebellar stroke (HCC) 06/24/2016   Admitted with dizziness and "falling to the left" 06/24/16. Work up revealed multifocal acute ischemia within the right cerebellar hemisphere with occlusion versus severe stenosis of the right PICA and extensive, multifocal intracranial atherosclerosis with moderate stenoses of multiple distal MCA branches.   Colon polyps    CVA (cerebral vascular accident) (HCC) 06/21/2016   Diabetes mellitus without complication (HCC) 11/11/2018   Hyperlipidemia    Hypertension    Myocardial infarction (HCC)    Pneumonia    Substance abuse (HCC)    hx of EtOH and MJ    Family History  Problem Relation Age of Onset   Cancer Mother    Breast cancer Mother        lived to be 61   CVA Mother    CAD Mother    Arthritis Mother    Hypertension Mother    Other Father        gunshot wound   Rhiannan [place]iev[place] death Father        GSW   Cancer Brother        Stomach Cancer   Cancer Brother        Lung Cancer   Stroke Maternal Aunt    Alcohol abuse Maternal Uncle    Stroke Maternal Grandfather    Colon cancer Neg Hx      SOCIAL HISTORY: Social History   Tobacco Use   Smoking status: Former    Packs/day: 0.50    Years: 29.00    Additional pack years: 0.00    Total pack years: 14.50    Types: Cigarettes    Quit date: 2006    Years since quitting: 18.4   Smokeless tobacco: Never  Substance Use Topics   Alcohol use: Not Currently    Comment: quit 2006    Allergies  Allergen Reactions   Blood-Group Specific Substance     No whole blood products   Diclofenac Sodium-Menthol Gel [Diclofenac Sodium-Menthol Crm]     Shortness of breath   Prednisone     Elevates blood pressure    Current Outpatient Medications  Medication Sig Dispense Refill   aspirin EC 81 MG tablet Take 1 tablet (81 mg total) by mouth daily. 90 tablet 3   atorvastatin (LIPITOR) 80 MG tablet Take 1 tablet (80 mg total) by mouth daily. 90 tablet 3   carvedilol (COREG) 3.125 MG tablet Take 1 tablet (3.125 mg total) by mouth 2 (two) times daily with a meal. 180 tablet 3   Cholecalciferol (VITAMIN D) 50 MCG (2000 UT) CAPS Take 1 capsule by mouth daily.     clopidogrel (PLAVIX) 75 MG tablet TAKE 1 TABLET(75  MG) BY MOUTH DAILY 90 tablet 1   colchicine 0.6 MG tablet Take 1 tablet (0.6 mg total) by mouth daily. 20 tablet 0   lisinopril (ZESTRIL) 40 MG tablet Take 1 tablet (40 mg total) by mouth daily. 90 tablet 1   No current facility-administered medications for this visit.    REVIEW OF SYSTEMS:  [X]  denotes positive finding, [ ]  denotes negative finding Cardiac  Comments:  Chest pain or chest pressure:    Shortness of breath upon exertion:    Short of breath when lying flat:    Irregular heart rhythm:        Vascular    Pain in calf, thigh, or hip brought on by ambulation:    Pain in feet at night that wakes you up from your sleep:     Blood clot in your veins:    Leg swelling:           PHYSICAL EXAM: Vitals:   08/06/22 1132  BP: (!) 180/89  Pulse: 62  Temp: (!) 96.8 F (36 C)  SpO2: 98%  Weight: 197 lb 12.8 oz  (89.7 kg)  Height: 6\' 3"  (1.905 m)    GENERAL: The patient is a well-nourished male, in no acute distress. The vital signs are documented above. CARDIOVASCULAR: 2+ femoral pulses bilaterally.  I do not palpate a right popliteal pulse.  He has an easily palpable left popliteal pulse.  I do not palpate pedal pulses. PULMONARY: There is good air exchange  MUSCULOSKELETAL: There are no major deformities or cyanosis. NEUROLOGIC: No focal weakness or paresthesias are detected. SKIN: There are no ulcers or rashes noted. PSYCHIATRIC: The patient has a normal affect.  DATA:  Noninvasive studies from 06/30/2022 reviewed with the patient.  This revealed ankle arm index of 0.61 on the right and 0.97 on the left with triphasic flow.  MEDICAL ISSUES: Mild to moderate right lower extremity arterial insufficiency probably related to superficial femoral artery occlusive disease.  He is not having any symptoms to this and has not had any history of tissue loss.  I did explain the importance of footcare as he is currently doing.  He will notify us should he develop any tissue loss or claudication type symptoms.  Otherwise we will see him in February 2025 for his routine carotid follow-up.    Larina Earthly, MD FACS [facility] [phone]  Note: Portions of this report may have been transcribed using voice recognition software.  Every effort has been made to ensure accuracy; however, inadvertent computerized transcription errors may still be present.

## 2022-08-16 ENCOUNTER — Other Ambulatory Visit: Payer: Self-pay

## 2022-08-16 DIAGNOSIS — I6523 Occlusion and stenosis of bilateral carotid arteries: Secondary | ICD-10-CM

## 2022-09-04 DIAGNOSIS — E119 Type 2 diabetes mellitus without complications: Secondary | ICD-10-CM | POA: Diagnosis not present

## 2022-09-04 LAB — HM DIABETES EYE EXAM

## 2022-09-08 DIAGNOSIS — I10 Essential (primary) hypertension: Secondary | ICD-10-CM | POA: Diagnosis not present

## 2022-09-08 DIAGNOSIS — E1159 Type 2 diabetes mellitus with other circulatory complications: Secondary | ICD-10-CM | POA: Diagnosis not present

## 2022-09-08 DIAGNOSIS — I5032 Chronic diastolic (congestive) heart failure: Secondary | ICD-10-CM | POA: Diagnosis not present

## 2022-09-08 DIAGNOSIS — N1831 Chronic kidney disease, stage 3a: Secondary | ICD-10-CM | POA: Diagnosis not present

## 2022-09-09 LAB — CMP14+EGFR
ALT: 23 IU/L (ref 0–44)
AST: 24 IU/L (ref 0–40)
Albumin: 4.4 g/dL (ref 3.8–4.8)
Alkaline Phosphatase: 82 IU/L (ref 44–121)
BUN/Creatinine Ratio: 6 — ABNORMAL LOW (ref 10–24)
BUN: 8 mg/dL (ref 8–27)
Bilirubin Total: 0.4 mg/dL (ref 0.0–1.2)
CO2: 25 mmol/L (ref 20–29)
Calcium: 9.6 mg/dL (ref 8.6–10.2)
Chloride: 100 mmol/L (ref 96–106)
Creatinine, Ser: 1.34 mg/dL — ABNORMAL HIGH (ref 0.76–1.27)
Globulin, Total: 2.5 g/dL (ref 1.5–4.5)
Glucose: 99 mg/dL (ref 70–99)
Potassium: 4.4 mmol/L (ref 3.5–5.2)
Sodium: 138 mmol/L (ref 134–144)
Total Protein: 6.9 g/dL (ref 6.0–8.5)
eGFR: 56 mL/min/{1.73_m2} — ABNORMAL LOW (ref >59)

## 2022-09-09 LAB — CBC WITH DIFFERENTIAL/PLATELET
Basophils Absolute: 0.1 10*3/uL (ref 0.0–0.2)
Basos: 1 %
EOS (ABSOLUTE): 0.4 10*3/uL (ref 0.0–0.4)
Eos: 7 %
Hematocrit: 36.6 % — ABNORMAL LOW (ref 37.5–51.0)
Hemoglobin: 12.4 g/dL — ABNORMAL LOW (ref 13.0–17.7)
Immature Grans (Abs): 0 10*3/uL (ref 0.0–0.1)
Immature Granulocytes: 0 %
Lymphocytes Absolute: 1.5 10*3/uL (ref 0.7–3.1)
Lymphs: 26 %
MCH: 29.1 pg (ref 26.6–33.0)
MCHC: 33.9 g/dL (ref 31.5–35.7)
MCV: 86 fL (ref 79–97)
Monocytes Absolute: 0.6 10*3/uL (ref 0.1–0.9)
Monocytes: 10 %
Neutrophils Absolute: 3.1 10*3/uL (ref 1.4–7.0)
Neutrophils: 56 %
Platelets: 286 10*3/uL (ref 150–450)
RBC: 4.26 x10E6/uL (ref 4.14–5.80)
RDW: 12.9 % (ref 11.6–15.4)
WBC: 5.5 10*3/uL (ref 3.4–10.8)

## 2022-09-09 LAB — HEMOGLOBIN A1C
Est. average glucose Bld gHb Est-mCnc: 143 mg/dL
Hgb A1c MFr Bld: 6.6 % — ABNORMAL HIGH (ref 4.8–5.6)

## 2022-09-09 LAB — URIC ACID: Uric Acid: 8 mg/dL (ref 3.8–8.4)

## 2022-09-16 ENCOUNTER — Ambulatory Visit (INDEPENDENT_AMBULATORY_CARE_PROVIDER_SITE_OTHER): Payer: Medicare HMO | Admitting: Internal Medicine

## 2022-09-16 ENCOUNTER — Encounter: Payer: Self-pay | Admitting: Internal Medicine

## 2022-09-16 VITALS — BP 144/70 | HR 74 | Ht 75.0 in | Wt 198.6 lb

## 2022-09-16 DIAGNOSIS — E1159 Type 2 diabetes mellitus with other circulatory complications: Secondary | ICD-10-CM

## 2022-09-16 DIAGNOSIS — I700 Atherosclerosis of aorta: Secondary | ICD-10-CM | POA: Diagnosis not present

## 2022-09-16 DIAGNOSIS — Z0001 Encounter for general adult medical examination with abnormal findings: Secondary | ICD-10-CM

## 2022-09-16 DIAGNOSIS — I739 Peripheral vascular disease, unspecified: Secondary | ICD-10-CM

## 2022-09-16 DIAGNOSIS — R053 Chronic cough: Secondary | ICD-10-CM | POA: Diagnosis not present

## 2022-09-16 DIAGNOSIS — I6523 Occlusion and stenosis of bilateral carotid arteries: Secondary | ICD-10-CM | POA: Diagnosis not present

## 2022-09-16 DIAGNOSIS — I5032 Chronic diastolic (congestive) heart failure: Secondary | ICD-10-CM | POA: Diagnosis not present

## 2022-09-16 DIAGNOSIS — N1831 Chronic kidney disease, stage 3a: Secondary | ICD-10-CM

## 2022-09-16 DIAGNOSIS — Z125 Encounter for screening for malignant neoplasm of prostate: Secondary | ICD-10-CM | POA: Diagnosis not present

## 2022-09-16 DIAGNOSIS — I10 Essential (primary) hypertension: Secondary | ICD-10-CM

## 2022-09-16 DIAGNOSIS — Z8673 Personal history of transient ischemic attack (TIA), and cerebral infarction without residual deficits: Secondary | ICD-10-CM

## 2022-09-16 DIAGNOSIS — E782 Mixed hyperlipidemia: Secondary | ICD-10-CM

## 2022-09-16 MED ORDER — CARVEDILOL 6.25 MG PO TABS: 6.2500 mg | ORAL_TABLET | Freq: Two times a day (BID) | ORAL | 1 refills | Status: DC

## 2022-09-16 MED ORDER — ATORVASTATIN CALCIUM 80 MG PO TABS: 80.0000 mg | ORAL_TABLET | Freq: Every day | ORAL | 3 refills | Status: DC

## 2022-09-16 NOTE — Assessment & Plan Note (Signed)
On aspirin and statin Needs to improve diet for better HTN control

## 2022-09-16 NOTE — Assessment & Plan Note (Signed)
On aspirin, Plavix and statin No residual neurologic deficits currently 

## 2022-09-16 NOTE — Assessment & Plan Note (Signed)
Has blackish discoloration of the toes chronically, but warm extremities Has history of CAD, CVA and carotid artery stenosis [place] ABI screening test showed ABI index of 0.61 on right leg, likely due to right superficial femoral artery occlusion, had vascular surgery evaluation-recommended conservative treatment Currently on DAPT and statin

## 2022-09-16 NOTE — Assessment & Plan Note (Addendum)
Last Echo reviewed On ACEi and B-blocker Had added Farxiga, he did not take it as he was concerned about its side effects, after counseling he still denied to start taking it

## 2022-09-16 NOTE — Assessment & Plan Note (Signed)
Lipid profile reviewed Continue Lipitor 80 mg every other day (has leg cramps with daily dosing) - needs to take it regularly Did not tolerate Zetia If persistent elevated LDL, will need Repatha or Praluent - cost can be a concern

## 2022-09-16 NOTE — Assessment & Plan Note (Addendum)
Previous history of smoking, 10-pack-year smoking Has cough with clear expectoration, less likely to be related to lisinopril Could be chronic bronchitis, but denies any dyspnea or wheezing Advised to use Mucinex as needed for cough

## 2022-09-16 NOTE — Assessment & Plan Note (Signed)
Physical exam as documented. Counseling done  re healthy lifestyle involving commitment to 150 minutes exercise per week, heart healthy diet, and attaining healthy weight.The importance of adequate sleep also discussed. Changes in health habits are decided on by the patient with goals and time frames  set for achieving them. Immunization and cancer screening needs are specifically addressed at this visit.  Denies pneumococcal vaccine today. Advised to get Shingrix and Tdap vaccines at local pharmacy.

## 2022-09-16 NOTE — Assessment & Plan Note (Signed)
Ordered PSA after discussing its limitations for prostate cancer screening, including false positive results leading to additional investigations. 

## 2022-09-16 NOTE — Assessment & Plan Note (Addendum)
BP Readings from Last 1 Encounters:  09/16/22 (!) 174/75   Uncontrolled now On lisinopril and Coreg Due to recurrent episodes of gout, discontinued HCTZ Increased dose of Coreg to 6.25 mg BID Counseled for compliance with the medications Advised DASH diet and moderate exercise/walking as tolerated

## 2022-09-16 NOTE — Patient Instructions (Addendum)
Please start taking Carvedilol 6.25 mg twice daily instead of 3.125 mg.  Please continue to take medications as prescribed.  Please continue to follow low carb diet and perform moderate exercise/walking at least 150 mins/week.  Please get fasting blood tests done before the next visit.  Please consider getting Shingrix and Tdap vaccine at local pharmacy.

## 2022-09-16 NOTE — Assessment & Plan Note (Signed)
Followed by Vascular surgery, has had left carotid endarterectomy On DAPT and statin 

## 2022-09-16 NOTE — Assessment & Plan Note (Addendum)
Likely from HTN, DM and age related decline On lisinopril for HTN Had added Farxiga for CKD and HFpEF, but did not take it due to concern for side effects Avoid nephrotoxic agents Advised to improve fluid intake to at least 64 ounces of fluid in a day

## 2022-09-16 NOTE — Progress Notes (Signed)
Established Patient Office Visit  Subjective:  Patient ID: Albert Morris, male    DOB: 10/28/1950  Age: 72 y.o. MRN: [id number]  CC:  Chief Complaint  Patient presents with   Annual Exam    HPI Albert Morris is a 72 y.o. male with past medical history of HTN, CAD, type II DM with HLD, CVA and tobacco abuse who presents for annual physical.  HTN: BP is elevated today. Takes lisinopril 40 mg QD and Coreg 3.125 mg twice daily regularly.  His HCTZ was discontinued recently due to recurrent gout flareup.  Patient denies headache, dizziness, chest pain, dyspnea or palpitations.   HLD: He has been taking Lipitor now.   Type 2 DM: His HbA1C is stable at 6.6. Diet controlled.  He was given [place] considering history of HFpEF, CAD and PAD, but he did not take it considering its side effects.  He was counseled about its potential benefits and his low risk of side effects in the last visit, but does not prefer to take medicine for it. Denies any polyuria or polyphagia.  Chronic cough: He has chronic cough with clear expectoration.  Denies any dyspnea or wheezing.  He reports smoking about 0.3 pack/day for 30 years, quit in 2006.   Past Medical History:  Diagnosis Date   Cerebellar stroke (HCC) 06/24/2016   Admitted with dizziness and "falling to the left" 06/24/16. Work up revealed multifocal acute ischemia within the right cerebellar hemisphere with occlusion versus severe stenosis of the right PICA and extensive, multifocal intracranial atherosclerosis with moderate stenoses of multiple distal MCA branches.   Colon polyps    CVA (cerebral vascular accident) (HCC) 06/21/2016   Diabetes mellitus without complication (HCC) 11/11/2018   Hyperlipidemia    Hypertension    Myocardial infarction (HCC)    Pneumonia    Substance abuse (HCC)    hx of EtOH and MJ    Past Surgical History:  Procedure Laterality Date   CAROTID ENDARTERECTOMY Left    COLON SURGERY  [phone]   growth  removed, Laparoscopic Vs. open Left hemicolectomy Dr. Liliane Shi 09-25-17   COLONOSCOPY N/A 06/26/2017   Procedure: COLONOSCOPY;  Surgeon: Malissa Hippo, MD;  Location: [facility];  Service: Endoscopy;  Laterality: N/A;  730   COLONOSCOPY N/A 12/01/2018   Procedure: COLONOSCOPY;  Surgeon: Malissa Hippo, MD;  Location: [facility];  Service: Endoscopy;  Laterality: N/A;  930   COLONOSCOPY WITH PROPOFOL N/A 10/07/2017   Procedure: COLONOSCOPY WITH PROPOFOL WITH TATTOO;  Surgeon: Andria Meuse, MD;  Location: [facility];  Service: General;  Laterality: N/A;   ENDARTERECTOMY Left 08/01/2016   Procedure: ENDARTERECTOMY LEFT CAROTID;  Surgeon: Chuck Hint, MD;  Location: [facility] OR;  Service: Vascular;  Laterality: Left;   FLEXIBLE SIGMOIDOSCOPY N/A 10/08/2017   Procedure: FLEXIBLE SIGMOIDOSCOPY;  Surgeon: Andria Meuse, MD;  Location: [facility] ORS;  Service: General;  Laterality: N/A;   LAPAROSCOPIC RIGHT HEMI COLECTOMY Left 10/08/2017   Procedure: LAPAROSCOPIC VERSES OPEN LEFT HEMI COLECTOMY ERAS PATHWAY;  Surgeon: Andria Meuse, MD;  Location: [facility];  Service: General;  Laterality: Left;   PATCH ANGIOPLASTY Left 08/01/2016   Procedure: PATCH ANGIOPLASTY LEFT CAROTID ARTERY USING ZENOSURE BIOLOGIC PATCH;  Surgeon: Chuck Hint, MD;  Location: [facility] OR;  Service: Vascular;  Laterality: Left;   POLYPECTOMY  12/01/2018   Procedure: POLYPECTOMY;  Surgeon: Malissa Hippo, MD;  Location: [facility];  Service: Endoscopy;;   SUBMUCOSAL INJECTION  10/07/2017  Procedure: SUBMUCOSAL INJECTION;  Surgeon: Andria Meuse, MD;  Location: [facility];  Service: General;;   TONSILLECTOMY      Family History  Problem Relation Age of Onset   Cancer Mother    Breast cancer Mother        lived to be 22   CVA Mother    CAD Mother    Arthritis Mother    Hypertension Mother    Other Father        gunshot wound   Early death Father        GSW   Cancer Brother         Stomach Cancer   Cancer Brother        Lung Cancer   Stroke Maternal Aunt    Alcohol abuse Maternal Uncle    Stroke Maternal Grandfather    Colon cancer Neg Hx     Social History   Socioeconomic History   Marital status: Divorced    Spouse name: Not on file   Number of children: 2   Years of education: 12   Highest education level: Not on file  Occupational History   Occupation: disabled Cytogeneticist x8 years; 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[place], [place], [place], [place], [place], [place], [place], [place]    Comment: alcoholic  Tobacco Use   Smoking status: Former    Current packs/day: 0.00    Average packs/day: 0.5 packs/day for 29.0 years (14.5 ttl pk-yrs)    Types: Cigarettes    Start date: 77    Quit date: 2006    Years since quitting: 18.5   Smokeless tobacco: Never  Vaping Use   Vaping status: Never Used  Substance and Sexual Activity   Alcohol use: Not Currently    Comment: quit 2006   Drug use: Not Currently    Comment: remote h/o heavy marijuana use, also used cocaine and others   Sexual activity: Not Currently  Other Topics Concern   Not on file  Social History Narrative   Divorced   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Electronics engineer for 8 years, EMT certified      Lives alone   Likes to walk      2 sons -in [place] and [place]   Social Determinants of Health   Financial Resource Strain: Low Risk  (03/10/2022)   Overall Financial Resource Strain (CARDIA)    Difficulty of Paying Living Expenses: Not hard at all  Food Insecurity: No Food Insecurity (03/10/2022)   Hunger Vital Sign    Worried About Running Out of Food in the Last Year: Never true    Ran Out of Food in the Last Year: Never true  Transportation Needs: No Transportation Needs (03/10/2022)   PRAPARE - Administrator, Civil Service (Medical): No    Lack of Transportation (Non-Medical): No  Physical Activity: Sufficiently Active (03/10/2022)   Exercise Vital Sign    Days of Exercise per Week: 7 days    Minutes of Exercise per Session: 30 min   Stress: No Stress Concern Present (03/10/2022)   [employer] of Occupational Health - Occupational Stress Questionnaire    Feeling of Stress : Not at all  Social Connections: Moderately Isolated (03/10/2022)   Social Connection and Isolation Panel [NHANES]    Frequency of Communication with Friends and Family: More than three times a week    Frequency of Social Gatherings with Friends and Family: More than three times a week    Attends Religious Services: More than 4 times per year  Active Member of Clubs or Organizations: No    Attends Banker Meetings: Never    Marital Status: Divorced  Catering manager Violence: Not At Risk (03/10/2022)   Humiliation, Afraid, Rape, and Kick questionnaire    Fear of Current or Ex-Partner: No    Emotionally Abused: No    Physically Abused: No    Sexually Abused: No    Outpatient Medications Prior to Visit  Medication Sig Dispense Refill   aspirin EC 81 MG tablet Take 1 tablet (81 mg total) by mouth daily. 90 tablet 3   Cholecalciferol (VITAMIN D) 50 MCG (2000 UT) CAPS Take 1 capsule by mouth daily.     clopidogrel (PLAVIX) 75 MG tablet TAKE 1 TABLET(75 MG) BY MOUTH DAILY 90 tablet 1   colchicine 0.6 MG tablet Take 1 tablet (0.6 mg total) by mouth daily. 20 tablet 0   lisinopril (ZESTRIL) 40 MG tablet Take 1 tablet (40 mg total) by mouth daily. 90 tablet 1   atorvastatin (LIPITOR) 80 MG tablet Take 1 tablet (80 mg total) by mouth daily. 90 tablet 3   carvedilol (COREG) 3.125 MG tablet Take 1 tablet (3.125 mg total) by mouth 2 (two) times daily with a meal. 180 tablet 3   No facility-administered medications prior to visit.    Allergies  Allergen Reactions   Blood-Group Specific Substance     No whole blood products   Diclofenac Sodium-Menthol Gel [Diclofenac Sodium-Menthol Crm]     Shortness of breath   Prednisone     Elevates blood pressure    ROS Review of Systems  Constitutional:  Negative for chills and fever.   HENT:  Negative for congestion and sore throat.   Eyes:  Negative for pain and discharge.  Respiratory:  Positive for cough. Negative for shortness of breath.   Cardiovascular:  Negative for chest pain and palpitations.  Gastrointestinal:  Negative for constipation, diarrhea, nausea and vomiting.  Endocrine: Negative for polydipsia and polyuria.  Genitourinary:  Negative for dysuria and hematuria.  Musculoskeletal:  Negative for neck pain and neck stiffness.  Skin:  Negative for rash.  Neurological:  Negative for dizziness, weakness, numbness and headaches.  Psychiatric/Behavioral:  Negative for agitation and behavioral problems.       Objective:    Physical Exam Vitals reviewed.  Constitutional:      General: He is not in acute distress.    Appearance: He is not diaphoretic.  HENT:     Head: Normocephalic and atraumatic.     Nose: Nose normal.     Mouth/Throat:     Mouth: Mucous membranes are moist.  Eyes:     General: No scleral icterus.    Extraocular Movements: Extraocular movements intact.  Cardiovascular:     Rate and Rhythm: Normal rate and regular rhythm.     Pulses: Normal pulses.     Heart sounds: Normal heart sounds. No murmur heard. Pulmonary:     Breath sounds: Normal breath sounds. No wheezing or rales.  Abdominal:     Palpations: Abdomen is soft.     Tenderness: There is no abdominal tenderness.  Musculoskeletal:     Cervical back: Neck supple. No tenderness.     Right lower leg: No edema.     Left lower leg: No edema.  Skin:    General: Skin is warm.     Findings: No rash.  Neurological:     General: No focal deficit present.     Mental Status: He is alert and  oriented to person, place, and time.     Cranial Nerves: No cranial nerve deficit.     Sensory: No sensory deficit.     Motor: No weakness.  Psychiatric:        Mood and Affect: Mood normal.        Behavior: Behavior normal.     BP (!) 144/70 (BP Location: Left Arm)   Pulse 74   Ht 6'  3" (1.905 m)   Wt 198 lb 9.6 oz (90.1 kg)   SpO2 95%   BMI 24.82 kg/m  Wt Readings from Last 3 Encounters:  09/16/22 198 lb 9.6 oz (90.1 kg)  08/06/22 197 lb 12.8 oz (89.7 kg)  05/15/22 200 lb 12.8 oz (91.1 kg)    Lab Results  Component Value Date   TSH 1.490 09/04/2021   Lab Results  Component Value Date   WBC 5.5 09/08/2022   HGB 12.4 (L) 09/08/2022   HCT 36.6 (L) 09/08/2022   MCV 86 09/08/2022   PLT 286 09/08/2022   Lab Results  Component Value Date   NA 138 09/08/2022   K 4.4 09/08/2022   CO2 25 09/08/2022   GLUCOSE 99 09/08/2022   BUN 8 09/08/2022   CREATININE 1.34 (H) 09/08/2022   BILITOT 0.4 09/08/2022   ALKPHOS 82 09/08/2022   AST 24 09/08/2022   ALT 23 09/08/2022   PROT 6.9 09/08/2022   ALBUMIN 4.4 09/08/2022   CALCIUM 9.6 09/08/2022   ANIONGAP 11 10/12/2017   EGFR 56 (L) 09/08/2022   Lab Results  Component Value Date   CHOL 176 05/08/2022   Lab Results  Component Value Date   HDL 46 05/08/2022   Lab Results  Component Value Date   LDLCALC 116 (H) 05/08/2022   Lab Results  Component Value Date   TRIG 75 05/08/2022   Lab Results  Component Value Date   CHOLHDL 3.8 05/08/2022   Lab Results  Component Value Date   HGBA1C 6.6 (H) 09/08/2022      Assessment & Plan:   Problem List Items Addressed This Visit       Cardiovascular and Mediastinum   Stenosis of carotid artery    Followed by Vascular surgery, has had left carotid endarterectomy On DAPT and statin      Relevant Medications   carvedilol (COREG) 6.25 MG tablet   atorvastatin (LIPITOR) 80 MG tablet   Essential hypertension    BP Readings from Last 1 Encounters:  09/16/22 (!) 174/75   Uncontrolled now On lisinopril and Coreg Due to recurrent episodes of gout, discontinued HCTZ Increased dose of Coreg to 6.25 mg BID Counseled for compliance with the medications Advised DASH diet and moderate exercise/walking as tolerated      Relevant Medications   carvedilol  (COREG) 6.25 MG tablet   atorvastatin (LIPITOR) 80 MG tablet   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)   Relevant Medications   carvedilol (COREG) 6.25 MG tablet   atorvastatin (LIPITOR) 80 MG tablet   Other Relevant Orders   CMP14+EGFR   Hemoglobin A1c   Atherosclerosis of aorta (HCC)    On aspirin and statin Needs to improve diet for better HTN control      Relevant Medications   carvedilol (COREG) 6.25 MG tablet   atorvastatin (LIPITOR) 80 MG tablet   Chronic heart failure with preserved ejection fraction (HCC)    Last Echo reviewed On ACEi and B-blocker Had added Farxiga, he did not take it as he was  concerned about its side effects, after counseling he still denied to start taking it      Relevant Medications   carvedilol (COREG) 6.25 MG tablet   atorvastatin (LIPITOR) 80 MG tablet   PAD (peripheral artery disease) (HCC)    Has blackish discoloration of the toes chronically, but warm extremities Has history of CAD, CVA and carotid artery stenosis [place] ABI screening test showed ABI index of 0.61 on right leg, likely due to right superficial femoral artery occlusion, had vascular surgery evaluation-recommended conservative treatment Currently on DAPT and statin       Relevant Medications   carvedilol (COREG) 6.25 MG tablet   atorvastatin (LIPITOR) 80 MG tablet     Genitourinary   Stage 3a chronic kidney disease (HCC)    Likely from HTN, DM and age related decline On lisinopril for HTN Had added Farxiga for CKD and HFpEF, but did not take it due to concern for side effects Avoid nephrotoxic agents Advised to improve fluid intake to at least 64 ounces of fluid in a day      Relevant Orders   CMP14+EGFR     Other   History of CVA (cerebrovascular accident)    On aspirin, Plavix and statin No residual neurologic deficits currently      Relevant Medications   atorvastatin (LIPITOR) 80 MG tablet   Mixed hyperlipidemia    Lipid profile  reviewed Continue Lipitor 80 mg every other day (has leg cramps with daily dosing) - needs to take it regularly Did not tolerate Zetia If persistent elevated LDL, will need Repatha or Praluent - cost can be a concern      Relevant Medications   carvedilol (COREG) 6.25 MG tablet   atorvastatin (LIPITOR) 80 MG tablet   Other Relevant Orders   Lipid Profile   Prostate cancer screening    Ordered PSA after discussing its limitations for prostate cancer screening, including false positive results leading to additional investigations.      Relevant Orders   PSA   Encounter for general adult medical examination with abnormal findings - Primary    Physical exam as documented. Counseling done  re healthy lifestyle involving commitment to 150 minutes exercise per week, heart healthy diet, and attaining healthy weight.The importance of adequate sleep also discussed. Changes in health habits are decided on by the patient with goals and time frames  set for achieving them. Immunization and cancer screening needs are specifically addressed at this visit.  Denies pneumococcal vaccine today. Advised to get Shingrix and Tdap vaccines at local pharmacy.      Chronic cough    Previous history of smoking, 10-pack-year smoking Has cough with clear expectoration, less likely to be related to lisinopril Could be chronic bronchitis, but denies any dyspnea or wheezing Advised to use Mucinex as needed for cough       Meds ordered this encounter  Medications   carvedilol (COREG) 6.25 MG tablet    Sig: Take 1 tablet (6.25 mg total) by mouth 2 (two) times daily with a meal.    Dispense:  180 tablet    Refill:  1    Dose change - 09/16/22   atorvastatin (LIPITOR) 80 MG tablet    Sig: Take 1 tablet (80 mg total) by mouth daily.    Dispense:  90 tablet    Refill:  3    Follow-up: Return in about 4 months (around 01/17/2023) for DM and HTN.    Anabel Halon, MD

## 2022-09-23 ENCOUNTER — Telehealth: Payer: Self-pay | Admitting: Internal Medicine

## 2022-09-23 ENCOUNTER — Other Ambulatory Visit: Payer: Self-pay

## 2022-09-23 DIAGNOSIS — I10 Essential (primary) hypertension: Secondary | ICD-10-CM

## 2022-09-23 MED ORDER — LISINOPRIL 40 MG PO TABS
40.0000 mg | ORAL_TABLET | Freq: Every day | ORAL | 1 refills | Status: DC
Start: 2022-09-23 — End: 2023-05-25

## 2022-09-23 NOTE — Telephone Encounter (Signed)
Refills sent to pharmacy. 

## 2022-09-23 NOTE — Telephone Encounter (Signed)
Per patient needs to be re written to the pharmacy. Need new rx for 90 days medication was double increased to 40 mg. Patient completely out because not pay til 10/27/2022. Pharmacy asked patient to asked provider to rewrite rx so insurance will pay for the medicine.  Prescription Request  09/23/2022  LOV: 09/16/2022  What is the name of the medication or equipment? lisinopril (ZESTRIL) 40 MG tablet   Have you contacted your pharmacy to request a refill? Yes   Which pharmacy would you like this sent to?  [facility] [address] [facility]   Patient notified that their request is being sent to the clinical staff for review and that they should receive a response within 2 business days.   Please advise at walked into office

## 2022-10-04 IMAGING — DX DG TOE GREAT 2+V*R*
3 series · 3 of 3 positions shown · non-contrast
Comparison: None.

CLINICAL DATA: 69-year-old male status post blunt trauma to the
right great toe 3 days ago. Diabetes and hypertension.

EXAM:
RIGHT GREAT TOE

[toe ap]
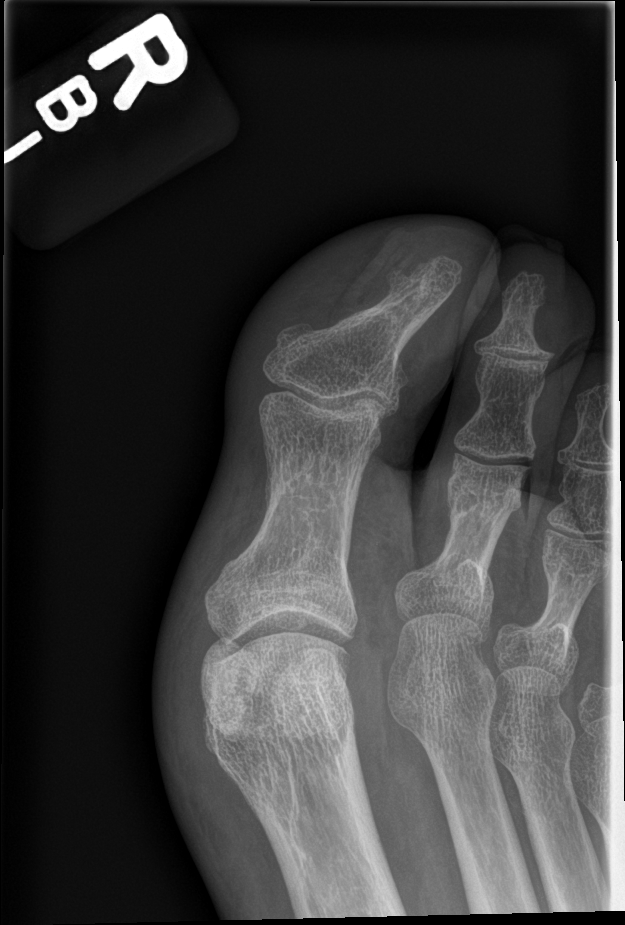

[toe obl]
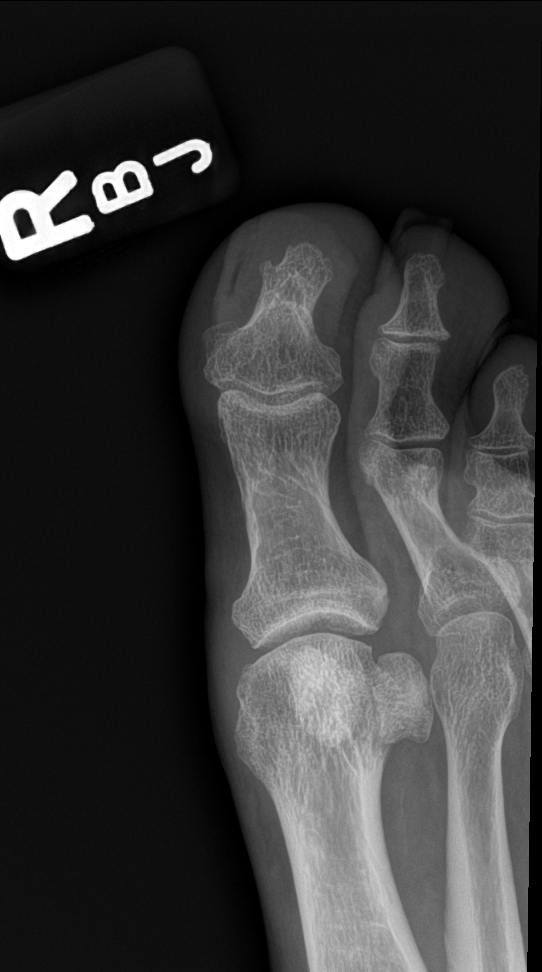

[toe lat]
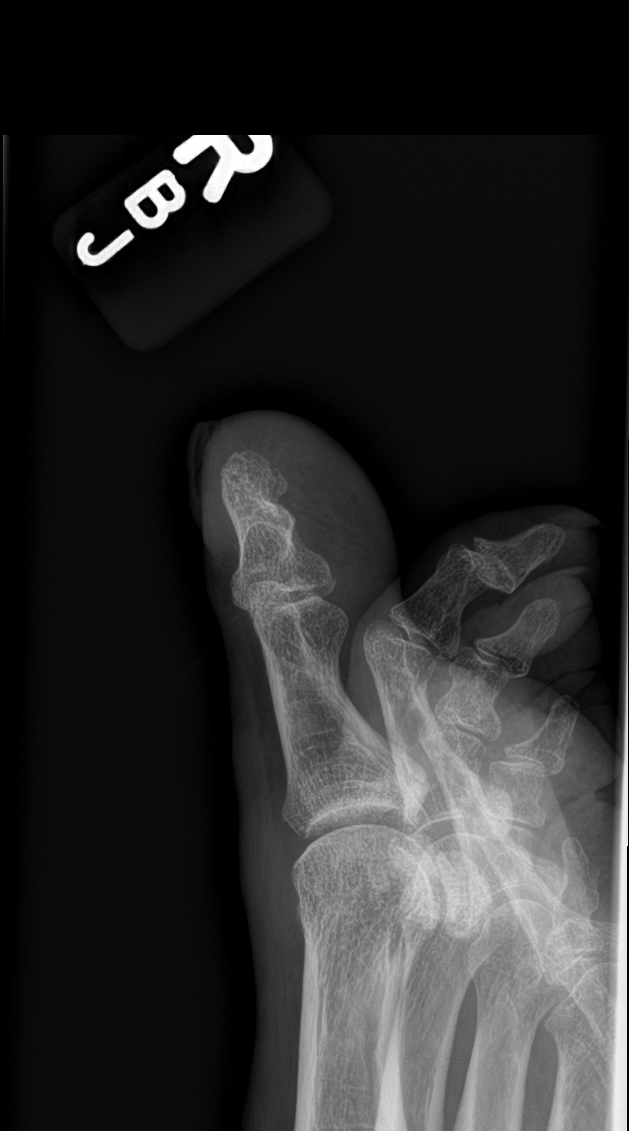

[3 of 3 positions shown; findings below may reference images not displayed]

FINDINGS: Soft tissue swelling at the distal great toe. Bone mineralization is
within normal limits for age. There is no evidence of fracture or
dislocation. Joint spaces and alignment within normal limits for
age.
IMPRESSION: Soft tissue swelling with no acute fracture or dislocation
identified about the right great toe.

## 2022-10-24 ENCOUNTER — Encounter: Payer: Self-pay | Admitting: Podiatry

## 2022-10-24 ENCOUNTER — Ambulatory Visit: Payer: Medicare HMO | Admitting: Podiatry

## 2022-10-24 DIAGNOSIS — M79675 Pain in left toe(s): Secondary | ICD-10-CM

## 2022-10-24 DIAGNOSIS — B351 Tinea unguium: Secondary | ICD-10-CM

## 2022-10-24 DIAGNOSIS — E119 Type 2 diabetes mellitus without complications: Secondary | ICD-10-CM | POA: Diagnosis not present

## 2022-10-24 DIAGNOSIS — M79674 Pain in right toe(s): Secondary | ICD-10-CM

## 2022-10-24 NOTE — Progress Notes (Signed)
This patient returns to my office for at risk foot care.  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  Patient is taking plavix.  This patient is unable to cut nails himself since the patient cannot reach his nails.These nails are painful walking and wearing shoes.  Patient says his vascular status is being evaluated.This patient presents for at risk foot care today.  General Appearance  Alert, conversant and in no acute stress.  Vascular  Dorsalis pedis and posterior tibial  pulses are weakly  palpable  bilaterally.  Capillary return is within normal limits  bilaterally. Temperature is within normal limits  bilaterally.  Neurologic  Senn-Weinstein monofilament wire test within normal limits/diminished   bilaterally. Muscle power within normal limits bilaterally.  Nails Thick disfigured discolored nails with subungual debris  from hallux to fifth toes bilaterally. No evidence of bacterial infection or drainage bilaterally.  Orthopedic  No limitations of motion  feet .  No crepitus or effusions noted.  No bony pathology or digital deformities noted. HAV  B/L.  Skin  normotropic skin with no porokeratosis noted bilaterally.  No signs of infections or ulcers noted.     Onychomycosis  Pain in right toes  Pain in left toes  Consent was obtained for treatment procedures.   Mechanical debridement of nails 1-5  bilaterally performed with a nail nipper.  Filed with dremel without incident.    Return office visit   3 months                  Told patient to return for periodic foot care and evaluation due to potential at risk complications.   Helane Gunther DPM

## 2022-11-06 ENCOUNTER — Other Ambulatory Visit: Payer: Self-pay | Admitting: Internal Medicine

## 2022-11-06 DIAGNOSIS — I10 Essential (primary) hypertension: Secondary | ICD-10-CM

## 2022-11-06 DIAGNOSIS — E782 Mixed hyperlipidemia: Secondary | ICD-10-CM

## 2022-11-06 DIAGNOSIS — Z8673 Personal history of transient ischemic attack (TIA), and cerebral infarction without residual deficits: Secondary | ICD-10-CM

## 2022-11-10 ENCOUNTER — Encounter: Payer: Self-pay | Admitting: Cardiology

## 2022-11-10 ENCOUNTER — Ambulatory Visit (HOSPITAL_COMMUNITY)
Admission: RE | Admit: 2022-11-10 | Discharge: 2022-11-10 | Disposition: A | Discharge status: Home / Self Care | Payer: Medicare HMO | Source: Ambulatory Visit | Attending: Internal Medicine | Admitting: Internal Medicine

## 2022-11-10 DIAGNOSIS — I700 Atherosclerosis of aorta: Secondary | ICD-10-CM | POA: Diagnosis not present

## 2022-11-10 DIAGNOSIS — I351 Nonrheumatic aortic (valve) insufficiency: Secondary | ICD-10-CM | POA: Insufficient documentation

## 2022-11-10 LAB — ECHOCARDIOGRAM COMPLETE
AR max vel: 1.99 cm2
AV Area VTI: 2.4 cm2
AV Area mean vel: 2.43 cm2
AV Mean grad: 6 mmHg
AV Peak grad: 12.8 mmHg
Ao pk vel: 1.79 m/s
Area-P 1/2: 2.99 cm2
S' Lateral: 3 cm

## 2022-11-10 NOTE — Progress Notes (Signed)
*  PRELIMINARY RESULTS* Echocardiogram 2D Echocardiogram has been performed.  Albert Morris 11/10/2022, 9:27 AM

## 2022-11-12 ENCOUNTER — Ambulatory Visit (INDEPENDENT_AMBULATORY_CARE_PROVIDER_SITE_OTHER): Payer: Medicare HMO | Admitting: Internal Medicine

## 2022-11-12 ENCOUNTER — Encounter: Payer: Self-pay | Admitting: Internal Medicine

## 2022-11-12 VITALS — BP 136/70 | HR 64 | Ht 75.0 in | Wt 196.8 lb

## 2022-11-12 DIAGNOSIS — M10061 Idiopathic gout, right knee: Secondary | ICD-10-CM

## 2022-11-12 DIAGNOSIS — L03115 Cellulitis of right lower limb: Secondary | ICD-10-CM | POA: Insufficient documentation

## 2022-11-12 MED ORDER — COLCHICINE 0.6 MG PO TABS
ORAL_TABLET | ORAL | 0 refills | Status: DC
Start: 2022-11-12 — End: 2023-03-27

## 2022-11-12 MED ORDER — CEPHALEXIN 500 MG PO CAPS
500.0000 mg | ORAL_CAPSULE | Freq: Three times a day (TID) | ORAL | 0 refills | Status: DC
Start: 2022-11-12 — End: 2023-01-19

## 2022-11-12 NOTE — Progress Notes (Signed)
Acute Office Visit  Subjective:    Patient ID: Albert Morris, male    DOB: [birth year], 72 y.o.   MRN: [id number]  Chief Complaint  Patient presents with   Knee Pain    Right knee pain for three days    HPI Patient is in today for c/o right knee area pain and swelling for the last 3 days. Pain is constant, dull and worse with movement. He has warmth and erythema of knee area as well. He does not recall insect bite. Denies any recent injury. He has history of gout. He reports that he was eating seafood - fish everyday for 1 week before his knee pain started.  Past Medical History:  Diagnosis Date   Aortic insufficiency    mild AI with AVSC on echo 10/2022   Cerebellar stroke (HCC) 06/24/2016   Admitted with dizziness and "falling to the left" 06/24/16. Work up revealed multifocal acute ischemia within the right cerebellar hemisphere with occlusion versus severe stenosis of the right PICA and extensive, multifocal intracranial atherosclerosis with moderate stenoses of multiple distal MCA branches.   Colon polyps    CVA (cerebral vascular accident) (HCC) 06/21/2016   Diabetes mellitus without complication (HCC) 11/11/2018   Hyperlipidemia    Hypertension    Myocardial infarction (HCC)    Pneumonia    Substance abuse (HCC)    hx of EtOH and MJ    Past Surgical History:  Procedure Laterality Date   CAROTID ENDARTERECTOMY Left    COLON SURGERY  [phone]   growth removed, Laparoscopic Vs. open Left hemicolectomy Dr. Liliane Shi 09-25-17   COLONOSCOPY N/A 06/26/2017   Procedure: COLONOSCOPY;  Surgeon: Malissa Hippo, MD;  [facility];  Service: Endoscopy;  Laterality: N/A;  730   COLONOSCOPY N/A 12/01/2018   Procedure: COLONOSCOPY;  Surgeon: Malissa Hippo, MD;  [facility];  Service: Endoscopy;  Laterality: N/A;  930   COLONOSCOPY WITH PROPOFOL N/A 10/07/2017   Procedure: COLONOSCOPY WITH PROPOFOL WITH TATTOO;  Surgeon: Andria Meuse, MD;  Location: [facility];  Service: General;  Laterality: N/A;   ENDARTERECTOMY Left 08/01/2016   Procedure: ENDARTERECTOMY LEFT CAROTID;  Surgeon: Chuck Hint, MD;  Location: [facility] OR;  Service: Vascular;  Laterality: Left;   FLEXIBLE SIGMOIDOSCOPY N/A 10/08/2017   Procedure: FLEXIBLE SIGMOIDOSCOPY;  Surgeon: Andria Meuse, MD;  Location: [facility];  Service: General;  Laterality: N/A;   LAPAROSCOPIC RIGHT HEMI COLECTOMY Left 10/08/2017   Procedure: LAPAROSCOPIC VERSES OPEN LEFT HEMI COLECTOMY ERAS PATHWAY;  Surgeon: Andria Meuse, MD;  Location: [facility];  Service: General;  Laterality: Left;   PATCH ANGIOPLASTY Left 08/01/2016   Procedure: PATCH ANGIOPLASTY LEFT CAROTID ARTERY USING ZENOSURE BIOLOGIC PATCH;  Surgeon: Chuck Hint, MD;  Location: [facility] OR;  Service: Vascular;  Laterality: Left;   POLYPECTOMY  12/01/2018   Procedure: POLYPECTOMY;  Surgeon: Malissa Hippo, MD;  [facility];  Service: Endoscopy;;   SUBMUCOSAL INJECTION  10/07/2017   Procedure: SUBMUCOSAL INJECTION;  Surgeon: Andria Meuse, MD;  Location: [facility];  Service: General;;   TONSILLECTOMY      Family History  Problem Relation Age of Onset   Cancer Mother    Breast cancer Mother        lived to be 66   CVA Mother    CAD Mother    Arthritis Mother    Hypertension Mother    Other Father  gunshot wound   Early death Father        GSW   Cancer Brother        Stomach Cancer   Cancer Brother        Lung Cancer   Stroke Maternal Aunt    Alcohol abuse Maternal Uncle    Stroke Maternal Grandfather    Colon cancer Neg Hx     Social History   Socioeconomic History   Marital status: Divorced    Spouse name: Not on file   Number of children: 2   Years of education: 12   Highest education level: Not on file  Occupational History   Occupation: disabled Cytogeneticist x8 years; 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[place], [place], [place], [place], [place], [place], [place], [place]    Comment: alcoholic  Tobacco Use    Smoking status: Former    Current packs/day: 0.00    Average packs/day: 0.5 packs/day for 29.0 years (14.5 ttl pk-yrs)    Types: Cigarettes    Start date: 84    Quit date: 2006    Years since quitting: 18.7   Smokeless tobacco: Never  Vaping Use   Vaping status: Never Used  Substance and Sexual Activity   Alcohol use: Not Currently    Comment: quit 2006   Drug use: Not Currently    Comment: remote h/o heavy marijuana use, also used cocaine and others   Sexual activity: Not Currently  Other Topics Concern   Not on file  Social History Narrative   Divorced   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Electronics engineer for 8 years, EMT certified      Lives alone   Likes to walk      2 sons -in [place] and [place]   Social Determinants of Health   Financial Resource Strain: Low Risk  (03/10/2022)   Overall Financial Resource Strain (CARDIA)    Difficulty of Paying Living Expenses: Not hard at all  Food Insecurity: No Food Insecurity (03/10/2022)   Hunger Vital Sign    Worried About Running Out of Food in the Last Year: Never true    Ran Out of Food in the Last Year: Never true  Transportation Needs: No Transportation Needs (03/10/2022)   PRAPARE - Administrator, Civil Service (Medical): No    Lack of Transportation (Non-Medical): No  Physical Activity: Sufficiently Active (03/10/2022)   Exercise Vital Sign    Days of Exercise per Week: 7 days    Minutes of Exercise per Session: 30 min  Stress: No Stress Concern Present (03/10/2022)   [employer] of Occupational Health - Occupational Stress Questionnaire    Feeling of Stress : Not at all  Social Connections: Moderately Isolated (03/10/2022)   Social Connection and Isolation Panel [NHANES]    Frequency of Communication with Friends and Family: More than three times a week    Frequency of Social Gatherings with Friends and Family: More than three times a week    Attends Religious Services: More than 4 times per year    Active Member of [employer] or  Organizations: No    Attends Banker Meetings: Never    Marital Status: Divorced  Catering manager Violence: Not At Risk (03/10/2022)   Humiliation, Afraid, Rape, and Kick questionnaire    Fear of Current or Ex-Partner: No    Emotionally Abused: No    Physically Abused: No    Sexually Abused: No    Outpatient Medications Prior to Visit  Medication Sig Dispense Refill   aspirin EC 81  MG tablet Take 1 tablet (81 mg total) by mouth daily. 90 tablet 3   atorvastatin (LIPITOR) 80 MG tablet TAKE 1 TABLET(80 MG) BY MOUTH DAILY 90 tablet 3   carvedilol (COREG) 6.25 MG tablet Take 1 tablet (6.25 mg total) by mouth 2 (two) times daily with a meal. 180 tablet 1   Cholecalciferol (VITAMIN D) 50 MCG (2000 UT) CAPS Take 1 capsule by mouth daily.     clopidogrel (PLAVIX) 75 MG tablet TAKE 1 TABLET(75 MG) BY MOUTH DAILY 90 tablet 1   lisinopril (ZESTRIL) 40 MG tablet Take 1 tablet (40 mg total) by mouth daily. 90 tablet 1   carvedilol (COREG) 3.125 MG tablet TAKE 1 TABLET(3.125 MG) BY MOUTH TWICE DAILY WITH A MEAL 180 tablet 3   colchicine 0.6 MG tablet Take 1 tablet (0.6 mg total) by mouth daily. 20 tablet 0   No facility-administered medications prior to visit.    Allergies  Allergen Reactions   Blood-Group Specific Substance     No whole blood products   Diclofenac Sodium-Menthol Gel [Diclofenac Sodium-Menthol Crm]     Shortness of breath   Prednisone     Elevates blood pressure    Review of Systems  Constitutional:  Negative for chills and fever.  HENT:  Negative for congestion and sore throat.   Eyes:  Negative for pain and discharge.  Respiratory:  Negative for cough and shortness of breath.   Cardiovascular:  Negative for chest pain and palpitations.  Gastrointestinal:  Negative for constipation, diarrhea, nausea and vomiting.  Endocrine: Negative for polydipsia and polyuria.  Genitourinary:  Negative for dysuria and hematuria.  Musculoskeletal:  Negative for neck pain  and neck stiffness.       R knee pain and swelling  Skin:  Negative for rash.  Neurological:  Negative for dizziness, weakness, numbness and headaches.  Psychiatric/Behavioral:  Negative for agitation and behavioral problems.        Objective:    Physical Exam Vitals reviewed.  Constitutional:      General: He is not in acute distress.    Appearance: He is not diaphoretic.  HENT:     Head: Normocephalic and atraumatic.     Nose: Nose normal.     Mouth/Throat:     Mouth: Mucous membranes are moist.  Eyes:     General: No scleral icterus.    Extraocular Movements: Extraocular movements intact.  Cardiovascular:     Rate and Rhythm: Normal rate and regular rhythm.     Pulses: Normal pulses.     Heart sounds: Normal heart sounds. No murmur heard. Pulmonary:     Breath sounds: Normal breath sounds. No wheezing or rales.  Musculoskeletal:     Cervical back: Neck supple. No tenderness.     Right knee: Swelling and erythema present. Decreased range of motion.     Right lower leg: No edema.     Left lower leg: No edema.  Skin:    General: Skin is warm.     Findings: No rash.  Neurological:     General: No focal deficit present.     Mental Status: He is alert and oriented to person, place, and time.     Sensory: No sensory deficit.     Motor: No weakness.  Psychiatric:        Mood and Affect: Mood normal.        Behavior: Behavior normal.     BP 136/70 (BP Location: Left Arm)   Pulse 64  Ht 6\' 3"  (1.905 m)   Wt 196 lb 12.8 oz (89.3 kg)   SpO2 95%   BMI 24.60 kg/m  Wt Readings from Last 3 Encounters:  11/12/22 196 lb 12.8 oz (89.3 kg)  09/16/22 198 lb 9.6 oz (90.1 kg)  08/06/22 197 lb 12.8 oz (89.7 kg)        Assessment & Plan:   Problem List Items Addressed This Visit       Other   Acute idiopathic gout    Has had recurrent flareup of gout of right great toe, has right knee pain and swelling currently - cellulitis is likely, but has pain upon movement,  which could be due to inflammatory arthritis Avoid oral NSAIDs due to history of CKD, CAD and CVA Did not tolerate oral prednisone Colchicine for acute flareup      Relevant Medications   colchicine 0.6 MG tablet   Cellulitis of right lower extremity - Primary    Has warmth and erythema His pain is superficial, so started Keflex for cellulitis      Relevant Medications   cephALEXin (KEFLEX) 500 MG capsule     Meds ordered this encounter  Medications   cephALEXin (KEFLEX) 500 MG capsule    Sig: Take 1 capsule (500 mg total) by mouth 3 (three) times daily.    Dispense:  15 capsule    Refill:  0   colchicine 0.6 MG tablet    Sig: Take 2 tablets at once, then take 1 tablet 1 hour later. Take 1 tablet once daily from Day 2.    Dispense:  20 tablet    Refill:  0     Rage Beever Concha Se, MD

## 2022-11-12 NOTE — Assessment & Plan Note (Addendum)
Has had recurrent flareup of gout of right great toe, has right knee pain and swelling currently - cellulitis is likely, but has pain upon movement, which could be due to inflammatory arthritis Avoid oral NSAIDs due to history of CKD, CAD and CVA Did not tolerate oral prednisone Colchicine for acute flareup

## 2022-11-12 NOTE — Patient Instructions (Signed)
Please start taking Cephalexin as prescribed.  Please start taking Colchicine as prescribed for gout.

## 2022-11-12 NOTE — Assessment & Plan Note (Signed)
Has warmth and erythema His pain is superficial, so started Keflex for cellulitis

## 2022-12-03 NOTE — Progress Notes (Deleted)
Cardiology Office Note   Date:  12/03/2022   ID:  Albert Morris, DOB May 28, 1950, MRN [id number]  PCP:  Anabel Halon, MD  Cardiologist:   Dietrich Pates, MD   Patient presents for f/u of of HFmrEF   History of Present Illness: Albert Morris is a 72 y.o. male with a history of CVA in 2018; L CEA 2018, mild LV dysfunction LVEF 45 to 50% with basal inferoseptal akinesis,   Myovue in 07/03/16 showed inferior    (moderate intensity base to apex)  LVEF 48%  Echo in April 2021 showed LVEF normal    I saw the pt in clinic in April 2023   Pt denies CP  Breathing is OK  No palpitaitons   NO dizziness   /no syncope  Walks about 1.5 miles a few times per week   Br  Banana  Apple   Cereal  Drinks water  [employer]    chicken   [employer] and chicken    I saw the pt in Feb 2024   No outpatient medications have been marked as taking for the 12/04/22 encounter (Appointment) with Pricilla Riffle, MD.     Allergies:   Blood-group specific substance, Diclofenac sodium-menthol gel [diclofenac sodium-menthol crm], and Prednisone   Past Medical History:  Diagnosis Date   Aortic insufficiency    mild AI with AVSC on echo 10/2022   Cerebellar stroke (HCC) 06/24/2016   Admitted with dizziness and "falling to the left" 06/24/16. Work up revealed multifocal acute ischemia within the right cerebellar hemisphere with occlusion versus severe stenosis of the right PICA and extensive, multifocal intracranial atherosclerosis with moderate stenoses of multiple distal MCA branches.   Colon polyps    CVA (cerebral vascular accident) (HCC) 06/21/2016   Diabetes mellitus without complication (HCC) 11/11/2018   Hyperlipidemia    Hypertension    Myocardial infarction (HCC)    Pneumonia    Substance abuse (HCC)    hx of EtOH and MJ    Past Surgical History:  Procedure Laterality Date   CAROTID ENDARTERECTOMY Left    COLON SURGERY  [phone]   growth removed, Laparoscopic Vs. open Left  hemicolectomy Dr. Liliane Shi 09-25-17   COLONOSCOPY N/A 06/26/2017   Procedure: COLONOSCOPY;  Surgeon: Malissa Hippo, MD;  [facility];  Service: Endoscopy;  Laterality: N/A;  730   COLONOSCOPY N/A 12/01/2018   Procedure: COLONOSCOPY;  Surgeon: Malissa Hippo, MD;  [facility];  Service: Endoscopy;  Laterality: N/A;  930   COLONOSCOPY WITH PROPOFOL N/A 10/07/2017   Procedure: COLONOSCOPY WITH PROPOFOL WITH TATTOO;  Surgeon: Andria Meuse, MD;  Location: [facility];  Service: General;  Laterality: N/A;   ENDARTERECTOMY Left 08/01/2016   Procedure: ENDARTERECTOMY LEFT CAROTID;  Surgeon: Chuck Hint, MD;  Location: [facility] OR;  Service: Vascular;  Laterality: Left;   FLEXIBLE SIGMOIDOSCOPY N/A 10/08/2017   Procedure: FLEXIBLE SIGMOIDOSCOPY;  Surgeon: Andria Meuse, MD;  Location: [facility];  Service: General;  Laterality: N/A;   LAPAROSCOPIC RIGHT HEMI COLECTOMY Left 10/08/2017   Procedure: LAPAROSCOPIC VERSES OPEN LEFT HEMI COLECTOMY ERAS PATHWAY;  Surgeon: Andria Meuse, MD;  Location: [facility];  Service: General;  Laterality: Left;   PATCH ANGIOPLASTY Left 08/01/2016   Procedure: PATCH ANGIOPLASTY LEFT CAROTID ARTERY USING ZENOSURE BIOLOGIC PATCH;  Surgeon: Chuck Hint, MD;  Location: [facility] OR;  Service: Vascular;  Laterality: Left;   POLYPECTOMY  12/01/2018  Procedure: POLYPECTOMY;  Surgeon: Malissa Hippo, MD;  [facility];  Service: Endoscopy;;   SUBMUCOSAL INJECTION  10/07/2017   Procedure: SUBMUCOSAL INJECTION;  Surgeon: Andria Meuse, MD;  Location: [facility];  Service: General;;   TONSILLECTOMY       Social History:  The patient  reports that he quit smoking about 18 years ago. His smoking use included cigarettes. He started smoking about 47 years ago. He has a 14.5 pack-year smoking history. He has never used smokeless tobacco. He reports that he does not currently use alcohol. He reports that he does not  currently use drugs.   Family History:  The patient's family history includes Alcohol abuse in his maternal uncle; Arthritis in his mother; Breast cancer in his mother; CAD in his mother; CVA in his mother; Cancer in his brother, brother, and mother; Early death in his father; Hypertension in his mother; Other in his father; Stroke in his maternal aunt and maternal grandfather.    ROS:  Please see the history of present illness. All other systems are reviewed and  Negative to the above problem except as noted.    PHYSICAL EXAM: VS:  There were no vitals taken for this visit.  GEN: Well nourished, well developed, in no acute distress  HEENT: normal  Neck: no JVD, carotid bruit Cardiac: RRR; II/VI systolic murmur base.  No LE  edema  Respiratory:  clear to auscultation GI: soft, nontender, nondistended, + BS  No hepatomegaly  MS: no deformity Moving all extremities   Skin: warm and dry, no rash Neuro:  Strength and sensation are intact Psych: euthymic mood, full affect 5 bpm   LVH with repolartization abnormality   Occasional [facility]  Myovue May 2018  No diagnostic ST segment changes over baseline abnormalities. Rare PVC. Medium sized, moderate intensity, inferior defect from apex to base that is fixed, more prominent on rest imaging than stress. Most consistent with scar, although there could be a component of soft tissue attenuation. There are no large ischemic territories noted. This is an intermediate risk study. Nuclear stress EF: 48%.     Echo   05/25/19  1. Left ventricular ejection fraction, by estimation, is 65 to 70%. The left ventricle has normal function. The left ventricle has no regional wall motion abnormalities. There is moderate left ventricular hypertrophy. Left ventricular diastolic parameters are consistent with Grade I diastolic dysfunction (impaired relaxation). 2. Right ventricular systolic function is normal. The right ventricular size is normal. There is mildly  elevated pulmonary artery systolic pressure. 3. The mitral valve is normal in structure. No evidence of mitral valve regurgitation. No evidence of mitral stenosis. 4. The aortic valve has an indeterminant number of cusps. Aortic valve regurgitation is mild to moderate. No aortic stenosis is present. 5. Aortic dilatation noted. There is mild dilatation of the ascending aorta measuring 40 mm. 6. The inferior vena cava is normal in size with greater than 50% respiratory variability, suggesting right atrial pressure of 3 mmHg.  Carotid USN  Jan 2022  Left Carotid: Patent carotid endarterectomy with velocity in the left ICA consistent with a 1-39% stenosis. Vertebrals: Bilateral vertebral arteries demonstrate antegrade flow. The right vertebral artery somewhat difficult to visualize Subclavians: Normal flow hemodynamics were seen in bilateral subclavian arteries.  Lipid Panel    Component Value Date/Time   CHOL 176 05/08/2022 0801   TRIG 75 05/08/2022 0801   HDL 46 05/08/2022 0801   CHOLHDL 3.8 05/08/2022 0801   CHOLHDL 4.1  05/21/2020 1109   VLDL 13 09/17/2016 0720   LDLCALC 116 (H) 05/08/2022 0801   LDLCALC 86 05/21/2020 1109      Wt Readings from Last 3 Encounters:  11/12/22 196 lb 12.8 oz (89.3 kg)  09/16/22 198 lb 9.6 oz (90.1 kg)  08/06/22 197 lb 12.8 oz (89.7 kg)      ASSESSMENT AND PLAN:  1  Hx of LV dysfunction    Last echo in 2021 LVEF normal       2  Hx CV dz   S/p LCEA   Follow with vascular  3  HL   LDL In JUly 98    Will add Zetia and follow up in 8 wks  Keep on lipitor  4  HTN  BP is well controlled    5  AV dz  Pt with mild to mod insuff in 2021   Set to have echo in fall and I will see him after    Stay active   F/U next fall    Current medicines are reviewed at length with the patient today.  The patient does not have concerns regarding medicines.  Signed, Dietrich Pates, MD  12/03/2022 5:06 PM    [facility] [address]  [place], [place], [place]  [address] Phone: [phone]; Fax: [phone]

## 2022-12-04 ENCOUNTER — Ambulatory Visit: Payer: Medicare HMO | Attending: Internal Medicine | Admitting: Internal Medicine

## 2022-12-31 ENCOUNTER — Other Ambulatory Visit: Payer: Self-pay

## 2022-12-31 ENCOUNTER — Telehealth: Payer: Self-pay | Admitting: Internal Medicine

## 2022-12-31 DIAGNOSIS — Z8673 Personal history of transient ischemic attack (TIA), and cerebral infarction without residual deficits: Secondary | ICD-10-CM

## 2022-12-31 MED ORDER — CLOPIDOGREL BISULFATE 75 MG PO TABS
ORAL_TABLET | ORAL | 1 refills | Status: DC
Start: 2022-12-31 — End: 2023-06-23

## 2022-12-31 NOTE — Telephone Encounter (Signed)
Refills sent to pharmacy. 

## 2022-12-31 NOTE — Telephone Encounter (Signed)
Prescription Request  12/31/2022  LOV: 11/12/2022  What is the name of the medication or equipment? clopidogrel (PLAVIX) 75 MG tablet [[id number]]   Have you contacted your pharmacy to request a refill? No   Which pharmacy would you like this sent to? [facility] [address] [facility]   Patient notified that their request is being sent to the clinical staff for review and that they should receive a response within 2 business days.   Please advise at  walked into office

## 2023-01-12 DIAGNOSIS — E782 Mixed hyperlipidemia: Secondary | ICD-10-CM | POA: Diagnosis not present

## 2023-01-12 DIAGNOSIS — Z125 Encounter for screening for malignant neoplasm of prostate: Secondary | ICD-10-CM | POA: Diagnosis not present

## 2023-01-12 DIAGNOSIS — N1831 Chronic kidney disease, stage 3a: Secondary | ICD-10-CM | POA: Diagnosis not present

## 2023-01-12 DIAGNOSIS — E1159 Type 2 diabetes mellitus with other circulatory complications: Secondary | ICD-10-CM | POA: Diagnosis not present

## 2023-01-13 LAB — LIPID PANEL
Chol/HDL Ratio: 3 {ratio} (ref 0.0–5.0)
Cholesterol, Total: 140 mg/dL (ref 100–199)
HDL: 47 mg/dL (ref >39)
LDL Chol Calc (NIH): 83 mg/dL (ref 0–99)
Triglycerides: 44 mg/dL (ref 0–149)
VLDL Cholesterol Cal: 10 mg/dL (ref 5–40)

## 2023-01-13 LAB — CMP14+EGFR
ALT: 26 [IU]/L (ref 0–44)
AST: 29 [IU]/L (ref 0–40)
Albumin: 4.4 g/dL (ref 3.8–4.8)
Alkaline Phosphatase: 91 [IU]/L (ref 44–121)
BUN/Creatinine Ratio: 12 (ref 10–24)
BUN: 15 mg/dL (ref 8–27)
Bilirubin Total: 0.5 mg/dL (ref 0.0–1.2)
CO2: 25 mmol/L (ref 20–29)
Calcium: 9.8 mg/dL (ref 8.6–10.2)
Chloride: 99 mmol/L (ref 96–106)
Creatinine, Ser: 1.29 mg/dL — ABNORMAL HIGH (ref 0.76–1.27)
Globulin, Total: 2.7 g/dL (ref 1.5–4.5)
Glucose: 105 mg/dL — ABNORMAL HIGH (ref 70–99)
Potassium: 4.9 mmol/L (ref 3.5–5.2)
Sodium: 137 mmol/L (ref 134–144)
Total Protein: 7.1 g/dL (ref 6.0–8.5)
eGFR: 59 mL/min/{1.73_m2} — ABNORMAL LOW (ref >59)

## 2023-01-13 LAB — PSA: Prostate Specific Ag, Serum: 1.6 ng/mL (ref 0.0–4.0)

## 2023-01-13 LAB — HEMOGLOBIN A1C
Est. average glucose Bld gHb Est-mCnc: 143 mg/dL
Hgb A1c MFr Bld: 6.6 % — ABNORMAL HIGH (ref 4.8–5.6)

## 2023-01-19 ENCOUNTER — Ambulatory Visit (INDEPENDENT_AMBULATORY_CARE_PROVIDER_SITE_OTHER): Payer: Medicare HMO | Admitting: Internal Medicine

## 2023-01-19 ENCOUNTER — Encounter: Payer: Self-pay | Admitting: Internal Medicine

## 2023-01-19 VITALS — BP 166/70 | HR 67 | Ht 75.0 in | Wt 201.4 lb

## 2023-01-19 DIAGNOSIS — Z7984 Long term (current) use of oral hypoglycemic drugs: Secondary | ICD-10-CM

## 2023-01-19 DIAGNOSIS — I739 Peripheral vascular disease, unspecified: Secondary | ICD-10-CM

## 2023-01-19 DIAGNOSIS — I10 Essential (primary) hypertension: Secondary | ICD-10-CM

## 2023-01-19 DIAGNOSIS — N1831 Chronic kidney disease, stage 3a: Secondary | ICD-10-CM | POA: Diagnosis not present

## 2023-01-19 DIAGNOSIS — E1159 Type 2 diabetes mellitus with other circulatory complications: Secondary | ICD-10-CM

## 2023-01-19 DIAGNOSIS — I5032 Chronic diastolic (congestive) heart failure: Secondary | ICD-10-CM

## 2023-01-19 MED ORDER — HYDRALAZINE HCL 25 MG PO TABS
25.0000 mg | ORAL_TABLET | Freq: Two times a day (BID) | ORAL | 3 refills | Status: DC
Start: 2023-01-19 — End: 2023-03-23

## 2023-01-19 NOTE — Assessment & Plan Note (Signed)
BP Readings from Last 1 Encounters:  01/19/23 (!) 166/70   Uncontrolled now On lisinopril 40 mg QD and Coreg 6/25 mg BID Avoid increasing dose of Coreg as his HR is in 60s currently Added Hydralazine 25 mg BID Due to recurrent episodes of gout, discontinued HCTZ Counseled for compliance with the medications Advised DASH diet and moderate exercise/walking as tolerated

## 2023-01-19 NOTE — Assessment & Plan Note (Signed)
BMP reviewed, GFR stays around 55 Likely from HTN, DM and age related decline On lisinopril for HTN Had added Farxiga for CKD and HFpEF, but did not take it due to concern for side effects Avoid nephrotoxic agents Advised to improve fluid intake to at least 64 ounces of fluid in a day

## 2023-01-19 NOTE — Assessment & Plan Note (Signed)
Has blackish discoloration of the toes chronically, but warm extremities Has history of CAD, CVA and carotid artery stenosis [place] ABI screening test showed ABI index of 0.61 on right leg, likely due to right superficial femoral artery occlusion, had vascular surgery evaluation-recommended conservative treatment Currently on DAPT and statin

## 2023-01-19 NOTE — Progress Notes (Signed)
Established Patient Office Visit  Subjective:  Patient ID: Albert Morris, male    DOB: Mar 12, 1950  Age: 72 y.o. MRN: [id number]  CC:  Chief Complaint  Patient presents with   Diabetes    Follow up    Hypertension    Follow up     HPI Albert Morris is a 72 y.o. male with past medical history of HTN, CAD, type II DM with HLD, CVA and tobacco abuse who presents for f/u of his chronic medical conditions.  HTN: BP was significantly elevated today. Takes lisinopril 40 mg QD and Coreg 6.25 mg twice daily regularly.  Dose of Coreg was recently increased to 6.25 mg from 3.125 mg due to uncontrolled BP, and his HCTZ was discontinued recently due to recurrent gout flareup.  He attributes his elevated BP to eating at [employer] this morning, although he reports his BP was 140s/70s at home this morning before breakfast.  Patient denies headache, dizziness, chest pain, dyspnea or palpitations.   HLD: He has been taking Lipitor now.   Type 2 DM: His HbA1C is stable at 6.6. Diet controlled.  He was given [place] considering history of HFpEF, CAD and PAD, but he did not take it considering its side effects.  He was counseled about its potential benefits and his low risk of side effects in the last visit, but does not prefer to take medicine for it. Denies any polyuria or polyphagia.  Chronic cough: He has chronic cough with clear expectoration.  Denies any dyspnea or wheezing.  He reports smoking about 0.3 pack/day for 30 years, quit in 2006.   Past Medical History:  Diagnosis Date   Aortic insufficiency    mild AI with AVSC on echo 10/2022   Cerebellar stroke (HCC) 06/24/2016   Admitted with dizziness and "falling to the left" 06/24/16. Work up revealed multifocal acute ischemia within the right cerebellar hemisphere with occlusion versus severe stenosis of the right PICA and extensive, multifocal intracranial atherosclerosis with moderate stenoses of multiple distal MCA branches.   Colon  polyps    CVA (cerebral vascular accident) (HCC) 06/21/2016   Diabetes mellitus without complication (HCC) 11/11/2018   Hyperlipidemia    Hypertension    Myocardial infarction (HCC)    Pneumonia    Substance abuse (HCC)    hx of EtOH and MJ    Past Surgical History:  Procedure Laterality Date   CAROTID ENDARTERECTOMY Left    COLON SURGERY  [phone]   growth removed, Laparoscopic Vs. open Left hemicolectomy Dr. Liliane Shi 09-25-17   COLONOSCOPY N/A 06/26/2017   Procedure: COLONOSCOPY;  Surgeon: Malissa Hippo, MD;  Location: [facility];  Service: Endoscopy;  Laterality: N/A;  730   COLONOSCOPY N/A 12/01/2018   Procedure: COLONOSCOPY;  Surgeon: Malissa Hippo, MD;  Location: [facility];  Service: Endoscopy;  Laterality: N/A;  930   COLONOSCOPY WITH PROPOFOL N/A 10/07/2017   Procedure: COLONOSCOPY WITH PROPOFOL WITH TATTOO;  Surgeon: Andria Meuse, MD;  Location: [facility];  Service: General;  Laterality: N/A;   ENDARTERECTOMY Left 08/01/2016   Procedure: ENDARTERECTOMY LEFT CAROTID;  Surgeon: Chuck Hint, MD;  Location: [facility] OR;  Service: Vascular;  Laterality: Left;   FLEXIBLE SIGMOIDOSCOPY N/A 10/08/2017   Procedure: FLEXIBLE SIGMOIDOSCOPY;  Surgeon: Andria Meuse, MD;  Location: [facility];  Service: General;  Laterality: N/A;   LAPAROSCOPIC RIGHT HEMI COLECTOMY Left 10/08/2017   Procedure: LAPAROSCOPIC VERSES OPEN LEFT HEMI COLECTOMY ERAS PATHWAY;  Surgeon: Marin Olp  M, MD;  Location: [facility];  Service: General;  Laterality: Left;   PATCH ANGIOPLASTY Left 08/01/2016   Procedure: PATCH ANGIOPLASTY LEFT CAROTID ARTERY USING ZENOSURE BIOLOGIC PATCH;  Surgeon: Chuck Hint, MD;  Location: [facility] OR;  Service: Vascular;  Laterality: Left;   POLYPECTOMY  12/01/2018   Procedure: POLYPECTOMY;  Surgeon: Malissa Hippo, MD;  Location: [facility];  Service: Endoscopy;;   SUBMUCOSAL INJECTION  10/07/2017   Procedure: SUBMUCOSAL INJECTION;  Surgeon: Andria Meuse, MD;  Location: [facility];  Service: General;;   TONSILLECTOMY      Family History  Problem Relation Age of Onset   Cancer Mother    Breast cancer Mother        lived to be 30   CVA Mother    CAD Mother    Arthritis Mother    Hypertension Mother    Other Father        gunshot wound   Early death Father        GSW   Cancer Brother        Stomach Cancer   Cancer Brother        Lung Cancer   Stroke Maternal Aunt    Alcohol abuse Maternal Uncle    Stroke Maternal Grandfather    Colon cancer Neg Hx     Social History   Socioeconomic History   Marital status: Divorced    Spouse name: Not on file   Number of children: 2   Years of education: 12   Highest education level: Not on file  Occupational History   Occupation: disabled Cytogeneticist x8 years; 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[place], [place], [place], [place], [place], [place], [place], [place]    Comment: alcoholic  Tobacco Use   Smoking status: Former    Current packs/day: 0.00    Average packs/day: 0.5 packs/day for 29.0 years (14.5 ttl pk-yrs)    Types: Cigarettes    Start date: 69    Quit date: 2006    Years since quitting: 18.9   Smokeless tobacco: Never  Vaping Use   Vaping status: Never Used  Substance and Sexual Activity   Alcohol use: Not Currently    Comment: quit 2006   Drug use: Not Currently    Comment: remote h/o heavy marijuana use, also used cocaine and others   Sexual activity: Not Currently  Other Topics Concern   Not on file  Social History Narrative   Divorced   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Electronics engineer for 8 years, EMT certified      Lives alone   Likes to walk      2 sons -in [place] and [place]   Social Determinants of Health   Financial Resource Strain: Low Risk  (03/10/2022)   Overall Financial Resource Strain (CARDIA)    Difficulty of Paying Living Expenses: Not hard at all  Food Insecurity: No Food Insecurity (03/10/2022)   Hunger Vital Sign    Worried About Running Out of Food in the Last Year: Never true     Ran Out of Food in the Last Year: Never true  Transportation Needs: No Transportation Needs (03/10/2022)   PRAPARE - Administrator, Civil Service (Medical): No    Lack of Transportation (Non-Medical): No  Physical Activity: Sufficiently Active (03/10/2022)   Exercise Vital Sign    Days of Exercise per Week: 7 days    Minutes of Exercise per Session: 30 min  Stress: No Stress Concern Present (03/10/2022)   [employer] of  Occupational Health - Occupational Stress Questionnaire    Feeling of Stress : Not at all  Social Connections: Moderately Isolated (03/10/2022)   Social Connection and Isolation Panel [NHANES]    Frequency of Communication with Friends and Family: More than three times a week    Frequency of Social Gatherings with Friends and Family: More than three times a week    Attends Religious Services: More than 4 times per year    Active Member of [employer] or Organizations: No    Attends Banker Meetings: Never    Marital Status: Divorced  Catering manager Violence: Not At Risk (03/10/2022)   Humiliation, Afraid, Rape, and Kick questionnaire    Fear of Current or Ex-Partner: No    Emotionally Abused: No    Physically Abused: No    Sexually Abused: No    Outpatient Medications Prior to Visit  Medication Sig Dispense Refill   aspirin EC 81 MG tablet Take 1 tablet (81 mg total) by mouth daily. 90 tablet 3   atorvastatin (LIPITOR) 80 MG tablet TAKE 1 TABLET(80 MG) BY MOUTH DAILY 90 tablet 3   carvedilol (COREG) 6.25 MG tablet Take 1 tablet (6.25 mg total) by mouth 2 (two) times daily with a meal. 180 tablet 1   Cholecalciferol (VITAMIN D) 50 MCG (2000 UT) CAPS Take 1 capsule by mouth daily.     clopidogrel (PLAVIX) 75 MG tablet TAKE 1 TABLET(75 MG) BY MOUTH DAILY 90 tablet 1   colchicine 0.6 MG tablet Take 2 tablets at once, then take 1 tablet 1 hour later. Take 1 tablet once daily from Day 2. 20 tablet 0   lisinopril (ZESTRIL) 40 MG tablet Take 1 tablet  (40 mg total) by mouth daily. 90 tablet 1   cephALEXin (KEFLEX) 500 MG capsule Take 1 capsule (500 mg total) by mouth 3 (three) times daily. 15 capsule 0   No facility-administered medications prior to visit.    Allergies  Allergen Reactions   Blood-Group Specific Substance     No whole blood products   Diclofenac Sodium-Menthol Gel [Diclofenac Sodium-Menthol Crm]     Shortness of breath   Prednisone     Elevates blood pressure    ROS Review of Systems  Constitutional:  Negative for chills and fever.  HENT:  Negative for congestion and sore throat.   Eyes:  Negative for pain and discharge.  Respiratory:  Positive for cough. Negative for shortness of breath.   Cardiovascular:  Negative for chest pain and palpitations.  Gastrointestinal:  Negative for constipation, diarrhea, nausea and vomiting.  Endocrine: Negative for polydipsia and polyuria.  Genitourinary:  Negative for dysuria and hematuria.  Musculoskeletal:  Negative for neck pain and neck stiffness.  Skin:  Negative for rash.  Neurological:  Negative for dizziness, weakness, numbness and headaches.  Psychiatric/Behavioral:  Negative for agitation and behavioral problems.       Objective:    Physical Exam Vitals reviewed.  Constitutional:      General: He is not in acute distress.    Appearance: He is not diaphoretic.  HENT:     Head: Normocephalic and atraumatic.     Nose: Nose normal.     Mouth/Throat:     Mouth: Mucous membranes are moist.  Eyes:     General: No scleral icterus.    Extraocular Movements: Extraocular movements intact.  Cardiovascular:     Rate and Rhythm: Normal rate and regular rhythm.     Heart sounds: Normal heart sounds. No murmur  heard. Pulmonary:     Breath sounds: Normal breath sounds. No wheezing or rales.  Musculoskeletal:     Cervical back: Neck supple. No tenderness.     Right lower leg: No edema.     Left lower leg: No edema.  Skin:    General: Skin is warm.     Findings:  No rash.  Neurological:     General: No focal deficit present.     Mental Status: He is alert and oriented to person, place, and time.     Sensory: No sensory deficit.     Motor: No weakness.  Psychiatric:        Mood and Affect: Mood normal.        Behavior: Behavior normal.     BP (!) 166/70 (BP Location: Left Arm)   Pulse 67   Ht 6\' 3"  (1.905 m)   Wt 201 lb 6.4 oz (91.4 kg)   SpO2 97%   BMI 25.17 kg/m  Wt Readings from Last 3 Encounters:  01/19/23 201 lb 6.4 oz (91.4 kg)  11/12/22 196 lb 12.8 oz (89.3 kg)  09/16/22 198 lb 9.6 oz (90.1 kg)    Lab Results  Component Value Date   TSH 1.490 09/04/2021   Lab Results  Component Value Date   WBC 5.5 09/08/2022   HGB 12.4 (L) 09/08/2022   HCT 36.6 (L) 09/08/2022   MCV 86 09/08/2022   PLT 286 09/08/2022   Lab Results  Component Value Date   NA 137 01/12/2023   K 4.9 01/12/2023   CO2 25 01/12/2023   GLUCOSE 105 (H) 01/12/2023   BUN 15 01/12/2023   CREATININE 1.29 (H) 01/12/2023   BILITOT 0.5 01/12/2023   ALKPHOS 91 01/12/2023   AST 29 01/12/2023   ALT 26 01/12/2023   PROT 7.1 01/12/2023   ALBUMIN 4.4 01/12/2023   CALCIUM 9.8 01/12/2023   ANIONGAP 11 10/12/2017   EGFR 59 (L) 01/12/2023   Lab Results  Component Value Date   CHOL 140 01/12/2023   Lab Results  Component Value Date   HDL 47 01/12/2023   Lab Results  Component Value Date   LDLCALC 83 01/12/2023   Lab Results  Component Value Date   TRIG 44 01/12/2023   Lab Results  Component Value Date   CHOLHDL 3.0 01/12/2023   Lab Results  Component Value Date   HGBA1C 6.6 (H) 01/12/2023      Assessment & Plan:   Problem List Items Addressed This Visit       Cardiovascular and Mediastinum   Essential hypertension - Primary    BP Readings from Last 1 Encounters:  01/19/23 (!) 166/70   Uncontrolled now On lisinopril 40 mg QD and Coreg 6/25 mg BID Avoid increasing dose of Coreg as his HR is in 60s currently Added Hydralazine 25 mg  BID Due to recurrent episodes of gout, discontinued HCTZ Counseled for compliance with the medications Advised DASH diet and moderate exercise/walking as tolerated      Relevant Medications   hydrALAZINE (APRESOLINE) 25 MG tablet   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)    Lab Results  Component Value Date   HGBA1C 6.6 (H) 01/12/2023   Diet controlled Had added Farxiga for HFpEF and CKD, but did not take it despite counseling Advised to follow diabetic diet On statin and ACEi F/u CMP and lipid panel Diabetic eye exam: Advised to follow up with Ophthalmology for diabetic eye exam  Relevant Medications   hydrALAZINE (APRESOLINE) 25 MG tablet   Chronic heart failure with preserved ejection fraction (HCC)    Last Echo reviewed On ACEi and B-blocker Had added Farxiga, he did not take it as he was concerned about its side effects, after counseling he still denied to start taking it      Relevant Medications   hydrALAZINE (APRESOLINE) 25 MG tablet   PAD (peripheral artery disease) (HCC)    Has blackish discoloration of the toes chronically, but warm extremities Has history of CAD, CVA and carotid artery stenosis [place] ABI screening test showed ABI index of 0.61 on right leg, likely due to right superficial femoral artery occlusion, had vascular surgery evaluation-recommended conservative treatment Currently on DAPT and statin       Relevant Medications   hydrALAZINE (APRESOLINE) 25 MG tablet     Genitourinary   Stage 3a chronic kidney disease (HCC)    BMP reviewed, GFR stays around 55 Likely from HTN, DM and age related decline On lisinopril for HTN Had added Farxiga for CKD and HFpEF, but did not take it due to concern for side effects Avoid nephrotoxic agents Advised to improve fluid intake to at least 64 ounces of fluid in a day        Meds ordered this encounter  Medications   hydrALAZINE (APRESOLINE) 25 MG tablet    Sig: Take 1  tablet (25 mg total) by mouth 2 (two) times daily.    Dispense:  60 tablet    Refill:  3    Follow-up: Return in about 2 months (around 03/22/2023).    Anabel Halon, MD

## 2023-01-19 NOTE — Patient Instructions (Signed)
Please start taking Hydralazine as prescribed.  Please continue to take medications as prescribed.  Please continue to follow low carb diet and perform moderate exercise/walking as tolerated.

## 2023-01-19 NOTE — Assessment & Plan Note (Signed)
Last Echo reviewed On ACEi and B-blocker Had added Farxiga, he did not take it as he was concerned about its side effects, after counseling he still denied to start taking it

## 2023-01-19 NOTE — Assessment & Plan Note (Signed)
Lab Results  Component Value Date   HGBA1C 6.6 (H) 01/12/2023   Diet controlled Had added Farxiga for HFpEF and CKD, but did not take it despite counseling Advised to follow diabetic diet On statin and ACEi F/u CMP and lipid panel Diabetic eye exam: Advised to follow up with Ophthalmology for diabetic eye exam

## 2023-01-23 ENCOUNTER — Ambulatory Visit: Payer: Medicare HMO | Admitting: Podiatry

## 2023-01-23 ENCOUNTER — Encounter: Payer: Self-pay | Admitting: Podiatry

## 2023-01-23 DIAGNOSIS — M79675 Pain in left toe(s): Secondary | ICD-10-CM | POA: Diagnosis not present

## 2023-01-23 DIAGNOSIS — E119 Type 2 diabetes mellitus without complications: Secondary | ICD-10-CM | POA: Diagnosis not present

## 2023-01-23 DIAGNOSIS — M79674 Pain in right toe(s): Secondary | ICD-10-CM

## 2023-01-23 DIAGNOSIS — B351 Tinea unguium: Secondary | ICD-10-CM | POA: Diagnosis not present

## 2023-01-23 NOTE — Progress Notes (Signed)
This patient returns to my office for at risk foot care.  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  Patient is taking plavix.  This patient is unable to cut nails himself since the patient cannot reach his nails.These nails are painful walking and wearing shoes.  Patient says his vascular status is being evaluated.This patient presents for at risk foot care today.  General Appearance  Alert, conversant and in no acute stress.  Vascular  Dorsalis pedis and posterior tibial  pulses are weakly  palpable  bilaterally.  Capillary return is within normal limits  bilaterally. Temperature is within normal limits  bilaterally.  Neurologic  Senn-Weinstein monofilament wire test within normal limits/diminished   bilaterally. Muscle power within normal limits bilaterally.  Nails Thick disfigured discolored nails with subungual debris  from hallux to fifth toes bilaterally. No evidence of bacterial infection or drainage bilaterally.  Orthopedic  No limitations of motion  feet .  No crepitus or effusions noted.  No bony pathology or digital deformities noted. HAV  B/L.  Skin  normotropic skin with no porokeratosis noted bilaterally.  No signs of infections or ulcers noted.   Asymptomatic thickness of skin sub 5th met left foot.  Onychomycosis  Pain in right toes  Pain in left toes  Consent was obtained for treatment procedures.   Mechanical debridement of nails 1-5  bilaterally performed with a nail nipper.  Filed with dremel without incident.    Return office visit   3 months                  Told patient to return for periodic foot care and evaluation due to potential at risk complications.   Helane Gunther DPM

## 2023-02-13 ENCOUNTER — Ambulatory Visit: Payer: Medicare HMO | Admitting: Internal Medicine

## 2023-03-13 ENCOUNTER — Other Ambulatory Visit: Payer: Self-pay | Admitting: Internal Medicine

## 2023-03-13 DIAGNOSIS — I10 Essential (primary) hypertension: Secondary | ICD-10-CM

## 2023-03-18 ENCOUNTER — Ambulatory Visit (INDEPENDENT_AMBULATORY_CARE_PROVIDER_SITE_OTHER): Payer: Medicare HMO

## 2023-03-18 VITALS — BP 142/80 | Ht 75.0 in | Wt 205.0 lb

## 2023-03-18 DIAGNOSIS — Z2821 Immunization not carried out because of patient refusal: Secondary | ICD-10-CM

## 2023-03-18 DIAGNOSIS — Z0000 Encounter for general adult medical examination without abnormal findings: Secondary | ICD-10-CM | POA: Diagnosis not present

## 2023-03-18 NOTE — Progress Notes (Signed)
Because this visit was a virtual/telehealth visit,  certain criteria was not obtained, such a blood pressure, CBG if applicable, and timed get up and go. Any medications not marked as "taking" were not mentioned during the medication reconciliation part of the visit. Any vitals not documented were not able to be obtained due to this being a telehealth visit or patient was unable to self-report a recent blood pressure reading due to a lack of equipment at home via telehealth. Vitals that have been documented are verbally provided by the patient.  Interactive audio and video telecommunications were attempted between this provider and patient, however failed, due to patient having technical difficulties OR patient did not have access to video capability.  We continued and completed visit with audio only.  Subjective:   Albert Morris is a 73 y.o. male who presents for Medicare Annual/Subsequent preventive examination.  Visit Complete: Virtual I connected with  Claudette Head on 03/18/23 by a audio enabled telemedicine application and verified that I am speaking with the correct person using two identifiers.  Patient Location: Home  Provider Location: Home Office  I discussed the limitations of evaluation and management by telemedicine. The patient expressed understanding and agreed to proceed.  Vital Signs: Because this visit was a virtual/telehealth visit, some criteria may be missing or patient reported. Any vitals not documented were not able to be obtained and vitals that have been documented are patient reported.  Patient Medicare AWV questionnaire was completed by the patient on na; I have confirmed that all information answered by patient is correct and no changes since this date.  Cardiac Risk Factors include: advanced age (>13men, >75 women);dyslipidemia;hypertension;male gender     Objective:    Today's Vitals   03/18/23 1113  BP: (!) 142/80  Weight: 205 lb (93 kg)   Height: 6\' 3"  (1.905 m)   Body mass index is 25.62 kg/m.     03/18/2023   11:15 AM 03/10/2022   11:32 AM 02/25/2021    9:25 AM 09/03/2020    9:34 AM 02/07/2020   10:39 AM 12/01/2018    8:34 AM 10/08/2017    9:03 AM  Advanced Directives  Does Patient Have a Medical Advance Directive? Yes Yes Yes Yes Yes No No  Type of Estate agent of [place];Living will Living will;Healthcare Power of Attorney Living will  Healthcare Power of Attorney    Does patient want to make changes to medical advance directive? No - Patient declined No - Patient declined Yes (Inpatient - patient defers changing a medical advance directive at this time - Information given) No - Patient declined No - Patient declined    Copy of Healthcare Power of Attorney in Chart? Yes - validated most recent copy scanned in chart (See row information) No - copy requested   No - copy requested    Would patient like information on creating a medical advance directive?    No - Patient declined  No - Patient declined No - Patient declined    Current Medications (verified) Outpatient Encounter Medications as of 03/18/2023  Medication Sig   aspirin EC 81 MG tablet Take 1 tablet (81 mg total) by mouth daily.   atorvastatin (LIPITOR) 80 MG tablet TAKE 1 TABLET(80 MG) BY MOUTH DAILY   carvedilol (COREG) 6.25 MG tablet TAKE 1 TABLET(6.25 MG) BY MOUTH TWICE DAILY WITH A MEAL   Cholecalciferol (VITAMIN D) 50 MCG (2000 UT) CAPS Take 1 capsule by mouth daily.   clopidogrel (PLAVIX) 75  MG tablet TAKE 1 TABLET(75 MG) BY MOUTH DAILY   hydrALAZINE (APRESOLINE) 25 MG tablet Take 1 tablet (25 mg total) by mouth 2 (two) times daily.   lisinopril (ZESTRIL) 40 MG tablet Take 1 tablet (40 mg total) by mouth daily.   Vitamin D, Ergocalciferol, 50 MCG (2000 UT) CAPS Take 1 capsule by mouth daily.   colchicine 0.6 MG tablet Take 2 tablets at once, then take 1 tablet 1 hour later. Take 1 tablet once daily from Day 2. (Patient not taking:  Reported on 03/18/2023)   No facility-administered encounter medications on file as of 03/18/2023.    Allergies (verified) Blood-group specific substance, Diclofenac sodium-menthol gel [diclofenac sodium-menthol crm], and Prednisone   History: Past Medical History:  Diagnosis Date   Aortic insufficiency    mild AI with AVSC on echo 10/2022   Cerebellar stroke (HCC) 06/24/2016   Admitted with dizziness and "falling to the left" 06/24/16. Work up revealed multifocal acute ischemia within the right cerebellar hemisphere with occlusion versus severe stenosis of the right PICA and extensive, multifocal intracranial atherosclerosis with moderate stenoses of multiple distal MCA branches.   Colon polyps    CVA (cerebral vascular accident) (HCC) 06/21/2016   Diabetes mellitus without complication (HCC) 11/11/2018   Hyperlipidemia    Hypertension    Myocardial infarction (HCC)    Pneumonia    Substance abuse (HCC)    hx of EtOH and MJ   Past Surgical History:  Procedure Laterality Date   CAROTID ENDARTERECTOMY Left    COLON SURGERY  [phone]   growth removed, Laparoscopic Vs. open Left hemicolectomy Dr. Liliane Shi 09-25-17   COLONOSCOPY N/A 06/26/2017   Procedure: COLONOSCOPY;  Surgeon: Malissa Hippo, MD;  Location: [facility];  Service: Endoscopy;  Laterality: N/A;  730   COLONOSCOPY N/A 12/01/2018   Procedure: COLONOSCOPY;  Surgeon: Malissa Hippo, MD;  Location: [facility];  Service: Endoscopy;  Laterality: N/A;  930   COLONOSCOPY WITH PROPOFOL N/A 10/07/2017   Procedure: COLONOSCOPY WITH PROPOFOL WITH TATTOO;  Surgeon: Andria Meuse, MD;  Location: [facility];  Service: General;  Laterality: N/A;   ENDARTERECTOMY Left 08/01/2016   Procedure: ENDARTERECTOMY LEFT CAROTID;  Surgeon: Chuck Hint, MD;  Location: [facility] OR;  Service: Vascular;  Laterality: Left;   FLEXIBLE SIGMOIDOSCOPY N/A 10/08/2017   Procedure: FLEXIBLE SIGMOIDOSCOPY;  Surgeon: Andria Meuse, MD;   Location: [facility] ORS;  Service: General;  Laterality: N/A;   LAPAROSCOPIC RIGHT HEMI COLECTOMY Left 10/08/2017   Procedure: LAPAROSCOPIC VERSES OPEN LEFT HEMI COLECTOMY ERAS PATHWAY;  Surgeon: Andria Meuse, MD;  Location: [facility];  Service: General;  Laterality: Left;   PATCH ANGIOPLASTY Left 08/01/2016   Procedure: PATCH ANGIOPLASTY LEFT CAROTID ARTERY USING ZENOSURE BIOLOGIC PATCH;  Surgeon: Chuck Hint, MD;  Location: [facility] OR;  Service: Vascular;  Laterality: Left;   POLYPECTOMY  12/01/2018   Procedure: POLYPECTOMY;  Surgeon: Malissa Hippo, MD;  Location: [facility];  Service: Endoscopy;;   SUBMUCOSAL INJECTION  10/07/2017   Procedure: SUBMUCOSAL INJECTION;  Surgeon: Andria Meuse, MD;  Location: [facility];  Service: General;;   TONSILLECTOMY     Family History  Problem Relation Age of Onset   Cancer Mother    Breast cancer Mother        lived to be 48   CVA Mother    CAD Mother    Arthritis Mother    Hypertension Mother    Other Father  gunshot wound   Early death Father        GSW   Cancer Brother        Stomach Cancer   Cancer Brother        Lung Cancer   Stroke Maternal Aunt    Alcohol abuse Maternal Uncle    Stroke Maternal Grandfather    Colon cancer Neg Hx    Social History   Socioeconomic History   Marital status: Divorced    Spouse name: Not on file   Number of children: 2   Years of education: 12   Highest education level: Not on file  Occupational History   Occupation: disabled Cytogeneticist x8 years; 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[place], [place], [place], [place], [place], [place], [place], [place]    Comment: alcoholic  Tobacco Use   Smoking status: Former    Current packs/day: 0.00    Average packs/day: 0.5 packs/day for 29.0 years (14.5 ttl pk-yrs)    Types: Cigarettes    Start date: 32    Quit date: 2006    Years since quitting: 19.0   Smokeless tobacco: Never  Vaping Use   Vaping status: Never Used  Substance and Sexual Activity   Alcohol use: Not  Currently    Comment: quit 2006   Drug use: Not Currently    Comment: remote h/o heavy marijuana use, also used cocaine and others   Sexual activity: Not Currently  Other Topics Concern   Not on file  Social History Narrative   Divorced   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Electronics engineer for 8 years, EMT certified      Lives alone   Likes to walk      2 sons -in [place] and [place]   Social Drivers of Health   Financial Resource Strain: Low Risk  (03/10/2022)   Overall Financial Resource Strain (CARDIA)    Difficulty of Paying Living Expenses: Not hard at all  Food Insecurity: No Food Insecurity (03/10/2022)   Hunger Vital Sign    Worried About Running Out of Food in the Last Year: Never true    Ran Out of Food in the Last Year: Never true  Transportation Needs: No Transportation Needs (03/10/2022)   PRAPARE - Administrator, Civil Service (Medical): No    Lack of Transportation (Non-Medical): No  Physical Activity: Sufficiently Active (03/10/2022)   Exercise Vital Sign    Days of Exercise per Week: 7 days    Minutes of Exercise per Session: 30 min  Stress: No Stress Concern Present (03/10/2022)   [employer] of Occupational Health - Occupational Stress Questionnaire    Feeling of Stress : Not at all  Social Connections: Moderately Isolated (03/10/2022)   Social Connection and Isolation Panel [NHANES]    Frequency of Communication with Friends and Family: More than three times a week    Frequency of Social Gatherings with Friends and Family: More than three times a week    Attends Religious Services: More than 4 times per year    Active Member of [employer] or Organizations: No    Attends Engineer, structural: Never    Marital Status: Divorced    Tobacco Counseling Counseling given: Yes   Clinical Intake:  Pre-visit preparation completed: Yes  Pain : No/denies pain     BMI - recorded: 25.62 Nutritional Status: BMI 25 -29 Overweight Nutritional Risks: None Diabetes:  No  How often do you need to have someone help you when you read instructions, pamphlets, or other written materials from  your doctor or pharmacy?: 1 - Never  Interpreter Needed?: No  Information entered by :: Maryjean Ka CMA   Activities of Daily Living    03/18/2023   11:14 AM  In your present state of health, do you have any difficulty performing the following activities:  Hearing? 0  Vision? 0  Difficulty concentrating or making decisions? 0  Walking or climbing stairs? 0  Dressing or bathing? 0  Doing errands, shopping? 0  Preparing Food and eating ? N  Using the Toilet? N  In the past six months, have you accidently leaked urine? N  Do you have problems with loss of bowel control? N  Managing your Medications? N  Managing your Finances? N  Housekeeping or managing your Housekeeping? N    Patient Care Team: Anabel Halon, MD as PCP - General (Internal Medicine) Pricilla Riffle, MD as PCP - Cardiology (Cardiology) Helane Gunther, DPM as Consulting Physician (Podiatry)  Indicate any recent Medical Services you may have received from other than [facility] providers in the past year (date may be approximate).     Assessment:   This is a routine wellness examination for Alby.  Hearing/Vision screen Hearing Screening - Comments:: Patient denies any hearing difficulties.   Vision Screening - Comments:: Patient wears reading glasses only. Up to date with yearly eye exams. Patient sees Dr. Daisy Lazar w/ [facility] office.     Goals Addressed             This Visit's Progress    Patient Stated       Remain active and healthy       Depression Screen    01/19/2023    9:50 AM 11/12/2022    9:10 AM 09/16/2022    9:55 AM 05/15/2022    9:35 AM 03/10/2022   11:33 AM 03/10/2022   11:32 AM 01/13/2022    9:20 AM  PHQ 2/9 Scores  PHQ - 2 Score 0 0 0 0 0 0 0  PHQ- 9 Score 0          Fall Risk    03/18/2023   11:16 AM 01/19/2023    9:50 AM 11/12/2022    9:10  AM 09/16/2022    9:55 AM 05/15/2022    9:34 AM  Fall Risk   Falls in the past year? 0 0 0 0 0  Number falls in past yr: 0 0 0 0 0  Injury with Fall? 0 0 0 0 0  Risk for fall due to : No Fall Risks No Fall Risks     Follow up Falls prevention discussed Falls evaluation completed       MEDICARE RISK AT HOME: Medicare Risk at Home Any stairs in or around the home?: Yes If so, are there any without handrails?: No Home free of loose throw rugs in walkways, pet beds, electrical cords, etc?: Yes Adequate lighting in your home to reduce risk of falls?: Yes Life alert?: No Use of a cane, walker or w/c?: No Grab bars in the bathroom?: Yes Shower chair or bench in shower?: No Elevated toilet seat or a handicapped toilet?: No  TIMED UP AND GO:  Was the test performed?  No    Cognitive Function:    03/10/2022   11:34 AM 02/25/2021    9:26 AM  MMSE - Mini Mental State Exam  Not completed: Unable to complete Unable to complete        03/18/2023   11:17 AM 03/10/2022  11:35 AM 02/25/2021    9:26 AM 02/07/2020   10:46 AM  6CIT Screen  What Year? 0 points 0 points 0 points 0 points  What month? 0 points 0 points 0 points 0 points  What time? 0 points 0 points 0 points 0 points  Count back from 20 0 points 0 points 0 points 0 points  Months in reverse 0 points 0 points 0 points 0 points  Repeat phrase 0 points 0 points 0 points 0 points  Total Score 0 points 0 points 0 points 0 points    Immunizations There is no immunization history for the selected administration types on file for this patient.  TDAP Status: Patient declined vaccine Due, Education has been provided regarding the importance of this vaccine. Advised may receive this vaccine at local pharmacy or Health Dept. Aware to provide a copy of the vaccination record if obtained from local pharmacy or Health Dept. Verbalized acceptance and understanding.  Flu Vaccine status: Declined, Education has been provided regarding the  importance of this vaccine but patient still declined. Advised may receive this vaccine at local pharmacy or Health Dept. Aware to provide a copy of the vaccination record if obtained from local pharmacy or Health Dept. Verbalized acceptance and understanding.  Pneumococcal vaccine status: Declined,  Education has been provided regarding the importance of this vaccine but patient still declined. Advised may receive this vaccine at local pharmacy or Health Dept. Aware to provide a copy of the vaccination record if obtained from local pharmacy or Health Dept. Verbalized acceptance and understanding.   Covid-19 vaccine status: Declined, Education has been provided regarding the importance of this vaccine but patient still declined. Advised may receive this vaccine at local pharmacy or Health Dept.or vaccine clinic. Aware to provide a copy of the vaccination record if obtained from local pharmacy or Health Dept. Verbalized acceptance and understanding.  Qualifies for Shingles Vaccine? Yes   Zostavax completed No   Declined Shingrix Vaccine  Screening Tests Health Maintenance  Topic Date Due   DTaP/Tdap/Td (1 - Tdap) Never done   COVID-19 Vaccine (1 - 2024-25 season) Never done   Medicare Annual Wellness (AWV)  03/11/2023   Zoster Vaccines- Shingrix (1 of 2) 04/19/2023 (Originally [birth year]/1999)   INFLUENZA VACCINE  05/18/2023 (Originally 09/18/2022)   Pneumonia Vaccine 9+ Years old (1 of 2 - PCV) 01/19/2024 (Originally 09/15/1956)   Diabetic kidney evaluation - Urine ACR  05/08/2023   HEMOGLOBIN A1C  07/12/2023   OPHTHALMOLOGY EXAM  09/04/2023   Colonoscopy  12/01/2023   Diabetic kidney evaluation - eGFR measurement  01/12/2024   FOOT EXAM  01/19/2024   Hepatitis C Screening  Completed   HPV VACCINES  Aged Out    Health Maintenance  Health Maintenance Due  Topic Date Due   DTaP/Tdap/Td (1 - Tdap) Never done   COVID-19 Vaccine (1 - 2024-25 season) Never done   Medicare Annual Wellness (AWV)   03/11/2023    Colorectal cancer screening: Type of screening: Colonoscopy. Completed 12/01/2018. Repeat every 5 years  Lung Cancer Screening: (Low Dose CT Chest recommended if Age 6-80 years, 20 pack-year currently smoking OR have quit w/in 15years.) does not qualify.   Lung Cancer Screening Referral: na  Additional Screening:  Hepatitis C Screening: does not qualify; Completed   Vision Screening: Recommended annual ophthalmology exams for early detection of glaucoma and other disorders of the eye. Is the patient up to date with their annual eye exam?  Yes  Who is the provider  or what is the name of the office in which the patient attends annual eye exams? Patient sees Dr. Daisy Lazar w/ [facility] office.  If pt is not established with a provider, would they like to be referred to a provider to establish care? No .   Dental Screening: Recommended annual dental exams for proper oral hygiene  Diabetic Foot Exam: Diabetic Foot Exam: Completed 01/19/2023  Community Resource Referral / Chronic Care Management: CRR required this visit?  No   CCM required this visit?  No     Plan:     I have personally reviewed and noted the following in the patient's chart:   Medical and social history Use of alcohol, tobacco or illicit drugs  Current medications and supplements including opioid prescriptions. Patient is not currently taking opioid prescriptions. Functional ability and status Nutritional status Physical activity Advanced directives List of other physicians Hospitalizations, surgeries, and ER visits in previous 12 months Vitals Screenings to include cognitive, depression, and falls Referrals and appointments  In addition, I have reviewed and discussed with patient certain preventive protocols, quality metrics, and best practice recommendations. A written personalized care plan for preventive services as well as general preventive health recommendations were  provided to patient.     Jordan Hawks Vasilios Ottaway, CMA   03/18/2023   After Visit Summary: (Mail) Due to this being a telephonic visit, the after visit summary with patients personalized plan was offered to patient via mail

## 2023-03-18 NOTE — Patient Instructions (Signed)
Albert Morris , Thank you for taking time to come for your Medicare Wellness Visit. I appreciate your ongoing commitment to your health goals. Please review the following plan we discussed and let me know if I can assist you in the future.   Referrals/Orders/Follow-Ups/Clinician Recommendations:  Next Medicare Annual Wellness Visit:March 22, 2024 at 8:40 am telephone visit.   Vaccinations: declines all Influenza vaccine: recommend every Fall Pneumococcal vaccine: recommend once per lifetime Prevnar-20 Tdap vaccine: recommend every 10 years Shingles vaccine: recommend Shingrix which is 2 doses 2-6 months apart and over 90% effective     Covid-19: recommend 2 doses one month apart with a booster 6 months later   This is a list of the screening recommended for you and due dates:  Health Maintenance  Topic Date Due   DTaP/Tdap/Td vaccine (1 - Tdap) Never done   COVID-19 Vaccine (1 - 2024-25 season) Never done   Zoster (Shingles) Vaccine (1 of 2) 04/19/2023*   Flu Shot  05/18/2023*   Pneumonia Vaccine (1 of 2 - PCV) 01/19/2024*   Yearly kidney health urinalysis for diabetes  05/08/2023   Hemoglobin A1C  07/12/2023   Eye exam for diabetics  09/04/2023   Colon Cancer Screening  12/01/2023   Yearly kidney function blood test for diabetes  01/12/2024   Complete foot exam   01/19/2024   Medicare Annual Wellness Visit  03/17/2024   Hepatitis C Screening  Completed   HPV Vaccine  Aged Out  *Topic was postponed. The date shown is not the original due date.    Advanced directives: (ACP Link)Information on Advanced Care Planning can be found at [facility] Advance Health Care Directives Advance Health Care Directives ([email or web address]   Next Medicare Annual Wellness Visit scheduled for next year: yes  Preventive Care 65 Years and Older, Male Preventive care refers to lifestyle choices and visits with your health care provider that can promote health and wellness. Preventive  care visits are also called wellness exams. What can I expect for my preventive care visit? Counseling During your preventive care visit, your health care provider may ask about your: Medical history, including: Past medical problems. Family medical history. History of falls. Current health, including: Emotional well-being. Home life and relationship well-being. Sexual activity. Memory and ability to understand (cognition). Lifestyle, including: Alcohol, nicotine or tobacco, and drug use. Access to firearms. Diet, exercise, and sleep habits. Work and work Astronomer. Sunscreen use. Safety issues such as seatbelt and bike helmet use. Physical exam Your health care provider will check your: Height and weight. These may be used to calculate your BMI (body mass index). BMI is a measurement that tells if you are at a healthy weight. Waist circumference. This measures the distance around your waistline. This measurement also tells if you are at a healthy weight and may help predict your risk of certain diseases, such as type 2 diabetes and high blood pressure. Heart rate and blood pressure. Body temperature. Skin for abnormal spots. What immunizations do I need?  Vaccines are usually given at various ages, according to a schedule. Your health care provider will recommend vaccines for you based on your age, medical history, and lifestyle or other factors, such as travel or where you work. What tests do I need? Screening Your health care provider may recommend screening tests for certain conditions. This may include: Lipid and cholesterol levels. Diabetes screening. This is done by checking your blood sugar (glucose) after you have not eaten for a while (  fasting). Hepatitis C test. Hepatitis B test. HIV (human immunodeficiency virus) test. STI (sexually transmitted infection) testing, if you are at risk. Lung cancer screening. Colorectal cancer screening. Prostate cancer  screening. Abdominal aortic aneurysm (AAA) screening. You may need this if you are a current or former smoker. Talk with your health care provider about your test results, treatment options, and if necessary, the need for more tests. Follow these instructions at home: Eating and drinking  Eat a diet that includes fresh fruits and vegetables, whole grains, lean protein, and low-fat dairy products. Limit your intake of foods with high amounts of sugar, saturated fats, and salt. Take vitamin and mineral supplements as recommended by your health care provider. Do not drink alcohol if your health care provider tells you not to drink. If you drink alcohol: Limit how much you have to 0-2 drinks a day. Know how much alcohol is in your drink. In the [place] one drink equals one 12 oz bottle of beer (355 mL), one 5 oz glass of wine (148 mL), or one 1 oz glass of hard liquor (44 mL). Lifestyle Brush your teeth every morning and night with fluoride toothpaste. Floss one time each day. Exercise for at least 30 minutes 5 or more days each week. Do not use any products that contain nicotine or tobacco. These products include cigarettes, chewing tobacco, and vaping devices, such as e-cigarettes. If you need help quitting, ask your health care provider. Do not use drugs. If you are sexually active, practice safe sex. Use a condom or other form of protection to prevent STIs. Take aspirin only as told by your health care provider. Make sure that you understand how much to take and what form to take. Work with your health care provider to find out whether it is safe and beneficial for you to take aspirin daily. Ask your health care provider if you need to take a cholesterol-lowering medicine (statin). Find healthy ways to manage stress, such as: Meditation, yoga, or listening to music. Journaling. Talking to a trusted person. Spending time with friends and family. Safety Always wear your seat belt while driving  or riding in a vehicle. Do not drive: If you have been drinking alcohol. Do not ride with someone who has been drinking. When you are tired or distracted. While texting. If you have been using any mind-altering substances or drugs. Wear a helmet and other protective equipment during sports activities. If you have firearms in your house, make sure you follow all gun safety procedures. Minimize exposure to UV radiation to reduce your risk of skin cancer. What's next? Visit your health care provider once a year for an annual wellness visit. Ask your health care provider how often you should have your eyes and teeth checked. Stay up to date on all vaccines. This information is not intended to replace advice given to you by your health care provider. Make sure you discuss any questions you have with your health care provider. Document Revised: 08/01/2020 Document Reviewed: 08/01/2020 Elsevier Patient Education  2024 [employer].  Understanding Your Risk for Falls Millions of people have serious injuries from falls each year. It is important to understand your risk of falling. Talk with your health care provider about your risk and what you can do to lower it. If you do have a serious fall, make sure to tell your provider. Falling once raises your risk of falling again. How can falls affect me? Serious injuries from falls are common. These include: Broken  bones, such as hip fractures. Head injuries, such as traumatic brain injuries (TBI) or concussions. A fear of falling can cause you to avoid activities and stay at home. This can make your muscles weaker and raise your risk for a fall. What can increase my risk? There are a number of risk factors that increase your risk for falling. The more risk factors you have, the higher your risk of falling. Serious injuries from a fall happen most often to people who are older than 74 years old. Teenagers and young adults ages 82-29 are also at higher  risk. Common risk factors include: Weakness in the lower body. Being generally weak or confused due to long-term (chronic) illness. Dizziness or balance problems. Poor vision. Medicines that cause dizziness or drowsiness. These may include: Medicines for your blood pressure, heart, anxiety, insomnia, or swelling (edema). Pain medicines. Muscle relaxants. Other risk factors include: Drinking alcohol. Having had a fall in the past. Having foot pain or wearing improper footwear. Working at a dangerous job. Having any of the following in your home: Tripping hazards, such as floor clutter or loose rugs. Poor lighting. Pets. Having dementia or memory loss. What actions can I take to lower my risk of falling?     Physical activity Stay physically fit. Do strength and balance exercises. Consider taking a regular class to build strength and balance. Yoga and tai chi are good options. Vision Have your eyes checked every year and your prescription for glasses or contacts updated as needed. Shoes and walking aids Wear non-skid shoes. Wear shoes that have rubber soles and low heels. Do not wear high heels. Do not walk around the house in socks or slippers. Use a cane or walker as told by your provider. Home safety Attach secure railings on both sides of your stairs. Install grab bars for your bathtub, shower, and toilet. Use a non-skid mat in your bathtub or shower. Attach bath mats securely with double-sided, non-slip rug tape. Use good lighting in all rooms. Keep a flashlight near your bed. Make sure there is a clear path from your bed to the bathroom. Use night-lights. Do not use throw rugs. Make sure all carpeting is taped or tacked down securely. Remove all clutter from walkways and stairways, including extension cords. Repair uneven or broken steps and floors. Avoid walking on icy or slippery surfaces. Walk on the grass instead of on icy or slick sidewalks. Use ice melter to get  rid of ice on walkways in the winter. Use a cordless phone. Questions to ask your health care provider Can you help me check my risk for a fall? Do any of my medicines make me more likely to fall? Should I take a vitamin D supplement? What exercises can I do to improve my strength and balance? Should I make an appointment to have my vision checked? Do I need a bone density test to check for weak bones (osteoporosis)? Would it help to use a cane or a walker? Where to find more information Centers for Disease Control and Prevention, STEAD[employer]: TonerPromos.no Community-Based Fall Prevention Programs: TonerPromos.no [employer] on Aging: BaseRingTones.pl Contact a health care provider if: You fall at home. You are afraid of falling at home. You feel weak, drowsy, or dizzy. This information is not intended to replace advice given to you by your health care provider. Make sure you discuss any questions you have with your health care provider. Document Revised: 10/07/2021 Document Reviewed: 10/07/2021 Elsevier Patient Education  2024 [employer].

## 2023-03-23 ENCOUNTER — Encounter: Payer: Self-pay | Admitting: Internal Medicine

## 2023-03-23 ENCOUNTER — Ambulatory Visit (INDEPENDENT_AMBULATORY_CARE_PROVIDER_SITE_OTHER): Payer: Medicare HMO | Admitting: Internal Medicine

## 2023-03-23 VITALS — BP 146/84 | HR 67 | Ht 75.0 in | Wt 199.4 lb

## 2023-03-23 DIAGNOSIS — I739 Peripheral vascular disease, unspecified: Secondary | ICD-10-CM

## 2023-03-23 DIAGNOSIS — E1169 Type 2 diabetes mellitus with other specified complication: Secondary | ICD-10-CM | POA: Diagnosis not present

## 2023-03-23 DIAGNOSIS — N1831 Chronic kidney disease, stage 3a: Secondary | ICD-10-CM

## 2023-03-23 DIAGNOSIS — I25118 Atherosclerotic heart disease of native coronary artery with other forms of angina pectoris: Secondary | ICD-10-CM | POA: Diagnosis not present

## 2023-03-23 DIAGNOSIS — I10 Essential (primary) hypertension: Secondary | ICD-10-CM

## 2023-03-23 DIAGNOSIS — E1159 Type 2 diabetes mellitus with other circulatory complications: Secondary | ICD-10-CM

## 2023-03-23 DIAGNOSIS — I5032 Chronic diastolic (congestive) heart failure: Secondary | ICD-10-CM

## 2023-03-23 MED ORDER — HYDRALAZINE HCL 50 MG PO TABS: 50.0000 mg | ORAL_TABLET | Freq: Two times a day (BID) | ORAL | 2 refills | Status: DC

## 2023-03-23 NOTE — Assessment & Plan Note (Addendum)
BP Readings from Last 1 Encounters:  03/23/23 138/84   Uncontrolled now On lisinopril 40 mg once daily, Coreg 6.25 mg BID and Hydralazine 25 mg BID Avoid increasing dose of Coreg as his HR is in 60s currently Increased dose of Hydralazine to 50 mg BID Due to recurrent episodes of gout, discontinued HCTZ Counseled for compliance with the medications Advised DASH diet and moderate exercise/walking as tolerated

## 2023-03-23 NOTE — Progress Notes (Signed)
Established Patient Office Visit  Subjective:  Patient ID: Albert Morris, male    DOB: Sep 10, 1950  Age: 73 y.o. 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  HTN: BP was elevated today, but better than prior. Takes lisinopril 40 mg once daily, Coreg 6.25 mg twice daily and hydralazine 25 mg twice daily regularly. He reports his BP was 140s/70s at home this morning before breakfast.  Patient denies headache, dizziness, chest pain, dyspnea or palpitations.   HLD: He has been taking Lipitor now.   Type 2 DM: His HbA1C is stable at 6.6. Diet controlled.  He was given [place] considering history of HFpEF, CAD and PAD, but he did not take it considering its side effects.  He was counseled about its potential benefits and his low risk of side effects in the last visit, but does not prefer to take medicine for it. Denies any polyuria or polyphagia.   Past Medical History:  Diagnosis Date   Aortic insufficiency    mild AI with AVSC on echo 10/2022   Cerebellar stroke (HCC) 06/24/2016   Admitted with dizziness and "falling to the left" 06/24/16. Work up revealed multifocal acute ischemia within the right cerebellar hemisphere with occlusion versus severe stenosis of the right PICA and extensive, multifocal intracranial atherosclerosis with moderate stenoses of multiple distal MCA branches.   Colon polyps    CVA (cerebral vascular accident) (HCC) 06/21/2016   Diabetes mellitus without complication (HCC) 11/11/2018   Hyperlipidemia    Hypertension    Myocardial infarction (HCC)    Pneumonia    Substance abuse (HCC)    hx of EtOH and MJ    Past Surgical History:  Procedure Laterality Date   CAROTID ENDARTERECTOMY Left    COLON SURGERY  [phone]   growth  removed, Laparoscopic Vs. open Left hemicolectomy Dr. Liliane Shi 09-25-17   COLONOSCOPY N/A 06/26/2017   Procedure: COLONOSCOPY;  Surgeon: Malissa Hippo, MD;  Location: [facility];  Service: Endoscopy;  Laterality: N/A;  730   COLONOSCOPY N/A 12/01/2018   Procedure: COLONOSCOPY;  Surgeon: Malissa Hippo, MD;  Location: [facility];  Service: Endoscopy;  Laterality: N/A;  930   COLONOSCOPY WITH PROPOFOL N/A 10/07/2017   Procedure: COLONOSCOPY WITH PROPOFOL WITH TATTOO;  Surgeon: Andria Meuse, MD;  Location: [facility];  Service: General;  Laterality: N/A;   ENDARTERECTOMY Left 08/01/2016   Procedure: ENDARTERECTOMY LEFT CAROTID;  Surgeon: Chuck Hint, MD;  Location: [facility] OR;  Service: Vascular;  Laterality: Left;   FLEXIBLE SIGMOIDOSCOPY N/A 10/08/2017   Procedure: FLEXIBLE SIGMOIDOSCOPY;  Surgeon: Andria Meuse, MD;  Location: [facility] ORS;  Service: General;  Laterality: N/A;   LAPAROSCOPIC RIGHT HEMI COLECTOMY Left 10/08/2017   Procedure: LAPAROSCOPIC VERSES OPEN LEFT HEMI COLECTOMY ERAS PATHWAY;  Surgeon: Andria Meuse, MD;  Location: [facility];  Service: General;  Laterality: Left;   PATCH ANGIOPLASTY Left 08/01/2016   Procedure: PATCH ANGIOPLASTY LEFT CAROTID ARTERY USING ZENOSURE BIOLOGIC PATCH;  Surgeon: Chuck Hint, MD;  Location: [facility] OR;  Service: Vascular;  Laterality: Left;   POLYPECTOMY  12/01/2018   Procedure: POLYPECTOMY;  Surgeon: Malissa Hippo, MD;  Location: [facility];  Service: Endoscopy;;  SUBMUCOSAL INJECTION  10/07/2017   Procedure: SUBMUCOSAL INJECTION;  Surgeon: Andria Meuse, MD;  Location: [facility];  Service: General;;   TONSILLECTOMY      Family History  Problem Relation Age of Onset   Cancer Mother    Breast cancer Mother        lived to be 41   CVA Mother    CAD Mother    Arthritis Mother    Hypertension Mother    Other Father        gunshot wound   Early death Father        GSW   Cancer Brother         Stomach Cancer   Cancer Brother        Lung Cancer   Stroke Maternal Aunt    Alcohol abuse Maternal Uncle    Stroke Maternal Grandfather    Colon cancer Neg Hx     Social History   Socioeconomic History   Marital status: Divorced    Spouse name: Not on file   Number of children: 2   Years of education: 12   Highest education level: Not on file  Occupational History   Occupation: disabled Cytogeneticist x8 years; 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[place], [place], [place], [place], [place], [place], [place], [place]    Comment: alcoholic  Tobacco Use   Smoking status: Former    Current packs/day: 0.00    Average packs/day: 0.5 packs/day for 29.0 years (14.5 ttl pk-yrs)    Types: Cigarettes    Start date: 78    Quit date: 2006    Years since quitting: 19.1   Smokeless tobacco: Never  Vaping Use   Vaping status: Never Used  Substance and Sexual Activity   Alcohol use: Not Currently    Comment: quit 2006   Drug use: Not Currently    Comment: remote h/o heavy marijuana use, also used cocaine and others   Sexual activity: Not Currently  Other Topics Concern   Not on file  Social History Narrative   Divorced   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Electronics engineer for 8 years, EMT certified      Lives alone   Likes to walk      2 sons -in [place] and [place]   Social Drivers of Health   Financial Resource Strain: Low Risk  (03/10/2022)   Overall Financial Resource Strain (CARDIA)    Difficulty of Paying Living Expenses: Not hard at all  Food Insecurity: No Food Insecurity (03/10/2022)   Hunger Vital Sign    Worried About Running Out of Food in the Last Year: Never true    Ran Out of Food in the Last Year: Never true  Transportation Needs: No Transportation Needs (03/10/2022)   PRAPARE - Administrator, Civil Service (Medical): No    Lack of Transportation (Non-Medical): No  Physical Activity: Sufficiently Active (03/10/2022)   Exercise Vital Sign    Days of Exercise per Week: 7 days    Minutes of Exercise per Session: 30 min   Stress: No Stress Concern Present (03/10/2022)   [employer] of Occupational Health - Occupational Stress Questionnaire    Feeling of Stress : Not at all  Social Connections: Moderately Isolated (03/10/2022)   Social Connection and Isolation Panel [NHANES]    Frequency of Communication with Friends and Family: More than three times a week    Frequency of Social Gatherings with Friends and Family: More than three times a week    Attends Religious Services: More than  4 times per year    Active Member of Clubs or Organizations: No    Attends Banker Meetings: Never    Marital Status: Divorced  Catering manager Violence: Not At Risk (03/10/2022)   Humiliation, Afraid, Rape, and Kick questionnaire    Fear of Current or Ex-Partner: No    Emotionally Abused: No    Physically Abused: No    Sexually Abused: No    Outpatient Medications Prior to Visit  Medication Sig Dispense Refill   aspirin EC 81 MG tablet Take 1 tablet (81 mg total) by mouth daily. 90 tablet 3   atorvastatin (LIPITOR) 80 MG tablet TAKE 1 TABLET(80 MG) BY MOUTH DAILY 90 tablet 3   carvedilol (COREG) 6.25 MG tablet TAKE 1 TABLET(6.25 MG) BY MOUTH TWICE DAILY WITH A MEAL 180 tablet 1   Cholecalciferol (VITAMIN D) 50 MCG (2000 UT) CAPS Take 1 capsule by mouth daily.     clopidogrel (PLAVIX) 75 MG tablet TAKE 1 TABLET(75 MG) BY MOUTH DAILY 90 tablet 1   colchicine 0.6 MG tablet Take 2 tablets at once, then take 1 tablet 1 hour later. Take 1 tablet once daily from Day 2. 20 tablet 0   lisinopril (ZESTRIL) 40 MG tablet Take 1 tablet (40 mg total) by mouth daily. 90 tablet 1   Vitamin D, Ergocalciferol, 50 MCG (2000 UT) CAPS Take 1 capsule by mouth daily.     hydrALAZINE (APRESOLINE) 25 MG tablet Take 1 tablet (25 mg total) by mouth 2 (two) times daily. 60 tablet 3   No facility-administered medications prior to visit.    Allergies  Allergen Reactions   Blood-Group Specific Substance     No whole blood  products   Diclofenac Sodium-Menthol Gel [Diclofenac Sodium-Menthol Crm]     Shortness of breath   Prednisone     Elevates blood pressure    ROS Review of Systems  Constitutional:  Negative for chills and fever.  HENT:  Negative for congestion and sore throat.   Eyes:  Negative for pain and discharge.  Respiratory:  Negative for cough and shortness of breath.   Cardiovascular:  Negative for chest pain and palpitations.  Gastrointestinal:  Negative for constipation, diarrhea, nausea and vomiting.  Endocrine: Negative for polydipsia and polyuria.  Genitourinary:  Negative for dysuria and hematuria.  Musculoskeletal:  Negative for neck pain and neck stiffness.  Skin:  Negative for rash.  Neurological:  Negative for dizziness, weakness, numbness and headaches.  Psychiatric/Behavioral:  Negative for agitation and behavioral problems.       Objective:    Physical Exam Vitals reviewed.  Constitutional:      General: He is not in acute distress.    Appearance: He is not diaphoretic.  HENT:     Head: Normocephalic and atraumatic.     Nose: Nose normal.     Mouth/Throat:     Mouth: Mucous membranes are moist.  Eyes:     General: No scleral icterus.    Extraocular Movements: Extraocular movements intact.  Cardiovascular:     Rate and Rhythm: Normal rate and regular rhythm.     Heart sounds: Normal heart sounds. No murmur heard. Pulmonary:     Breath sounds: Normal breath sounds. No wheezing or rales.  Musculoskeletal:     Cervical back: Neck supple. No tenderness.     Right lower leg: No edema.     Left lower leg: No edema.  Skin:    General: Skin is warm.  Findings: No rash.  Neurological:     General: No focal deficit present.     Mental Status: He is alert and oriented to person, place, and time.     Sensory: No sensory deficit.     Motor: No weakness.  Psychiatric:        Mood and Affect: Mood normal.        Behavior: Behavior normal.     BP (!) 146/84    Pulse 67   Ht 6\' 3"  (1.905 m)   Wt 199 lb 6.4 oz (90.4 kg)   SpO2 95%   BMI 24.92 kg/m  Wt Readings from Last 3 Encounters:  03/23/23 199 lb 6.4 oz (90.4 kg)  03/18/23 205 lb (93 kg)  01/19/23 201 lb 6.4 oz (91.4 kg)    Lab Results  Component Value Date   TSH 1.490 09/04/2021   Lab Results  Component Value Date   WBC 5.5 09/08/2022   HGB 12.4 (L) 09/08/2022   HCT 36.6 (L) 09/08/2022   MCV 86 09/08/2022   PLT 286 09/08/2022   Lab Results  Component Value Date   NA 137 01/12/2023   K 4.9 01/12/2023   CO2 25 01/12/2023   GLUCOSE 105 (H) 01/12/2023   BUN 15 01/12/2023   CREATININE 1.29 (H) 01/12/2023   BILITOT 0.5 01/12/2023   ALKPHOS 91 01/12/2023   AST 29 01/12/2023   ALT 26 01/12/2023   PROT 7.1 01/12/2023   ALBUMIN 4.4 01/12/2023   CALCIUM 9.8 01/12/2023   ANIONGAP 11 10/12/2017   EGFR 59 (L) 01/12/2023   Lab Results  Component Value Date   CHOL 140 01/12/2023   Lab Results  Component Value Date   HDL 47 01/12/2023   Lab Results  Component Value Date   LDLCALC 83 01/12/2023   Lab Results  Component Value Date   TRIG 44 01/12/2023   Lab Results  Component Value Date   CHOLHDL 3.0 01/12/2023   Lab Results  Component Value Date   HGBA1C 6.6 (H) 01/12/2023      Assessment & Plan:   Problem List Items Addressed This Visit       Cardiovascular and Mediastinum   Essential hypertension - Primary   BP Readings from Last 1 Encounters:  03/23/23 138/84   Uncontrolled now On lisinopril 40 mg once daily, Coreg 6.25 mg BID and Hydralazine 25 mg BID Avoid increasing dose of Coreg as his HR is in 60s currently Increased dose of Hydralazine to 50 mg BID Due to recurrent episodes of gout, discontinued HCTZ Counseled for compliance with the medications Advised DASH diet and moderate exercise/walking as tolerated      Relevant Medications   hydrALAZINE (APRESOLINE) 50 MG tablet   Other Relevant Orders   CMP14+EGFR   CBC with Differential/Platelet    Coronary artery disease of native artery of native heart with stable angina pectoris (HCC)   Followed by cardiology On aspirin, statin and beta-blocker      Relevant Medications   hydrALAZINE (APRESOLINE) 50 MG tablet   Chronic heart failure with preserved ejection fraction (HCC)   Last Echo reviewed On ACEi and B-blocker Had added Farxiga, he did not take it as he was concerned about its side effects, after counseling he still denied to start taking it      Relevant Medications   hydrALAZINE (APRESOLINE) 50 MG tablet   PAD (peripheral artery disease) (HCC)   Has blackish discoloration of the toes chronically, but warm extremities Has history of  CAD, CVA and carotid artery stenosis [place] ABI screening test showed ABI index of 0.61 on right leg, likely due to right superficial femoral artery occlusion, had vascular surgery evaluation-recommended conservative treatment Currently on DAPT and statin      Relevant Medications   hydrALAZINE (APRESOLINE) 50 MG tablet     Endocrine   Type 2 diabetes mellitus with other specified complication (HCC)   Lab Results  Component Value Date   HGBA1C 6.6 (H) 01/12/2023   Diet controlled Associated with HTN, HLD, PAD, CKD and CHF Had added Farxiga for HFpEF and CKD, but did not take it despite counseling Advised to follow diabetic diet On statin and ACEi F/u CMP and lipid panel Diabetic eye exam: Advised to follow up with Ophthalmology for diabetic eye exam        Genitourinary   Stage 3a chronic kidney disease (HCC)   BMP reviewed, GFR stays around 55 Likely from HTN, DM and age related decline On lisinopril for HTN Had added Farxiga for CKD and HFpEF, but did not take it due to concern for side effects Avoid nephrotoxic agents Advised to improve fluid intake to at least 64 ounces of fluid in a day      Relevant Orders   CMP14+EGFR   CBC with Differential/Platelet     Meds ordered this encounter  Medications   hydrALAZINE  (APRESOLINE) 50 MG tablet    Sig: Take 1 tablet (50 mg total) by mouth 2 (two) times daily.    Dispense:  60 tablet    Refill:  2    Dose change - 03/23/23    Follow-up: Return in about 2 months (around 05/21/2023) for HTN.    Anabel Halon, MD

## 2023-03-23 NOTE — Assessment & Plan Note (Signed)
Last Echo reviewed On ACEi and B-blocker Had added Farxiga, he did not take it as he was concerned about its side effects, after counseling he still denied to start taking it

## 2023-03-23 NOTE — Assessment & Plan Note (Signed)
Followed by cardiology On aspirin, statin and beta-blocker

## 2023-03-23 NOTE — Patient Instructions (Addendum)
Please contact Vein and Vascular surgery office at [phone].  Please start taking Hydralazine 50 mg twice instead of 25 mg. Continue taking other medications as prescribed.  Please continue to follow low salt diet and perform moderate exercise/walking at least 150 mins/week.  Please get blood tests done before the next visit.

## 2023-03-23 NOTE — Assessment & Plan Note (Signed)
 BMP reviewed, GFR stays around 55 Likely from HTN, DM and age related decline On lisinopril for HTN Had added Farxiga for CKD and HFpEF, but did not take it due to concern for side effects Avoid nephrotoxic agents Advised to improve fluid intake to at least 64 ounces of fluid in a day

## 2023-03-23 NOTE — Assessment & Plan Note (Addendum)
Lab Results  Component Value Date   HGBA1C 6.6 (H) 01/12/2023   Diet controlled Associated with HTN, HLD, PAD, CKD and CHF Had added Farxiga for HFpEF and CKD, but did not take it despite counseling Advised to follow diabetic diet On statin and ACEi F/u CMP and lipid panel Diabetic eye exam: Advised to follow up with Ophthalmology for diabetic eye exam

## 2023-03-23 NOTE — Assessment & Plan Note (Signed)
Has blackish discoloration of the toes chronically, but warm extremities Has history of CAD, CVA and carotid artery stenosis [place] ABI screening test showed ABI index of 0.61 on right leg, likely due to right superficial femoral artery occlusion, had vascular surgery evaluation-recommended conservative treatment Currently on DAPT and statin

## 2023-03-24 ENCOUNTER — Ambulatory Visit: Payer: Medicare HMO | Attending: Student | Admitting: Student

## 2023-03-24 ENCOUNTER — Encounter: Payer: Self-pay | Admitting: Student

## 2023-03-24 VITALS — BP 128/78 | HR 68 | Ht 75.0 in | Wt 199.6 lb

## 2023-03-24 DIAGNOSIS — Z8673 Personal history of transient ischemic attack (TIA), and cerebral infarction without residual deficits: Secondary | ICD-10-CM | POA: Diagnosis not present

## 2023-03-24 DIAGNOSIS — I5032 Chronic diastolic (congestive) heart failure: Secondary | ICD-10-CM

## 2023-03-24 DIAGNOSIS — E785 Hyperlipidemia, unspecified: Secondary | ICD-10-CM | POA: Diagnosis not present

## 2023-03-24 DIAGNOSIS — I6523 Occlusion and stenosis of bilateral carotid arteries: Secondary | ICD-10-CM

## 2023-03-24 DIAGNOSIS — I10 Essential (primary) hypertension: Secondary | ICD-10-CM

## 2023-03-24 NOTE — Patient Instructions (Signed)
Medication Instructions:  Your physician recommends that you continue on your current medications as directed. Please refer to the Current Medication list given to you today.  *If you need a refill on your cardiac medications before your next appointment, please call your pharmacy*   Lab Work: None If you have labs (blood work) drawn today and your tests are completely normal, you will receive your results only by: MyChart Message (if you have MyChart) OR A paper copy in the mail If you have any lab test that is abnormal or we need to change your treatment, we will call you to review the results.   Testing/Procedures: Lung Cancer CT   Follow-Up: At [facility], you and your health needs are our priority.  As part of our continuing mission to provide you with exceptional heart care, we have created designated Provider Care Teams.  These Care Teams include your primary Cardiologist (physician) and Advanced Practice Providers (APPs -  Physician Assistants and Nurse Practitioners) who all work together to provide you with the care you need, when you need it.  We recommend signing up for the patient portal called "MyChart".  Sign up information is provided on this After Visit Summary.  MyChart is used to connect with patients for Virtual Visits (Telemedicine).  Patients are able to view lab/test results, encounter notes, upcoming appointments, etc.  Non-urgent messages can be sent to your provider as well.   To learn more about what you can do with MyChart, go to [email or web address].    Your next appointment:   1 year(s)  Provider:   You may see Dietrich Pates, MD or one of the following Advanced Practice Providers on your designated Care Team:   Randall An, PA-C  Jacolyn Reedy, [place]     Other Instructions

## 2023-03-24 NOTE — Progress Notes (Signed)
 Cardiology Office Note    Date:  03/24/2023  ID:  MARKIAN GLOCKNER, DOB 1950-09-07, MRN [id number] Cardiologist: Vina Gull, MD    History of Present Illness:    Albert Morris is a 73 y.o. male with past medical history of HFimpEF (EF 45 to 50% by echocardiogram in 2018 with NST showing scar and no ischemia, EF normalized to 65-70% by echo in 05/2019), carotid artery stenosis  (s/p left CEA in 07/2016), prior CVA, HTN, HLD and Stage 3 CKD who presents to the office today for routine follow-up.  He was last examined by Dr. Gull in 03/2022 and denied any recent anginal symptoms at that time. Was walking for 1.5 miles a few times each week. No changes were made to his cardiac medications and it was recommend that he have a follow-up echocardiogram later in the year. This was performed in 10/2022 and showed a preserved EF of 60 to 65% with moderate LVH, mild AI and normal RV function. He did have mild dilatation of the aortic root at 40 mm.  In talking with the patient today, he reports overall doing well since his last office visit. He continues to walk for over a mile at a local park most days of the week when weather permits and denies any recent chest pain or dyspnea on exertion with this. No recent claudication either. Reports his breathing has been stable at night with no specific orthopnea, PND or recent lower extremity edema. Blood pressure has overall been well-controlled when checked at home and was initially recorded at 160/94 during today's visit but improved to 128/78 on recheck. He was interested in a low-dose lung cancer screening CT but upon our front office checking with Radiology, this is not covered given that he quit smoking in 2006.  Studies Reviewed:   EKG: EKG is ordered today and demonstrates:   EKG Interpretation Date/Time:  Tuesday March 24 2023 15:17:44 EST Ventricular Rate:  68 PR Interval:  176 QRS Duration:  110 QT Interval:  376 QTC Calculation: 399 R  Axis:   64  Text Interpretation: Normal sinus rhythm Nonspecific T wave abnormality  along lateral leads and noted on prior tracings as well. Reconfirmed by Johnson Grate ([id number]) on 03/24/2023 4:22:10 PM       Echocardiogram: 10/2022 IMPRESSIONS     1. Left ventricular ejection fraction, by estimation, is 60 to 65%. The  left ventricle has normal function. Left ventricular endocardial border  not optimally defined to evaluate regional wall motion. There is moderate  left ventricular hypertrophy. Left  ventricular diastolic parameters are indeterminate.   2. Right ventricular systolic function is normal. The right ventricular  size is mildly enlarged. Tricuspid regurgitation signal is inadequate for  assessing PA pressure.   3. The mitral valve is normal in structure. No evidence of mitral valve  regurgitation. No evidence of mitral stenosis.   4. The aortic valve has an indeterminant number of cusps. There is  moderate calcification of the aortic valve. There is moderate thickening  of the aortic valve. Aortic valve regurgitation is mild. No aortic  stenosis is present.   5. Aortic dilatation noted. There is mild dilatation of the aortic root,  measuring 40 mm.   6. The inferior vena cava is normal in size with greater than 50%  respiratory variability, suggesting right atrial pressure of 3 mmHg.    Physical Exam:   VS:  BP 128/78   Pulse 68   Ht 6' 3 (1.905 m)  Wt 199 lb 9.6 oz (90.5 kg)   SpO2 97%   BMI 24.95 kg/m    Wt Readings from Last 3 Encounters:  03/24/23 199 lb 9.6 oz (90.5 kg)  03/23/23 199 lb 6.4 oz (90.4 kg)  03/18/23 205 lb (93 kg)     GEN: Well nourished, well developed male appearing in no acute distress NECK: No JVD; No carotid bruits CARDIAC: RRR, no murmurs, rubs, gallops RESPIRATORY:  Clear to auscultation without rales, wheezing or rhonchi  ABDOMEN: Appears non-distended. No obvious abdominal masses. EXTREMITIES: No clubbing or cyanosis. No  pitting edema.  Distal pedal pulses are 2+ bilaterally.   Assessment and Plan:   1. 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 He overall remains active and denies any recent anginal symptoms. Continue current medical therapy with Coreg  6.25 mg twice daily, Hydralazine  50 mg twice daily and Lisinopril  40 mg daily.  2. HTN - Blood pressure was initially elevated but significantly improved to 128/78 on recheck. This has overall been well-controlled when checked at home as well. Continue current medical therapy with Coreg  6.25 mg twice daily, Hydralazine  50 mg twice daily and Lisinopril  40 mg daily.  3. HLD - LDL was at 83 when checked in 12/2022. He remains on Atorvastatin  80 mg daily and if LDL remains above goal, would recommend switching Atorvastatin  to Crestor 40 mg daily or adding Zetia  10 mg daily.    4. History of CVA  - He has remained on ASA and Plavix . Defer continuation of Plavix  75 mg daily to his PCP or Neurologist given the timeframe since his event.  5.  PAD  - He is followed by Vascular Surgery and has known superficial femoral artery occlusive disease and medical management has been recommended given no recent claudication symptoms. He also has a history of carotid artery stenosis with prior left CEA in 2018. He is scheduled for follow-up carotid dopplers in 04/2023. Remains on ASA, Plavix  and statin therapy.     Signed, Laymon CHRISTELLA Qua, PA-C

## 2023-03-27 ENCOUNTER — Other Ambulatory Visit: Payer: Self-pay | Admitting: Internal Medicine

## 2023-03-27 DIAGNOSIS — M10061 Idiopathic gout, right knee: Secondary | ICD-10-CM

## 2023-04-21 ENCOUNTER — Other Ambulatory Visit: Payer: Self-pay

## 2023-04-21 DIAGNOSIS — I6523 Occlusion and stenosis of bilateral carotid arteries: Secondary | ICD-10-CM

## 2023-04-23 ENCOUNTER — Encounter: Payer: Self-pay | Admitting: Podiatry

## 2023-04-23 ENCOUNTER — Ambulatory Visit: Payer: Medicare HMO | Admitting: Podiatry

## 2023-04-23 VITALS — Ht 75.0 in | Wt 199.0 lb

## 2023-04-23 DIAGNOSIS — E119 Type 2 diabetes mellitus without complications: Secondary | ICD-10-CM

## 2023-04-23 DIAGNOSIS — M79675 Pain in left toe(s): Secondary | ICD-10-CM

## 2023-04-23 DIAGNOSIS — B351 Tinea unguium: Secondary | ICD-10-CM

## 2023-04-23 DIAGNOSIS — M79674 Pain in right toe(s): Secondary | ICD-10-CM | POA: Diagnosis not present

## 2023-04-23 NOTE — Progress Notes (Signed)
 This patient returns to my office for at risk foot care.  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  Patient is taking plavix.  This patient is unable to cut nails himself since the patient cannot reach his nails.These nails are painful walking and wearing shoes.  Patient says his vascular status is being evaluated.This patient presents for at risk foot care today.  General Appearance  Alert, conversant and in no acute stress.  Vascular  Dorsalis pedis and posterior tibial  pulses are weakly  palpable  bilaterally.  Capillary return is within normal limits  bilaterally. Temperature is within normal limits  bilaterally.  Neurologic  Senn-Weinstein monofilament wire test within normal limits/diminished   bilaterally. Muscle power within normal limits bilaterally.  Nails Thick disfigured discolored nails with subungual debris  from hallux to fifth toes bilaterally. No evidence of bacterial infection or drainage bilaterally.  Orthopedic  No limitations of motion  feet .  No crepitus or effusions noted.  No bony pathology or digital deformities noted. HAV  B/L.  Skin  normotropic skin with no porokeratosis noted bilaterally.  No signs of infections or ulcers noted.   Asymptomatic thickness of skin sub 5th met left foot.  Onychomycosis  Pain in right toes  Pain in left toes  Consent was obtained for treatment procedures.   Mechanical debridement of nails 1-5  bilaterally performed with a nail nipper.  Filed with dremel without incident.    Return office visit   3 months                  Told patient to return for periodic foot care and evaluation due to potential at risk complications.   Helane Gunther DPM

## 2023-04-24 ENCOUNTER — Ambulatory Visit: Payer: Medicare HMO

## 2023-04-24 ENCOUNTER — Encounter (HOSPITAL_COMMUNITY): Payer: Medicare HMO

## 2023-05-05 ENCOUNTER — Other Ambulatory Visit: Payer: Self-pay | Admitting: Internal Medicine

## 2023-05-05 DIAGNOSIS — I10 Essential (primary) hypertension: Secondary | ICD-10-CM

## 2023-05-12 ENCOUNTER — Ambulatory Visit: Payer: Medicare HMO

## 2023-05-12 ENCOUNTER — Ambulatory Visit (INDEPENDENT_AMBULATORY_CARE_PROVIDER_SITE_OTHER)

## 2023-05-12 ENCOUNTER — Ambulatory Visit (INDEPENDENT_AMBULATORY_CARE_PROVIDER_SITE_OTHER): Admitting: Physician Assistant

## 2023-05-12 VITALS — BP 136/73 | HR 63 | Temp 98.3°F | Ht 75.0 in | Wt 200.0 lb

## 2023-05-12 DIAGNOSIS — N1831 Chronic kidney disease, stage 3a: Secondary | ICD-10-CM | POA: Diagnosis not present

## 2023-05-12 DIAGNOSIS — I739 Peripheral vascular disease, unspecified: Secondary | ICD-10-CM | POA: Diagnosis not present

## 2023-05-12 DIAGNOSIS — I6523 Occlusion and stenosis of bilateral carotid arteries: Secondary | ICD-10-CM

## 2023-05-12 DIAGNOSIS — I10 Essential (primary) hypertension: Secondary | ICD-10-CM | POA: Diagnosis not present

## 2023-05-12 DIAGNOSIS — E1159 Type 2 diabetes mellitus with other circulatory complications: Secondary | ICD-10-CM | POA: Diagnosis not present

## 2023-05-12 NOTE — Progress Notes (Signed)
 Office Note     CC:  follow up Requesting Provider:  Anabel Halon, MD  HPI: Albert Morris is a 73 y.o. (1950/09/16) male who presents for surveillance of carotid artery stenosis.  He has a history of left carotid endarterectomy by Dr. Edilia Bo in 2018.  He was seen in June 2024 by Dr. Arbie Cookey.  He denies any diagnosis of CVA or TIA since last year.  He also denies any strokelike symptoms including slurring speech, changes in vision, or one-sided weakness.  He also denies any right arm claudication or drop attacks.  Previously he was also evaluated for PAD with a right ABI of 0.6.  He follows regularly with podiatry every 3 months for routine nail care.  He has occasional right calf tightness with ambulation however the symptoms are tolerable.  He denies any rest pain or tissue loss of bilateral lower extremities.  He is on aspirin, Plavix, statin daily.  He is a former smoker.   Past Medical History:  Diagnosis Date   Aortic insufficiency    mild AI with AVSC on echo 10/2022   Cerebellar stroke (HCC) 06/24/2016   Admitted with dizziness and "falling to the left" 06/24/16. Work up revealed multifocal acute ischemia within the right cerebellar hemisphere with occlusion versus severe stenosis of the right PICA and extensive, multifocal intracranial atherosclerosis with moderate stenoses of multiple distal MCA branches.   Colon polyps    CVA (cerebral vascular accident) (HCC) 06/21/2016   Diabetes mellitus without complication (HCC) 11/11/2018   Hyperlipidemia    Hypertension    Myocardial infarction (HCC)    Pneumonia    Substance abuse (HCC)    hx of EtOH and MJ    Past Surgical History:  Procedure Laterality Date   CAROTID ENDARTERECTOMY Left    COLON SURGERY  [phone]   growth removed, Laparoscopic Vs. open Left hemicolectomy Dr. Liliane Shi 09-25-17   COLONOSCOPY N/A 06/26/2017   Procedure: COLONOSCOPY;  Surgeon: Malissa Hippo, MD;  Location: [facility];  Service: Endoscopy;   Laterality: N/A;  730   COLONOSCOPY N/A 12/01/2018   Procedure: COLONOSCOPY;  Surgeon: Malissa Hippo, MD;  Location: [facility];  Service: Endoscopy;  Laterality: N/A;  930   COLONOSCOPY WITH PROPOFOL N/A 10/07/2017   Procedure: COLONOSCOPY WITH PROPOFOL WITH TATTOO;  Surgeon: Andria Meuse, MD;  Location: [facility];  Service: General;  Laterality: N/A;   ENDARTERECTOMY Left 08/01/2016   Procedure: ENDARTERECTOMY LEFT CAROTID;  Surgeon: Chuck Hint, MD;  Location[facility];  Service: Vascular;  Laterality: Left;   FLEXIBLE SIGMOIDOSCOPY N/A 10/08/2017   Procedure: FLEXIBLE SIGMOIDOSCOPY;  Surgeon: Andria Meuse, MD;  Location: [facility];  Service: General;  Laterality: N/A;   LAPAROSCOPIC RIGHT HEMI COLECTOMY Left 10/08/2017   Procedure: LAPAROSCOPIC VERSES OPEN LEFT HEMI COLECTOMY ERAS PATHWAY;  Surgeon: Andria Meuse, MD;  Location: [facility];  Service: General;  Laterality: Left;   PATCH ANGIOPLASTY Left 08/01/2016   Procedure: PATCH ANGIOPLASTY LEFT CAROTID ARTERY USING ZENOSURE BIOLOGIC PATCH;  Surgeon: Chuck Hint, MD;  Location: [facility];  Service: Vascular;  Laterality: Left;   POLYPECTOMY  12/01/2018   Procedure: POLYPECTOMY;  Surgeon: Malissa Hippo, MD;  Location: [facility];  Service: Endoscopy;;   SUBMUCOSAL INJECTION  10/07/2017   Procedure: SUBMUCOSAL INJECTION;  Surgeon: Andria Meuse, MD;  Location: [facility];  Service: General;;   TONSILLECTOMY      Social History   Socioeconomic History   Marital  status: Divorced    Spouse name: Not on file   Number of children: 2   Years of education: 12   Highest education level: Not on file  Occupational History   Occupation: disabled [place] x8 years; 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[place], [place], [place], [place], [place], [place], [place], [place]    Comment: alcoholic  Tobacco Use   Smoking status: Former    Current packs/day: 0.00    Average packs/day: 0.5 packs/day for 29.0 years (14.5 ttl pk-yrs)    Types:  Cigarettes    Start date: 32    Quit date: 2006    Years since quitting: 19.2   Smokeless tobacco: Never  Vaping Use   Vaping status: Never Used  Substance and Sexual Activity   Alcohol use: Not Currently    Comment: quit 2006   Drug use: Not Currently    Comment: remote h/o heavy marijuana use, also used cocaine and others   Sexual activity: Not Currently  Other Topics Concern   Not on file  Social History Narrative   Divorced   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Electronics engineer for 8 years, EMT certified      Lives alone   Likes to walk      2 sons -in [place] and [place]   Social Drivers of Health   Financial Resource Strain: Low Risk  (03/10/2022)   Overall Financial Resource Strain (CARDIA)    Difficulty of Paying Living Expenses: Not hard at all  Food Insecurity: No Food Insecurity (03/10/2022)   Hunger Vital Sign    Worried About Running Out of Food in the Last Year: Never true    Ran Out of Food in the Last Year: Never true  Transportation Needs: No Transportation Needs (03/10/2022)   PRAPARE - Administrator, Civil Service (Medical): No    Lack of Transportation (Non-Medical): No  Physical Activity: Sufficiently Active (03/10/2022)   Exercise Vital Sign    Days of Exercise per Week: 7 days    Minutes of Exercise per Session: 30 min  Stress: No Stress Concern Present (03/10/2022)   [employer] of Occupational Health - Occupational Stress Questionnaire    Feeling of Stress : Not at all  Social Connections: Moderately Isolated (03/10/2022)   Social Connection and Isolation Panel [NHANES]    Frequency of Communication with Friends and Family: More than three times a week    Frequency of Social Gatherings with Friends and Family: More than three times a week    Attends Religious Services: More than 4 times per year    Active Member of [employer] or Organizations: No    Attends Banker Meetings: Never    Marital Status: Divorced  Catering manager Violence: Not At Risk  (03/10/2022)   Humiliation, Afraid, Rape, and Kick questionnaire    Fear of Current or Ex-Partner: No    Emotionally Abused: No    Physically Abused: No    Sexually Abused: No    Family History  Problem Relation Age of Onset   Cancer Mother    Breast cancer Mother        lived to be 17   CVA Mother    CAD Mother    Arthritis Mother    Hypertension Mother    Other Father        gunshot wound   Early death Father        GSW   Cancer Brother        Stomach Cancer   Cancer Brother  Lung Cancer   Stroke Maternal Aunt    Alcohol abuse Maternal Uncle    Stroke Maternal Grandfather    Colon cancer Neg Hx     Current Outpatient Medications  Medication Sig Dispense Refill   aspirin EC 81 MG tablet Take 1 tablet (81 mg total) by mouth daily. 90 tablet 3   atorvastatin (LIPITOR) 80 MG tablet TAKE 1 TABLET(80 MG) BY MOUTH DAILY 90 tablet 3   carvedilol (COREG) 6.25 MG tablet TAKE 1 TABLET(6.25 MG) BY MOUTH TWICE DAILY WITH A MEAL 180 tablet 1   Cholecalciferol (VITAMIN D) 50 MCG (2000 UT) CAPS Take 1 capsule by mouth daily.     clopidogrel (PLAVIX) 75 MG tablet TAKE 1 TABLET(75 MG) BY MOUTH DAILY 90 tablet 1   colchicine 0.6 MG tablet TAKE 2 TABLETS BY MOUTH ONCE, THEN TAKE 1 TABLET ONE HOUR LATER. TAKE 1 TABLET DAILY FROM DAY 2. 20 tablet 0   hydrALAZINE (APRESOLINE) 50 MG tablet Take 1 tablet (50 mg total) by mouth 2 (two) times daily. 60 tablet 2   lisinopril (ZESTRIL) 40 MG tablet Take 1 tablet (40 mg total) by mouth daily. 90 tablet 1   Vitamin D, Ergocalciferol, 50 MCG (2000 UT) CAPS Take 1 capsule by mouth daily.     No current facility-administered medications for this visit.    Allergies  Allergen Reactions   Blood-Group Specific Substance     No whole blood products   Diclofenac Sodium-Menthol Gel [Diclofenac Sodium-Menthol Crm]     Shortness of breath   Prednisone     Elevates blood pressure     REVIEW OF SYSTEMS:   [X]  denotes positive finding, [ ]   denotes negative finding Cardiac  Comments:  Chest pain or chest pressure:    Shortness of breath upon exertion:    Short of breath when lying flat:    Irregular heart rhythm:        Vascular    Pain in calf, thigh, or hip brought on by ambulation:    Pain in feet at night that wakes you up from your sleep:     Blood clot in your veins:    Leg swelling:         Pulmonary    Oxygen at home:    Productive cough:     Wheezing:         Neurologic    Sudden weakness in arms or legs:     Sudden numbness in arms or legs:     Sudden onset of difficulty speaking or slurred speech:    Temporary loss of vision in one eye:     Problems with dizziness:         Gastrointestinal    Blood in stool:     Vomited blood:         Genitourinary    Burning when urinating:     Blood in urine:        Psychiatric    Major depression:         Hematologic    Bleeding problems:    Problems with blood clotting too easily:        Skin    Rashes or ulcers:        Constitutional    Fever or chills:      PHYSICAL EXAMINATION:  There were no vitals filed for this visit.  General:  WDWN in NAD; vital signs documented above Gait: Not observed HENT: WNL, normocephalic Pulmonary: normal non-labored breathing ,  without Rales, rhonchi,  wheezing Cardiac: regular HR Abdomen: soft, NT, no masses Skin: without rashes Vascular Exam/Pulses: Symmetrical radial pulses Extremities: without ischemic changes, without Gangrene , without cellulitis; without open wounds;  Musculoskeletal: no muscle wasting or atrophy  Neurologic: A&O X 3; cranial nerves grossly intact Psychiatric:  The pt has Normal affect.   Non-Invasive Vascular Imaging:   Right ICA 1 to 39% stenosis Left carotid endarterectomy site widely patent Abnormal waveforms right vertebral and subclavian    ASSESSMENT/PLAN:: 73 y.o. male here for follow up for surveillance of carotid artery stenosis as well as PAD  Subjectively he has  not experienced any strokelike symptoms including slurring speech, changes in vision, or one-sided weakness.  Carotid duplex is stable with 1 to 39% stenosis of the right ICA and widely patent endarterectomy site of the left ICA.  He has abnormal right subclavian and vertebral waveforms however does not experience right arm claudication or drop attacks.  No indication for further workup at this time.  We will repeat carotid duplex in 1 year.  He is also followed for PAD with history of right ABI of 0.6.  He occasionally has right calf claudication however symptoms are tolerable.  He does not have rest pain or tissue loss.  No indication for revascularization or further workup at this time.  We will check an ABI in 1 year.  He will continue regular follow-up with podiatry every 3 months.  He will continue his aspirin, Plavix, statin daily.   Emilie Rutter, PA-C [facility] [phone]

## 2023-05-13 LAB — CMP14+EGFR
ALT: 29 IU/L (ref 0–44)
AST: 34 IU/L (ref 0–40)
Albumin: 4.4 g/dL (ref 3.8–4.8)
Alkaline Phosphatase: 85 IU/L (ref 44–121)
BUN/Creatinine Ratio: 16 (ref 10–24)
BUN: 20 mg/dL (ref 8–27)
Bilirubin Total: 0.4 mg/dL (ref 0.0–1.2)
CO2: 19 mmol/L — ABNORMAL LOW (ref 20–29)
Calcium: 9.5 mg/dL (ref 8.6–10.2)
Chloride: 100 mmol/L (ref 96–106)
Creatinine, Ser: 1.22 mg/dL (ref 0.76–1.27)
Globulin, Total: 2.9 g/dL (ref 1.5–4.5)
Glucose: 96 mg/dL (ref 70–99)
Potassium: 4.8 mmol/L (ref 3.5–5.2)
Sodium: 136 mmol/L (ref 134–144)
Total Protein: 7.3 g/dL (ref 6.0–8.5)
eGFR: 63 mL/min/{1.73_m2} (ref >59)

## 2023-05-13 LAB — CBC WITH DIFFERENTIAL/PLATELET
Basophils Absolute: 0.1 10*3/uL (ref 0.0–0.2)
Basos: 1 %
EOS (ABSOLUTE): 0.3 10*3/uL (ref 0.0–0.4)
Eos: 5 %
Hematocrit: 35.9 % — ABNORMAL LOW (ref 37.5–51.0)
Hemoglobin: 12.2 g/dL — ABNORMAL LOW (ref 13.0–17.7)
Immature Grans (Abs): 0 10*3/uL (ref 0.0–0.1)
Immature Granulocytes: 0 %
Lymphocytes Absolute: 1.8 10*3/uL (ref 0.7–3.1)
Lymphs: 26 %
MCH: 29.3 pg (ref 26.6–33.0)
MCHC: 34 g/dL (ref 31.5–35.7)
MCV: 86 fL (ref 79–97)
Monocytes Absolute: 0.7 10*3/uL (ref 0.1–0.9)
Monocytes: 11 %
Neutrophils Absolute: 3.9 10*3/uL (ref 1.4–7.0)
Neutrophils: 57 %
Platelets: 339 10*3/uL (ref 150–450)
RBC: 4.17 x10E6/uL (ref 4.14–5.80)
RDW: 13.3 % (ref 11.6–15.4)
WBC: 6.7 10*3/uL (ref 3.4–10.8)

## 2023-05-13 LAB — HEMOGLOBIN A1C
Est. average glucose Bld gHb Est-mCnc: 140 mg/dL
Hgb A1c MFr Bld: 6.5 % — ABNORMAL HIGH (ref 4.8–5.6)

## 2023-05-25 ENCOUNTER — Encounter: Payer: Self-pay | Admitting: Internal Medicine

## 2023-05-25 ENCOUNTER — Ambulatory Visit (INDEPENDENT_AMBULATORY_CARE_PROVIDER_SITE_OTHER): Payer: Medicare HMO | Admitting: Internal Medicine

## 2023-05-25 VITALS — BP 136/78 | HR 71 | Ht 75.0 in | Wt 199.0 lb

## 2023-05-25 DIAGNOSIS — Z7984 Long term (current) use of oral hypoglycemic drugs: Secondary | ICD-10-CM

## 2023-05-25 DIAGNOSIS — D649 Anemia, unspecified: Secondary | ICD-10-CM

## 2023-05-25 DIAGNOSIS — I10 Essential (primary) hypertension: Secondary | ICD-10-CM

## 2023-05-25 DIAGNOSIS — N1831 Chronic kidney disease, stage 3a: Secondary | ICD-10-CM | POA: Diagnosis not present

## 2023-05-25 DIAGNOSIS — E1169 Type 2 diabetes mellitus with other specified complication: Secondary | ICD-10-CM | POA: Diagnosis not present

## 2023-05-25 DIAGNOSIS — I5032 Chronic diastolic (congestive) heart failure: Secondary | ICD-10-CM

## 2023-05-25 DIAGNOSIS — L819 Disorder of pigmentation, unspecified: Secondary | ICD-10-CM | POA: Diagnosis not present

## 2023-05-25 DIAGNOSIS — E782 Mixed hyperlipidemia: Secondary | ICD-10-CM

## 2023-05-25 MED ORDER — LISINOPRIL 40 MG PO TABS: 40.0000 mg | ORAL_TABLET | Freq: Every day | ORAL | 3 refills | Status: DC

## 2023-05-25 MED ORDER — HYDRALAZINE HCL 50 MG PO TABS: 50.0000 mg | ORAL_TABLET | Freq: Two times a day (BID) | ORAL | 1 refills | Status: DC

## 2023-05-25 NOTE — Assessment & Plan Note (Signed)
 His Hb remains stable around 12 Denies any signs of active bleeding, but takes Aspirin and Plavix due to h/o PAD Advised tot take ferrous sulfate 325 mg once daily

## 2023-05-25 NOTE — Assessment & Plan Note (Signed)
 Lab Results  Component Value Date   HGBA1C 6.5 (H) 05/12/2023   Diet controlled Associated with HTN, HLD, PAD, CKD and CHF Had added Farxiga for HFpEF and CKD, but did not take it despite counseling Advised to follow diabetic diet On statin and ACEi F/u CMP and lipid panel Diabetic eye exam: Advised to follow up with Ophthalmology for diabetic eye exam

## 2023-05-25 NOTE — Assessment & Plan Note (Signed)
Last Echo reviewed On ACEi and B-blocker Had added Farxiga, he did not take it as he was concerned about its side effects, after counseling he still denied to start taking it

## 2023-05-25 NOTE — Assessment & Plan Note (Signed)
 Due to recent increase in size of mole, referred to Dermatology - Dr. Margo Aye

## 2023-05-25 NOTE — Patient Instructions (Addendum)
 Please start taking iron supplement - ferrous sulfate 325 mg once daily.  Please continue to take medications as prescribed.  Please continue to follow low carb diet and perform moderate exercise/walking at least 150 mins/week.

## 2023-05-25 NOTE — Assessment & Plan Note (Signed)
 BMP reviewed, GFR stays around 55 Likely from HTN, DM and age related decline On lisinopril for HTN Had added Farxiga for CKD and HFpEF, but did not take it due to concern for side effects Avoid nephrotoxic agents Advised to improve fluid intake to at least 64 ounces of fluid in a day

## 2023-05-25 NOTE — Assessment & Plan Note (Signed)
Lipid profile reviewed Continue Lipitor 80 mg every other day (has leg cramps with daily dosing) - needs to take it regularly Did not tolerate Zetia If persistent elevated LDL, will need Repatha or Praluent - cost can be a concern

## 2023-05-25 NOTE — Progress Notes (Signed)
 Established Patient Office Visit  Subjective:  Patient ID: Albert Morris, male    DOB: 06/15/1950  Age: 73 y.o. MRN: [id number]  CC:  Chief Complaint  Patient presents with   Care Management    2 month f/u    HPI Albert Morris is a 73 y.o. male with past medical history of HTN, CAD, type II DM with HLD, CVA and tobacco abuse who presents for f/u of his chronic medical conditions.  HTN: BP was wnl today, better than prior. Takes lisinopril 40 mg once daily, Coreg 6.25 mg twice daily and hydralazine 50 mg twice daily regularly. He has brought home BP readings, which are around 120s/70s at home mostly. Patient denies headache, dizziness, chest pain, dyspnea or palpitations.  HLD: He has been taking Lipitor now.   Type 2 DM: His HbA1C is stable at 6.5. Diet controlled.  He was given [place] considering history of HFpEF, CAD and PAD, but he did not take it considering its side effects.  He was counseled about its potential benefits and his low risk of side effects in the last visit, but does not prefer to take medicine for it. Denies any polyuria or polyphagia.  He reports increasing size of mole of right side of the face recently.   Past Medical History:  Diagnosis Date   Aortic insufficiency    mild AI with AVSC on echo 10/2022   Cerebellar stroke (HCC) 06/24/2016   Admitted with dizziness and "falling to the left" 06/24/16. Work up revealed multifocal acute ischemia within the right cerebellar hemisphere with occlusion versus severe stenosis of the right PICA and extensive, multifocal intracranial atherosclerosis with moderate stenoses of multiple distal MCA branches.   Colon polyps    CVA (cerebral vascular accident) (HCC) 06/21/2016   Diabetes mellitus without complication (HCC) 11/11/2018   Hyperlipidemia    Hypertension    Myocardial infarction (HCC)    Pneumonia    Substance abuse (HCC)    hx of EtOH and MJ    Past Surgical History:  Procedure Laterality Date    CAROTID ENDARTERECTOMY Left    COLON SURGERY  [phone]   growth removed, Laparoscopic Vs. open Left hemicolectomy Dr. Liliane Shi 09-25-17   COLONOSCOPY N/A 06/26/2017   Procedure: COLONOSCOPY;  Surgeon: Malissa Hippo, MD;  Location: [facility];  Service: Endoscopy;  Laterality: N/A;  730   COLONOSCOPY N/A 12/01/2018   Procedure: COLONOSCOPY;  Surgeon: Malissa Hippo, MD;  Location: [facility];  Service: Endoscopy;  Laterality: N/A;  930   COLONOSCOPY WITH PROPOFOL N/A 10/07/2017   Procedure: COLONOSCOPY WITH PROPOFOL WITH TATTOO;  Surgeon: Andria Meuse, MD;  Location: [facility];  Service: General;  Laterality: N/A;   ENDARTERECTOMY Left 08/01/2016   Procedure: ENDARTERECTOMY LEFT CAROTID;  Surgeon: Chuck Hint, MD;  Location: [facility] OR;  Service: Vascular;  Laterality: Left;   FLEXIBLE SIGMOIDOSCOPY N/A 10/08/2017   Procedure: FLEXIBLE SIGMOIDOSCOPY;  Surgeon: Andria Meuse, MD;  Location: [facility];  Service: General;  Laterality: N/A;   LAPAROSCOPIC RIGHT HEMI COLECTOMY Left 10/08/2017   Procedure: LAPAROSCOPIC VERSES OPEN LEFT HEMI COLECTOMY ERAS PATHWAY;  Surgeon: Andria Meuse, MD;  Location: [facility];  Service: General;  Laterality: Left;   PATCH ANGIOPLASTY Left 08/01/2016   Procedure: PATCH ANGIOPLASTY LEFT CAROTID ARTERY USING ZENOSURE BIOLOGIC PATCH;  Surgeon: Chuck Hint, MD;  Location: [facility] OR;  Service: Vascular;  Laterality: Left;   POLYPECTOMY  12/01/2018   Procedure: POLYPECTOMY;  Surgeon:  Malissa Hippo, MD;  Location: [facility];  Service: Endoscopy;;   SUBMUCOSAL INJECTION  10/07/2017   Procedure: SUBMUCOSAL INJECTION;  Surgeon: Andria Meuse, MD;  Location: [facility];  Service: General;;   TONSILLECTOMY      Family History  Problem Relation Age of Onset   Cancer Mother    Breast cancer Mother        lived to be 4   CVA Mother    CAD Mother    Arthritis Mother    Hypertension Mother    Other Father        gunshot  wound   Early death Father        GSW   Cancer Brother        Stomach Cancer   Cancer Brother        Lung Cancer   Stroke Maternal Aunt    Alcohol abuse Maternal Uncle    Stroke Maternal Grandfather    Colon cancer Neg Hx     Social History   Socioeconomic History   Marital status: Divorced    Spouse name: Not on file   Number of children: 2   Years of education: 12   Highest education level: Not on file  Occupational History   Occupation: disabled Cytogeneticist x8 years; 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[place], [place], [place], [place], [place], [place], [place], [place]    Comment: alcoholic  Tobacco Use   Smoking status: Former    Current packs/day: 0.00    Average packs/day: 0.5 packs/day for 29.0 years (14.5 ttl pk-yrs)    Types: Cigarettes    Start date: 7    Quit date: 2006    Years since quitting: 19.2   Smokeless tobacco: Never  Vaping Use   Vaping status: Never Used  Substance and Sexual Activity   Alcohol use: Not Currently    Comment: quit 2006   Drug use: Not Currently    Comment: remote h/o heavy marijuana use, also used cocaine and others   Sexual activity: Not Currently  Other Topics Concern   Not on file  Social History Narrative   Divorced   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Electronics engineer for 8 years, EMT certified      Lives alone   Likes to walk      2 sons -in [place] and [place]   Social Drivers of Health   Financial Resource Strain: Low Risk  (03/10/2022)   Overall Financial Resource Strain (CARDIA)    Difficulty of Paying Living Expenses: Not hard at all  Food Insecurity: No Food Insecurity (03/10/2022)   Hunger Vital Sign    Worried About Running Out of Food in the Last Year: Never true    Ran Out of Food in the Last Year: Never true  Transportation Needs: No Transportation Needs (03/10/2022)   PRAPARE - Administrator, Civil Service (Medical): No    Lack of Transportation (Non-Medical): No  Physical Activity: Sufficiently Active (03/10/2022)   Exercise Vital Sign    Days of Exercise  per Week: 7 days    Minutes of Exercise per Session: 30 min  Stress: No Stress Concern Present (03/10/2022)   [employer] of Occupational Health - Occupational Stress Questionnaire    Feeling of Stress : Not at all  Social Connections: Moderately Isolated (03/10/2022)   Social Connection and Isolation Panel [NHANES]    Frequency of Communication with Friends and Family: More than three times a week    Frequency of Social Gatherings with Friends and Family:  More than three times a week    Attends Religious Services: More than 4 times per year    Active Member of Clubs or Organizations: No    Attends Banker Meetings: Never    Marital Status: Divorced  Catering manager Violence: Not At Risk (03/10/2022)   Humiliation, Afraid, Rape, and Kick questionnaire    Fear of Current or Ex-Partner: No    Emotionally Abused: No    Physically Abused: No    Sexually Abused: No    Outpatient Medications Prior to Visit  Medication Sig Dispense Refill   aspirin EC 81 MG tablet Take 1 tablet (81 mg total) by mouth daily. 90 tablet 3   atorvastatin (LIPITOR) 80 MG tablet TAKE 1 TABLET(80 MG) BY MOUTH DAILY 90 tablet 3   carvedilol (COREG) 6.25 MG tablet TAKE 1 TABLET(6.25 MG) BY MOUTH TWICE DAILY WITH A MEAL 180 tablet 1   Cholecalciferol (VITAMIN D) 50 MCG (2000 UT) CAPS Take 1 capsule by mouth daily.     clopidogrel (PLAVIX) 75 MG tablet TAKE 1 TABLET(75 MG) BY MOUTH DAILY 90 tablet 1   colchicine 0.6 MG tablet TAKE 2 TABLETS BY MOUTH ONCE, THEN TAKE 1 TABLET ONE HOUR LATER. TAKE 1 TABLET DAILY FROM DAY 2. 20 tablet 0   Vitamin D, Ergocalciferol, 50 MCG (2000 UT) CAPS Take 1 capsule by mouth daily.     hydrALAZINE (APRESOLINE) 50 MG tablet Take 1 tablet (50 mg total) by mouth 2 (two) times daily. 60 tablet 2   lisinopril (ZESTRIL) 40 MG tablet Take 1 tablet (40 mg total) by mouth daily. 90 tablet 1   No facility-administered medications prior to visit.    Allergies  Allergen  Reactions   Blood-Group Specific Substance     No whole blood products   Diclofenac Sodium-Menthol Gel [Diclofenac Sodium-Menthol Crm]     Shortness of breath   Prednisone     Elevates blood pressure    ROS Review of Systems  Constitutional:  Negative for chills and fever.  HENT:  Negative for congestion and sore throat.   Eyes:  Negative for pain and discharge.  Respiratory:  Negative for cough and shortness of breath.   Cardiovascular:  Negative for chest pain and palpitations.  Gastrointestinal:  Negative for constipation, diarrhea, nausea and vomiting.  Endocrine: Negative for polydipsia and polyuria.  Genitourinary:  Negative for dysuria and hematuria.  Musculoskeletal:  Negative for neck pain and neck stiffness.  Skin:  Negative for rash.  Neurological:  Negative for dizziness, weakness, numbness and headaches.  Psychiatric/Behavioral:  Negative for agitation and behavioral problems.       Objective:    Physical Exam Vitals reviewed.  Constitutional:      General: He is not in acute distress.    Appearance: He is not diaphoretic.  HENT:     Head: Normocephalic and atraumatic.     Nose: Nose normal.     Mouth/Throat:     Mouth: Mucous membranes are moist.  Eyes:     General: No scleral icterus.    Extraocular Movements: Extraocular movements intact.  Cardiovascular:     Rate and Rhythm: Normal rate and regular rhythm.     Heart sounds: Normal heart sounds. No murmur heard. Pulmonary:     Breath sounds: Normal breath sounds. No wheezing or rales.  Musculoskeletal:     Cervical back: Neck supple. No tenderness.     Right lower leg: No edema.     Left lower leg: No  edema.  Skin:    General: Skin is warm.     Findings: Lesion (Mole over right side of face, about 3 cm below lower eyelid) present. No rash.  Neurological:     General: No focal deficit present.     Mental Status: He is alert and oriented to person, place, and time.     Sensory: No sensory deficit.      Motor: No weakness.  Psychiatric:        Mood and Affect: Mood normal.        Behavior: Behavior normal.     BP 136/78 (BP Location: Left Arm)   Pulse 71   Ht 6\' 3"  (1.905 m)   Wt 199 lb (90.3 kg)   SpO2 94%   BMI 24.87 kg/m  Wt Readings from Last 3 Encounters:  05/25/23 199 lb (90.3 kg)  05/12/23 200 lb (90.7 kg)  04/23/23 199 lb (90.3 kg)    Lab Results  Component Value Date   TSH 1.490 09/04/2021   Lab Results  Component Value Date   WBC 6.7 05/12/2023   HGB 12.2 (L) 05/12/2023   HCT 35.9 (L) 05/12/2023   MCV 86 05/12/2023   PLT 339 05/12/2023   Lab Results  Component Value Date   NA 136 05/12/2023   K 4.8 05/12/2023   CO2 19 (L) 05/12/2023   GLUCOSE 96 05/12/2023   BUN 20 05/12/2023   CREATININE 1.22 05/12/2023   BILITOT 0.4 05/12/2023   ALKPHOS 85 05/12/2023   AST 34 05/12/2023   ALT 29 05/12/2023   PROT 7.3 05/12/2023   ALBUMIN 4.4 05/12/2023   CALCIUM 9.5 05/12/2023   ANIONGAP 11 10/12/2017   EGFR 63 05/12/2023   Lab Results  Component Value Date   CHOL 140 01/12/2023   Lab Results  Component Value Date   HDL 47 01/12/2023   Lab Results  Component Value Date   LDLCALC 83 01/12/2023   Lab Results  Component Value Date   TRIG 44 01/12/2023   Lab Results  Component Value Date   CHOLHDL 3.0 01/12/2023   Lab Results  Component Value Date   HGBA1C 6.5 (H) 05/12/2023      Assessment & Plan:   Problem List Items Addressed This Visit       Cardiovascular and Mediastinum   Essential hypertension - Primary   BP Readings from Last 1 Encounters:  05/25/23 136/78   Well-controlled now On lisinopril 40 mg once daily, Coreg 6.25 mg BID and Hydralazine 50 mg BID Avoid increasing dose of Coreg as his HR is in 60s currently Due to recurrent episodes of gout, discontinued HCTZ Counseled for compliance with the medications Advised DASH diet and moderate exercise/walking as tolerated      Relevant Medications   lisinopril (ZESTRIL)  40 MG tablet   hydrALAZINE (APRESOLINE) 50 MG tablet   Other Relevant Orders   CBC with Differential/Platelet   CMP14+EGFR   Chronic heart failure with preserved ejection fraction (HCC)   Last Echo reviewed On ACEi and B-blocker Had added Farxiga, he did not take it as he was concerned about its side effects, after counseling he still denied to start taking it      Relevant Medications   lisinopril (ZESTRIL) 40 MG tablet   hydrALAZINE (APRESOLINE) 50 MG tablet     Endocrine   Type 2 diabetes mellitus with other specified complication (HCC)   Lab Results  Component Value Date   HGBA1C 6.5 (H) 05/12/2023   Diet  controlled Associated with HTN, HLD, PAD, CKD and CHF Had added Farxiga for HFpEF and CKD, but did not take it despite counseling Advised to follow diabetic diet On statin and ACEi F/u CMP and lipid panel Diabetic eye exam: Advised to follow up with Ophthalmology for diabetic eye exam      Relevant Medications   lisinopril (ZESTRIL) 40 MG tablet   Other Relevant Orders   Microalbumin / creatinine urine ratio   CMP14+EGFR   Hemoglobin A1c     Musculoskeletal and Integument   Change in pigmented skin lesion of face   Due to recent increase in size of mole, referred to Dermatology - Dr. Margo Aye      Relevant Orders   Ambulatory referral to Dermatology     Genitourinary   Stage 3a chronic kidney disease (HCC)   BMP reviewed, GFR stays around 55 Likely from HTN, DM and age related decline On lisinopril for HTN Had added Farxiga for CKD and HFpEF, but did not take it due to concern for side effects Avoid nephrotoxic agents Advised to improve fluid intake to at least 64 ounces of fluid in a day      Relevant Orders   CBC with Differential/Platelet   CMP14+EGFR     Other   Anemia   His Hb remains stable around 12 Denies any signs of active bleeding, but takes Aspirin and Plavix due to h/o PAD Advised tot take ferrous sulfate 325 mg once daily      Mixed  hyperlipidemia   Lipid profile reviewed Continue Lipitor 80 mg every other day (has leg cramps with daily dosing) - needs to take it regularly Did not tolerate Zetia If persistent elevated LDL, will need Repatha or Praluent - cost can be a concern      Relevant Medications   lisinopril (ZESTRIL) 40 MG tablet   hydrALAZINE (APRESOLINE) 50 MG tablet   Other Relevant Orders   Lipid Profile      Meds ordered this encounter  Medications   lisinopril (ZESTRIL) 40 MG tablet    Sig: Take 1 tablet (40 mg total) by mouth daily.    Dispense:  90 tablet    Refill:  3   hydrALAZINE (APRESOLINE) 50 MG tablet    Sig: Take 1 tablet (50 mg total) by mouth 2 (two) times daily.    Dispense:  180 tablet    Refill:  1    Follow-up: Return in about 4 months (around 09/24/2023) for Annual physical.    Anabel Halon, MD

## 2023-05-25 NOTE — Assessment & Plan Note (Signed)
 BP Readings from Last 1 Encounters:  05/25/23 136/78   Well-controlled now On lisinopril 40 mg once daily, Coreg 6.25 mg BID and Hydralazine 50 mg BID Avoid increasing dose of Coreg as his HR is in 60s currently Due to recurrent episodes of gout, discontinued HCTZ Counseled for compliance with the medications Advised DASH diet and moderate exercise/walking as tolerated

## 2023-05-28 DIAGNOSIS — E1169 Type 2 diabetes mellitus with other specified complication: Secondary | ICD-10-CM | POA: Diagnosis not present

## 2023-05-29 LAB — MICROALBUMIN / CREATININE URINE RATIO
Creatinine, Urine: 100.4 mg/dL
Microalb/Creat Ratio: 6 mg/g{creat} (ref 0–29)
Microalbumin, Urine: 5.9 ug/mL

## 2023-06-23 ENCOUNTER — Other Ambulatory Visit: Payer: Self-pay | Admitting: Internal Medicine

## 2023-06-23 DIAGNOSIS — Z8673 Personal history of transient ischemic attack (TIA), and cerebral infarction without residual deficits: Secondary | ICD-10-CM

## 2023-07-24 ENCOUNTER — Encounter: Payer: Self-pay | Admitting: Podiatry

## 2023-07-24 ENCOUNTER — Ambulatory Visit: Admitting: Podiatry

## 2023-07-24 DIAGNOSIS — M79674 Pain in right toe(s): Secondary | ICD-10-CM | POA: Diagnosis not present

## 2023-07-24 DIAGNOSIS — E119 Type 2 diabetes mellitus without complications: Secondary | ICD-10-CM | POA: Diagnosis not present

## 2023-07-24 DIAGNOSIS — B351 Tinea unguium: Secondary | ICD-10-CM | POA: Diagnosis not present

## 2023-07-24 DIAGNOSIS — M79675 Pain in left toe(s): Secondary | ICD-10-CM

## 2023-07-24 NOTE — Progress Notes (Signed)
 This patient returns to my office for at risk foot care.  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  Patient is taking plavix.  This patient is unable to cut nails himself since the patient cannot reach his nails.These nails are painful walking and wearing shoes.  Patient says his vascular status is being evaluated.This patient presents for at risk foot care today.  General Appearance  Alert, conversant and in no acute stress.  Vascular  Dorsalis pedis and posterior tibial  pulses are weakly  palpable  bilaterally.  Capillary return is within normal limits  bilaterally. Temperature is within normal limits  bilaterally.  Neurologic  Senn-Weinstein monofilament wire test within normal limits/diminished   bilaterally. Muscle power within normal limits bilaterally.  Nails Thick disfigured discolored nails with subungual debris  from hallux to fifth toes bilaterally. No evidence of bacterial infection or drainage bilaterally.  Orthopedic  No limitations of motion  feet .  No crepitus or effusions noted.  No bony pathology or digital deformities noted. HAV  B/L.  Skin  normotropic skin with no porokeratosis noted bilaterally.  No signs of infections or ulcers noted.   Asymptomatic thickness of skin sub 5th met left foot.  Onychomycosis  Pain in right toes  Pain in left toes  Consent was obtained for treatment procedures.   Mechanical debridement of nails 1-5  bilaterally performed with a nail nipper.  Filed with dremel without incident.    Return office visit   3 months                  Told patient to return for periodic foot care and evaluation due to potential at risk complications.   Helane Gunther DPM

## 2023-07-30 DIAGNOSIS — B078 Other viral warts: Secondary | ICD-10-CM | POA: Diagnosis not present

## 2023-07-30 DIAGNOSIS — L820 Inflamed seborrheic keratosis: Secondary | ICD-10-CM | POA: Diagnosis not present

## 2023-09-07 DIAGNOSIS — E119 Type 2 diabetes mellitus without complications: Secondary | ICD-10-CM | POA: Diagnosis not present

## 2023-09-11 ENCOUNTER — Other Ambulatory Visit: Payer: Self-pay

## 2023-09-11 ENCOUNTER — Telehealth: Payer: Self-pay | Admitting: Internal Medicine

## 2023-09-11 DIAGNOSIS — I10 Essential (primary) hypertension: Secondary | ICD-10-CM

## 2023-09-11 MED ORDER — CARVEDILOL 6.25 MG PO TABS: 6.2500 mg | ORAL_TABLET | Freq: Two times a day (BID) | ORAL | 1 refills | Status: DC

## 2023-09-11 NOTE — Telephone Encounter (Signed)
 Prescription Request  09/11/2023  LOV: 05/25/2023  What is the name of the medication or equipment? carvedilol  (COREG ) 6.25 MG tablet [[id number]]   Have you contacted your pharmacy to request a refill? Yes   Which pharmacy would you like this sent to?  [facility] [address] [place]  Patient notified that their request is being sent to the clinical staff for review and that they should receive a response within 2 business days.   Please advise at patient walked into the office

## 2023-09-11 NOTE — Telephone Encounter (Signed)
 Sent to pharmacy

## 2023-09-22 ENCOUNTER — Telehealth: Payer: Self-pay | Admitting: Internal Medicine

## 2023-09-22 ENCOUNTER — Other Ambulatory Visit: Payer: Self-pay | Admitting: Internal Medicine

## 2023-09-22 DIAGNOSIS — Z8673 Personal history of transient ischemic attack (TIA), and cerebral infarction without residual deficits: Secondary | ICD-10-CM

## 2023-09-22 DIAGNOSIS — N1831 Chronic kidney disease, stage 3a: Secondary | ICD-10-CM | POA: Diagnosis not present

## 2023-09-22 DIAGNOSIS — E782 Mixed hyperlipidemia: Secondary | ICD-10-CM | POA: Diagnosis not present

## 2023-09-22 DIAGNOSIS — I10 Essential (primary) hypertension: Secondary | ICD-10-CM | POA: Diagnosis not present

## 2023-09-22 DIAGNOSIS — E1169 Type 2 diabetes mellitus with other specified complication: Secondary | ICD-10-CM | POA: Diagnosis not present

## 2023-09-22 MED ORDER — CLOPIDOGREL BISULFATE 75 MG PO TABS: ORAL_TABLET | ORAL | 3 refills | Status: DC

## 2023-09-22 NOTE — Telephone Encounter (Signed)
 Prescription Request  09/22/2023  LOV: 05/25/2023  What is the name of the medication or equipment? clopidogrel  (PLAVIX ) 75 MG tablet [[id number]]   Have you contacted your pharmacy to request a refill? Yes   Which pharmacy would you like this sent to?  [facility] [address]   Patient notified that their request is being sent to the clinical staff for review and that they should receive a response within 2 business days.   Please advise at walked into office

## 2023-09-23 LAB — CBC WITH DIFFERENTIAL/PLATELET
Basophils Absolute: 0 x10E3/uL (ref 0.0–0.2)
Basos: 1 %
EOS (ABSOLUTE): 0.3 x10E3/uL (ref 0.0–0.4)
Eos: 6 %
Hematocrit: 35.6 % — ABNORMAL LOW (ref 37.5–51.0)
Hemoglobin: 12.1 g/dL — ABNORMAL LOW (ref 13.0–17.7)
Immature Grans (Abs): 0 x10E3/uL (ref 0.0–0.1)
Immature Granulocytes: 0 %
Lymphocytes Absolute: 1.4 x10E3/uL (ref 0.7–3.1)
Lymphs: 26 %
MCH: 30.1 pg (ref 26.6–33.0)
MCHC: 34 g/dL (ref 31.5–35.7)
MCV: 89 fL (ref 79–97)
Monocytes Absolute: 0.7 x10E3/uL (ref 0.1–0.9)
Monocytes: 12 %
Neutrophils Absolute: 3 x10E3/uL (ref 1.4–7.0)
Neutrophils: 55 %
Platelets: 261 x10E3/uL (ref 150–450)
RBC: 4.02 x10E6/uL — ABNORMAL LOW (ref 4.14–5.80)
RDW: 12.9 % (ref 11.6–15.4)
WBC: 5.5 x10E3/uL (ref 3.4–10.8)

## 2023-09-23 LAB — CMP14+EGFR
ALT: 24 IU/L (ref 0–44)
AST: 29 IU/L (ref 0–40)
Albumin: 4.2 g/dL (ref 3.8–4.8)
Alkaline Phosphatase: 73 IU/L (ref 44–121)
BUN/Creatinine Ratio: 17 (ref 10–24)
BUN: 25 mg/dL (ref 8–27)
Bilirubin Total: 0.5 mg/dL (ref 0.0–1.2)
CO2: 22 mmol/L (ref 20–29)
Calcium: 9.5 mg/dL (ref 8.6–10.2)
Chloride: 97 mmol/L (ref 96–106)
Creatinine, Ser: 1.43 mg/dL — ABNORMAL HIGH (ref 0.76–1.27)
Globulin, Total: 2.8 g/dL (ref 1.5–4.5)
Glucose: 92 mg/dL (ref 70–99)
Potassium: 4.3 mmol/L (ref 3.5–5.2)
Sodium: 133 mmol/L — ABNORMAL LOW (ref 134–144)
Total Protein: 7 g/dL (ref 6.0–8.5)
eGFR: 51 mL/min/1.73 — ABNORMAL LOW (ref >59)

## 2023-09-23 LAB — LIPID PANEL
Chol/HDL Ratio: 3.2 ratio (ref 0.0–5.0)
Cholesterol, Total: 137 mg/dL (ref 100–199)
HDL: 43 mg/dL (ref >39)
LDL Chol Calc (NIH): 83 mg/dL (ref 0–99)
Triglycerides: 52 mg/dL (ref 0–149)
VLDL Cholesterol Cal: 11 mg/dL (ref 5–40)

## 2023-09-23 LAB — HEMOGLOBIN A1C
Est. average glucose Bld gHb Est-mCnc: 137 mg/dL
Hgb A1c MFr Bld: 6.4 % — ABNORMAL HIGH (ref 4.8–5.6)

## 2023-09-30 ENCOUNTER — Ambulatory Visit (INDEPENDENT_AMBULATORY_CARE_PROVIDER_SITE_OTHER): Admitting: Internal Medicine

## 2023-09-30 ENCOUNTER — Encounter: Payer: Self-pay | Admitting: Internal Medicine

## 2023-09-30 VITALS — BP 118/70 | HR 75 | Ht 75.0 in | Wt 198.8 lb

## 2023-09-30 DIAGNOSIS — Z125 Encounter for screening for malignant neoplasm of prostate: Secondary | ICD-10-CM | POA: Diagnosis not present

## 2023-09-30 DIAGNOSIS — I6523 Occlusion and stenosis of bilateral carotid arteries: Secondary | ICD-10-CM

## 2023-09-30 DIAGNOSIS — I10 Essential (primary) hypertension: Secondary | ICD-10-CM

## 2023-09-30 DIAGNOSIS — Z0001 Encounter for general adult medical examination with abnormal findings: Secondary | ICD-10-CM

## 2023-09-30 DIAGNOSIS — I5032 Chronic diastolic (congestive) heart failure: Secondary | ICD-10-CM | POA: Diagnosis not present

## 2023-09-30 DIAGNOSIS — E1169 Type 2 diabetes mellitus with other specified complication: Secondary | ICD-10-CM | POA: Diagnosis not present

## 2023-09-30 DIAGNOSIS — N1831 Chronic kidney disease, stage 3a: Secondary | ICD-10-CM

## 2023-09-30 NOTE — Assessment & Plan Note (Signed)
Last Echo reviewed On ACEi and B-blocker Had added Farxiga, he did not take it as he was concerned about its side effects, after counseling he still denied to start taking it

## 2023-09-30 NOTE — Assessment & Plan Note (Addendum)
 CMP reviewed, GFR stays around 55 Likely from HTN, DM and age related decline On lisinopril  for HTN Had added Farxiga  for CKD and HFpEF, but did not take it due to concern for side effects Avoid nephrotoxic agents Advised to improve fluid intake to at least 64 ounces of fluid in a day

## 2023-09-30 NOTE — Assessment & Plan Note (Signed)
Followed by Vascular surgery, has had left carotid endarterectomy On DAPT and statin 

## 2023-09-30 NOTE — Assessment & Plan Note (Addendum)
 Physical exam as documented. Counseling done  re healthy lifestyle involving commitment to 150 minutes exercise per week, heart healthy diet, and attaining healthy weight.The importance of adequate sleep also discussed. Immunization and cancer screening needs are specifically addressed at this visit.  Denies pneumococcal vaccine. Advised to get Shingrix and Tdap vaccines at local pharmacy.

## 2023-09-30 NOTE — Assessment & Plan Note (Signed)
 BP Readings from Last 1 Encounters:  09/30/23 118/70   Well-controlled now On lisinopril  40 mg once daily, Coreg  6.25 mg BID and Hydralazine  50 mg BID Due to recurrent episodes of gout, discontinued HCTZ Counseled for compliance with the medications Advised DASH diet and moderate exercise/walking as tolerated

## 2023-09-30 NOTE — Assessment & Plan Note (Signed)
 Ordered PSA after discussing its limitations for prostate cancer screening, including false positive results leading to additional investigations.

## 2023-09-30 NOTE — Patient Instructions (Addendum)
 Please continue to take medications as prescribed.  Please continue to follow low carb diet and perform moderate exercise/walking as tolerated.  Please get blood tests done before the next visit.  Please consider getting Shingrix and Tdap vaccine at local pharmacy.  Please contact [facility] at [phone] for follow up colonoscopy.

## 2023-09-30 NOTE — Progress Notes (Signed)
 Established Patient Office Visit  Subjective:  Patient ID: Albert Morris, male    DOB: [birth year]  Age: 73 y.o. MRN: [id number]  CC:  Chief Complaint  Patient presents with   Annual Exam    Cpe     HPI Albert Morris is a 73 y.o. male with past medical history of HTN, CAD, type II DM with HLD, CVA and tobacco abuse who presents for annual physical.  HTN: BP was wnl today, better than prior. Takes lisinopril  40 mg once daily, Coreg  6.25 mg twice daily and hydralazine  50 mg twice daily regularly. He has brought home BP readings, which are around 120s/70s at home mostly. Patient denies headache, dizziness, chest pain, dyspnea or palpitations.  CKD: His CMP showed GFR of 51, which is worse compared to previous visit.  He is trying to drink adequate hydration.  Denies any dysuria, hematuria, urinary hesitancy or resistance.  HLD: He has been taking Lipitor now.   Type 2 DM: His HbA1C is stable at 6.4. Diet controlled.  He was given Farxiga  considering history of HFpEF, CAD and PAD, but he did not take it considering its side effects.  He was counseled about its potential benefits and his low risk of side effects in the last visit, but does not prefer to take medicine for it. Denies any polyuria or polyphagia.    Past Medical History:  Diagnosis Date   Aortic insufficiency    mild AI with AVSC on echo 10/2022   Cerebellar stroke (HCC) 06/24/2016   Admitted with dizziness and falling to the left 06/24/16. Work up revealed multifocal acute ischemia within the right cerebellar hemisphere with occlusion versus severe stenosis of the right PICA and extensive, multifocal intracranial atherosclerosis with moderate stenoses of multiple distal MCA branches.   Colon polyps    CVA (cerebral vascular accident) (HCC) 06/21/2016   Diabetes mellitus without complication (HCC) 11/11/2018   Hyperlipidemia    Hypertension    Myocardial infarction (HCC)    Pneumonia    Substance abuse (HCC)     hx of EtOH and MJ    Past Surgical History:  Procedure Laterality Date   CAROTID ENDARTERECTOMY Left    COLON SURGERY  [phone]   growth removed, Laparoscopic Vs. open Left hemicolectomy Dr. Devere 09-25-17   COLONOSCOPY N/A 06/26/2017   Procedure: COLONOSCOPY;  Surgeon: Golda Claudis PENNER, MD;  Location: [facility];  Service: Endoscopy;  Laterality: N/A;  730   COLONOSCOPY N/A 12/01/2018   Procedure: COLONOSCOPY;  Surgeon: Golda Claudis PENNER, MD;  Location: [facility];  Service: Endoscopy;  Laterality: N/A;  930   COLONOSCOPY WITH PROPOFOL  N/A 10/07/2017   Procedure: COLONOSCOPY WITH PROPOFOL  WITH TATTOO;  Surgeon: Teresa Lonni HERO, MD;  Location: [facility];  Service: General;  Laterality: N/A;   ENDARTERECTOMY Left 08/01/2016   Procedure: ENDARTERECTOMY LEFT CAROTID;  Surgeon: Eliza Lonni RAMAN, MD;  Location[facility];  Service: Vascular;  Laterality: Left;   FLEXIBLE SIGMOIDOSCOPY N/A 10/08/2017   Procedure: FLEXIBLE SIGMOIDOSCOPY;  Surgeon: Teresa Lonni HERO, MD;  Location: [facility];  Service: General;  Laterality: N/A;   LAPAROSCOPIC RIGHT HEMI COLECTOMY Left 10/08/2017   Procedure: LAPAROSCOPIC VERSES OPEN LEFT HEMI COLECTOMY ERAS PATHWAY;  Surgeon: Teresa Lonni HERO, MD;  Location: [facility];  Service: General;  Laterality: Left;   PATCH ANGIOPLASTY Left 08/01/2016   Procedure: PATCH ANGIOPLASTY LEFT CAROTID ARTERY USING ZENOSURE BIOLOGIC PATCH;  Surgeon: Eliza Lonni RAMAN, MD;  Location: [facility] OR;  Service: Vascular;  Laterality: Left;   POLYPECTOMY  12/01/2018   Procedure: POLYPECTOMY;  Surgeon: Golda Claudis PENNER, MD;  Location: [facility];  Service: Endoscopy;;   SUBMUCOSAL INJECTION  10/07/2017   Procedure: SUBMUCOSAL INJECTION;  Surgeon: Teresa Lonni HERO, MD;  Location: [facility];  Service: General;;   TONSILLECTOMY      Family History  Problem Relation Age of Onset   Cancer Mother    Breast cancer Mother        lived to be 60   CVA Mother    CAD  Mother    Arthritis Mother    Hypertension Mother    Other Father        gunshot wound   Early death Father        GSW   Cancer Brother        Stomach Cancer   Cancer Brother        Lung Cancer   Stroke Maternal Aunt    Alcohol abuse Maternal Uncle    Stroke Maternal Grandfather    Colon cancer Neg Hx     Social History   Socioeconomic History   Marital status: Divorced    Spouse name: Not on file   Number of children: 2   Years of education: 12   Highest education level: Not on file  Occupational History   Occupation: disabled Cytogeneticist x8 years; 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    Comment: alcoholic  Tobacco Use   Smoking status: Former    Current packs/day: 0.00    Average packs/day: 0.5 packs/day for 29.0 years (14.5 ttl pk-yrs)    Types: Cigarettes    Start date: 49    Quit date: 2006    Years since quitting: 19.6   Smokeless tobacco: Never  Vaping Use   Vaping status: Never Used  Substance and Sexual Activity   Alcohol use: Not Currently    Comment: quit 2006   Drug use: Not Currently    Comment: remote h/o heavy marijuana use, also used cocaine and others   Sexual activity: Not Currently  Other Topics Concern   Not on file  Social History Narrative   Divorced   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Electronics engineer for 8 years, EMT certified      Lives alone   Likes to walk      2 sons -in [place] and [place]   Social Drivers of Health   Financial Resource Strain: Low Risk  (03/10/2022)   Overall Financial Resource Strain (CARDIA)    Difficulty of Paying Living Expenses: Not hard at all  Food Insecurity: No Food Insecurity (03/10/2022)   Hunger Vital Sign    Worried About Running Out of Food in the Last Year: Never true    Ran Out of Food in the Last Year: Never true  Transportation Needs: No Transportation Needs (03/10/2022)   PRAPARE - Administrator, Civil Service (Medical): No    Lack of Transportation (Non-Medical): No  Physical  Activity: Sufficiently Active (03/10/2022)   Exercise Vital Sign    Days of Exercise per Week: 7 days    Minutes of Exercise per Session: 30 min  Stress: No Stress Concern Present (03/10/2022)   [employer] of Occupational Health - Occupational Stress Questionnaire    Feeling of Stress : Not at all  Social Connections: Moderately Isolated (03/10/2022)   Social Connection and Isolation Panel    Frequency of Communication with Friends and Family: More than three times a  week    Frequency of Social Gatherings with Friends and Family: More than three times a week    Attends Religious Services: More than 4 times per year    Active Member of [employer] or Organizations: No    Attends Banker Meetings: Never    Marital Status: Divorced  Catering manager Violence: Not At Risk (03/10/2022)   Humiliation, Afraid, Rape, and Kick questionnaire    Fear of Current or Ex-Partner: No    Emotionally Abused: No    Physically Abused: No    Sexually Abused: No    Outpatient Medications Prior to Visit  Medication Sig Dispense Refill   aspirin  EC 81 MG tablet Take 1 tablet (81 mg total) by mouth daily. 90 tablet 3   atorvastatin  (LIPITOR) 80 MG tablet TAKE 1 TABLET(80 MG) BY MOUTH DAILY 90 tablet 3   carvedilol  (COREG ) 6.25 MG tablet Take 1 tablet (6.25 mg total) by mouth 2 (two) times daily with a meal. 180 tablet 1   Cholecalciferol (VITAMIN D ) 50 MCG (2000 UT) CAPS Take 1 capsule by mouth daily.     clopidogrel  (PLAVIX ) 75 MG tablet TAKE 1 TABLET(75 MG) BY MOUTH DAILY 90 tablet 3   colchicine  0.6 MG tablet TAKE 2 TABLETS BY MOUTH ONCE, THEN TAKE 1 TABLET ONE HOUR LATER. TAKE 1 TABLET DAILY FROM DAY 2. 20 tablet 0   hydrALAZINE  (APRESOLINE ) 50 MG tablet Take 1 tablet (50 mg total) by mouth 2 (two) times daily. 180 tablet 1   lisinopril  (ZESTRIL ) 40 MG tablet Take 1 tablet (40 mg total) by mouth daily. 90 tablet 3   Vitamin D , Ergocalciferol , 50 MCG (2000 UT) CAPS Take 1 capsule by mouth daily.      No facility-administered medications prior to visit.    Allergies  Allergen Reactions   Blood-Group Specific Substance     No whole blood products   Diclofenac  Sodium-Menthol Gel [Diclofenac  Sodium-Menthol Crm]     Shortness of breath   Prednisone      Elevates blood pressure    ROS Review of Systems  Constitutional:  Negative for chills and fever.  HENT:  Negative for congestion and sore throat.   Eyes:  Negative for pain and discharge.  Respiratory:  Negative for cough and shortness of breath.   Cardiovascular:  Negative for chest pain and palpitations.  Gastrointestinal:  Negative for constipation, diarrhea, nausea and vomiting.  Endocrine: Negative for polydipsia and polyuria.  Genitourinary:  Negative for dysuria and hematuria.  Musculoskeletal:  Negative for neck pain and neck stiffness.  Skin:  Negative for rash.  Neurological:  Negative for dizziness, weakness, numbness and headaches.  Psychiatric/Behavioral:  Negative for agitation and behavioral problems.       Objective:    Physical Exam Vitals reviewed.  Constitutional:      General: He is not in acute distress.    Appearance: He is not diaphoretic.  HENT:     Head: Normocephalic and atraumatic.     Nose: Nose normal.     Mouth/Throat:     Mouth: Mucous membranes are moist.  Eyes:     General: No scleral icterus.    Extraocular Movements: Extraocular movements intact.  Cardiovascular:     Rate and Rhythm: Normal rate and regular rhythm.     Heart sounds: Normal heart sounds. No murmur heard. Pulmonary:     Breath sounds: Normal breath sounds. No wheezing or rales.  Abdominal:     Palpations: Abdomen is soft.  Tenderness: There is no abdominal tenderness.  Musculoskeletal:     Cervical back: Neck supple. No tenderness.     Right lower leg: No edema.     Left lower leg: No edema.  Skin:    General: Skin is warm.     Findings: Lesion (Has skin tag over neck area) present. No rash.   Neurological:     General: No focal deficit present.     Mental Status: He is alert and oriented to person, place, and time.     Cranial Nerves: No cranial nerve deficit.     Sensory: No sensory deficit.     Motor: No weakness.  Psychiatric:        Mood and Affect: Mood normal.        Behavior: Behavior normal.     BP 118/70   Pulse 75   Ht 6' 3 (1.905 m)   Wt 198 lb 12.8 oz (90.2 kg)   SpO2 97%   BMI 24.85 kg/m  Wt Readings from Last 3 Encounters:  09/30/23 198 lb 12.8 oz (90.2 kg)  05/25/23 199 lb (90.3 kg)  05/12/23 200 lb (90.7 kg)    Lab Results  Component Value Date   TSH 1.490 09/04/2021   Lab Results  Component Value Date   WBC 5.5 09/22/2023   HGB 12.1 (L) 09/22/2023   HCT 35.6 (L) 09/22/2023   MCV 89 09/22/2023   PLT 261 09/22/2023   Lab Results  Component Value Date   NA 133 (L) 09/22/2023   K 4.3 09/22/2023   CO2 22 09/22/2023   GLUCOSE 92 09/22/2023   BUN 25 09/22/2023   CREATININE 1.43 (H) 09/22/2023   BILITOT 0.5 09/22/2023   ALKPHOS 73 09/22/2023   AST 29 09/22/2023   ALT 24 09/22/2023   PROT 7.0 09/22/2023   ALBUMIN  4.2 09/22/2023   CALCIUM  9.5 09/22/2023   ANIONGAP 11 10/12/2017   EGFR 51 (L) 09/22/2023   Lab Results  Component Value Date   CHOL 137 09/22/2023   Lab Results  Component Value Date   HDL 43 09/22/2023   Lab Results  Component Value Date   LDLCALC 83 09/22/2023   Lab Results  Component Value Date   TRIG 52 09/22/2023   Lab Results  Component Value Date   CHOLHDL 3.2 09/22/2023   Lab Results  Component Value Date   HGBA1C 6.4 (H) 09/22/2023      Assessment & Plan:   Problem List Items Addressed This Visit       Cardiovascular and Mediastinum   Stenosis of carotid artery   Followed by Vascular surgery, has had left carotid endarterectomy On DAPT and statin      Essential hypertension   BP Readings from Last 1 Encounters:  09/30/23 118/70   Well-controlled now On lisinopril  40 mg once  daily, Coreg  6.25 mg BID and Hydralazine  50 mg BID Due to recurrent episodes of gout, discontinued HCTZ Counseled for compliance with the medications Advised DASH diet and moderate exercise/walking as tolerated      Chronic heart failure with preserved ejection fraction (HCC)   Last Echo reviewed On ACEi and B-blocker Had added Farxiga , he did not take it as he was concerned about its side effects, after counseling he still denied to start taking it        Endocrine   Type 2 diabetes mellitus with other specified complication (HCC)   Lab Results  Component Value Date   HGBA1C 6.4 (H) 09/22/2023  Diet controlled Associated with HTN, HLD, PAD, CKD and CHF Had added Farxiga  for HFpEF and CKD, but did not take it despite counseling Advised to follow diabetic diet On statin and ACEi F/u CMP and lipid panel Diabetic eye exam: Advised to follow up with Ophthalmology for diabetic eye exam      Relevant Orders   Basic Metabolic Panel (BMET)   Hemoglobin A1c     Genitourinary   Stage 3a chronic kidney disease (HCC)   CMP reviewed, GFR stays around 55 Likely from HTN, DM and age related decline On lisinopril  for HTN Had added Farxiga  for CKD and HFpEF, but did not take it due to concern for side effects Avoid nephrotoxic agents Advised to improve fluid intake to at least 64 ounces of fluid in a day      Relevant Orders   Basic Metabolic Panel (BMET)   CBC with Differential/Platelet     Other   Prostate cancer screening   Ordered PSA after discussing its limitations for prostate cancer screening, including false positive results leading to additional investigations.      Relevant Orders   PSA   Encounter for general adult medical examination with abnormal findings - Primary   Physical exam as documented. Counseling done  re healthy lifestyle involving commitment to 150 minutes exercise per week, heart healthy diet, and attaining healthy weight.The importance of adequate  sleep also discussed. Immunization and cancer screening needs are specifically addressed at this visit.  Denies pneumococcal vaccine. Advised to get Shingrix and Tdap vaccines at local pharmacy.         No orders of the defined types were placed in this encounter.   Follow-up: Return in about 4 months (around 01/30/2024) for DM and HTN.    Suzzane MARLA Blanch, MD

## 2023-09-30 NOTE — Assessment & Plan Note (Signed)
 Lab Results  Component Value Date   HGBA1C 6.4 (H) 09/22/2023   Diet controlled Associated with HTN, HLD, PAD, CKD and CHF Had added Farxiga  for HFpEF and CKD, but did not take it despite counseling Advised to follow diabetic diet On statin and ACEi F/u CMP and lipid panel Diabetic eye exam: Advised to follow up with Ophthalmology for diabetic eye exam

## 2023-10-23 ENCOUNTER — Encounter: Payer: Self-pay | Admitting: Podiatry

## 2023-10-23 ENCOUNTER — Ambulatory Visit: Admitting: Podiatry

## 2023-10-23 DIAGNOSIS — B351 Tinea unguium: Secondary | ICD-10-CM | POA: Diagnosis not present

## 2023-10-23 DIAGNOSIS — M79675 Pain in left toe(s): Secondary | ICD-10-CM | POA: Diagnosis not present

## 2023-10-23 DIAGNOSIS — E119 Type 2 diabetes mellitus without complications: Secondary | ICD-10-CM

## 2023-10-23 DIAGNOSIS — N1831 Chronic kidney disease, stage 3a: Secondary | ICD-10-CM | POA: Diagnosis not present

## 2023-10-23 DIAGNOSIS — M79674 Pain in right toe(s): Secondary | ICD-10-CM | POA: Diagnosis not present

## 2023-10-23 NOTE — Progress Notes (Addendum)
 This patient returns to my office for at risk foot care.  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  Patient is taking plavix .  This patient is unable to cut nails himself since the patient cannot reach his nails.These nails are painful walking and wearing shoes.  Patient says his vascular status is being evaluated.This patient presents for at risk foot care today.  General Appearance  Alert, conversant and in no acute stress.  Vascular  Dorsalis pedis and posterior tibial  pulses are weakly  palpable  bilaterally.  Capillary return is within normal limits  bilaterally. Temperature is within normal limits  bilaterally.  Neurologic  Senn-Weinstein monofilament wire test within normal limits/diminished   bilaterally. Muscle power within normal limits bilaterally.  Nails Thick disfigured discolored nails with subungual debris  from hallux to fifth toes bilaterally. No evidence of bacterial infection or drainage bilaterally.  Orthopedic  No limitations of motion  feet .  No crepitus or effusions noted.  No bony pathology or digital deformities noted. HAV  B/L.  Skin  normotropic skin with no porokeratosis noted bilaterally.  No signs of infections or ulcers noted.   Asymptomatic thickness of skin sub 5th met left foot.  Onychomycosis  Pain in right toes  Pain in left toes  Consent was obtained for treatment procedures.   Mechanical debridement of nails 1-5  bilaterally performed with a nail nipper.  Filed with dremel without incident.    Return office visit   3 months                  Told patient to return for periodic foot care and evaluation due to potential at risk complications.   Cordella Bold DPM tomma

## 2023-10-29 ENCOUNTER — Encounter (INDEPENDENT_AMBULATORY_CARE_PROVIDER_SITE_OTHER): Payer: Self-pay | Admitting: *Deleted

## 2023-11-11 ENCOUNTER — Telehealth (INDEPENDENT_AMBULATORY_CARE_PROVIDER_SITE_OTHER): Payer: Self-pay

## 2023-11-11 NOTE — Telephone Encounter (Signed)
 Who is your primary care physician: Rutwik K. Patel  Reasons for the colonoscopy: history of colon polyps  Have you had a colonoscopy before?  Yes, 2020  Do you have family history of colon cancer? no  Previous colonoscopy with polyps removed? yes  Do you have a history colorectal cancer?   no  Are you diabetic? If yes, Type 1 or Type 2?    no  Do you have a prosthetic or mechanical heart valve? no  Do you have a pacemaker/defibrillator?   no  Have you had endocarditis/atrial fibrillation? no  Have you had joint replacement within the last 12 months?  no  Do you tend to be constipated or have to use laxatives? no  Do you have any history of drugs or alchohol?  no  Do you use supplemental oxygen?  no  Have you had a stroke or heart attack within the last 6 months? no  Do you take weight loss medication?  no  Do you take any blood-thinning medications such as: (aspirin , warfarin, Plavix , Aggrenox)  yes  If yes we need the name, milligram, dosage and who is prescribing doctor Plavix  75mg  once daily, Dr. Tobie Current Outpatient Medications on File Prior to Visit  Medication Sig Dispense Refill   aspirin  EC 81 MG tablet Take 1 tablet (81 mg total) by mouth daily. 90 tablet 3   atorvastatin  (LIPITOR) 80 MG tablet TAKE 1 TABLET(80 MG) BY MOUTH DAILY 90 tablet 3   carvedilol  (COREG ) 6.25 MG tablet Take 1 tablet (6.25 mg total) by mouth 2 (two) times daily with a meal. 180 tablet 1   Cholecalciferol (VITAMIN D ) 50 MCG (2000 UT) CAPS Take 1 capsule by mouth daily.     clopidogrel  (PLAVIX ) 75 MG tablet TAKE 1 TABLET(75 MG) BY MOUTH DAILY 90 tablet 3   colchicine  0.6 MG tablet TAKE 2 TABLETS BY MOUTH ONCE, THEN TAKE 1 TABLET ONE HOUR LATER. TAKE 1 TABLET DAILY FROM DAY 2. 20 tablet 0   lisinopril  (ZESTRIL ) 40 MG tablet Take 1 tablet (40 mg total) by mouth daily. 90 tablet 3   Vitamin D , Ergocalciferol , 50 MCG (2000 UT) CAPS Take 1 capsule by mouth daily.     No current  facility-administered medications on file prior to visit.    Allergies  Allergen Reactions   Blood-Group Specific Substance     No whole blood products   Diclofenac  Sodium-Menthol Gel [Diclofenac  Sodium-Menthol Crm]     Shortness of breath   Prednisone      Elevates blood pressure     Pharmacy: [facility], [address] [place]  Primary Insurance Name: Mylene  Best number where you can be reached: [phone]

## 2023-11-11 NOTE — Telephone Encounter (Signed)
 Any room, need clearance to hold Plavix  Thanks

## 2023-11-11 NOTE — Telephone Encounter (Signed)
    11/11/23  Prentiss LITTIE Slicker February 20, 1950  What type of surgery is being performed? colonoscopy  When is surgery scheduled? To be determined  What type of clearance is required (medical or pharmacy to hold medication or both? medication  Are there any medications that need to be held prior to surgery and how long? Hold PLAVIX  5 days prior to procedure.  Name of physician performing surgery?  Dr. Eartha Rouse [facility] Phone: [phone] Fax: [phone]  Anethesia type (none, local, MAC, general)? MAC     ? Yes ? No Patient can hold medication as requested   Signature: ___________________________

## 2023-11-11 NOTE — Telephone Encounter (Signed)
 Medication clearance for Plavix  sent to PCP.

## 2023-11-11 NOTE — Telephone Encounter (Signed)
 ATC pt to scheduled TCS, no answer. LVM for call back.

## 2023-11-12 MED ORDER — PEG 3350-KCL-NA BICARB-NACL 420 G PO SOLR: 4000.0000 mL | Freq: Once | ORAL | 0 refills | Status: AC

## 2023-11-12 NOTE — Telephone Encounter (Signed)
 Spoke with pt, scheduled TCS for 11/27/2023 at 9:45. Rx sent to pharmacy, instructions mailed.

## 2023-11-12 NOTE — Addendum Note (Signed)
 Addended by: DALLIE LIONEL RAMAN on: 11/12/2023 09:50 AM   Modules accepted: Orders

## 2023-11-12 NOTE — Telephone Encounter (Signed)
 Questionnaire from recall, no referral needed

## 2023-11-16 ENCOUNTER — Telehealth: Payer: Self-pay

## 2023-11-16 ENCOUNTER — Other Ambulatory Visit: Payer: Self-pay | Admitting: Internal Medicine

## 2023-11-16 DIAGNOSIS — M10061 Idiopathic gout, right knee: Secondary | ICD-10-CM

## 2023-11-16 MED ORDER — COLCHICINE 0.6 MG PO TABS: ORAL_TABLET | ORAL | 0 refills | Status: AC

## 2023-11-16 NOTE — Telephone Encounter (Signed)
 Prescription Request  11/16/2023  LOV: Visit date not found  What is the name of the medication or equipment? colchicine  0.6 MG tablet   Have you contacted your pharmacy to request a refill? Yes   Which pharmacy would you like this sent to?    [facility]   Patient notified that their request is being sent to the clinical staff for review and that they should receive a response within 2 business days.   Please advise at Mobile [phone] (mobile)

## 2023-11-27 ENCOUNTER — Ambulatory Visit (HOSPITAL_COMMUNITY): Payer: Self-pay | Admitting: Certified Registered Nurse Anesthetist

## 2023-11-27 ENCOUNTER — Ambulatory Visit (HOSPITAL_COMMUNITY)
Admission: RE | Admit: 2023-11-27 | Discharge: 2023-11-27 | Disposition: A | Discharge status: Home / Self Care | Attending: Gastroenterology | Admitting: Gastroenterology

## 2023-11-27 ENCOUNTER — Encounter (HOSPITAL_COMMUNITY): Payer: Self-pay | Admitting: Gastroenterology

## 2023-11-27 ENCOUNTER — Other Ambulatory Visit: Payer: Self-pay

## 2023-11-27 ENCOUNTER — Encounter (HOSPITAL_COMMUNITY): Payer: Self-pay | Admitting: Certified Registered Nurse Anesthetist

## 2023-11-27 ENCOUNTER — Encounter (HOSPITAL_COMMUNITY)
Admission: RE | Disposition: A | Discharge status: Home / Self Care | Payer: Self-pay | Source: Home / Self Care | Attending: Gastroenterology

## 2023-11-27 DIAGNOSIS — Z8673 Personal history of transient ischemic attack (TIA), and cerebral infarction without residual deficits: Secondary | ICD-10-CM | POA: Insufficient documentation

## 2023-11-27 DIAGNOSIS — Z980 Intestinal bypass and anastomosis status: Secondary | ICD-10-CM | POA: Diagnosis not present

## 2023-11-27 DIAGNOSIS — Z860101 Personal history of adenomatous and serrated colon polyps: Secondary | ICD-10-CM

## 2023-11-27 DIAGNOSIS — I10 Essential (primary) hypertension: Secondary | ICD-10-CM | POA: Insufficient documentation

## 2023-11-27 DIAGNOSIS — E785 Hyperlipidemia, unspecified: Secondary | ICD-10-CM | POA: Diagnosis not present

## 2023-11-27 DIAGNOSIS — Z860100 Personal history of colon polyps, unspecified: Secondary | ICD-10-CM

## 2023-11-27 DIAGNOSIS — I252 Old myocardial infarction: Secondary | ICD-10-CM | POA: Diagnosis not present

## 2023-11-27 DIAGNOSIS — K5730 Diverticulosis of large intestine without perforation or abscess without bleeding: Secondary | ICD-10-CM

## 2023-11-27 DIAGNOSIS — K635 Polyp of colon: Secondary | ICD-10-CM | POA: Diagnosis not present

## 2023-11-27 DIAGNOSIS — Z87891 Personal history of nicotine dependence: Secondary | ICD-10-CM | POA: Insufficient documentation

## 2023-11-27 DIAGNOSIS — D123 Benign neoplasm of transverse colon: Secondary | ICD-10-CM | POA: Diagnosis not present

## 2023-11-27 DIAGNOSIS — Z1211 Encounter for screening for malignant neoplasm of colon: Secondary | ICD-10-CM | POA: Diagnosis not present

## 2023-11-27 DIAGNOSIS — E119 Type 2 diabetes mellitus without complications: Secondary | ICD-10-CM | POA: Diagnosis not present

## 2023-11-27 DIAGNOSIS — I351 Nonrheumatic aortic (valve) insufficiency: Secondary | ICD-10-CM | POA: Insufficient documentation

## 2023-11-27 DIAGNOSIS — E1151 Type 2 diabetes mellitus with diabetic peripheral angiopathy without gangrene: Secondary | ICD-10-CM | POA: Insufficient documentation

## 2023-11-27 DIAGNOSIS — I25119 Atherosclerotic heart disease of native coronary artery with unspecified angina pectoris: Secondary | ICD-10-CM | POA: Diagnosis not present

## 2023-11-27 HISTORY — PX: COLONOSCOPY: SHX5424

## 2023-11-27 LAB — HM COLONOSCOPY

## 2023-11-27 SURGERY — COLONOSCOPY
Anesthesia: General

## 2023-11-27 MED ORDER — LACTATED RINGERS IV SOLN
INTRAVENOUS | Status: DC
Start: 2023-11-27 — End: 2023-11-27

## 2023-11-27 MED ORDER — HYDRALAZINE HCL 20 MG/ML IJ SOLN
2.0000 mg | Freq: Once | INTRAMUSCULAR | Status: AC
  Administered 2023-11-27: 2 mg via INTRAVENOUS

## 2023-11-27 MED ORDER — PROPOFOL 500 MG/50ML IV EMUL
INTRAVENOUS | Status: AC
  Filled 2023-11-27: qty 50

## 2023-11-27 MED ORDER — PROPOFOL 500 MG/50ML IV EMUL
INTRAVENOUS | Status: DC | PRN
  Administered 2023-11-27: 60 mg via INTRAVENOUS
  Administered 2023-11-27: 140 ug/kg/min via INTRAVENOUS

## 2023-11-27 MED ORDER — HYDRALAZINE HCL 20 MG/ML IJ SOLN
INTRAMUSCULAR | Status: AC
  Filled 2023-11-27: qty 1

## 2023-11-27 NOTE — Transfer of Care (Signed)
 Immediate Anesthesia Transfer of Care Note  Patient: Albert Morris  Procedure(s) Performed: COLONOSCOPY  Patient Location: Endoscopy Unit  Anesthesia Type:General  Level of Consciousness: awake, alert , and oriented  Airway & Oxygen Therapy: Patient Spontanous Breathing  Post-op Assessment: Report given to RN, Post -op Vital signs reviewed and stable, Patient moving all extremities X 4, and Patient able to stick tongue midline  Post vital signs: Reviewed and stable  Last Vitals:  Vitals Value Taken Time  BP 116/83   Temp 97.8   Pulse 75   Resp 15   SpO2 95     Last Pain:  Vitals:   11/27/23 0958  TempSrc:   PainSc: 0-No pain      Patients Stated Pain Goal: 6 (11/27/23 0820)  Complications: No notable events documented.

## 2023-11-27 NOTE — Op Note (Signed)
 [facility] Patient Name: Albert Morris Procedure Date: 11/27/2023 9:46 AM MRN: [id number] Date of Birth: [birth year] Attending MD: Toribio Fortune , , [id number] CSN: [id number] Age: 73 Admit Type: Outpatient Procedure:                Colonoscopy Indications:              Surveillance: Personal history of adenomatous                            polyps on last colonoscopy 5 years ago Providers:                Toribio Fortune, Leandrew Edelman RN, RN, [place] Wilson,                            Technician Referring MD:             Toribio Fortune Medicines:                Monitored Anesthesia Care Complications:            No immediate complications. Estimated Blood Loss:     Estimated blood loss: none. Procedure:                Pre-Anesthesia Assessment:                           - Prior to the procedure, a History and Physical                            was performed, and patient medications, allergies                            and sensitivities were reviewed. The patient's                            tolerance of previous anesthesia was reviewed.                           - The risks and benefits of the procedure and the                            sedation options and risks were discussed with the                            patient. All questions were answered and informed                            consent was obtained.                           - ASA Grade Assessment: III - A patient with severe                            systemic disease.                           After obtaining informed consent, the colonoscope  was passed under direct vision. Throughout the                            procedure, the patient's blood pressure, pulse, and                            oxygen saturations were monitored continuously. The                            [id number]-[id number] ([id number]) Peds Colon was introduced                            through the anus and advanced to the  the cecum,                            identified by appendiceal orifice and ileocecal                            valve. The colonoscopy was performed without                            difficulty. The patient tolerated the procedure                            well. The quality of the bowel preparation was good. Scope In: 10:02:28 AM Scope Out: 10:14:38 AM Scope Withdrawal Time: 0 hours 10 minutes 7 seconds  Total Procedure Duration: 0 hours 12 minutes 10 seconds  Findings:      The perianal and digital rectal examinations were normal.      A 3 mm polyp was found in the transverse colon. The polyp was sessile.       The polyp was removed with a cold snare. Resection and retrieval were       complete.      Scattered small-mouthed diverticula were found in the sigmoid colon and       descending colon.      There was evidence of a prior end-to-end colo-colonic anastomosis in the       sigmoid colon. This was patent and was characterized by healthy       appearing mucosa. The anastomosis was traversed.      The retroflexed view of the distal rectum and anal verge was normal and       showed no anal or rectal abnormalities. Impression:               - One 3 mm polyp in the transverse colon, removed                            with a cold snare. Resected and retrieved.                           - Diverticulosis in the sigmoid colon and in the                            descending colon.                           -  Patent end-to-end colo-colonic anastomosis,                            characterized by healthy appearing mucosa.                           - The distal rectum and anal verge are normal on                            retroflexion view. Moderate Sedation:      Per Anesthesia Care Recommendation:           - Discharge patient to home (ambulatory).                           - Resume previous diet.                           - Await pathology results.                           - Repeat  colonoscopy in 5 years for surveillance. Procedure Code(s):        --- Professional ---                           [phone], Colonoscopy, flexible; with removal of                            tumor(s), polyp(s), or other lesion(s) by snare                            technique Diagnosis Code(s):        --- Professional ---                           Z86.010, Personal history of colonic polyps                           D12.3, Benign neoplasm of transverse colon (hepatic                            flexure or splenic flexure)                           Z98.0, Intestinal bypass and anastomosis status                           K57.30, Diverticulosis of large intestine without                            perforation or abscess without bleeding CPT copyright 2022 [employer] Association. All rights reserved. The codes documented in this report are preliminary and upon coder review may  be revised to meet current compliance requirements. Toribio Fortune, MD Toribio Fortune,  11/27/2023 10:27:13 AM This report has been signed electronically. Number of Addenda: 0

## 2023-11-27 NOTE — H&P (Signed)
 Albert Morris is an 73 y.o. male.   Chief Complaint: History of colon polyps HPI: 73 year old male with past medical history of CVA, diabetes, hyperlipidemia, hypertension, myocardial infarction, alcohol abuse, arctic insufficiency, coming for history of colon polyps.  Patient had a tubulovillous adenoma measuring 4 cm in 2019 that had to be removed surgically.  Last colonoscopy in 2020 only had 1 polyp.  The patient denies having any complaints such as melena, hematochezia, abdominal pain or distention, change in her bowel movement consistency or frequency, no changes in weight recently.  No family history of colorectal cancer.   Past Medical History:  Diagnosis Date   Aortic insufficiency    mild AI with AVSC on echo 10/2022   Cerebellar stroke (HCC) 06/24/2016   Admitted with dizziness and falling to the left 06/24/16. Work up revealed multifocal acute ischemia within the right cerebellar hemisphere with occlusion versus severe stenosis of the right PICA and extensive, multifocal intracranial atherosclerosis with moderate stenoses of multiple distal MCA branches.   Colon polyps    CVA (cerebral vascular accident) (HCC) 06/21/2016   Diabetes mellitus without complication (HCC) 11/11/2018   Hyperlipidemia    Hypertension    Myocardial infarction (HCC)    Pneumonia    Substance abuse (HCC)    hx of EtOH and MJ    Past Surgical History:  Procedure Laterality Date   CAROTID ENDARTERECTOMY Left    COLON SURGERY  [phone]   growth removed, Laparoscopic Vs. open Left hemicolectomy Dr. Devere 09-25-17   COLONOSCOPY N/A 06/26/2017   Procedure: COLONOSCOPY;  Surgeon: Golda Claudis PENNER, MD;  Location: [facility];  Service: Endoscopy;  Laterality: N/A;  730   COLONOSCOPY N/A 12/01/2018   Procedure: COLONOSCOPY;  Surgeon: Golda Claudis PENNER, MD;  Location: [facility];  Service: Endoscopy;  Laterality: N/A;  930   COLONOSCOPY WITH PROPOFOL  N/A 10/07/2017   Procedure: COLONOSCOPY WITH  PROPOFOL  WITH TATTOO;  Surgeon: Teresa Lonni HERO, MD;  Location: [facility];  Service: General;  Laterality: N/A;   ENDARTERECTOMY Left 08/01/2016   Procedure: ENDARTERECTOMY LEFT CAROTID;  Surgeon: Eliza Lonni RAMAN, MD;  Location[facility];  Service: Vascular;  Laterality: Left;   FLEXIBLE SIGMOIDOSCOPY N/A 10/08/2017   Procedure: FLEXIBLE SIGMOIDOSCOPY;  Surgeon: Teresa Lonni HERO, MD;  Location: [facility];  Service: General;  Laterality: N/A;   LAPAROSCOPIC RIGHT HEMI COLECTOMY Left 10/08/2017   Procedure: LAPAROSCOPIC VERSES OPEN LEFT HEMI COLECTOMY ERAS PATHWAY;  Surgeon: Teresa Lonni HERO, MD;  Location: [facility];  Service: General;  Laterality: Left;   PATCH ANGIOPLASTY Left 08/01/2016   Procedure: PATCH ANGIOPLASTY LEFT CAROTID ARTERY USING ZENOSURE BIOLOGIC PATCH;  Surgeon: Eliza Lonni RAMAN, MD;  Location: [facility] OR;  Service: Vascular;  Laterality: Left;   POLYPECTOMY  12/01/2018   Procedure: POLYPECTOMY;  Surgeon: Golda Claudis PENNER, MD;  Location: [facility];  Service: Endoscopy;;   SUBMUCOSAL INJECTION  10/07/2017   Procedure: SUBMUCOSAL INJECTION;  Surgeon: Teresa Lonni HERO, MD;  Location: [facility];  Service: General;;   TONSILLECTOMY      Family History  Problem Relation Age of Onset   Cancer Mother    Breast cancer Mother        lived to be 60   CVA Mother    CAD Mother    Arthritis Mother    Hypertension Mother    Other Father        gunshot wound   Early death Father        GSW  Cancer Brother        Stomach Cancer   Cancer Brother        Lung Cancer   Stroke Maternal Aunt    Alcohol abuse Maternal Uncle    Stroke Maternal Grandfather    Colon cancer Neg Hx    Social History:  reports that he quit smoking about 19 years ago. His smoking use included cigarettes. He started smoking about 48 years ago. He has a 14.5 pack-year smoking history. He has never used smokeless tobacco. He reports that he does not currently use alcohol. He reports that  he does not currently use drugs.  Allergies:  Allergies  Allergen Reactions   Blood-Group Specific Substance     No whole blood products   Diclofenac  Sodium-Menthol Gel [Diclofenac  Sodium-Menthol Crm]     Shortness of breath   Prednisone      Elevates blood pressure    Medications Prior to Admission  Medication Sig Dispense Refill   atorvastatin  (LIPITOR) 80 MG tablet TAKE 1 TABLET(80 MG) BY MOUTH DAILY 90 tablet 3   carvedilol  (COREG ) 6.25 MG tablet Take 1 tablet (6.25 mg total) by mouth 2 (two) times daily with a meal. 180 tablet 1   lisinopril  (ZESTRIL ) 40 MG tablet Take 1 tablet (40 mg total) by mouth daily. 90 tablet 3   Vitamin D , Ergocalciferol , 50 MCG (2000 UT) CAPS Take 1 capsule by mouth daily.     aspirin  EC 81 MG tablet Take 1 tablet (81 mg total) by mouth daily. 90 tablet 3   Cholecalciferol (VITAMIN D ) 50 MCG (2000 UT) CAPS Take 1 capsule by mouth daily. (Patient not taking: Reported on 11/27/2023)     clopidogrel  (PLAVIX ) 75 MG tablet TAKE 1 TABLET(75 MG) BY MOUTH DAILY 90 tablet 3   colchicine  0.6 MG tablet TAKE 2 TABLETS BY MOUTH ONCE, THEN TAKE 1 TABLET ONE HOUR LATER. TAKE 1 TABLET DAILY FROM DAY 2. 20 tablet 0    No results found for this or any previous visit (from the past 48 hours). No results found.  Review of Systems  All other systems reviewed and are negative.   Blood pressure (!) 209/98, pulse 66, temperature 98.6 F (37 C), temperature source Oral, resp. rate 15, height 6' 3 (1.905 m), weight 90.7 kg, SpO2 98%. Physical Exam  GENERAL: The patient is AO x3, in no acute distress. HEENT: Head is normocephalic and atraumatic. EOMI are intact. Mouth is well hydrated and without lesions. NECK: Supple. No masses LUNGS: Clear to auscultation. No presence of rhonchi/wheezing/rales. Adequate chest expansion HEART: RRR, normal s1 and s2. ABDOMEN: Soft, nontender, no guarding, no peritoneal signs, and nondistended. BS +. No masses. EXTREMITIES: Without any  cyanosis, clubbing, rash, lesions or edema. NEUROLOGIC: AOx3, no focal motor deficit. SKIN: no jaundice, no rashes  Assessment/Plan 73 year old male with past medical history of CVA, diabetes, hyperlipidemia, hypertension, myocardial infarction, alcohol abuse, arctic insufficiency, coming for history of colon polyps.  Will proceed with colonoscopy.  Toribio Eartha Flavors, MD 11/27/2023, 8:55 AM

## 2023-11-27 NOTE — Progress Notes (Signed)
 NO whole blood products; Agrees to albumin 

## 2023-11-27 NOTE — Discharge Instructions (Signed)
 You are being discharged to home.  Resume your previous diet.  We are waiting for your pathology results.  Your physician has recommended a repeat colonoscopy in five years for surveillance.  Restart Plavix  tonight.

## 2023-11-27 NOTE — Anesthesia Postprocedure Evaluation (Signed)
 Anesthesia Post Note  Patient: Albert Morris  Procedure(s) Performed: COLONOSCOPY  Patient location during evaluation: PACU Anesthesia Type: General Level of consciousness: awake and alert Pain management: pain level controlled Vital Signs Assessment: post-procedure vital signs reviewed and stable Respiratory status: spontaneous breathing, nonlabored ventilation and respiratory function stable Cardiovascular status: blood pressure returned to baseline and stable Postop Assessment: no apparent nausea or vomiting Anesthetic complications: no   No notable events documented.   Last Vitals:  Vitals:   11/27/23 0954 11/27/23 1021  BP: (!) 174/85 118/60  Pulse: (!) 59 63  Resp: 16 19  Temp:  36.7 C  SpO2: 98% 95%    Last Pain:  Vitals:   11/27/23 1021  TempSrc: Oral  PainSc: 0-No pain                 Andrea Limes

## 2023-11-27 NOTE — Anesthesia Preprocedure Evaluation (Addendum)
 Anesthesia Evaluation  Patient identified by MRN, date of birth, ID band Patient awake    Reviewed: Allergy & Precautions, H&P , NPO status , Patient's Chart, lab work & pertinent test results  Airway Mallampati: II  TM Distance: >3 FB Neck ROM: Full    Dental  (+) Missing   Pulmonary pneumonia, former smoker   Pulmonary exam normal breath sounds clear to auscultation       Cardiovascular hypertension, + angina  + CAD, + Past MI and + Peripheral Vascular Disease  Normal cardiovascular exam Rhythm:Regular Rate:Normal     Neuro/Psych  PSYCHIATRIC DISORDERS      CVA    GI/Hepatic negative GI ROS, Neg liver ROS,,,  Endo/Other  diabetes    Renal/GU Renal disease  negative genitourinary   Musculoskeletal negative musculoskeletal ROS (+)    Abdominal   Peds negative pediatric ROS (+)  Hematology  (+) Blood dyscrasia, anemia   Anesthesia Other Findings   Reproductive/Obstetrics negative OB ROS                              Anesthesia Physical Anesthesia Plan  ASA: 2  Anesthesia Plan: General   Post-op Pain Management:    Induction: Intravenous  PONV Risk Score and Plan:   Airway Management Planned: Nasal Cannula  Additional Equipment:   Intra-op Plan:   Post-operative Plan:   Informed Consent: I have reviewed the patients History and Physical, chart, labs and discussed the procedure including the risks, benefits and alternatives for the proposed anesthesia with the patient or authorized representative who has indicated his/her understanding and acceptance.     Dental advisory given  Plan Discussed with: CRNA  Anesthesia Plan Comments:          Anesthesia Quick Evaluation

## 2023-11-30 ENCOUNTER — Encounter (HOSPITAL_COMMUNITY): Payer: Self-pay | Admitting: Gastroenterology

## 2023-11-30 LAB — GLUCOSE, CAPILLARY: Glucose-Capillary: 84 mg/dL (ref 70–99)

## 2023-12-01 ENCOUNTER — Ambulatory Visit (INDEPENDENT_AMBULATORY_CARE_PROVIDER_SITE_OTHER): Payer: Self-pay | Admitting: Gastroenterology

## 2023-12-01 LAB — SURGICAL PATHOLOGY

## 2023-12-02 ENCOUNTER — Encounter (INDEPENDENT_AMBULATORY_CARE_PROVIDER_SITE_OTHER): Payer: Self-pay | Admitting: *Deleted

## 2023-12-04 NOTE — Progress Notes (Signed)
 5 yr TCS noted in recall Patient result letter mailed Patient's PCP is on EPIC

## 2023-12-24 ENCOUNTER — Encounter (INDEPENDENT_AMBULATORY_CARE_PROVIDER_SITE_OTHER): Payer: Self-pay | Admitting: Gastroenterology

## 2023-12-31 ENCOUNTER — Telehealth: Payer: Self-pay | Admitting: Pharmacist

## 2023-12-31 ENCOUNTER — Other Ambulatory Visit: Payer: Self-pay | Admitting: Internal Medicine

## 2023-12-31 DIAGNOSIS — E782 Mixed hyperlipidemia: Secondary | ICD-10-CM

## 2023-12-31 DIAGNOSIS — Z8673 Personal history of transient ischemic attack (TIA), and cerebral infarction without residual deficits: Secondary | ICD-10-CM

## 2023-12-31 MED ORDER — ATORVASTATIN CALCIUM 80 MG PO TABS: 80.0000 mg | ORAL_TABLET | Freq: Every day | ORAL | 3 refills | Status: AC

## 2023-12-31 NOTE — Progress Notes (Unsigned)
 Pharmacy Quality Measure Review  This patient is appearing on the insurance-providing list for being at risk of failing the adherence measure for Statin Therapy for Patients with Cardiovascular Disease [facility]) medications this calendar year.   Medication: atorvastatin  80 mg daily Last fill date: 11/11/2022. Prescription expired.   Contacted patient to discuss, left voicemail for him to return my call at his convenience.   Will collaborate with PCP to place refill.   Catie IVAR Centers, PharmD, [facility] Clinical Pharmacist [phone]

## 2024-01-22 ENCOUNTER — Ambulatory Visit: Admitting: Podiatry

## 2024-01-25 DIAGNOSIS — Z125 Encounter for screening for malignant neoplasm of prostate: Secondary | ICD-10-CM | POA: Diagnosis not present

## 2024-01-25 DIAGNOSIS — E1169 Type 2 diabetes mellitus with other specified complication: Secondary | ICD-10-CM | POA: Diagnosis not present

## 2024-01-25 DIAGNOSIS — N1831 Chronic kidney disease, stage 3a: Secondary | ICD-10-CM | POA: Diagnosis not present

## 2024-01-26 LAB — CBC WITH DIFFERENTIAL/PLATELET
Basophils Absolute: 0.1 x10E3/uL (ref 0.0–0.2)
Basos: 1 %
EOS (ABSOLUTE): 0.4 x10E3/uL (ref 0.0–0.4)
Eos: 7 %
Hematocrit: 39.3 % (ref 37.5–51.0)
Hemoglobin: 12.8 g/dL — ABNORMAL LOW (ref 13.0–17.7)
Immature Grans (Abs): 0 x10E3/uL (ref 0.0–0.1)
Immature Granulocytes: 0 %
Lymphocytes Absolute: 1.4 x10E3/uL (ref 0.7–3.1)
Lymphs: 27 %
MCH: 29.4 pg (ref 26.6–33.0)
MCHC: 32.6 g/dL (ref 31.5–35.7)
MCV: 90 fL (ref 79–97)
Monocytes Absolute: 0.7 x10E3/uL (ref 0.1–0.9)
Monocytes: 13 %
Neutrophils Absolute: 2.8 x10E3/uL (ref 1.4–7.0)
Neutrophils: 52 %
Platelets: 288 x10E3/uL (ref 150–450)
RBC: 4.36 x10E6/uL (ref 4.14–5.80)
RDW: 13.4 % (ref 11.6–15.4)
WBC: 5.2 x10E3/uL (ref 3.4–10.8)

## 2024-01-26 LAB — BASIC METABOLIC PANEL WITH GFR
BUN/Creatinine Ratio: 14 (ref 10–24)
BUN: 17 mg/dL (ref 8–27)
CO2: 25 mmol/L (ref 20–29)
Calcium: 9.3 mg/dL (ref 8.6–10.2)
Chloride: 99 mmol/L (ref 96–106)
Creatinine, Ser: 1.21 mg/dL (ref 0.76–1.27)
Glucose: 101 mg/dL — ABNORMAL HIGH (ref 70–99)
Potassium: 4.6 mmol/L (ref 3.5–5.2)
Sodium: 137 mmol/L (ref 134–144)
eGFR: 63 mL/min/1.73 (ref >59)

## 2024-01-26 LAB — PSA: Prostate Specific Ag, Serum: 1.6 ng/mL (ref 0.0–4.0)

## 2024-01-26 LAB — HEMOGLOBIN A1C
Est. average glucose Bld gHb Est-mCnc: 134 mg/dL
Hgb A1c MFr Bld: 6.3 % — ABNORMAL HIGH (ref 4.8–5.6)

## 2024-01-29 ENCOUNTER — Encounter: Payer: Self-pay | Admitting: Internal Medicine

## 2024-01-29 ENCOUNTER — Ambulatory Visit (INDEPENDENT_AMBULATORY_CARE_PROVIDER_SITE_OTHER): Admitting: Internal Medicine

## 2024-01-29 VITALS — BP 114/68 | HR 72 | Ht 75.0 in | Wt 201.2 lb

## 2024-01-29 DIAGNOSIS — I110 Hypertensive heart disease with heart failure: Secondary | ICD-10-CM | POA: Diagnosis not present

## 2024-01-29 DIAGNOSIS — E782 Mixed hyperlipidemia: Secondary | ICD-10-CM | POA: Diagnosis not present

## 2024-01-29 DIAGNOSIS — E1169 Type 2 diabetes mellitus with other specified complication: Secondary | ICD-10-CM | POA: Diagnosis not present

## 2024-01-29 DIAGNOSIS — E1151 Type 2 diabetes mellitus with diabetic peripheral angiopathy without gangrene: Secondary | ICD-10-CM

## 2024-01-29 DIAGNOSIS — I5030 Unspecified diastolic (congestive) heart failure: Secondary | ICD-10-CM | POA: Diagnosis not present

## 2024-01-29 DIAGNOSIS — N1831 Chronic kidney disease, stage 3a: Secondary | ICD-10-CM | POA: Diagnosis not present

## 2024-01-29 DIAGNOSIS — E1122 Type 2 diabetes mellitus with diabetic chronic kidney disease: Secondary | ICD-10-CM

## 2024-01-29 DIAGNOSIS — E1159 Type 2 diabetes mellitus with other circulatory complications: Secondary | ICD-10-CM

## 2024-01-29 DIAGNOSIS — I10 Essential (primary) hypertension: Secondary | ICD-10-CM

## 2024-01-29 DIAGNOSIS — I739 Peripheral vascular disease, unspecified: Secondary | ICD-10-CM

## 2024-01-29 NOTE — Progress Notes (Signed)
 Established Patient Office Visit  Subjective:  Patient ID: Albert Morris, male    DOB: Jun 24, 1950  Age: 73 y.o. MRN: [id number]  CC:  Chief Complaint  Patient presents with   Diabetes    4 MONTH F/U    Hypertension    4 MONTH F/U     HPI Albert Morris is a 73 y.o. male with past medical history of HTN, CAD, type II DM with HLD, CVA and tobacco abuse who presents for annual physical.  HTN: BP was wnl today, better than prior. Takes lisinopril  40 mg once daily, Coreg  6.25 mg twice daily and hydralazine  50 mg twice daily regularly. His home BP readings are around 120s/70s at home mostly. Patient denies headache, dizziness, chest pain, dyspnea or palpitations.  CKD: His CMP showed GFR of 63, which is better compared to previous visit.  He is trying to drink adequate hydration.  Denies any dysuria, hematuria, urinary hesitancy or resistance.  HLD: He has been taking Lipitor now.   Type 2 DM: His HbA1C is stable at 6.3. Diet controlled.  He was given Farxiga  considering history of HFpEF, CAD and PAD, but he did not take it considering its side effects.  He was counseled about its potential benefits and his low risk of side effects in the last visit, but does not prefer to take medicine for it. Denies any polyuria or polyphagia.    Past Medical History:  Diagnosis Date   Aortic insufficiency    mild AI with AVSC on echo 10/2022   Cerebellar stroke (HCC) 06/24/2016   Admitted with dizziness and falling to the left 06/24/16. Work up revealed multifocal acute ischemia within the right cerebellar hemisphere with occlusion versus severe stenosis of the right PICA and extensive, multifocal intracranial atherosclerosis with moderate stenoses of multiple distal MCA branches.   Colon polyps    CVA (cerebral vascular accident) (HCC) 06/21/2016   Diabetes mellitus without complication (HCC) 11/11/2018   Hyperlipidemia    Hypertension    Myocardial infarction (HCC)    Pneumonia     Substance abuse (HCC)    hx of EtOH and MJ    Past Surgical History:  Procedure Laterality Date   CAROTID ENDARTERECTOMY Left    COLON SURGERY  [phone]   growth removed, Laparoscopic Vs. open Left hemicolectomy Dr. Devere 09-25-17   COLONOSCOPY N/A 06/26/2017   Procedure: COLONOSCOPY;  Surgeon: Golda Claudis PENNER, MD;  [facility];  Service: Endoscopy;  Laterality: N/A;  730   COLONOSCOPY N/A 12/01/2018   Procedure: COLONOSCOPY;  Surgeon: Golda Claudis PENNER, MD;  [facility];  Service: Endoscopy;  Laterality: N/A;  930   COLONOSCOPY N/A 11/27/2023   Procedure: COLONOSCOPY;  Surgeon: Eartha Angelia Sieving, MD;  [facility];  Service: Gastroenterology;  Laterality: N/A;  9:45am, ASA 1-2   COLONOSCOPY WITH PROPOFOL  N/A 10/07/2017   Procedure: COLONOSCOPY WITH PROPOFOL  WITH TATTOO;  Surgeon: Teresa Lonni HERO, MD;  Location: [facility];  Service: General;  Laterality: N/A;   ENDARTERECTOMY Left 08/01/2016   Procedure: ENDARTERECTOMY LEFT CAROTID;  Surgeon: Eliza Lonni RAMAN, MD;  Location: [facility];  Service: Vascular;  Laterality: Left;   FLEXIBLE SIGMOIDOSCOPY N/A 10/08/2017   Procedure: FLEXIBLE SIGMOIDOSCOPY;  Surgeon: Teresa Lonni HERO, MD;  Location: [facility];  Service: General;  Laterality: N/A;   LAPAROSCOPIC RIGHT HEMI COLECTOMY Left 10/08/2017   Procedure: LAPAROSCOPIC VERSES OPEN LEFT HEMI COLECTOMY ERAS PATHWAY;  Surgeon: Teresa Lonni HERO, MD;  Location: [facility];  Service: General;  Laterality: Left;   PATCH ANGIOPLASTY Left 08/01/2016   Procedure: PATCH ANGIOPLASTY LEFT CAROTID ARTERY USING ZENOSURE BIOLOGIC PATCH;  Surgeon: Eliza Lonni RAMAN, MD;  Location: [facility] OR;  Service: Vascular;  Laterality: Left;   POLYPECTOMY  12/01/2018   Procedure: POLYPECTOMY;  Surgeon: Golda Claudis PENNER, MD;  [facility];  Service: Endoscopy;;   SUBMUCOSAL INJECTION  10/07/2017   Procedure: SUBMUCOSAL INJECTION;  Surgeon: Teresa Lonni HERO,  MD;  Location: [facility];  Service: General;;   TONSILLECTOMY      Family History  Problem Relation Age of Onset   Cancer Mother    Breast cancer Mother        lived to be 77   CVA Mother    CAD Mother    Arthritis Mother    Hypertension Mother    Other Father        gunshot wound   Early death Father        GSW   Cancer Brother        Stomach Cancer   Cancer Brother        Lung Cancer   Stroke Maternal Aunt    Alcohol abuse Maternal Uncle    Stroke Maternal Grandfather    Colon cancer Neg Hx     Social History   Socioeconomic History   Marital status: Divorced    Spouse name: Not on file   Number of children: 2   Years of education: 12   Highest education level: Not on file  Occupational History   Occupation: disabled Cytogeneticist x8 years; 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, [place], [place], [place]    Comment: alcoholic  Tobacco Use   Smoking status: Former    Current packs/day: 0.00    Average packs/day: 0.5 packs/day for 29.0 years (14.5 ttl pk-yrs)    Types: Cigarettes    Start date: 47    Quit date: 2006    Years since quitting: 19.9   Smokeless tobacco: Never  Vaping Use   Vaping status: Never Used  Substance and Sexual Activity   Alcohol use: Not Currently    Comment: quit 2006   Drug use: Not Currently    Comment: remote h/o heavy marijuana use, also used cocaine and others   Sexual activity: Not Currently  Other Topics Concern   Not on file  Social History Narrative   Divorced   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electronics engineer for 8 years, EMT certified      Lives alone   Likes to walk      2 sons -in [place] and [place]   Social Drivers of Health   Tobacco Use: Medium Risk (01/29/2024)   Patient History    Smoking Tobacco Use: Former    Smokeless Tobacco Use: Never    Passive Exposure: Not on Actuary Strain: Low Risk (03/10/2022)   Overall Financial Resource Strain (CARDIA)    Difficulty of Paying Living Expenses: Not hard at all  Food  Insecurity: No Food Insecurity (03/10/2022)   Hunger Vital Sign    Worried About Running Out of Food in the Last Year: Never true    Ran Out of Food in the Last Year: Never true  Transportation Needs: No Transportation Needs (03/10/2022)   PRAPARE - Administrator, Civil Service (Medical): No    Lack of Transportation (Non-Medical): No  Physical Activity: Sufficiently Active (03/10/2022)   Exercise Vital Sign    Days of Exercise  per Week: 7 days    Minutes of Exercise per Session: 30 min  Stress: No Stress Concern Present (03/10/2022)   [employer] of Occupational Health - Occupational Stress Questionnaire    Feeling of Stress : Not at all  Social Connections: Moderately Isolated (03/10/2022)   Social Connection and Isolation Panel    Frequency of Communication with Friends and Family: More than three times a week    Frequency of Social Gatherings with Friends and Family: More than three times a week    Attends Religious Services: More than 4 times per year    Active Member of [employer] or Organizations: No    Attends Banker Meetings: Never    Marital Status: Divorced  Catering Manager Violence: Not At Risk (03/10/2022)   Humiliation, Afraid, Rape, and Kick questionnaire    Fear of Current or Ex-Partner: No    Emotionally Abused: No    Physically Abused: No    Sexually Abused: No  Depression (PHQ2-9): Low Risk (01/29/2024)   Depression (PHQ2-9)    PHQ-2 Score: 0  Alcohol Screen: Low Risk (03/10/2022)   Alcohol Screen    Last Alcohol Screening Score (AUDIT): 0  Housing: Low Risk (03/10/2022)   Housing    Last Housing Risk Score: 0  Utilities: Not At Risk (03/10/2022)   AHC Utilities    Threatened with loss of utilities: No  Health Literacy: Not on file    Outpatient Medications Prior to Visit  Medication Sig Dispense Refill   aspirin  EC 81 MG tablet Take 1 tablet (81 mg total) by mouth daily. 90 tablet 3   atorvastatin  (LIPITOR) 80 MG tablet Take 1  tablet (80 mg total) by mouth daily at 6 PM. 90 tablet 3   carvedilol  (COREG ) 6.25 MG tablet Take 1 tablet (6.25 mg total) by mouth 2 (two) times daily with a meal. 180 tablet 1   Cholecalciferol (VITAMIN D ) 50 MCG (2000 UT) CAPS Take 1 capsule by mouth daily.     clopidogrel  (PLAVIX ) 75 MG tablet TAKE 1 TABLET(75 MG) BY MOUTH DAILY 90 tablet 3   colchicine  0.6 MG tablet TAKE 2 TABLETS BY MOUTH ONCE, THEN TAKE 1 TABLET ONE HOUR LATER. TAKE 1 TABLET DAILY FROM DAY 2. 20 tablet 0   lisinopril  (ZESTRIL ) 40 MG tablet Take 1 tablet (40 mg total) by mouth daily. 90 tablet 3   Vitamin D , Ergocalciferol , 50 MCG (2000 UT) CAPS Take 1 capsule by mouth daily.     No facility-administered medications prior to visit.    Allergies  Allergen Reactions   Blood-Group Specific Substance     No whole blood products   Diclofenac  Sodium-Menthol Gel [Diclofenac  Sodium-Menthol Crm]     Shortness of breath   Prednisone      Elevates blood pressure    ROS Review of Systems  Constitutional:  Negative for chills and fever.  HENT:  Negative for congestion and sore throat.   Eyes:  Negative for pain and discharge.  Respiratory:  Negative for cough and shortness of breath.   Cardiovascular:  Negative for chest pain and palpitations.  Gastrointestinal:  Negative for diarrhea, nausea and vomiting.  Endocrine: Negative for polydipsia and polyuria.  Genitourinary:  Negative for dysuria and hematuria.  Musculoskeletal:  Negative for neck pain and neck stiffness.  Skin:  Negative for rash.  Neurological:  Negative for dizziness, weakness, numbness and headaches.  Psychiatric/Behavioral:  Negative for agitation and behavioral problems.       Objective:  Physical Exam Vitals reviewed.  Constitutional:      General: He is not in acute distress.    Appearance: He is not diaphoretic.  HENT:     Head: Normocephalic and atraumatic.     Nose: Nose normal.     Mouth/Throat:     Mouth: Mucous membranes are  moist.  Eyes:     General: No scleral icterus.    Extraocular Movements: Extraocular movements intact.  Cardiovascular:     Rate and Rhythm: Normal rate and regular rhythm.     Heart sounds: Normal heart sounds. No murmur heard. Pulmonary:     Breath sounds: Normal breath sounds. No wheezing or rales.  Musculoskeletal:     Cervical back: Neck supple. No tenderness.     Right lower leg: No edema.     Left lower leg: No edema.  Skin:    General: Skin is warm.     Findings: Lesion (Has skin tag over neck area) present. No rash.  Neurological:     General: No focal deficit present.     Mental Status: He is alert and oriented to person, place, and time.     Sensory: No sensory deficit.     Motor: No weakness.  Psychiatric:        Mood and Affect: Mood normal.        Behavior: Behavior normal.     BP 114/68   Pulse 72   Ht 6' 3 (1.905 m)   Wt 201 lb 3.2 oz (91.3 kg)   SpO2 96%   BMI 25.15 kg/m  Wt Readings from Last 3 Encounters:  01/29/24 201 lb 3.2 oz (91.3 kg)  11/27/23 200 lb (90.7 kg)  09/30/23 198 lb 12.8 oz (90.2 kg)    Lab Results  Component Value Date   TSH 1.490 09/04/2021   Lab Results  Component Value Date   WBC 5.2 01/25/2024   HGB 12.8 (L) 01/25/2024   HCT 39.3 01/25/2024   MCV 90 01/25/2024   PLT 288 01/25/2024   Lab Results  Component Value Date   NA 137 01/25/2024   K 4.6 01/25/2024   CO2 25 01/25/2024   GLUCOSE 101 (H) 01/25/2024   BUN 17 01/25/2024   CREATININE 1.21 01/25/2024   BILITOT 0.5 09/22/2023   ALKPHOS 73 09/22/2023   AST 29 09/22/2023   ALT 24 09/22/2023   PROT 7.0 09/22/2023   ALBUMIN  4.2 09/22/2023   CALCIUM  9.3 01/25/2024   ANIONGAP 11 10/12/2017   EGFR 63 01/25/2024   Lab Results  Component Value Date   CHOL 137 09/22/2023   Lab Results  Component Value Date   HDL 43 09/22/2023   Lab Results  Component Value Date   LDLCALC 83 09/22/2023   Lab Results  Component Value Date   TRIG 52 09/22/2023   Lab  Results  Component Value Date   CHOLHDL 3.2 09/22/2023   Lab Results  Component Value Date   HGBA1C 6.3 (H) 01/25/2024      Assessment & Plan:   Problem List Items Addressed This Visit       Cardiovascular and Mediastinum   Essential hypertension - Primary   BP Readings from Last 1 Encounters:  01/29/24 114/68   Well-controlled now On lisinopril  40 mg once daily, Coreg  6.25 mg BID and Hydralazine  50 mg BID Due to recurrent episodes of gout, discontinued HCTZ Counseled for compliance with the medications Advised DASH diet and moderate exercise/walking as tolerated  PAD (peripheral artery disease)   Has blackish discoloration of the toes chronically, but warm extremities Has history of CAD, CVA and carotid artery stenosis US  ABI screening test showed ABI index of 0.61 on right leg, likely due to right superficial femoral artery occlusion, had vascular surgery evaluation-recommended conservative treatment Currently on DAPT and statin        Endocrine   Type 2 diabetes mellitus with other specified complication (HCC)   Lab Results  Component Value Date   HGBA1C 6.3 (H) 01/25/2024   Diet controlled Associated with HTN, HLD, PAD, CKD and CHF Had added Farxiga  for HFpEF and CKD, but did not take it despite counseling Advised to follow diabetic diet On statin and ACEi F/u CMP and lipid panel Diabetic eye exam: Advised to follow up with Ophthalmology for diabetic eye exam      Relevant Orders   Basic Metabolic Panel (BMET)   Hemoglobin A1c   Urine Microalbumin w/creat. ratio     Genitourinary   Stage 3a chronic kidney disease (HCC)   CMP reviewed, GFR stays around 55 Likely from HTN, DM and age related decline On lisinopril  for HTN Had added Farxiga  for CKD and HFpEF, but did not take it due to concern for side effects Avoid nephrotoxic agents Advised to improve fluid intake to at least 64 ounces of fluid in a day      Relevant Orders   CBC with  Differential/Platelet   Basic Metabolic Panel (BMET)   Urine Microalbumin w/creat. ratio     Other   Mixed hyperlipidemia   Lipid profile reviewed Continue Lipitor 80 mg every other day (has leg cramps with daily dosing) - needs to take it regularly Did not tolerate Zetia  If persistent elevated LDL, will need Repatha or Praluent - cost can be a concern          No orders of the defined types were placed in this encounter.   Follow-up: Return in about 4 months (around 05/29/2024) for DM and HTN.    Suzzane MARLA Blanch, MD

## 2024-01-29 NOTE — Assessment & Plan Note (Signed)
 BP Readings from Last 1 Encounters:  01/29/24 114/68   Well-controlled now On lisinopril  40 mg once daily, Coreg  6.25 mg BID and Hydralazine  50 mg BID Due to recurrent episodes of gout, discontinued HCTZ Counseled for compliance with the medications Advised DASH diet and moderate exercise/walking as tolerated

## 2024-01-29 NOTE — Assessment & Plan Note (Signed)
Lipid profile reviewed Continue Lipitor 80 mg every other day (has leg cramps with daily dosing) - needs to take it regularly Did not tolerate Zetia If persistent elevated LDL, will need Repatha or Praluent - cost can be a concern

## 2024-01-29 NOTE — Assessment & Plan Note (Signed)
 CMP reviewed, GFR stays around 55 Likely from HTN, DM and age related decline On lisinopril  for HTN Had added Farxiga  for CKD and HFpEF, but did not take it due to concern for side effects Avoid nephrotoxic agents Advised to improve fluid intake to at least 64 ounces of fluid in a day

## 2024-01-29 NOTE — Assessment & Plan Note (Signed)
Has blackish discoloration of the toes chronically, but warm extremities Has history of CAD, CVA and carotid artery stenosis [place] ABI screening test showed ABI index of 0.61 on right leg, likely due to right superficial femoral artery occlusion, had vascular surgery evaluation-recommended conservative treatment Currently on DAPT and statin

## 2024-01-29 NOTE — Patient Instructions (Signed)
Please continue to take medications as prescribed.  Please continue to follow low carb diet and perform moderate exercise/walking at least 150 mins/week.  Please get blood tests done before the next visit.

## 2024-01-29 NOTE — Assessment & Plan Note (Signed)
 Lab Results  Component Value Date   HGBA1C 6.3 (H) 01/25/2024   Diet controlled Associated with HTN, HLD, PAD, CKD and CHF Had added Farxiga  for HFpEF and CKD, but did not take it despite counseling Advised to follow diabetic diet On statin and ACEi F/u CMP and lipid panel Diabetic eye exam: Advised to follow up with Ophthalmology for diabetic eye exam

## 2024-02-10 ENCOUNTER — Encounter: Payer: Self-pay | Admitting: Podiatry

## 2024-02-10 ENCOUNTER — Ambulatory Visit: Admitting: Podiatry

## 2024-02-10 DIAGNOSIS — E119 Type 2 diabetes mellitus without complications: Secondary | ICD-10-CM

## 2024-02-10 DIAGNOSIS — M79674 Pain in right toe(s): Secondary | ICD-10-CM

## 2024-02-10 DIAGNOSIS — B351 Tinea unguium: Secondary | ICD-10-CM | POA: Diagnosis not present

## 2024-02-10 DIAGNOSIS — M79675 Pain in left toe(s): Secondary | ICD-10-CM

## 2024-02-10 DIAGNOSIS — N1831 Chronic kidney disease, stage 3a: Secondary | ICD-10-CM | POA: Diagnosis not present

## 2024-02-10 NOTE — Progress Notes (Signed)
 This patient returns to my office for at risk foot care.  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  Patient is taking plavix .  This patient is unable to cut nails himself since the patient cannot reach his nails.These nails are painful walking and wearing shoes.  Patient says his vascular status is being evaluated.This patient presents for at risk foot care today.  General Appearance  Alert, conversant and in no acute stress.  Vascular  Dorsalis pedis and posterior tibial  pulses are weakly  palpable  bilaterally.  Capillary return is within normal limits  bilaterally. Temperature is within normal limits  bilaterally.  Neurologic  Senn-Weinstein monofilament wire test within normal limits/diminished   bilaterally. Muscle power within normal limits bilaterally.  Nails Thick disfigured discolored nails with subungual debris  from hallux to fifth toes bilaterally. No evidence of bacterial infection or drainage bilaterally.  Orthopedic  No limitations of motion  feet .  No crepitus or effusions noted.  No bony pathology or digital deformities noted. HAV  B/L.  Skin  normotropic skin with no porokeratosis noted bilaterally.  No signs of infections or ulcers noted.   Asymptomatic thickness of skin sub 5th met left foot.  Onychomycosis  Pain in right toes  Pain in left toes  Consent was obtained for treatment procedures.   Mechanical debridement of nails 1-5  bilaterally performed with a nail nipper.  Filed with dremel without incident.    Return office visit   3 months                  Told patient to return for periodic foot care and evaluation due to potential at risk complications.   Cordella Bold DPM tomma

## 2024-03-12 LAB — CBC WITH DIFFERENTIAL/PLATELET
Basophils Absolute: 0 10*3/uL (ref 0.0–0.2)
Basos: 1 %
EOS (ABSOLUTE): 0.3 10*3/uL (ref 0.0–0.4)
Eos: 6 %
Hematocrit: 38.6 % (ref 37.5–51.0)
Hemoglobin: 12.8 g/dL — ABNORMAL LOW (ref 13.0–17.7)
Immature Grans (Abs): 0 10*3/uL (ref 0.0–0.1)
Immature Granulocytes: 0 %
Lymphocytes Absolute: 1.4 10*3/uL (ref 0.7–3.1)
Lymphs: 27 %
MCH: 30 pg (ref 26.6–33.0)
MCHC: 33.2 g/dL (ref 31.5–35.7)
MCV: 90 fL (ref 79–97)
Monocytes Absolute: 0.5 10*3/uL (ref 0.1–0.9)
Monocytes: 10 %
Neutrophils Absolute: 2.9 10*3/uL (ref 1.4–7.0)
Neutrophils: 56 %
Platelets: 288 10*3/uL (ref 150–450)
RBC: 4.27 x10E6/uL (ref 4.14–5.80)
RDW: 13.2 % (ref 11.6–15.4)
WBC: 5.1 10*3/uL (ref 3.4–10.8)

## 2024-03-12 LAB — HEMOGLOBIN A1C
Est. average glucose Bld gHb Est-mCnc: 131 mg/dL
Hgb A1c MFr Bld: 6.2 % — ABNORMAL HIGH (ref 4.8–5.6)

## 2024-03-12 LAB — MICROALBUMIN / CREATININE URINE RATIO
Creatinine, Urine: 56.9 mg/dL
Microalb/Creat Ratio: 10 mg/g{creat} (ref 0–29)
Microalbumin, Urine: 5.7 ug/mL

## 2024-03-12 LAB — BASIC METABOLIC PANEL WITH GFR
BUN/Creatinine Ratio: 18 (ref 10–24)
BUN: 20 mg/dL (ref 8–27)
CO2: 24 mmol/L (ref 20–29)
Calcium: 9.6 mg/dL (ref 8.6–10.2)
Chloride: 99 mmol/L (ref 96–106)
Creatinine, Ser: 1.1 mg/dL (ref 0.76–1.27)
Glucose: 93 mg/dL (ref 70–99)
Potassium: 4.7 mmol/L (ref 3.5–5.2)
Sodium: 137 mmol/L (ref 134–144)
eGFR: 70 mL/min/{1.73_m2}

## 2024-03-18 ENCOUNTER — Other Ambulatory Visit: Payer: Self-pay

## 2024-03-18 ENCOUNTER — Ambulatory Visit: Payer: Self-pay | Admitting: Internal Medicine

## 2024-03-18 ENCOUNTER — Telehealth: Payer: Self-pay | Admitting: Internal Medicine

## 2024-03-18 DIAGNOSIS — I10 Essential (primary) hypertension: Secondary | ICD-10-CM

## 2024-03-18 MED ORDER — CARVEDILOL 6.25 MG PO TABS: 6.2500 mg | ORAL_TABLET | Freq: Two times a day (BID) | ORAL | 1 refills | Status: AC

## 2024-03-18 MED ORDER — CARVEDILOL 6.25 MG PO TABS: 6.2500 mg | ORAL_TABLET | Freq: Two times a day (BID) | ORAL | 1 refills | Status: DC

## 2024-03-18 NOTE — Telephone Encounter (Signed)
 Prescription Request  03/18/2024  LOV: 01/29/2024  What is the name of the medication or equipment? Carvedilol  6.25 mg  Have you contacted your pharmacy to request a refill? No   Which pharmacy would you like this sent to?  [address] [facility] [place]  Patient notified that their request is being sent to the clinical staff for review and that they should receive a response within 2 business days.   Please advise at walked in

## 2024-03-18 NOTE — Telephone Encounter (Signed)
 Rx sent.

## 2024-03-21 ENCOUNTER — Other Ambulatory Visit: Payer: Self-pay | Admitting: Internal Medicine

## 2024-03-21 DIAGNOSIS — Z8673 Personal history of transient ischemic attack (TIA), and cerebral infarction without residual deficits: Secondary | ICD-10-CM

## 2024-03-22 ENCOUNTER — Ambulatory Visit: Payer: Medicare HMO

## 2024-03-23 ENCOUNTER — Encounter: Payer: Self-pay | Admitting: Student

## 2024-03-23 ENCOUNTER — Telehealth: Payer: Self-pay | Admitting: Internal Medicine

## 2024-03-23 ENCOUNTER — Ambulatory Visit: Admitting: Student

## 2024-03-23 VITALS — BP 128/76 | HR 67 | Ht 75.0 in | Wt 204.6 lb

## 2024-03-23 DIAGNOSIS — E785 Hyperlipidemia, unspecified: Secondary | ICD-10-CM

## 2024-03-23 DIAGNOSIS — I6523 Occlusion and stenosis of bilateral carotid arteries: Secondary | ICD-10-CM | POA: Diagnosis not present

## 2024-03-23 DIAGNOSIS — I10 Essential (primary) hypertension: Secondary | ICD-10-CM

## 2024-03-23 DIAGNOSIS — I5020 Unspecified systolic (congestive) heart failure: Secondary | ICD-10-CM | POA: Diagnosis not present

## 2024-03-23 MED ORDER — LISINOPRIL 40 MG PO TABS: 40.0000 mg | ORAL_TABLET | Freq: Every day | ORAL | 3 refills | Status: AC

## 2024-03-23 NOTE — Telephone Encounter (Signed)
 Prescription Request  03/23/2024  LOV: 01/29/2024  What is the name of the medication or equipment? carvedilol  (COREG ) 6.25 MG tablet [[id number]]   Have you contacted your pharmacy to request a refill? No   Which pharmacy would you like this sent to?  [facility] [address] [place]   Patient notified that their request is being sent to the clinical staff for review and that they should receive a response within 2 business days.   Please advise at walk in office

## 2024-03-23 NOTE — Patient Instructions (Signed)
 Medication Instructions:  Your physician recommends that you continue on your current medications as directed. Please refer to the Current Medication list given to you today.  *If you need a refill on your cardiac medications before your next appointment, please call your pharmacy*  Lab Work: NONE   If you have labs (blood work) drawn today and your tests are completely normal, you will receive your results only by: MyChart Message (if you have MyChart) OR A paper copy in the mail If you have any lab test that is abnormal or we need to change your treatment, we will call you to review the results.  Testing/Procedures: NONE   Follow-Up: At [facility], you and your health needs are our priority.  As part of our continuing mission to provide you with exceptional heart care, our providers are all part of one team.  This team includes your primary Cardiologist (physician) and Advanced Practice Providers or APPs (Physician Assistants and Nurse Practitioners) who all work together to provide you with the care you need, when you need it.  Your next appointment:   1 year(s)  Provider:   You may see Ola Berger, MD or one of the following Advanced Practice Providers on your designated Care Team:   Woodfin Hays, PA-C  [place], [place] Theotis Flake, [place]     We recommend signing up for the patient portal called "MyChart".  Sign up information is provided on this After Visit Summary.  MyChart is used to connect with patients for Virtual Visits (Telemedicine).  Patients are able to view lab/test results, encounter notes, upcoming appointments, etc.  Non-urgent messages can be sent to your provider as well.   To learn more about what you can do with MyChart, go to [email or web address].   Other Instructions Thank you for choosing [facility]!

## 2024-03-23 NOTE — Telephone Encounter (Signed)
 Refills sent 03/18/2024

## 2024-03-25 ENCOUNTER — Ambulatory Visit

## 2024-03-25 VITALS — BP 138/70 | HR 69 | Resp 12 | Ht 75.0 in | Wt 204.8 lb

## 2024-03-25 DIAGNOSIS — Z2821 Immunization not carried out because of patient refusal: Secondary | ICD-10-CM

## 2024-03-25 DIAGNOSIS — Z0000 Encounter for general adult medical examination without abnormal findings: Secondary | ICD-10-CM

## 2024-03-25 NOTE — Patient Instructions (Signed)
 [name],  Thank you for taking the time for your Medicare Wellness Visit. I appreciate your continued commitment to your health goals. Please review the care plan we discussed, and feel free to reach out if I can assist you further.  Please note that Annual Wellness Visits do not include a physical exam. Some assessments may be limited, especially if the visit was conducted virtually. If needed, we may recommend an in-person follow-up with your provider.  Ongoing Care Seeing your primary care provider every 3 to 6 months helps us  monitor your health and provide consistent, personalized care.   Aim for 30 minutes of exercise or brisk walking, 6-8 glasses of water , and 5 servings of fruits and vegetables each day.  Referrals If a referral was made during today's visit and you haven't received any updates within two weeks, please contact the referred provider directly to check on the status.  Recommended Screenings:  Health Maintenance  Topic Date Due   DTaP/Tdap/Td vaccine (1 - Tdap) Never done   Zoster (Shingles) Vaccine (1 of 2) Never done   COVID-19 Vaccine (1 - 2025-26 season) Never done   Medicare Annual Wellness Visit  03/17/2024   Flu Shot  05/17/2024*   Eye exam for diabetics  09/06/2024   Hemoglobin A1C  09/07/2024   Complete foot exam   01/28/2025   Yearly kidney function blood test for diabetes  03/10/2025   Kidney health urinalysis for diabetes  03/10/2025   Colon Cancer Screening  11/26/2028   Hepatitis C Screening  Completed   Meningitis B Vaccine  Aged Out   Pneumococcal Vaccine for age over 74  Discontinued  *Topic was postponed. The date shown is not the original due date.       03/25/2024    1:14 PM  Advanced Directives  Does Patient Have a Medical Advance Directive? Yes    Vision: Annual vision screenings are recommended for early detection of glaucoma, cataracts, and diabetic retinopathy. These exams can also reveal signs of chronic conditions such as  diabetes and high blood pressure.  Dental: Annual dental screenings help detect early signs of oral cancer, gum disease, and other conditions linked to overall health, including heart disease and diabetes.  Please see the attached documents for additional preventive care recommendations.

## 2024-03-25 NOTE — Progress Notes (Signed)
 "  No voiced or noted concerns at this time. Vaccines not given: Flu declined today Next appointment with provider in April Next AWVS scheduled for 1 year  Chief Complaint  Patient presents with   Medicare Wellness     Subjective:   Albert Morris is a 74 y.o. male who presents for a Medicare Annual Wellness Visit.  Visit info / Clinical Intake: Medicare Wellness Visit Type:: Subsequent Annual Wellness Visit Persons participating in visit and providing information:: patient Medicare Wellness Visit Mode:: In-person (required for WTM) Interpreter Needed?: No Pre-visit prep was completed: yes AWV questionnaire completed by patient prior to visit?: no Living arrangements:: (!) lives alone Patient's Overall Health Status Rating: very good Typical amount of pain: none Does pain affect daily life?: no Are you currently prescribed opioids?: no  Dietary Habits and Nutritional Risks How many meals a day?: 3 Eats fruit and vegetables daily?: yes Most meals are obtained by: preparing own meals In the last 2 weeks, have you had any of the following?: none Diabetic:: (!) yes Any non-healing wounds?: no How often do you check your BS?: 0 Would you like to be referred to a Nutritionist or for Diabetic Management? : no  Functional Status Activities of Daily Living (to include ambulation/medication): Independent Ambulation: Independent Medication Administration: Independent Home Management (perform basic housework or laundry): Independent Manage your own finances?: yes Primary transportation is: driving Concerns about vision?: no *vision screening is required for WTM* Concerns about hearing?: no  Fall Screening Falls in the past year?: 0 Number of falls in past year: 0 Was there an injury with Fall?: 0 Fall Risk Category Calculator: 0 Patient Fall Risk Level: Low Fall Risk  Fall Risk Patient at Risk for Falls Due to: No Fall Risks Fall risk Follow up: Falls evaluation  completed; Education provided; Falls prevention discussed  Home and Transportation Safety: All rugs have non-skid backing?: N/A, no rugs All stairs or steps have railings?: yes Grab bars in the bathtub or shower?: yes Have non-skid surface in bathtub or shower?: yes Good home lighting?: yes Regular seat belt use?: yes Hospital stays in the last year:: no  Cognitive Assessment Difficulty concentrating, remembering, or making decisions? : no Will 6CIT or Mini Cog be Completed: yes What year is it?: 0 points What month is it?: 0 points Give patient an address phrase to remember (5 components): [address] [place] About what time is it?: 0 points Count backwards from 20 to 1: 0 points Say the months of the year in reverse: 0 points Repeat the address phrase from earlier: 0 points 6 CIT Score: 0 points  Advance Directives (For Healthcare) Does Patient Have a Medical Advance Directive?: Yes Type of Advance Directive: Healthcare Power of [place]; Living will  Reviewed/Updated  Reviewed/Updated: Reviewed All (Medical, Surgical, Family, Medications, Allergies, Care Teams, Patient Goals)    Allergies (verified) Blood-group specific substance, Diclofenac  sodium-menthol gel [diclofenac  sodium-menthol crm], and Prednisone    Current Medications (verified) Outpatient Encounter Medications as of 03/25/2024  Medication Sig   aspirin  EC 81 MG tablet Take 1 tablet (81 mg total) by mouth daily.   atorvastatin  (LIPITOR) 80 MG tablet Take 1 tablet (80 mg total) by mouth daily at 6 PM.   carvedilol  (COREG ) 6.25 MG tablet Take 1 tablet (6.25 mg total) by mouth 2 (two) times daily with a meal.   Cholecalciferol (VITAMIN D ) 50 MCG (2000 UT) CAPS Take 1 capsule by mouth daily.   clopidogrel  (PLAVIX ) 75 MG tablet TAKE 1  TABLET(75 MG) BY MOUTH DAILY   colchicine  0.6 MG tablet TAKE 2 TABLETS BY MOUTH ONCE, THEN TAKE 1 TABLET ONE HOUR LATER. TAKE 1 TABLET DAILY FROM DAY 2.   lisinopril  (ZESTRIL ) 40  MG tablet Take 1 tablet (40 mg total) by mouth daily.   Vitamin D , Ergocalciferol , 50 MCG (2000 UT) CAPS Take 1 capsule by mouth daily.   No facility-administered encounter medications on file as of 03/25/2024.    History: Past Medical History:  Diagnosis Date   Aortic insufficiency    mild AI with AVSC on echo 10/2022   Cerebellar stroke (HCC) 06/24/2016   Admitted with dizziness and falling to the left 06/24/16. Work up revealed multifocal acute ischemia within the right cerebellar hemisphere with occlusion versus severe stenosis of the right PICA and extensive, multifocal intracranial atherosclerosis with moderate stenoses of multiple distal MCA branches.   Colon polyps    CVA (cerebral vascular accident) (HCC) 06/21/2016   Diabetes mellitus without complication (HCC) 11/11/2018   Hyperlipidemia    Hypertension    Myocardial infarction (HCC)    Pneumonia    Substance abuse (HCC)    hx of EtOH and MJ   Past Surgical History:  Procedure Laterality Date   CAROTID ENDARTERECTOMY Left    COLON SURGERY  [phone]   growth removed, Laparoscopic Vs. open Left hemicolectomy Dr. Devere 09-25-17   COLONOSCOPY N/A 06/26/2017   Procedure: COLONOSCOPY;  Surgeon: Golda Claudis PENNER, MD;  Location: [facility];  Service: Endoscopy;  Laterality: N/A;  730   COLONOSCOPY N/A 12/01/2018   Procedure: COLONOSCOPY;  Surgeon: Golda Claudis PENNER, MD;  Location: [facility];  Service: Endoscopy;  Laterality: N/A;  930   COLONOSCOPY N/A 11/27/2023   Procedure: COLONOSCOPY;  Surgeon: Eartha Angelia Sieving, MD;  Location: [facility];  Service: Gastroenterology;  Laterality: N/A;  9:45am, ASA 1-2   COLONOSCOPY WITH PROPOFOL  N/A 10/07/2017   Procedure: COLONOSCOPY WITH PROPOFOL  WITH TATTOO;  Surgeon: Teresa Lonni HERO, MD;  Location: [facility];  Service: General;  Laterality: N/A;   ENDARTERECTOMY Left 08/01/2016   Procedure: ENDARTERECTOMY LEFT CAROTID;  Surgeon: Eliza Lonni RAMAN, MD;  Location:  [facility] OR;  Service: Vascular;  Laterality: Left;   FLEXIBLE SIGMOIDOSCOPY N/A 10/08/2017   Procedure: FLEXIBLE SIGMOIDOSCOPY;  Surgeon: Teresa Lonni HERO, MD;  Location: [facility];  Service: General;  Laterality: N/A;   LAPAROSCOPIC RIGHT HEMI COLECTOMY Left 10/08/2017   Procedure: LAPAROSCOPIC VERSES OPEN LEFT HEMI COLECTOMY ERAS PATHWAY;  Surgeon: Teresa Lonni HERO, MD;  Location: [facility];  Service: General;  Laterality: Left;   PATCH ANGIOPLASTY Left 08/01/2016   Procedure: PATCH ANGIOPLASTY LEFT CAROTID ARTERY USING ZENOSURE BIOLOGIC PATCH;  Surgeon: Eliza Lonni RAMAN, MD;  Location: [facility];  Service: Vascular;  Laterality: Left;   POLYPECTOMY  12/01/2018   Procedure: POLYPECTOMY;  Surgeon: Golda Claudis PENNER, MD;  Location: [facility];  Service: Endoscopy;;   SUBMUCOSAL INJECTION  10/07/2017   Procedure: SUBMUCOSAL INJECTION;  Surgeon: Teresa Lonni HERO, MD;  Location: [facility];  Service: General;;   TONSILLECTOMY     Family History  Problem Relation Age of Onset   Cancer Mother    Breast cancer Mother        lived to be 41   CVA Mother    CAD Mother    Arthritis Mother    Hypertension Mother    Other Father        gunshot wound   Early death Father  GSW   Cancer Brother        Stomach Cancer   Cancer Brother        Lung Cancer   Stroke Maternal Aunt    Alcohol abuse Maternal Uncle    Stroke Maternal Grandfather    Colon cancer Neg Hx    Social History   Occupational History   Occupation: disabled Cytogeneticist x8 years; 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, [place], [place]    Comment: alcoholic  Tobacco Use   Smoking status: Former    Current packs/day: 0.00    Average packs/day: 0.5 packs/day for 29.0 years (14.5 ttl pk-yrs)    Types: Cigarettes    Start date: 72    Quit date: 2006    Years since quitting: 20.1   Smokeless tobacco: Never  Vaping Use   Vaping status: Never Used  Substance and Sexual Activity   Alcohol use: Not Currently     Comment: quit 2006   Drug use: Not Currently    Comment: remote h/o heavy marijuana use, also used cocaine and others   Sexual activity: Not Currently   Tobacco Counseling Counseling given: Not Answered  SDOH Screenings   Food Insecurity: No Food Insecurity (03/25/2024)  Housing: Low Risk (03/25/2024)  Transportation Needs: No Transportation Needs (03/25/2024)  Utilities: Not At Risk (03/25/2024)  Alcohol Screen: Low Risk (03/10/2022)  Depression (PHQ2-9): Low Risk (03/25/2024)  Financial Resource Strain: Low Risk (03/10/2022)  Physical Activity: Sufficiently Active (03/25/2024)  Social Connections: Moderately Isolated (03/25/2024)  Stress: No Stress Concern Present (03/25/2024)  Tobacco Use: Medium Risk (03/25/2024)  Health Literacy: Adequate Health Literacy (03/25/2024)   See flowsheets for full screening details  Depression Screen PHQ 2 & 9 Depression Scale- Over the past 2 weeks, how often have you been bothered by any of the following problems? Little interest or pleasure in doing things: 0 Feeling down, depressed, or hopeless (PHQ Adolescent also includes...irritable): 0 PHQ-2 Total Score: 0 Trouble falling or staying asleep, or sleeping too much: 0 Feeling tired or having little energy: 0 Poor appetite or overeating (PHQ Adolescent also includes...weight loss): 0 Feeling bad about yourself - or that you are a failure or have let yourself or your family down: 0 Trouble concentrating on things, such as reading the newspaper or watching television (PHQ Adolescent also includes...like school work): 0 Moving or speaking so slowly that other people could have noticed. Or the opposite - being so fidgety or restless that you have been moving around a lot more than usual: 0 Thoughts that you would be better off dead, or of hurting yourself in some way: 0 PHQ-9 Total Score: 0 If you checked off any problems, how difficult have these problems made it for you to do your work, take care of things at  home, or get along with other people?: Not difficult at all  Depression Treatment Depression Interventions/Treatment : EYV7-0 Score <4 Follow-up Not Indicated     Goals Addressed             This Visit's Progress    Patient Stated   On track    Remain active and healthy             Objective:    There were no vitals filed for this visit. There is no height or weight on file to calculate BMI.  Hearing/Vision screen Hearing Screening - Comments:: Patient denies any hearing difficulties.   Vision Screening - Comments:: Patient is up to date on yearly eye  exams with Oneil Kawasaki  Immunizations and Health Maintenance Health Maintenance  Topic Date Due   DTaP/Tdap/Td (1 - Tdap) Never done   Zoster Vaccines- Shingrix (1 of 2) Never done   COVID-19 Vaccine (1 - 2025-26 season) Never done   Influenza Vaccine  05/17/2024 (Originally 09/18/2023)   OPHTHALMOLOGY EXAM  09/06/2024   HEMOGLOBIN A1C  09/07/2024   FOOT EXAM  01/28/2025   Diabetic kidney evaluation - eGFR measurement  03/10/2025   Diabetic kidney evaluation - Urine ACR  03/10/2025   Medicare Annual Wellness (AWV)  03/25/2025   Colonoscopy  11/26/2028   Hepatitis C Screening  Completed   Meningococcal B Vaccine  Aged Out   Pneumococcal Vaccine: 50+ Years  Discontinued        Assessment/Plan:  This is a routine wellness examination for Areeb.  Patient Care Team: Tobie Suzzane POUR, MD as PCP - General (Internal Medicine) Loreda Hacker, DPM as Consulting Physician (Podiatry) Kawasaki Oneil, DO (Optometry) Okey Vina GAILS, MD as Consulting Physician (Cardiology)  I have personally reviewed and noted the following in the patients chart:   Medical and social history Use of alcohol, tobacco or illicit drugs  Current medications and supplements including opioid prescriptions. Functional ability and status Nutritional status Physical activity Advanced directives List of other physicians Hospitalizations,  surgeries, and ER visits in previous 12 months Vitals Screenings to include cognitive, depression, and falls Referrals and appointments  No orders of the defined types were placed in this encounter.  In addition, I have reviewed and discussed with patient certain preventive protocols, quality metrics, and best practice recommendations. A written personalized care plan for preventive services as well as general preventive health recommendations were provided to patient.   Emett Stapel, CMA   03/25/2024   Return March 27, 2025 at 10:40 am, for In office Medicare Well Visit w  Wellness Nurse.  After Visit Summary: (In Person-Printed) AVS printed and given to the patient    "

## 2024-05-11 ENCOUNTER — Ambulatory Visit: Admitting: Podiatry

## 2024-05-17 ENCOUNTER — Encounter

## 2024-05-17 ENCOUNTER — Ambulatory Visit

## 2024-06-03 ENCOUNTER — Ambulatory Visit: Payer: Self-pay | Admitting: Internal Medicine

## 2025-03-27 ENCOUNTER — Ambulatory Visit: Payer: Self-pay
